# Patient Record
Sex: Female | Born: 1940 | Race: White | Hispanic: No | Marital: Married | Smoking: Former smoker
Health system: Southern US, Community
[De-identification: ages and names within clinical notes are randomized; demographics above are authoritative.]

## PROBLEM LIST (undated history)

## (undated) DIAGNOSIS — I5032 Chronic diastolic (congestive) heart failure: Secondary | ICD-10-CM

## (undated) DIAGNOSIS — K5730 Diverticulosis of large intestine without perforation or abscess without bleeding: Secondary | ICD-10-CM

## (undated) DIAGNOSIS — C50212 Malignant neoplasm of upper-inner quadrant of left female breast: Secondary | ICD-10-CM

## (undated) DIAGNOSIS — I607 Nontraumatic subarachnoid hemorrhage from unspecified intracranial artery: Secondary | ICD-10-CM

## (undated) DIAGNOSIS — I421 Obstructive hypertrophic cardiomyopathy: Secondary | ICD-10-CM

## (undated) DIAGNOSIS — F3289 Other specified depressive episodes: Secondary | ICD-10-CM

## (undated) DIAGNOSIS — C50919 Malignant neoplasm of unspecified site of unspecified female breast: Secondary | ICD-10-CM

## (undated) DIAGNOSIS — F411 Generalized anxiety disorder: Secondary | ICD-10-CM

## (undated) DIAGNOSIS — E782 Mixed hyperlipidemia: Secondary | ICD-10-CM

## (undated) DIAGNOSIS — E119 Type 2 diabetes mellitus without complications: Secondary | ICD-10-CM

## (undated) DIAGNOSIS — N183 Chronic kidney disease, stage 3 unspecified: Secondary | ICD-10-CM

## (undated) DIAGNOSIS — E039 Hypothyroidism, unspecified: Secondary | ICD-10-CM

## (undated) DIAGNOSIS — I671 Cerebral aneurysm, nonruptured: Secondary | ICD-10-CM

## (undated) DIAGNOSIS — K760 Fatty (change of) liver, not elsewhere classified: Secondary | ICD-10-CM

## (undated) DIAGNOSIS — N281 Cyst of kidney, acquired: Secondary | ICD-10-CM

## (undated) DIAGNOSIS — G4733 Obstructive sleep apnea (adult) (pediatric): Secondary | ICD-10-CM

## (undated) DIAGNOSIS — IMO0002 Reserved for concepts with insufficient information to code with codable children: Secondary | ICD-10-CM

## (undated) DIAGNOSIS — E0500 Thyrotoxicosis with diffuse goiter without thyrotoxic crisis or storm: Secondary | ICD-10-CM

## (undated) DIAGNOSIS — R609 Edema, unspecified: Secondary | ICD-10-CM

## (undated) DIAGNOSIS — Z86010 Personal history of colonic polyps: Secondary | ICD-10-CM

## (undated) DIAGNOSIS — R011 Cardiac murmur, unspecified: Secondary | ICD-10-CM

## (undated) DIAGNOSIS — G4730 Sleep apnea, unspecified: Secondary | ICD-10-CM

## (undated) DIAGNOSIS — F329 Major depressive disorder, single episode, unspecified: Secondary | ICD-10-CM

## (undated) DIAGNOSIS — R635 Abnormal weight gain: Secondary | ICD-10-CM

## (undated) DIAGNOSIS — K3189 Other diseases of stomach and duodenum: Secondary | ICD-10-CM

## (undated) DIAGNOSIS — C649 Malignant neoplasm of unspecified kidney, except renal pelvis: Secondary | ICD-10-CM

## (undated) DIAGNOSIS — I10 Essential (primary) hypertension: Secondary | ICD-10-CM

## (undated) DIAGNOSIS — M7120 Synovial cyst of popliteal space [Baker], unspecified knee: Secondary | ICD-10-CM

## (undated) HISTORY — DX: Malignant neoplasm of unspecified kidney, except renal pelvis: C64.9

## (undated) HISTORY — DX: Chronic kidney disease, stage 3 unspecified: N18.30

## (undated) HISTORY — DX: Cyst of kidney, acquired: N28.1

## (undated) HISTORY — DX: Type 2 diabetes mellitus without complications: E11.9

## (undated) HISTORY — PX: CORONARY STENT PLACEMENT: SHX1402

## (undated) HISTORY — PX: BREAST LUMPECTOMY: SHX2

## (undated) HISTORY — DX: Obstructive hypertrophic cardiomyopathy: I42.1

## (undated) HISTORY — PX: ANEURYSM COILING: SHX5349

## (undated) HISTORY — DX: Cardiac murmur, unspecified: R01.1

## (undated) HISTORY — DX: Diverticulosis of large intestine without perforation or abscess without bleeding: K57.30

## (undated) HISTORY — DX: Thyrotoxicosis with diffuse goiter without thyrotoxic crisis or storm: E05.00

## (undated) HISTORY — DX: Mixed hyperlipidemia: E78.2

## (undated) HISTORY — DX: Nontraumatic subarachnoid hemorrhage from unspecified intracranial artery: I60.7

## (undated) HISTORY — DX: Major depressive disorder, single episode, unspecified: F32.9

## (undated) HISTORY — DX: Other specified depressive episodes: F32.89

## (undated) HISTORY — DX: Abnormal weight gain: R63.5

## (undated) HISTORY — PX: COLONOSCOPY: SHX174

## (undated) HISTORY — PX: PARTIAL NEPHRECTOMY: SHX414

## (undated) HISTORY — PX: HEMIARTHROPLASTY SHOULDER FRACTURE: SUR653

## (undated) HISTORY — DX: Malignant neoplasm of unspecified site of unspecified female breast: C50.919

## (undated) HISTORY — DX: Edema, unspecified: R60.9

## (undated) HISTORY — DX: Personal history of colonic polyps: Z86.010

## (undated) HISTORY — DX: Essential (primary) hypertension: I10

## (undated) HISTORY — DX: Generalized anxiety disorder: F41.1

## (undated) HISTORY — DX: Hypothyroidism, unspecified: E03.9

## (undated) HISTORY — PX: SHOULDER SURGERY: SHX246

## (undated) HISTORY — DX: Fatty (change of) liver, not elsewhere classified: K76.0

## (undated) HISTORY — DX: Cerebral aneurysm, nonruptured: I67.1

## (undated) HISTORY — DX: Synovial cyst of popliteal space (Baker), unspecified knee: M71.20

## (undated) HISTORY — DX: Chronic kidney disease, stage 3 (moderate): N18.3

## (undated) HISTORY — DX: Other diseases of stomach and duodenum: K31.89

## (undated) HISTORY — DX: Sleep apnea, unspecified: G47.30

## (undated) HISTORY — DX: Chronic diastolic (congestive) heart failure: I50.32

## (undated) HISTORY — DX: Obstructive sleep apnea (adult) (pediatric): G47.33

## (undated) HISTORY — PX: CATARACT EXTRACTION: SUR2

---

## 1998-09-26 ENCOUNTER — Other Ambulatory Visit: Admission: RE | Admit: 1998-09-26 | Discharge: 1998-09-26 | Payer: Self-pay | Admitting: Obstetrics and Gynecology

## 1999-12-10 DIAGNOSIS — C50919 Malignant neoplasm of unspecified site of unspecified female breast: Secondary | ICD-10-CM

## 1999-12-10 DIAGNOSIS — IMO0001 Reserved for inherently not codable concepts without codable children: Secondary | ICD-10-CM

## 1999-12-10 HISTORY — DX: Malignant neoplasm of unspecified site of unspecified female breast: C50.919

## 1999-12-10 HISTORY — DX: Reserved for inherently not codable concepts without codable children: IMO0001

## 1999-12-10 HISTORY — PX: BREAST EXCISIONAL BIOPSY: SUR124

## 2000-08-22 ENCOUNTER — Encounter: Payer: Self-pay | Admitting: Surgery

## 2000-08-22 ENCOUNTER — Encounter: Admission: RE | Admit: 2000-08-22 | Discharge: 2000-08-22 | Payer: Self-pay | Admitting: Surgery

## 2000-08-25 ENCOUNTER — Encounter: Admission: RE | Admit: 2000-08-25 | Discharge: 2000-11-23 | Payer: Self-pay | Admitting: Radiation Oncology

## 2000-08-28 ENCOUNTER — Encounter (INDEPENDENT_AMBULATORY_CARE_PROVIDER_SITE_OTHER): Payer: Self-pay | Admitting: *Deleted

## 2000-08-28 ENCOUNTER — Encounter: Payer: Self-pay | Admitting: Surgery

## 2000-08-28 ENCOUNTER — Ambulatory Visit (HOSPITAL_COMMUNITY): Admission: RE | Admit: 2000-08-28 | Discharge: 2000-08-28 | Payer: Self-pay | Admitting: Surgery

## 2001-03-13 ENCOUNTER — Encounter: Admission: RE | Admit: 2001-03-13 | Discharge: 2001-06-11 | Payer: Self-pay | Admitting: Radiation Oncology

## 2001-06-16 ENCOUNTER — Ambulatory Visit (HOSPITAL_COMMUNITY): Admission: RE | Admit: 2001-06-16 | Discharge: 2001-06-16 | Payer: Self-pay | Admitting: Radiation Oncology

## 2001-06-16 ENCOUNTER — Encounter: Payer: Self-pay | Admitting: Hematology & Oncology

## 2001-10-12 ENCOUNTER — Encounter: Payer: Self-pay | Admitting: Hematology & Oncology

## 2001-10-12 ENCOUNTER — Encounter: Admission: RE | Admit: 2001-10-12 | Discharge: 2001-10-12 | Payer: Self-pay | Admitting: Hematology & Oncology

## 2002-10-22 DIAGNOSIS — Z860100 Personal history of colon polyps, unspecified: Secondary | ICD-10-CM

## 2002-10-22 HISTORY — DX: Personal history of colon polyps, unspecified: Z86.0100

## 2002-10-22 HISTORY — DX: Personal history of colonic polyps: Z86.010

## 2002-10-24 ENCOUNTER — Inpatient Hospital Stay (HOSPITAL_COMMUNITY): Admission: EM | Admit: 2002-10-24 | Discharge: 2002-10-26 | Payer: Self-pay | Admitting: Emergency Medicine

## 2002-11-01 ENCOUNTER — Encounter: Admission: RE | Admit: 2002-11-01 | Discharge: 2002-11-01 | Payer: Self-pay | Admitting: Hematology & Oncology

## 2002-11-01 ENCOUNTER — Encounter: Payer: Self-pay | Admitting: Hematology & Oncology

## 2006-12-09 DIAGNOSIS — I671 Cerebral aneurysm, nonruptured: Secondary | ICD-10-CM

## 2006-12-09 DIAGNOSIS — I607 Nontraumatic subarachnoid hemorrhage from unspecified intracranial artery: Secondary | ICD-10-CM

## 2006-12-09 DIAGNOSIS — C649 Malignant neoplasm of unspecified kidney, except renal pelvis: Secondary | ICD-10-CM

## 2006-12-09 HISTORY — DX: Malignant neoplasm of unspecified kidney, except renal pelvis: C64.9

## 2006-12-09 HISTORY — DX: Nontraumatic subarachnoid hemorrhage from unspecified intracranial artery: I60.7

## 2006-12-09 HISTORY — DX: Cerebral aneurysm, nonruptured: I67.1

## 2008-08-17 HISTORY — PX: CSF SHUNT: SHX92

## 2009-04-28 ENCOUNTER — Ambulatory Visit: Payer: Self-pay | Admitting: Hematology & Oncology

## 2009-05-13 ENCOUNTER — Emergency Department (HOSPITAL_COMMUNITY): Admission: EM | Admit: 2009-05-13 | Discharge: 2009-05-13 | Payer: Self-pay | Admitting: Emergency Medicine

## 2009-05-22 LAB — CBC WITH DIFFERENTIAL (CANCER CENTER ONLY)
BASO#: 0 10*3/uL (ref 0.0–0.2)
BASO%: 0.5 % (ref 0.0–2.0)
EOS%: 2.3 % (ref 0.0–7.0)
HGB: 11.9 g/dL (ref 11.6–15.9)
MCH: 30.4 pg (ref 26.0–34.0)
MCHC: 33.5 g/dL (ref 32.0–36.0)
MONO%: 6.7 % (ref 0.0–13.0)
NEUT#: 5.7 10*3/uL (ref 1.5–6.5)
RDW: 12.6 % (ref 10.5–14.6)

## 2009-05-23 LAB — COMPREHENSIVE METABOLIC PANEL
Alkaline Phosphatase: 83 U/L (ref 39–117)
BUN: 24 mg/dL — ABNORMAL HIGH (ref 6–23)
Creatinine, Ser: 1.3 mg/dL — ABNORMAL HIGH (ref 0.40–1.20)
Glucose, Bld: 99 mg/dL (ref 70–99)
Total Bilirubin: 0.3 mg/dL (ref 0.3–1.2)

## 2009-05-23 LAB — VITAMIN D 25 HYDROXY (VIT D DEFICIENCY, FRACTURES): Vit D, 25-Hydroxy: 25 ng/mL — ABNORMAL LOW (ref 30–89)

## 2009-06-05 ENCOUNTER — Ambulatory Visit: Payer: Self-pay | Admitting: Radiology

## 2009-06-05 ENCOUNTER — Ambulatory Visit (HOSPITAL_BASED_OUTPATIENT_CLINIC_OR_DEPARTMENT_OTHER): Admission: RE | Admit: 2009-06-05 | Discharge: 2009-06-05 | Payer: Self-pay | Admitting: Hematology & Oncology

## 2009-06-09 ENCOUNTER — Ambulatory Visit (HOSPITAL_COMMUNITY): Admission: RE | Admit: 2009-06-09 | Discharge: 2009-06-09 | Payer: Self-pay | Admitting: Interventional Radiology

## 2009-06-14 ENCOUNTER — Ambulatory Visit (HOSPITAL_COMMUNITY): Admission: RE | Admit: 2009-06-14 | Discharge: 2009-06-14 | Payer: Self-pay | Admitting: Hematology & Oncology

## 2009-06-21 ENCOUNTER — Inpatient Hospital Stay (HOSPITAL_COMMUNITY): Admission: RE | Admit: 2009-06-21 | Discharge: 2009-06-23 | Payer: Self-pay | Admitting: Interventional Radiology

## 2009-07-06 ENCOUNTER — Encounter: Payer: Self-pay | Admitting: Cardiovascular Disease

## 2009-07-06 ENCOUNTER — Encounter: Payer: Self-pay | Admitting: Interventional Radiology

## 2009-07-21 ENCOUNTER — Ambulatory Visit: Payer: Self-pay | Admitting: Hematology & Oncology

## 2009-08-02 ENCOUNTER — Ambulatory Visit: Payer: Self-pay | Admitting: Diagnostic Radiology

## 2009-08-02 ENCOUNTER — Ambulatory Visit (HOSPITAL_BASED_OUTPATIENT_CLINIC_OR_DEPARTMENT_OTHER): Admission: RE | Admit: 2009-08-02 | Discharge: 2009-08-02 | Payer: Self-pay | Admitting: Hematology & Oncology

## 2009-08-16 ENCOUNTER — Encounter: Payer: Self-pay | Admitting: Cardiovascular Disease

## 2009-09-12 DIAGNOSIS — I10 Essential (primary) hypertension: Secondary | ICD-10-CM | POA: Insufficient documentation

## 2009-09-12 DIAGNOSIS — E0500 Thyrotoxicosis with diffuse goiter without thyrotoxic crisis or storm: Secondary | ICD-10-CM | POA: Insufficient documentation

## 2009-09-12 DIAGNOSIS — F329 Major depressive disorder, single episode, unspecified: Secondary | ICD-10-CM

## 2009-09-12 DIAGNOSIS — G43909 Migraine, unspecified, not intractable, without status migrainosus: Secondary | ICD-10-CM | POA: Insufficient documentation

## 2009-09-12 DIAGNOSIS — I671 Cerebral aneurysm, nonruptured: Secondary | ICD-10-CM

## 2009-09-13 ENCOUNTER — Ambulatory Visit: Payer: Self-pay | Admitting: Cardiovascular Disease

## 2009-09-13 DIAGNOSIS — R011 Cardiac murmur, unspecified: Secondary | ICD-10-CM

## 2009-09-20 ENCOUNTER — Ambulatory Visit (HOSPITAL_COMMUNITY): Admission: RE | Admit: 2009-09-20 | Discharge: 2009-09-20 | Payer: Self-pay | Admitting: Interventional Radiology

## 2009-09-25 ENCOUNTER — Ambulatory Visit (HOSPITAL_COMMUNITY): Admission: RE | Admit: 2009-09-25 | Discharge: 2009-09-25 | Payer: Self-pay | Admitting: Internal Medicine

## 2009-09-25 ENCOUNTER — Ambulatory Visit: Payer: Self-pay

## 2009-09-25 ENCOUNTER — Encounter: Payer: Self-pay | Admitting: Cardiovascular Disease

## 2009-09-25 ENCOUNTER — Ambulatory Visit: Payer: Self-pay | Admitting: Internal Medicine

## 2009-10-09 ENCOUNTER — Ambulatory Visit (HOSPITAL_COMMUNITY): Admission: RE | Admit: 2009-10-09 | Discharge: 2009-10-09 | Payer: Self-pay | Admitting: Interventional Radiology

## 2009-10-16 ENCOUNTER — Encounter: Payer: Self-pay | Admitting: Cardiovascular Disease

## 2009-10-20 ENCOUNTER — Ambulatory Visit: Payer: Self-pay | Admitting: Hematology & Oncology

## 2009-10-23 LAB — CBC WITH DIFFERENTIAL (CANCER CENTER ONLY)
BASO#: 0 10*3/uL (ref 0.0–0.2)
BASO%: 0.4 % (ref 0.0–2.0)
EOS%: 2.5 % (ref 0.0–7.0)
HCT: 35.7 % (ref 34.8–46.6)
HGB: 12.4 g/dL (ref 11.6–15.9)
LYMPH#: 1.4 10*3/uL (ref 0.9–3.3)
LYMPH%: 24.1 % (ref 14.0–48.0)
MCH: 30.3 pg (ref 26.0–34.0)
MCHC: 34.7 g/dL (ref 32.0–36.0)
MCV: 87 fL (ref 81–101)
MONO%: 4.8 % (ref 0.0–13.0)
NEUT%: 68.2 % (ref 39.6–80.0)
RDW: 12.5 % (ref 10.5–14.6)

## 2009-10-24 LAB — COMPREHENSIVE METABOLIC PANEL
AST: 10 U/L (ref 0–37)
Albumin: 4 g/dL (ref 3.5–5.2)
Alkaline Phosphatase: 68 U/L (ref 39–117)
Glucose, Bld: 149 mg/dL — ABNORMAL HIGH (ref 70–99)
Potassium: 4.2 mEq/L (ref 3.5–5.3)
Sodium: 138 mEq/L (ref 135–145)
Total Protein: 6.3 g/dL (ref 6.0–8.3)

## 2009-11-03 ENCOUNTER — Encounter: Payer: Self-pay | Admitting: Cardiovascular Disease

## 2009-11-20 ENCOUNTER — Ambulatory Visit (HOSPITAL_COMMUNITY): Admission: RE | Admit: 2009-11-20 | Discharge: 2009-11-20 | Payer: Self-pay | Admitting: Interventional Radiology

## 2009-12-09 HISTORY — PX: FEMORAL ARTERY REPAIR: SHX1582

## 2009-12-18 ENCOUNTER — Inpatient Hospital Stay (HOSPITAL_COMMUNITY): Admission: RE | Admit: 2009-12-18 | Discharge: 2009-12-19 | Payer: Self-pay | Admitting: Interventional Radiology

## 2010-01-01 ENCOUNTER — Encounter: Payer: Self-pay | Admitting: Interventional Radiology

## 2010-01-12 ENCOUNTER — Ambulatory Visit: Payer: Self-pay | Admitting: Hematology & Oncology

## 2010-03-09 ENCOUNTER — Ambulatory Visit: Payer: Self-pay | Admitting: Hematology & Oncology

## 2010-03-12 LAB — CBC WITH DIFFERENTIAL (CANCER CENTER ONLY)
BASO#: 0 10*3/uL (ref 0.0–0.2)
BASO%: 0.4 % (ref 0.0–2.0)
Eosinophils Absolute: 0.2 10*3/uL (ref 0.0–0.5)
HCT: 39.1 % (ref 34.8–46.6)
HGB: 12.8 g/dL (ref 11.6–15.9)
LYMPH#: 1.4 10*3/uL (ref 0.9–3.3)
LYMPH%: 19.6 % (ref 14.0–48.0)
MCV: 89 fL (ref 81–101)
MONO#: 0.4 10*3/uL (ref 0.1–0.9)
NEUT%: 71.4 % (ref 39.6–80.0)
WBC: 7.1 10*3/uL (ref 3.9–10.0)

## 2010-03-12 LAB — COMPREHENSIVE METABOLIC PANEL
ALT: 10 U/L (ref 0–35)
BUN: 29 mg/dL — ABNORMAL HIGH (ref 6–23)
CO2: 25 mEq/L (ref 19–32)
Calcium: 9.4 mg/dL (ref 8.4–10.5)
Chloride: 103 mEq/L (ref 96–112)
Creatinine, Ser: 1.16 mg/dL (ref 0.40–1.20)
Glucose, Bld: 96 mg/dL (ref 70–99)
Total Bilirubin: 0.3 mg/dL (ref 0.3–1.2)

## 2010-03-12 LAB — VITAMIN D 25 HYDROXY (VIT D DEFICIENCY, FRACTURES): Vit D, 25-Hydroxy: 39 ng/mL (ref 30–89)

## 2010-03-26 ENCOUNTER — Ambulatory Visit (HOSPITAL_COMMUNITY): Admission: RE | Admit: 2010-03-26 | Discharge: 2010-03-26 | Payer: Self-pay | Admitting: Interventional Radiology

## 2010-03-27 ENCOUNTER — Ambulatory Visit (HOSPITAL_COMMUNITY)
Admission: RE | Admit: 2010-03-27 | Discharge: 2010-03-27 | Payer: Self-pay | Source: Home / Self Care | Admitting: Interventional Radiology

## 2010-03-27 ENCOUNTER — Ambulatory Visit: Admission: RE | Admit: 2010-03-27 | Discharge: 2010-03-27 | Payer: Self-pay | Admitting: Interventional Radiology

## 2010-03-27 ENCOUNTER — Encounter (INDEPENDENT_AMBULATORY_CARE_PROVIDER_SITE_OTHER): Payer: Self-pay | Admitting: Interventional Radiology

## 2010-03-27 ENCOUNTER — Ambulatory Visit: Payer: Self-pay | Admitting: Vascular Surgery

## 2010-03-29 ENCOUNTER — Ambulatory Visit (HOSPITAL_COMMUNITY)
Admission: RE | Admit: 2010-03-29 | Discharge: 2010-03-29 | Payer: Self-pay | Source: Home / Self Care | Admitting: Interventional Radiology

## 2010-03-29 ENCOUNTER — Encounter (INDEPENDENT_AMBULATORY_CARE_PROVIDER_SITE_OTHER): Payer: Self-pay | Admitting: Interventional Radiology

## 2010-04-03 ENCOUNTER — Inpatient Hospital Stay (HOSPITAL_COMMUNITY): Admission: AD | Admit: 2010-04-03 | Discharge: 2010-04-09 | Payer: Self-pay | Admitting: Vascular Surgery

## 2010-04-05 ENCOUNTER — Encounter: Payer: Self-pay | Admitting: Vascular Surgery

## 2010-04-11 ENCOUNTER — Ambulatory Visit: Payer: Self-pay | Admitting: Vascular Surgery

## 2010-04-19 ENCOUNTER — Ambulatory Visit: Payer: Self-pay | Admitting: Vascular Surgery

## 2010-08-14 ENCOUNTER — Ambulatory Visit (HOSPITAL_BASED_OUTPATIENT_CLINIC_OR_DEPARTMENT_OTHER): Payer: Medicare Other | Admitting: Hematology & Oncology

## 2010-08-22 ENCOUNTER — Ambulatory Visit (HOSPITAL_BASED_OUTPATIENT_CLINIC_OR_DEPARTMENT_OTHER): Admission: RE | Admit: 2010-08-22 | Discharge: 2010-08-22 | Payer: Self-pay | Admitting: Hematology & Oncology

## 2010-08-22 ENCOUNTER — Ambulatory Visit: Payer: Self-pay | Admitting: Diagnostic Radiology

## 2010-09-20 ENCOUNTER — Encounter: Admission: RE | Admit: 2010-09-20 | Discharge: 2010-09-20 | Payer: Self-pay | Admitting: Internal Medicine

## 2010-09-26 ENCOUNTER — Encounter: Admission: RE | Admit: 2010-09-26 | Payer: Self-pay | Admitting: Internal Medicine

## 2010-12-29 ENCOUNTER — Encounter: Payer: Self-pay | Admitting: *Deleted

## 2010-12-30 ENCOUNTER — Encounter: Payer: Self-pay | Admitting: Interventional Radiology

## 2011-02-14 ENCOUNTER — Encounter (HOSPITAL_BASED_OUTPATIENT_CLINIC_OR_DEPARTMENT_OTHER): Payer: Medicare Other | Admitting: Hematology & Oncology

## 2011-02-14 DIAGNOSIS — Z853 Personal history of malignant neoplasm of breast: Secondary | ICD-10-CM

## 2011-02-14 DIAGNOSIS — Z85528 Personal history of other malignant neoplasm of kidney: Secondary | ICD-10-CM

## 2011-02-14 DIAGNOSIS — C649 Malignant neoplasm of unspecified kidney, except renal pelvis: Secondary | ICD-10-CM

## 2011-02-14 DIAGNOSIS — E559 Vitamin D deficiency, unspecified: Secondary | ICD-10-CM

## 2011-02-14 LAB — CBC WITH DIFFERENTIAL (CANCER CENTER ONLY)
BASO#: 0 10*3/uL (ref 0.0–0.2)
BASO%: 0.5 % (ref 0.0–2.0)
EOS%: 1.5 % (ref 0.0–7.0)
HGB: 12.4 g/dL (ref 11.6–15.9)
MCH: 28.1 pg (ref 26.0–34.0)
MCHC: 33.2 g/dL (ref 32.0–36.0)
MONO%: 6.3 % (ref 0.0–13.0)
NEUT#: 6.1 10*3/uL (ref 1.5–6.5)
NEUT%: 75.8 % (ref 39.6–80.0)
RDW: 15.6 % (ref 11.1–15.7)

## 2011-02-15 LAB — COMPREHENSIVE METABOLIC PANEL
ALT: 11 U/L (ref 0–35)
AST: 14 U/L (ref 0–37)
Calcium: 9 mg/dL (ref 8.4–10.5)
Chloride: 106 mEq/L (ref 96–112)
Creatinine, Ser: 1.28 mg/dL — ABNORMAL HIGH (ref 0.40–1.20)
Sodium: 141 mEq/L (ref 135–145)
Total Protein: 6.4 g/dL (ref 6.0–8.3)

## 2011-02-23 LAB — COMPREHENSIVE METABOLIC PANEL
BUN: 20 mg/dL (ref 6–23)
CO2: 25 mEq/L (ref 19–32)
Calcium: 9.1 mg/dL (ref 8.4–10.5)
Creatinine, Ser: 1.11 mg/dL (ref 0.4–1.2)
GFR calc non Af Amer: 49 mL/min — ABNORMAL LOW (ref >60)
Glucose, Bld: 125 mg/dL — ABNORMAL HIGH (ref 70–99)

## 2011-02-23 LAB — CBC
HCT: 36 % (ref 36.0–46.0)
Hemoglobin: 9.8 g/dL — ABNORMAL LOW (ref 12.0–15.0)
MCHC: 34.5 g/dL (ref 30.0–36.0)
MCHC: 34.5 g/dL (ref 30.0–36.0)
MCV: 90.7 fL (ref 78.0–100.0)
MCV: 91 fL (ref 78.0–100.0)
Platelets: 166 10*3/uL (ref 150–400)
Platelets: 204 10*3/uL (ref 150–400)
RBC: 2.98 MIL/uL — ABNORMAL LOW (ref 3.87–5.11)
RBC: 3.15 MIL/uL — ABNORMAL LOW (ref 3.87–5.11)
RDW: 15.8 % — ABNORMAL HIGH (ref 11.5–15.5)
RDW: 15.8 % — ABNORMAL HIGH (ref 11.5–15.5)
WBC: 8.9 10*3/uL (ref 4.0–10.5)

## 2011-02-23 LAB — DIFFERENTIAL
Basophils Absolute: 0 10*3/uL (ref 0.0–0.1)
Basophils Relative: 1 % (ref 0–1)
Eosinophils Absolute: 0.2 10*3/uL (ref 0.0–0.7)
Eosinophils Relative: 3 % (ref 0–5)
Lymphocytes Relative: 16 % (ref 12–46)
Monocytes Absolute: 0.5 10*3/uL (ref 0.1–1.0)
Monocytes Relative: 6 % (ref 3–12)
Neutro Abs: 6.7 10*3/uL (ref 1.7–7.7)
Neutrophils Relative %: 76 % (ref 43–77)

## 2011-02-23 LAB — BASIC METABOLIC PANEL
BUN: 22 mg/dL (ref 6–23)
BUN: 29 mg/dL — ABNORMAL HIGH (ref 6–23)
CO2: 25 mEq/L (ref 19–32)
CO2: 26 mEq/L (ref 19–32)
Calcium: 8.2 mg/dL — ABNORMAL LOW (ref 8.4–10.5)
Calcium: 8.3 mg/dL — ABNORMAL LOW (ref 8.4–10.5)
Chloride: 104 mEq/L (ref 96–112)
Chloride: 106 mEq/L (ref 96–112)
Creatinine, Ser: 1.24 mg/dL — ABNORMAL HIGH (ref 0.4–1.2)
Creatinine, Ser: 1.47 mg/dL — ABNORMAL HIGH (ref 0.4–1.2)
Creatinine, Ser: 1.67 mg/dL — ABNORMAL HIGH (ref 0.4–1.2)
GFR calc Af Amer: 37 mL/min — ABNORMAL LOW (ref >60)
GFR calc Af Amer: 43 mL/min — ABNORMAL LOW (ref >60)
GFR calc non Af Amer: 36 mL/min — ABNORMAL LOW (ref >60)
GFR calc non Af Amer: 43 mL/min — ABNORMAL LOW (ref >60)
Glucose, Bld: 113 mg/dL — ABNORMAL HIGH (ref 70–99)
Potassium: 4.4 mEq/L (ref 3.5–5.1)
Sodium: 136 mEq/L (ref 135–145)

## 2011-02-23 LAB — PROTIME-INR: Prothrombin Time: 12.2 seconds (ref 11.6–15.2)

## 2011-02-26 LAB — CBC
HCT: 21.8 % — ABNORMAL LOW (ref 36.0–46.0)
HCT: 23.1 % — ABNORMAL LOW (ref 36.0–46.0)
HCT: 24.7 % — ABNORMAL LOW (ref 36.0–46.0)
HCT: 33.3 % — ABNORMAL LOW (ref 36.0–46.0)
HCT: 36.8 % (ref 36.0–46.0)
Hemoglobin: 7.4 g/dL — ABNORMAL LOW (ref 12.0–15.0)
Hemoglobin: 11.6 g/dL — ABNORMAL LOW (ref 12.0–15.0)
Hemoglobin: 12.6 g/dL (ref 12.0–15.0)
Hemoglobin: 7.9 g/dL — ABNORMAL LOW (ref 12.0–15.0)
MCHC: 33.8 g/dL (ref 30.0–36.0)
MCHC: 34.3 g/dL (ref 30.0–36.0)
MCHC: 34.3 g/dL (ref 30.0–36.0)
MCHC: 34.9 g/dL (ref 30.0–36.0)
MCV: 90.2 fL (ref 78.0–100.0)
Platelets: 168 10*3/uL (ref 150–400)
Platelets: 197 10*3/uL (ref 150–400)
Platelets: 249 10*3/uL (ref 150–400)
Platelets: 258 10*3/uL (ref 150–400)
Platelets: 262 10*3/uL (ref 150–400)
RBC: 2.38 MIL/uL — ABNORMAL LOW (ref 3.87–5.11)
RDW: 16.1 % — ABNORMAL HIGH (ref 11.5–15.5)
RDW: 14.9 % (ref 11.5–15.5)
RDW: 15.1 % (ref 11.5–15.5)
RDW: 15.4 % (ref 11.5–15.5)
RDW: 15.9 % — ABNORMAL HIGH (ref 11.5–15.5)
RDW: 16.1 % — ABNORMAL HIGH (ref 11.5–15.5)
WBC: 10.7 10*3/uL — ABNORMAL HIGH (ref 4.0–10.5)
WBC: 9.2 10*3/uL (ref 4.0–10.5)

## 2011-02-26 LAB — URINALYSIS, ROUTINE W REFLEX MICROSCOPIC
Glucose, UA: NEGATIVE mg/dL
Leukocytes, UA: NEGATIVE
Nitrite: NEGATIVE
Specific Gravity, Urine: 1.018 (ref 1.005–1.030)
pH: 6 (ref 5.0–8.0)

## 2011-02-26 LAB — BASIC METABOLIC PANEL
BUN: 16 mg/dL (ref 6–23)
BUN: 19 mg/dL (ref 6–23)
BUN: 21 mg/dL (ref 6–23)
CO2: 25 mEq/L (ref 19–32)
CO2: 28 mEq/L (ref 19–32)
Calcium: 8.8 mg/dL (ref 8.4–10.5)
Calcium: 8.9 mg/dL (ref 8.4–10.5)
Chloride: 105 mEq/L (ref 96–112)
Creatinine, Ser: 1.13 mg/dL (ref 0.4–1.2)
GFR calc non Af Amer: 48 mL/min — ABNORMAL LOW (ref >60)
Glucose, Bld: 108 mg/dL — ABNORMAL HIGH (ref 70–99)
Glucose, Bld: 112 mg/dL — ABNORMAL HIGH (ref 70–99)
Glucose, Bld: 120 mg/dL — ABNORMAL HIGH (ref 70–99)
Potassium: 4 mEq/L (ref 3.5–5.1)
Potassium: 4 mEq/L (ref 3.5–5.1)
Potassium: 4.2 mEq/L (ref 3.5–5.1)
Sodium: 136 mEq/L (ref 135–145)
Sodium: 141 mEq/L (ref 135–145)

## 2011-02-26 LAB — URINE MICROSCOPIC-ADD ON

## 2011-02-26 LAB — CROSSMATCH: Antibody Screen: NEGATIVE

## 2011-02-26 LAB — PROTIME-INR: INR: 1.02 (ref 0.00–1.49)

## 2011-02-26 LAB — CARDIAC PANEL(CRET KIN+CKTOT+MB+TROPI)
CK, MB: 2.2 ng/mL (ref 0.3–4.0)
Troponin I: 0.02 ng/mL (ref 0.00–0.06)

## 2011-02-26 LAB — COMPREHENSIVE METABOLIC PANEL
AST: 19 U/L (ref 0–37)
Albumin: 3.1 g/dL — ABNORMAL LOW (ref 3.5–5.2)
Alkaline Phosphatase: 137 U/L — ABNORMAL HIGH (ref 39–117)
Chloride: 104 mEq/L (ref 96–112)
GFR calc Af Amer: >60 mL/min (ref >60)
Potassium: 3.9 mEq/L (ref 3.5–5.1)
Total Bilirubin: 1 mg/dL (ref 0.3–1.2)
Total Protein: 6.4 g/dL (ref 6.0–8.3)

## 2011-02-26 LAB — APTT: aPTT: 30 seconds (ref 24–37)

## 2011-03-12 LAB — CREATININE, SERUM
Creatinine, Ser: 1.39 mg/dL — ABNORMAL HIGH (ref 0.4–1.2)
GFR calc Af Amer: 46 mL/min — ABNORMAL LOW (ref >60)
GFR calc non Af Amer: 38 mL/min — ABNORMAL LOW (ref >60)

## 2011-03-12 LAB — POCT I-STAT, CHEM 8
Chloride: 107 mEq/L (ref 96–112)
Creatinine, Ser: 1.5 mg/dL — ABNORMAL HIGH (ref 0.4–1.2)
Hemoglobin: 13.3 g/dL (ref 12.0–15.0)
Potassium: 4.4 mEq/L (ref 3.5–5.1)
Sodium: 139 mEq/L (ref 135–145)

## 2011-03-12 LAB — BASIC METABOLIC PANEL
Chloride: 101 mEq/L (ref 96–112)
GFR calc Af Amer: 50 mL/min — ABNORMAL LOW (ref >60)
GFR calc non Af Amer: 41 mL/min — ABNORMAL LOW (ref >60)
Potassium: 4.4 mEq/L (ref 3.5–5.1)
Sodium: 135 mEq/L (ref 135–145)

## 2011-03-12 LAB — CBC
HCT: 37.2 % (ref 36.0–46.0)
Hemoglobin: 12.8 g/dL (ref 12.0–15.0)
MCV: 90.7 fL (ref 78.0–100.0)
Platelets: 219 10*3/uL (ref 150–400)
RBC: 4.09 MIL/uL (ref 3.87–5.11)
WBC: 6.6 10*3/uL (ref 4.0–10.5)

## 2011-03-12 LAB — DIFFERENTIAL
Eosinophils Relative: 3 % (ref 0–5)
Lymphocytes Relative: 20 % (ref 12–46)
Lymphs Abs: 1.3 10*3/uL (ref 0.7–4.0)
Monocytes Absolute: 0.4 10*3/uL (ref 0.1–1.0)
Monocytes Relative: 6 % (ref 3–12)

## 2011-03-12 LAB — PROTIME-INR: Prothrombin Time: 12.4 seconds (ref 11.6–15.2)

## 2011-03-14 LAB — BASIC METABOLIC PANEL
BUN: 22 mg/dL (ref 6–23)
Calcium: 9.6 mg/dL (ref 8.4–10.5)
Calcium: 9.4 mg/dL (ref 8.4–10.5)
Creatinine, Ser: 1.37 mg/dL — ABNORMAL HIGH (ref 0.4–1.2)
GFR calc Af Amer: 47 mL/min — ABNORMAL LOW (ref >60)
GFR calc non Af Amer: 42 mL/min — ABNORMAL LOW (ref >60)
GFR calc non Af Amer: 39 mL/min — ABNORMAL LOW (ref >60)
Glucose, Bld: 106 mg/dL — ABNORMAL HIGH (ref 70–99)
Sodium: 140 mEq/L (ref 135–145)

## 2011-03-14 LAB — DIFFERENTIAL
Basophils Absolute: 0 10*3/uL (ref 0.0–0.1)
Lymphocytes Relative: 20 % (ref 12–46)
Neutro Abs: 5.7 10*3/uL (ref 1.7–7.7)

## 2011-03-14 LAB — CBC
MCHC: 34 g/dL (ref 30.0–36.0)
Platelets: 202 10*3/uL (ref 150–400)
RBC: 4.03 MIL/uL (ref 3.87–5.11)
RDW: 14.7 % (ref 11.5–15.5)
WBC: 7.9 10*3/uL (ref 4.0–10.5)
WBC: 8.4 10*3/uL (ref 4.0–10.5)

## 2011-03-14 LAB — PROTIME-INR
INR: 0.92 (ref 0.00–1.49)
INR: 0.82 (ref 0.00–1.49)
Prothrombin Time: 12.3 seconds (ref 11.6–15.2)
Prothrombin Time: 11.2 seconds — ABNORMAL LOW (ref 11.6–15.2)

## 2011-03-14 LAB — APTT: aPTT: 29 seconds (ref 24–37)

## 2011-03-17 LAB — PROTIME-INR
INR: 0.9 (ref 0.00–1.49)
INR: 0.9 (ref 0.00–1.49)
INR: 1 (ref 0.00–1.49)
Prothrombin Time: 11.7 seconds (ref 11.6–15.2)
Prothrombin Time: 12.8 seconds (ref 11.6–15.2)

## 2011-03-17 LAB — BASIC METABOLIC PANEL
BUN: 18 mg/dL (ref 6–23)
CO2: 27 mEq/L (ref 19–32)
Calcium: 10.2 mg/dL (ref 8.4–10.5)
Calcium: 8.6 mg/dL (ref 8.4–10.5)
Calcium: 9.3 mg/dL (ref 8.4–10.5)
Creatinine, Ser: 0.92 mg/dL (ref 0.4–1.2)
Creatinine, Ser: 1.14 mg/dL (ref 0.4–1.2)
GFR calc Af Amer: 53 mL/min — ABNORMAL LOW (ref >60)
GFR calc Af Amer: >60 mL/min (ref >60)
GFR calc non Af Amer: 44 mL/min — ABNORMAL LOW (ref >60)
GFR calc non Af Amer: 48 mL/min — ABNORMAL LOW (ref >60)
GFR calc non Af Amer: >60 mL/min (ref >60)
Glucose, Bld: 105 mg/dL — ABNORMAL HIGH (ref 70–99)
Potassium: 4.8 mEq/L (ref 3.5–5.1)
Sodium: 138 mEq/L (ref 135–145)
Sodium: 140 mEq/L (ref 135–145)

## 2011-03-17 LAB — DIFFERENTIAL
Lymphocytes Relative: 17 % (ref 12–46)
Lymphs Abs: 1.5 10*3/uL (ref 0.7–4.0)
Neutro Abs: 6.3 10*3/uL (ref 1.7–7.7)
Neutrophils Relative %: 75 % (ref 43–77)

## 2011-03-17 LAB — CBC
HCT: 41 % (ref 36.0–46.0)
Hemoglobin: 11.3 g/dL — ABNORMAL LOW (ref 12.0–15.0)
Hemoglobin: 13.3 g/dL (ref 12.0–15.0)
MCHC: 34 g/dL (ref 30.0–36.0)
MCHC: 34.7 g/dL (ref 30.0–36.0)
Platelets: 181 10*3/uL (ref 150–400)
Platelets: 202 10*3/uL (ref 150–400)
RBC: 3.76 MIL/uL — ABNORMAL LOW (ref 3.87–5.11)
RDW: 15.2 % (ref 11.5–15.5)
RDW: 15.5 % (ref 11.5–15.5)
WBC: 8.4 10*3/uL (ref 4.0–10.5)

## 2011-03-17 LAB — HEPARIN LEVEL (UNFRACTIONATED)
Heparin Unfractionated: 0.26 IU/mL — ABNORMAL LOW (ref 0.30–0.70)
Heparin Unfractionated: 0.27 IU/mL — ABNORMAL LOW (ref 0.30–0.70)

## 2011-03-17 LAB — APTT: aPTT: 28 seconds (ref 24–37)

## 2011-04-23 NOTE — H&P (Signed)
HISTORY AND PHYSICAL EXAMINATION   April 03, 2010   Re:  Cassidy Ortiz, DEVOY                  DOB:  06/10/1941   CHIEF COMPLAINT:  Worsening right thigh pain x4 days.   HISTORY OF PRESENT ILLNESS:  Patient is a 70 year old Caucasian female  who has a previous history of ruptured right internal carotid artery  aneurysm that was coiled at [facility] in 2008.  She had a  stent placement for neck remnant and a previously right peri-ophthalmic  artery aneurysm performed in July 2010 by Dr. Estanislado Pandy.  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  She had a routine follow-up study via right  groin catheterization by Dr. Estanislado Pandy on 03/26/2010.  The following  day, she presented with acute onset of right groin pain.  She underwent  a duplex scan which suggested a small pseudoaneurysm of the right groin  and possible fistula as well.  She was seen by on-call vascular surgeon  Dr. Deitra Mayo at that time.  He ordered a CT scan which ruled  out retroperitoneal bleed.  He did not feel this scan showed a definite  arteriovenous fistula but did want the right femoral pseudoaneurysm  followed.  He recommended that she could be discharged with outpatient  ultrasound followup.  This was done on 03/29/2010.  Ultrasound at that  time showed that the pseudoaneurysm had not thrombosed, and the neck now  measured 5.9 mm up from 4 mm.  Of note, she did have a known right thigh  hematoma following the catheterization.  She was to be followed on an  outpatient basis for her femoral pseudoaneurysm.  Dr. Scot Dock was hoping  to avoid surgical repair due to her obesity and risk for wound  complications.  She however called the [facility] office on April 25 with  reports of worsening right thigh and groin pain.  She was told to come  to our office today, April 26, for reevaluation.   For past medical history, family and  social history, and comprehensive  physical examination findings, please see Dr. Nicole Cella consultation  note job number [id number] from 03/29/2010.   ALLERGIES:  She has no known drug allergies.   MEDICATIONS:  Losartan 50 mg p.o. q.p.m., carvedilol 3.125 mg p.o.  b.i.d., Diltiazem 120 mg p.o. b.i.d., hydralazine 75 mg p.o. b.i.d.  Prozac 40 mg p.o. b.i.d., simvastatin 20 mg p.o. q.p.m., Synthroid 0.125  mg p.o. q.a.m., trazodone 100 mg p.o. q.p.m., aspirin 81 mg 2 tablets  p.o. daily but has been on hold since developing a right groin hematoma,  calcium 1000 mg daily, vitamin D 2000 mg daily, multivitamin daily, and  Ambien 10 mg p.o. q.h.s. p.r.n. for sleep.   REVIEW OF SYSTEMS:  Please refer to Dr. Nicole Cella previous consult note.  Today she also reports a history of weight gain, renal cancer, pain in  her right leg, headaches, and depression, otherwise negative.   DIAGNOSTIC STUDIES:  Today, she had a right groin ultrasound to evaluate  the pseudoaneurysm.  This was also reviewed by Dr. Tinnie Gens.  She  was noted to have a right groin pseudoaneurysm with a neck measuring 0.4  cm wide.  Pseudoaneurysm measured 1.1 cm x 1 cm.  There was a huge  hematoma noted on the right upper thigh.   ASSESSMENT AND PLAN:  Patient presents with worsening right leg pain, so  much that it is limiting her mobility.  She also presents with right  thigh hematoma which she says has worsened since the initial insult, and  even more so in the last 24 hours.  She denies being on any blood  thinners.  Although the right groin pseudoaneurysm remained relatively  stable, it is still open and with her increasing pain and subjectively  increased size of her right thigh hematoma, Dr. Kellie Simmering who saw her  today, feels she would benefit from admission to [facility] for  pain control and then operative management on 04/04/2010 by Dr. Curt Jews.  She will undergo evacuation of her right groin and thigh   hematoma and repair of her right femoral pseudoaneurysm.  She will be  admitted to telemetry unit 2000.  We will place a Foley catheter, as her  mobility is quite limited at this time.  She will be given morphine and  Percocet p.r.n. for pain.  We will also get preoperative labs on  admission and treat any anemia or coagulopathy if indicated.  Dr. Kellie Simmering  has discussed the procedure with the patient and her husband.  They have  agreed to proceed.  The patient will also meet with Dr. Donnetta Hutching tomorrow  prior to surgery.   Jacinta Shoe, PA   Nelda Severe. Kellie Simmering, M.D.  Electronically Signed   AWZ/MEDQ  D:  04/03/2010  T:  04/03/2010  Job:  [id number]

## 2011-04-23 NOTE — Assessment & Plan Note (Signed)
OFFICE VISIT   Nuckles, Quin  DOB:  04/04/1941                                       04/19/2010  CHART#:[id number]   I saw the patient in the office today for followup after she had  recently had evacuation of a hematoma of the right groin by Dr. Donnetta Hutching on  April 03, 2010.  She had undergone a catheterization via the right groin  by Dr. Estanislado Pandy to follow up previous coiling of an intracranial  aneurysm.  She developed a hematoma in the right groin and was having  persistent pain, and therefore Dr. Donnetta Hutching explored this and evacuated  hematoma.  She was sent for followup of her wound.  She has some  moderate discomfort in her right groin, but this is gradually improving.  Otherwise, she is doing well.   On examination, her incision is healing well.  There was 1 small suture  protruding through the skin, which I removed.  There is no erythema or  drainage.  I instructed her to keep the groin incision clean and dry.  We will see her back p.r.n.     Judeth Cornfield. Scot Dock, M.D.  Electronically Signed   CSD/MEDQ  D:  04/19/2010  T:  04/20/2010  Job:  [id number]

## 2011-04-23 NOTE — Discharge Summary (Signed)
NAMEAMARI, HSUEH                ACCOUNT NO.:  [id number]   MEDICAL RECORD NO.:  [id number]          PATIENT TYPE:  INP   LOCATION:  3109                         FACILITY:  [facility]   PHYSICIAN:  Sanjeev K. Deveshwar, M.D.DATE OF BIRTH:  [birth year]   DATE OF ADMISSION:  06/21/2009  DATE OF DISCHARGE:  06/23/2009                               DISCHARGE SUMMARY   BRIEF HISTORY:  This is a pleasant 70 year old female who was referred  to Dr. Estanislado Pandy through the courtesy of Dr. Marin Olp.  Dr. Estanislado Pandy saw  the patient in consultation on June 05, 2009, for evaluation of cerebral  aneurysms.  The patient had a right internal carotid artery aneurysm,  which ruptured in December 2008 while she and her husband were living in  [place], and she was taken to the [facility] where  she underwent coiling of a ruptured right internal carotid artery  aneurysm on November 24, 2007, performed by Dr. Patrick North.   The patient did remarkably well after her coiling.  Unfortunately, she  was found to have renal cancer and underwent a nephrectomy shortly after  the coiling.  A followup angiogram in September 2009 revealed a neck  remnant in the previously coiled aneurysm.  Plans were made for further  treatment in the future; however, in the meantime, the patient and her  husband moved to [place].  The patient was seen at [facility] Emergency Department on May 13, 2009, with a severe headache  reminiscent of the headache associated with her ruptured aneurysm.  A CT  scan was performed that showed no acute abnormality.   Dr. Estanislado Pandy met with the patient and her husband on June 05, 2009.  At  that time, treatment options were discussed and Dr. Estanislado Pandy  recommended a cerebral angiogram for further evaluation.  The decision  was made to admit the patient to [facility] on June 21, 2009,  for the angiogram and possible intervention if felt to be safe and   indicated.   PAST MEDICAL HISTORY:  Significant for severe hypertension.  The patient  had breast cancer in 2001, this was treated with surgery and radiation.  She had the ruptured aneurysm coiled in December 2008.  The patient had  a ventriculoperitoneal shunt placed during that admission.  She has a  history of Graves disease, which was treated with iodine.  She had renal  cancer and is status post partial nephrectomy.  She has a history of  migraine headaches.  The patient is status post polypectomy, performed  by [facility].  She has a history of depression.  She has a pre-  melanomatous lesion on her abdomen that is being evaluated by  dermatologist.  They were recently concerned that the patient had  developed cancer in her left kidney.  She had previously had cancer in  her right kidney.  Apparently, she was told that she did not in fact  have a new cancer in her left kidney.   SURGICAL HISTORY:  Significant for polypectomy, partial right  nephrectomy, breast surgery with lumpectomy, previous shoulder  surgeries, previous VP shunt, as well as cataract surgery.   MEDICATIONS AT THE TIME OF ADMISSION:  Prozac 40 mg daily; Maxzide  37.5/25 one daily; Clonidine 0.1 mg, frequency unavailable; diltiazem,  dose is not available; Synthroid, dose is not available; Avapro;  trazodone; hydralazine; aspirin; and Plavix.  The Plavix had been  started in anticipation of possible stent placement.   ALLERGIES:  No known drug allergies.   SOCIAL HISTORY:  The patient is married.  She and her husband have 3  grown children.  The patient and her husband now live in [place].  They had previously lived in [place].  The patient quit smoking  at the time of her aneurysm rupture.  She previously smoked at least a  pack of cigarettes per day for greater than 40 years.  She drinks wine  occasionally.  She is a retired Facilities manager.  Unfortunately, her husband continues to  smoke.   FAMILY HISTORY:  Her mother is alive at age 34.  She has a permanent  pacemaker.  Her father died in his 34s from an [place]:  The patient was admitted to [facility] on  June 21, 2009, to undergo further treatment of a previously ruptured  right internal carotid artery cerebral aneurysm.  The patient recently  had a cerebral angiogram, performed by Dr. Estanislado Pandy on June 09, 2009.  This showed a 6 mm x 3.5 mm neck remnant of the previously  endovascularly treated ruptured right periophthalmic region aneurysm.  She had a 3 mm x 2 mm distal basilar side wall aneurysm at the level of  the left superior cerebellar artery.  There was also a 2.5-mm x 2-mm  left internal carotid artery superior hypophyseal region aneurysm.   On June 21, 2009, the patient underwent stent placement for the neck  remnant of the previously coiled right periophthalmic artery aneurysm,  performed by Dr. Estanislado Pandy under general anesthesia.  The patient  tolerated the procedure well.  She was admitted to the neuro intensive  care unit where she remained on IV heparin overnight per Dr. Arlean Hopping  protocol.  The following day, the right femoral groin sheath was  removed.  The plans were to ambulate the patient and if she remains  stable to discharge the patient home on June 22, 2009.   The patient did have a severe headache, and this was treated with  Vicodin and Dilaudid.  Dr. Estanislado Pandy felt that this might be due to  inflammation from the stent, although the patient has recently been  having some migraine headaches.  She also had back pain from lying in  bed for a prolonged period of time.   When the patient's bedrest was up, she was ambulated; however, she  developed some nausea and vomiting and a decision was made to keep the  patient overnight.   The following day, the patient was seen by Dr. Estanislado Pandy and she was  felt to be much improved and arrangements were made to discharge  the  patient in stable and improved condition on June 23, 2009.   LABORATORY DATA:  A basic metabolic panel on the 123456 revealed a BUN of  12, creatinine 0.92, GFR was greater than 60, potassium was 3.8, glucose  was mildly elevated at 121.  A CBC on the 15th revealed hemoglobin 12,  hematocrit 34.7, WBCs 10.3000, platelets 191,000.  A CBC on the 14th had  revealed hemoglobin 11.3, hematocrit 33.3.  On June 16, 2009, hemoglobin  had been 14.1, hematocrit 41.0.   DISCHARGE INSTRUCTIONS:  The patient was told to continue the medication  she had been previously taking at home, please see the list as noted  above.  She was to continue on the Plavix 75 mg daily until instructed  otherwise by Dr. Estanislado Pandy.  She was also to continue aspirin 81 mg  daily.  We did give the patient a prescription for Vicodin 5/325 to be  taken one q.6 h. p.r.n. headache pain.  The patient was instructed not  to drive while taking Vicodin.  She was given a prescription for 20 with  no refills.  The patient was also told to take ibuprofen for at its anti-  inflammatory properties in between the doses of Vicodin and as needed.   The patient was told to stay on her previous diet.  She was told to  drink plenty of fluids.  She was not to drive or do anything strenuous  for at least 2 weeks.  The patient will follow up with Dr. Estanislado Pandy in  approximately 2 weeks to discuss further evaluation of her remaining  aneurysms, and she was told to follow up with Dr. Marin Olp as needed or  as scheduled.   DISCHARGE DIAGNOSIS:  1. Status post stent placement on June 22, 2009, for a neck remnant of      a previously coiled right periophthalmic artery aneurysm.  2. Two residual aneurysms as described above.  3. History of a ruptured right periophthalmic artery aneurysm, which      was coiled in December 2008 in [place], [place].  4. History of right renal cancer with partial right nephrectomy.  5. History of severe  hypertension.  6. History of migraine headaches.  7. History of breast cancer, treated with surgery and radiation.  8. Status post ventriculoperitoneal shunt, which was placed at the      time of her original aneurysm rupture.  9. History of Graves disease.  10.History of depression.  11.History of polypectomy.  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  13.Status post multiple surgeries as noted.  14.Remote tobacco history.      Mikey Bussing, P.A.    ______________________________  Fritz Pickerel. Estanislado Pandy, M.D.    DR/MEDQ  D:  06/23/2009  T:  06/23/2009  Job:  [id number]   cc:   Kary Kos, M.D.  Rudell Cobb. Marin Olp, M.D.  Dr. Posey Pronto

## 2011-04-23 NOTE — Procedures (Signed)
VASCULAR LAB EXAM   INDICATION:  Follow up right groin pseudoaneurysm.   HISTORY:  Diabetes:  No.  Cardiac:  No.  Hypertension:  Yes.   EXAM:  Right groin duplex.   IMPRESSION:  1. There is a right groin pseudoaneurysm noted with the neck measuring      0.4 cm wide.  2. The pseudoaneurysm measures 1.1 cm X 1.0 cm.  3. There is a huge hematoma noted on the right upper thigh.   ___________________________________________  Nelda Severe. Kellie Simmering, M.D.   CB/MEDQ  D:  04/03/2010  T:  04/03/2010  Job:  [id number]

## 2011-04-23 NOTE — Consult Note (Signed)
NAMEMERIDA, SLATES                ACCOUNT NO.:  [id number]   MEDICAL RECORD NO.:  [id number]          PATIENT TYPE:  OUT   LOCATION:  XRAY                          FACILITY:  [facility]   PHYSICIAN:  Sanjeev K. Deveshwar, M.D.DATE OF BIRTH:  19-Mar-1941   DATE OF CONSULTATION:  06/05/2009  DATE OF DISCHARGE:  06/05/2009                                 CONSULTATION   CHIEF COMPLAINT:  Cerebral aneurysm.   BRIEF HISTORY:  This is a very pleasant 70 year old female referred to  Dr. Estanislado Pandy through the courtesy of Dr. Marin Olp.  The patient had a  right internal carotid artery aneurysm, which ruptured in December 2008.  This occurred while she and her husband were living in [place].  The patient was taken to [facility] where she underwent  coiling of the right internal carotid artery aneurysm on November 24, 2007, performed by Dr. Patrick North.  The patient did remarkably well after  her coiling.  Unfortunately, she was found to have renal cancer and  underwent a nephrectomy shortly after the coiling.  A followup angiogram  in September 2009 did reveal a neck remnant in the previously coiled  aneurysm and plans were to be made for further treatment in the future;  however, she and her husband have since moved to [place].  The  patient was recently seen at [facility] Emergency Room on May 13, 2009, with a severe headache which was reminiscent of the headache  she had experienced with the initial rupture of the aneurysm.  She had a  CT scan performed that showed no acute abnormality.  She presents today  accompanied by her husband to be evaluated by Dr. Estanislado Pandy.  They  brought with them today several CDs from the [facility]  containing the patient's previous studies.   PAST MEDICAL HISTORY:  Significant for severe hypertension.  The patient  is currently on four different antihypertensive medications.  She had  breast cancer in 2001.  It was treated  with surgery and radiation.  She  had the aneurysm coiling in December 2009.  She ventricular peritoneal  shunt placed following the aneurysm coiling.  She has a history of  Grave's disease treated with iodine.  She has had renal cancer and is  status post nephrectomy.  She had a history of migraine headaches when  she was premenopausal.  She has not had a significant problem with  headaches until just recently when she presented to the emergency  department.  She is status post polypectomy performed by [facility].  She has a history of depression.  She was recently found to have a pre-  melanomatous lesion on her abdomen and she is due to be seen by  dermatologist for this problem.   SURGICAL HISTORY:  Significant for a right lumpectomy cataract surgery,  a VP shunt, nephrectomy, and left shoulder surgery.  She denies any  previous problems with anesthesia.   ALLERGIES:  She has no known drug allergies.  She had no problems with  the contrast dye in the past.  She is  not allergic to latex.   CURRENT MEDICATIONS:  1. Prozac 40 mg b.i.d.  2. Maxzide 37.5/25 daily.  3. Synthroid daily, dosage apparently was recently adjusted upward.  4. Diltiazem 120 mg b.i.d.  5. Clonidine 0.1 mg at bedtime.  6. Avapro 300 mg daily.  7. Trazodone 100 mg daily.  8. Hydralazine 75 mg q.i.d. she had previously been on Lipitor;      however, she was concerned that she was on too many medications and      this apparently was stopped.   SOCIAL HISTORY:  The patient is married.  She and her husband have three  grown children.  The patient and her husband now live in [place].  They have previously lived in [place].  The patient quit  smoking at the time of her aneurysm rupture.  She previously smoked at  least a pack cigarettes per day for greater than 40 years.  She drinks  wine occasionally.  She is a retired Facilities manager.  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    FAMILY HISTORY:  Her mother is alive at age 81.  She has a permanent  pacemaker.  Her father died in his 25s from an MI.   IMPRESSION AND PLAN:  The patient and her husband present today for  further evaluation of a previously coiled right internal carotid artery  aneurysm that had ruptured in December 2008 and was treated at the  [facility].  The patient has a known residual neck remnant  and treatment had been recommended.  Dr. Estanislado Pandy reviewed the images  from the CDs.  They brought from the [facility].  He had pre  and post treatment images of the aneurysm.  He also noted the neck  remnant.  Dr. Estanislado Pandy estimated the aneurysm to be approximately 8 mm  x 4 or 5 mm.   We discussed aneurysms in general as the patient herself seem to have a  poor understanding of what she had been through.  We described the  coiling procedure as well as the stenting procedure.  There were given  some written patient education materials to study at home.  All of their  questions were answered.  Dr. Estanislado Pandy did review all of the images  with the patient and her husband.  At this time, the recommendation is  for a repeat cerebral angiogram.  Further treatment decisions will be  based on the results of the angiogram.  Greater than 60 minutes was  spent on this initial evaluation.  We have planned to do the angiogram  this coming Friday.      Mikey Bussing, P.A.    ______________________________  Fritz Pickerel. Estanislado Pandy, M.D.    DR/MEDQ  D:  06/05/2009  T:  06/06/2009  Job:  [id number]   cc:   Rudell Cobb. Marin Olp, M.D.  Kary Kos, M.D.  Dr. Posey Pronto

## 2011-04-23 NOTE — H&P (Signed)
Cassidy Ortiz, Cassidy Ortiz                ACCOUNT NO.:  [id number]   MEDICAL RECORD NO.:  [id number]          PATIENT TYPE:  OIB   LOCATION:  3109                         FACILITY:  [facility]   PHYSICIAN:  Sanjeev K. Deveshwar, M.D.DATE OF BIRTH:  1941/08/07   DATE OF ADMISSION:  06/21/2009  DATE OF DISCHARGE:                              HISTORY & PHYSICAL   This is a 70 year old female with a history of cerebral aneurysm which  was coiled originally in 2008 in [place], [place].  During routine  followup with her physician in [place], it was noted that there was  recanalization of  the aneurysm and was referred to Dr. Estanislado Pandy by Dr.  Marin Olp here in [place].  She is now scheduled for cerebral  arteriogram today and probable aneurysm stent and/or additional coiling.  The patient is aware of this procedure and risks.  She is agreeable to  proceed.   PAST MEDICAL HISTORY:  1. History of breast cancer.  2. History of renal cancer.  3. Graves disease.  4. Migraine headaches.  5. Depression.  6. 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  7. Hypertension.   SURGICAL HISTORY:  1. Polypectomy.  2. Right nephrectomy.  3. Breast surgery; lumpectomy.  4. Shoulder surgery.  5. VP shunt.  6. Cataract surgery.   MEDICATIONS:  1. Prozac 40 mg.  2. Maxzide 37.5/25.  3. Clonidine 0.1 mg.  4. Diltiazem 128 mg.  5. Synthroid 125 mcg  6. Avapro 300 mg.  7. Trazodone 100 mg.  8. Hydralazine 75 mg  9. Aspirin 81 mg.  10.Plavix 75 mg.   ALLERGIES:  None.   OBJECTIVE:  GENERAL:  Extraocular movements intact, alert and,  appropriate.  NEURO:  Intact.  HEART:  Regular rate and rhythm without murmur.  LUNGS: Clear to auscultation.  ABDOMEN: Soft, positive bowel sounds, nontender, no masses.  EXTREMITIES:  Full range of motion.  Gait is steady.  Good movement of  all extremities.  Good strength equal bilaterally.  VITAL SIGNS:  Stable.   ASSESSMENT:  Right internal carotid artery aneurysm for possible  stent  and/or additional coiling with Dr. Estanislado Pandy today.   PLAN:  Cerebral arteriogram; if intervention performed, the patient will  be admitted to the Neuro ICU overnight.      Lesleigh Noe Nonie Hoyer, P.A.    ______________________________  Fritz Pickerel Estanislado Pandy, M.D.    PAT/MEDQ  D:  06/21/2009  T:  06/22/2009  Job:  [id number]

## 2011-04-23 NOTE — Procedures (Signed)
VASCULAR LAB EXAM   INDICATION:  Followup right pseudoaneurysm repair.   HISTORY:  Diabetes:  No.  Cardiac:  No.  Hypertension:  Yes.   EXAM:  Right groin duplex.   IMPRESSION:  1. The right groin appears normal with no defects noted in the walls      of the right common femoral artery, right superficial femoral      artery and right femoral vein.  2. There is no evidence of pseudoaneurysm.   ___________________________________________  Judeth Cornfield. Scot Dock, M.D.   CB/MEDQ  D:  04/19/2010  T:  04/19/2010  Job:  [id number]

## 2011-04-26 NOTE — H&P (Signed)
[name], [name]                            ACCOUNT NO.:  [id number]   MEDICAL RECORD NO.:  [id number]                   PATIENT TYPE:  INP   LOCATION:  [place]                                 FACILITY:  [facility]   PHYSICIAN:  Cassidy Ortiz. Cassidy Ortiz, M.D.                 DATE OF BIRTH:  02-Sep-1941   DATE OF ADMISSION:  10/24/2002  DATE OF DISCHARGE:                                HISTORY & PHYSICAL   CHIEF COMPLAINT:  Rectal bleeding and weakness.   HISTORY OF PRESENT ILLNESS:  Cassidy Ortiz is a pleasant 70 year old white female  primary patient of Dr. Daisey Ortiz in [place], known to Dr. Verl Ortiz.  She underwent colonoscopy on Friday, October 22, 2002 for evaluation of  chronic diarrhea. She was found to have left colon diverticulosis and  multiple small left colon polyps, all of which were removed with a  combination of hot biopsy and snare. She had random colon biopsies done as  well. She states that she did fine post procedure and then on Saturday  evening at about 6:00 p.m. she had a bowel movement with red blood. She had  two more episodes last evening, each with red blood and associated light  headedness, sweats, pallor, etc. She went to bed. She did not have to get up  in the middle of the night and then this morning, has had two more bloody  bowel movements. She called and was advised to come to the emergency room.  In the emergency room, she is hemodynamically stable. Hemoglobin is 13. On  rectal examination, she has gross dark red blood. She is admitted for  supportive management with post polypectomy bleed.   CURRENT MEDICATIONS:  1. Arimidex 1 mg by mouth each day.  2. Megace 20 mg each morning.  3. Atenolol 100 mg each day.  4. Wellbutrin 150 mg each day.  5. Zoloft 50 mg each morning.  6. Trazodone 50 mg at bedtime.  7. Captopril 50 mg twice a day.  8. HCTZ 25 mg each day.   ALLERGIES:  No known drug allergies.   PAST MEDICAL HISTORY:  1. History of breath cancer in  2001, status post right lumpectomy and     radiation therapy. She is followed by Dr. Marin Ortiz.  2. Hypertension.  3. Cataracts status post extraction bilaterally.  4. History of Graves disease treated with Iodine.   FAMILY HISTORY:  Negative for GI disease.   SOCIAL HISTORY:  The patient is married. She is a Agricultural engineer. She is a smoker  of 1 pack per day long term and is trying to quit. No ETOH.   REVIEW OF SYSTEMS:  Cardiovascular, pulmonary, and genitourinary, all  reviewed and negative. Musculoskeletal is negative. GI is as above.   PHYSICAL EXAMINATION:  GENERAL:  A well developed, well nourished  white  female in no acute distress. She is anxious.  VITAL SIGNS: Blood pressure  155/85, pulse in the 70's. Respiratory rate 24,  temperature 98.1.  HEENT:  Atraumatic, normocephalic. Extraocular muscles intact. Pupils are  equal, round, and reactive to light and accommodation. Sclera nonicteric.  NECK:  Supple without nodes.  CARDIAC: Regular rate and rhythm with S1 and S2. There is no murmur, rub, or  gallop.  PULMONARY:  Clear to auscultation and percussion.  ABDOMEN: Soft. Bowel sounds active. She is minimally tender in the  suprapubic area. No guarding or rebound. No mass or hepatosplenomegaly.  RECTAL: Examination reveals maroon stool, grossly positive for blood.   LABORATORY DATA:  WBC 10.4, hemoglobin and hematocrit 13 and 37.5, MCV of  91.3 and platelets of 244.   IMPRESSION:  83. A 70 year old white female with post polypectomy bleed, status post     colonoscopy on October 22, 2002 and hemodynamically 1. stable.  2. Chronic diarrhea. Etiology not clear. Evaluation and progress.  3. History of breast cancer in 2001, currently on Megace and Arimidex.  4. Hypertension.  5. History of Graves disease.   PLAN:  The patient is admitted to the service of Dr. Scarlette Ortiz for bowel  rest, IV fluid hydration, serial hemoglobin and hematocrit, and transfusion  as indicated. Will  follow-up biopsies. If the patient has persistent or more  vigorous bleed, will consider colonoscopy. Otherwise, plan conservative  management.      Cassidy Ortiz, P.A.-C. [place]. Cassidy Ortiz, M.D.    AE/MEDQ  D:  10/25/2002  T:  10/25/2002  Job:  [id number]

## 2011-04-26 NOTE — Op Note (Signed)
[facility]  Patient:    Cassidy Ortiz, Cassidy Ortiz                         MRN: [id number] Proc. Date: 08/28/00 Adm. Date:  [id number] Disc. Date: [id number] Attending:  Cristela Blue CC:         Dr. Daisey Must, [place]  Dr. Rise Paganini. Marin Olp, M.D.   Operative Report  DATE OF BIRTH:  20-Oct-1941  PREOPERATIVE DIAGNOSIS:  Carcinoma of the right breast at 7 oclock position.  POSTOPERATIVE DIAGNOSIS:  Carcinoma of the right breast at 7 oclock position. Sentinel lymph node with counts of 3400, background of 10, negative on touch prep by Dr. Hetty Ely.  PROCEDURE:  Right segmental mastectomy and sentinel lymph node biopsy of right axilla.  SURGEON:  Fenton Malling. Lucia Gaskins, M.D.  ASSISTANT:  None.  ANESTHESIA:  General endotracheal.  ESTIMATED BLOOD LOSS:  50 cc.  DRAINS:  None.  DESCRIPTION OF PROCEDURE:  Ms. Youngs is a patient of Dr. 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  The patient comes for excision of both of these areas in the lower/outer quadrant of her right breast.  These areas are nonpalpable.  Dr. Lenell Antu Cardinosa placed a ______ coming out at the 7 oclock position of the right bust and 7-8 oclock position of the right breast pointing towards the nipple and this was the ______.  She had then injected the ______ sulfur colloid around the nipple.  Patient presented to the operating room, was given a general anesthetic.  The axilla was marked, which I found a hot node and the arm was prepped with Betadine solution and sterilely draped.  Attention was first turned to the sentinel lymph node using the neoprobe, I identified a hot lymph node right posterior to the pectoralis major muscle and the anterior right axilla.  A 3 cm incision was made with sharp dissection carried down identifying the  node.  The node was found to have counts of 3400 with a background of 10.  This was sent to pathology for touch preps.  Touch prep by Dr. Hetty Ely revealed no obvious tumor cells.  I took some additional fat, which did not have any radioactive material in it, this was sent as a separate specimen labeled axillary tissues.  I then turned my attention to the breast biopsy itself.  The wire was emanating out in a radial position at about the 7 or 8 oclock position.  I made an incision, which included this radial distribution of the wire and tried to get a 1-2 cm margin around the wire.  I saw no gross tumor.  I went down to the chest wall.  I marked the edges of the biopsy site with clips, irrigated the wound and sent the entire specimen down to pathology.  I talked with Dr. Hetty Ely.  I did not think touch preps would be of any assistance in determining what has been adequately excised.  I marked the medial border with a long suture, the cephalad border with a short suture and the lateral border was actually sort of marked by the wire coming out of it.  The wound was then irrigated.  The subcutaneous tissues closed with a 3-0 Vicryl suture, the skin closed with a 5-0 subcuticular Vicryl suture, painted with tincture of benzoin and Steri-Strips  and sterilely dressed.  The axilla was also closed with 3-0 Vicryl suture subcutaneous sutures, a 5-0 Vicryl in the subcuticular sutures and painted with tincture of benzoin and Steri-Strips.  The patient tolerated the procedure well, was transported to the recovery room in good condition.  Sponge and needle count were correct at the end of the case.  She will be discharged home today, be given Vicodin for pain.  She will see me back in one week for follow-up, review of the pathology and review of her incision. DD:  08/28/00 TD:  08/30/00 Job: [id number]

## 2011-05-02 ENCOUNTER — Other Ambulatory Visit (INDEPENDENT_AMBULATORY_CARE_PROVIDER_SITE_OTHER): Payer: Medicare Other

## 2011-05-02 ENCOUNTER — Ambulatory Visit (INDEPENDENT_AMBULATORY_CARE_PROVIDER_SITE_OTHER): Payer: Medicare Other | Admitting: Gastroenterology

## 2011-05-02 ENCOUNTER — Ambulatory Visit: Payer: Medicare Other | Admitting: Gastroenterology

## 2011-05-02 ENCOUNTER — Encounter: Payer: Self-pay | Admitting: Gastroenterology

## 2011-05-02 DIAGNOSIS — Z860100 Personal history of colon polyps, unspecified: Secondary | ICD-10-CM

## 2011-05-02 DIAGNOSIS — R1013 Epigastric pain: Secondary | ICD-10-CM

## 2011-05-02 DIAGNOSIS — I10 Essential (primary) hypertension: Secondary | ICD-10-CM

## 2011-05-02 DIAGNOSIS — Z85528 Personal history of other malignant neoplasm of kidney: Secondary | ICD-10-CM

## 2011-05-02 DIAGNOSIS — C50919 Malignant neoplasm of unspecified site of unspecified female breast: Secondary | ICD-10-CM

## 2011-05-02 DIAGNOSIS — K219 Gastro-esophageal reflux disease without esophagitis: Secondary | ICD-10-CM

## 2011-05-02 DIAGNOSIS — Z800 Family history of malignant neoplasm of digestive organs: Secondary | ICD-10-CM

## 2011-05-02 LAB — CBC WITH DIFFERENTIAL/PLATELET
Eosinophils Absolute: 0.3 10*3/uL (ref 0.0–0.7)
Eosinophils Relative: 3.4 % (ref 0.0–5.0)
Lymphocytes Relative: 14.2 % (ref 12.0–46.0)
MCHC: 33.3 g/dL (ref 30.0–36.0)
MCV: 86.5 fl (ref 78.0–100.0)
Monocytes Absolute: 0.5 10*3/uL (ref 0.1–1.0)
Neutrophils Relative %: 74.9 % (ref 43.0–77.0)
Platelets: 236 10*3/uL (ref 150.0–400.0)
WBC: 9.2 10*3/uL (ref 4.5–10.5)

## 2011-05-02 LAB — PROTIME-INR
INR: 0.9 ratio (ref 0.8–1.0)
Prothrombin Time: 10.5 s (ref 10.2–12.4)

## 2011-05-02 MED ORDER — PEG-KCL-NACL-NASULF-NA ASC-C 100 G PO SOLR: 1.0000 | Freq: Once | ORAL | Status: AC

## 2011-05-02 MED ORDER — SUCRALFATE 1 GM/10ML PO SUSP: ORAL | Status: DC

## 2011-05-02 NOTE — Progress Notes (Signed)
History of Present Illness:  This is a 70 year old Caucasian female with multiple medical and surgical problems referred by [facility] care for right waist and on epigastric discomfort over the last several months refractory to Prilosec therapy. She describes her pain as gnawing pain not related to meals bowel movements, and there are no hepatobiliary complaints. She has had previous nephrectomy for renal cell carcinoma, also mastectomy for breast cancer. She has had triple aneurysm requiring interventional radiology, and she has a cerebral-peritoneal shunt. Other problems include chronic hypothyroidism, and chronic anxiety and depression.  10 2003 she had multiple colon polyps removed and had a post polypectomy minor hemorrhage. She has not had followup exam since that time. It is of note, that she has a sister who had colon cancer at age 54. The patient denies lower regularity, melena, hematochezia, or lower gastrointestinal issues. She is overriding of medications including when necessary alprazolam, regular trazodone, and Ambien at bedtime.  I have reviewed this patient's present history, medical and surgical past history, allergies and medications.     ROS: The remainder of the 10 point ROS is negative.. she denies current cardiovascular or pulmonary symptomatology. Other diagnoses on chart review includes fatty liver, incidental renal cyst and rather kidney, stable heart murmur, and a questionable history of a duodenal ulcer.     Physical Exam: General well developed well nourished patient in no acute distress, appearing her stated age the patient is somewhat teary a day and discuss her medical problems. Eyes PERRLA, no icterus, fundoscopic exam per opthamologist Skin no lesions noted Neck supple, no adenopathy, no thyroid enlargement, no tenderness Chest clear to percussion and auscultation Heart no significant murmurs, gallops or rubs noted Abdomen no hepatosplenomegaly masses or  tenderness, BS normal.  Extremities no acute joint lesions, edema, phlebitis or evidence of cellulitis. Neurologic patient oriented x 3, cranial nerves intact, no focal neurologic deficits noted. Psychological mental status normal and normal affect.  Assessment and plan: Probable H. pylori infection with associated duodenal or gastric ulceration. The patient denies abuse of alcohol, cigarettes, or NSAIDs. I am surprised she has not had any improvement with Prilosec therapy which we will continue. We have added Carafate suspension pc. and qhs. Endoscopy with biopsies for H. pylori has been scheduled. Her previous polyps were hyperplastic, she has a strong family history of colon carcinoma, and we'll schedule followup colonoscopy exam with propofol sedation because of her multiple cardiovascular issues and severe hypertension. I will order CBC and prothrombin time for review. She had previous post polypectomy bleed with hot biopsy forceps which we will not use, and she has polyps we will try up to use cold snare removal techniques if possible. Still, I cannot guarantee she will not have post polypectomy bleeding, and I reviewed this with her husband in detail. They have agreed to the risks of this procedure, and I have reviewed alternative methods of colonic examination with them. She is to continue other medications as per primary care and cardiology.  Please copy her primary care physician, referring physician, and pertinent subspecialists.  Encounter Diagnoses  Name Primary?  . Family history of malignant neoplasm of gastrointestinal tract   . Personal history of colonic polyps   . Esophageal reflux   . Epigastric pain   . Hypertension   . History of kidney cancer   . Breast cancer

## 2011-05-02 NOTE — Patient Instructions (Signed)
Your procedure has been scheduled for 05/13/2011, please follow the seperate instructions.  Please go to the basement today for your labs.  Your prescription(s) have been sent to you pharmacy.

## 2011-05-03 ENCOUNTER — Telehealth: Payer: Self-pay | Admitting: Gastroenterology

## 2011-05-03 NOTE — Telephone Encounter (Signed)
Advised take carafate as directed not as insert advises.

## 2011-05-08 ENCOUNTER — Telehealth: Payer: Self-pay | Admitting: Gastroenterology

## 2011-05-09 ENCOUNTER — Encounter: Payer: Self-pay | Admitting: Gastroenterology

## 2011-05-09 NOTE — Telephone Encounter (Signed)
Dr Sharlett Iles, you are doing ECL with Propofol on Monday and per your notes, you will take biopsies.  Should pt stop her ASA , 2 -81mg  tabs daily? Thanks.

## 2011-05-09 NOTE — Telephone Encounter (Signed)
Notified pt Dr Sharlett Iles stated she can continue her ASA ; pt stated understanding.

## 2011-05-13 ENCOUNTER — Ambulatory Visit (AMBULATORY_SURGERY_CENTER): Payer: Medicare Other | Admitting: Gastroenterology

## 2011-05-13 ENCOUNTER — Encounter: Payer: Self-pay | Admitting: Gastroenterology

## 2011-05-13 ENCOUNTER — Other Ambulatory Visit: Payer: Self-pay | Admitting: Gastroenterology

## 2011-05-13 VITALS — BP 154/104 | HR 69 | Temp 98.2°F | Resp 20 | Ht 67.0 in | Wt 200.0 lb

## 2011-05-13 DIAGNOSIS — K219 Gastro-esophageal reflux disease without esophagitis: Secondary | ICD-10-CM

## 2011-05-13 DIAGNOSIS — K6389 Other specified diseases of intestine: Secondary | ICD-10-CM

## 2011-05-13 DIAGNOSIS — D126 Benign neoplasm of colon, unspecified: Secondary | ICD-10-CM | POA: Insufficient documentation

## 2011-05-13 DIAGNOSIS — G8929 Other chronic pain: Secondary | ICD-10-CM | POA: Insufficient documentation

## 2011-05-13 DIAGNOSIS — Z800 Family history of malignant neoplasm of digestive organs: Secondary | ICD-10-CM

## 2011-05-13 DIAGNOSIS — K5730 Diverticulosis of large intestine without perforation or abscess without bleeding: Secondary | ICD-10-CM

## 2011-05-13 DIAGNOSIS — R109 Unspecified abdominal pain: Secondary | ICD-10-CM

## 2011-05-13 DIAGNOSIS — Z86010 Personal history of colonic polyps: Secondary | ICD-10-CM

## 2011-05-13 DIAGNOSIS — K269 Duodenal ulcer, unspecified as acute or chronic, without hemorrhage or perforation: Secondary | ICD-10-CM

## 2011-05-13 DIAGNOSIS — K3189 Other diseases of stomach and duodenum: Secondary | ICD-10-CM

## 2011-05-13 DIAGNOSIS — D128 Benign neoplasm of rectum: Secondary | ICD-10-CM

## 2011-05-13 DIAGNOSIS — R1013 Epigastric pain: Secondary | ICD-10-CM

## 2011-05-13 DIAGNOSIS — K5190 Ulcerative colitis, unspecified, without complications: Secondary | ICD-10-CM

## 2011-05-13 MED ORDER — SODIUM CHLORIDE 0.9 % IV SOLN: 500.0000 mL | INTRAVENOUS | Status: DC

## 2011-05-13 NOTE — Patient Instructions (Signed)
Please read the handouts given to you by your recovery room nurse.  Please increase the fiber in your diet due to your severe diverticulosis.  Resume your medications as normal.   Dr. Buel Ream nurse from the 3rd floor will call you tomorrow to schedule your CT.  You have your contrast since your recovery room nurse gave it to you before discharge.  Dr. Sharlett Iles would like for you to take nexium daily for relief of your abdominal pain.  He will give you samples.  If you have any questions or concerns, please call [phone].  Thank-you.

## 2011-05-14 ENCOUNTER — Ambulatory Visit (INDEPENDENT_AMBULATORY_CARE_PROVIDER_SITE_OTHER)
Admission: RE | Admit: 2011-05-14 | Discharge: 2011-05-14 | Disposition: A | Discharge status: Home / Self Care | Payer: Medicare Other | Source: Ambulatory Visit | Attending: Internal Medicine | Admitting: Internal Medicine

## 2011-05-14 ENCOUNTER — Telehealth: Payer: Self-pay | Admitting: Gastroenterology

## 2011-05-14 ENCOUNTER — Telehealth: Payer: Self-pay | Admitting: *Deleted

## 2011-05-14 DIAGNOSIS — K319 Disease of stomach and duodenum, unspecified: Secondary | ICD-10-CM

## 2011-05-14 DIAGNOSIS — K3189 Other diseases of stomach and duodenum: Secondary | ICD-10-CM

## 2011-05-14 DIAGNOSIS — K219 Gastro-esophageal reflux disease without esophagitis: Secondary | ICD-10-CM

## 2011-05-14 LAB — HELICOBACTER PYLORI SCREEN-BIOPSY: UREASE: NEGATIVE

## 2011-05-14 MED ORDER — IOHEXOL 300 MG/ML  SOLN
80.0000 mL | Freq: Once | INTRAMUSCULAR | Status: AC | PRN
  Administered 2011-05-14: 80 mL via INTRAVENOUS

## 2011-05-14 NOTE — Telephone Encounter (Signed)
Advised husband of all results and made appt for 05/28/2011, Dr Sharlett Iles is off next week.

## 2011-05-14 NOTE — Telephone Encounter (Signed)
Advised take Nexium once a day and Carafate prn.

## 2011-05-14 NOTE — Telephone Encounter (Signed)
Message copied by Sheral Flow on Tue May 14, 2011 12:59 PM ------      Message from: Sharlett Iles, DAVID R      Created: Tue May 14, 2011 12:07 PM       Biopsies do not show cancer...seeme next week,she may need EUS,,

## 2011-05-14 NOTE — Telephone Encounter (Signed)
No answer; left message for pt to call back if she has any questions or concerns

## 2011-05-21 ENCOUNTER — Encounter: Payer: Self-pay | Admitting: *Deleted

## 2011-05-23 ENCOUNTER — Ambulatory Visit
Admission: RE | Admit: 2011-05-23 | Discharge: 2011-05-23 | Disposition: A | Discharge status: Home / Self Care | Payer: Medicare Other | Source: Ambulatory Visit | Attending: Internal Medicine | Admitting: Internal Medicine

## 2011-05-23 ENCOUNTER — Other Ambulatory Visit: Payer: Self-pay | Admitting: Internal Medicine

## 2011-05-23 DIAGNOSIS — R52 Pain, unspecified: Secondary | ICD-10-CM

## 2011-05-23 DIAGNOSIS — R609 Edema, unspecified: Secondary | ICD-10-CM

## 2011-05-28 ENCOUNTER — Ambulatory Visit (INDEPENDENT_AMBULATORY_CARE_PROVIDER_SITE_OTHER): Payer: Medicare Other | Admitting: Gastroenterology

## 2011-05-28 ENCOUNTER — Encounter: Payer: Self-pay | Admitting: Gastroenterology

## 2011-05-28 ENCOUNTER — Ambulatory Visit: Payer: Medicare Other | Admitting: Gastroenterology

## 2011-05-28 ENCOUNTER — Other Ambulatory Visit: Payer: Medicare Other

## 2011-05-28 ENCOUNTER — Telehealth: Payer: Self-pay | Admitting: *Deleted

## 2011-05-28 VITALS — BP 138/76 | HR 80 | Ht 67.0 in | Wt 222.2 lb

## 2011-05-28 DIAGNOSIS — D126 Benign neoplasm of colon, unspecified: Secondary | ICD-10-CM

## 2011-05-28 DIAGNOSIS — K760 Fatty (change of) liver, not elsewhere classified: Secondary | ICD-10-CM | POA: Insufficient documentation

## 2011-05-28 DIAGNOSIS — K319 Disease of stomach and duodenum, unspecified: Secondary | ICD-10-CM

## 2011-05-28 DIAGNOSIS — K7689 Other specified diseases of liver: Secondary | ICD-10-CM

## 2011-05-28 DIAGNOSIS — K269 Duodenal ulcer, unspecified as acute or chronic, without hemorrhage or perforation: Secondary | ICD-10-CM

## 2011-05-28 DIAGNOSIS — K3189 Other diseases of stomach and duodenum: Secondary | ICD-10-CM

## 2011-05-28 MED ORDER — ESOMEPRAZOLE MAGNESIUM 40 MG PO CPDR: 40.0000 mg | DELAYED_RELEASE_CAPSULE | Freq: Every day | ORAL | Status: DC

## 2011-05-28 NOTE — Telephone Encounter (Signed)
CALLED PT TO INFORM SHE HAS BEEN SCHEDULED WITH DR Deatra Ina ON 07/23/2011 AT 11AM, PREVISIT ON 05/17/2011 AT 10AM

## 2011-05-28 NOTE — Progress Notes (Signed)
This is a complicated 70 year old Caucasian female who has a history of cerebral aneurysms requiring ventricular-peritoneal shunting. She also has had surgery for renal cell carcinoma and breast carcinoma. I recently performed colonoscopy with removal of some benign adenomas. She had a large flat polyp at hepatic flexure area for colon that was tattooed. She initially presented with epigastric abdominal pain, and endoscopy showed a deep duodenal ulceration with associated tissue deformity and edema. Biopsies showed no evidence of malignancy and testing for H. pylori was negative. Currently she is asymptomatic on Nexium 40 mg a day. Her appetite is good and her weight is stable. There is no history of hepatobiliary symptomatology or systemic complaints.  Current Medications, Allergies, Past Medical History, Past Surgical History, Family History and Social History were reviewed in [employer] record.  Pertinent Review of Systems Negative  PE: Awake and alert no acute distress, appears stated age. Abdominal exam is unremarkable without organomegaly, masses, tenderness, or evidence of ascites. Bowel sounds are normal.  Assessment and Plan History of multiple colon polyps with a large flat right colon polyp which will need submucosal resection and probable laser therapy. I will schedule this with Dr. Deatra Ina at [facility] with propofol sedation if he agrees. The base of the polyp was tattooed with [place] ink, making localization easy. Also at the time of her colonoscopy, I   requested followup endoscopic exam of her duodenal lesion that is also seen on CT scan of the abdomen.Sshe is to continue Nexium 40 mg a day with when necessary Carafate use . Serum gastrin level ordered for review. Biopsies for H. pylori were negative. The patient is not on NSAID therapy.   Assessment and Plan: Encounter Diagnosis  Name Primary?  . Fatty liver Yes   Please copy her primary care  physician, referring physician, and pertinent subspecialists.

## 2011-05-28 NOTE — Patient Instructions (Signed)
Your prescription(s) have been sent to you pharmacy.  Please go to the basement today for your labs.   

## 2011-05-29 NOTE — Telephone Encounter (Signed)
PT NOTIFIED OF PROCEDURE AND PREVISIT TIME AND DATE

## 2011-07-15 ENCOUNTER — Ambulatory Visit (AMBULATORY_SURGERY_CENTER): Payer: Medicare Other | Admitting: *Deleted

## 2011-07-15 VITALS — Ht 67.0 in | Wt 221.1 lb

## 2011-07-15 DIAGNOSIS — K319 Disease of stomach and duodenum, unspecified: Secondary | ICD-10-CM

## 2011-07-15 DIAGNOSIS — D126 Benign neoplasm of colon, unspecified: Secondary | ICD-10-CM

## 2011-07-15 DIAGNOSIS — K269 Duodenal ulcer, unspecified as acute or chronic, without hemorrhage or perforation: Secondary | ICD-10-CM

## 2011-07-15 MED ORDER — PEG-KCL-NACL-NASULF-NA ASC-C 100 G PO SOLR: ORAL | Status: DC

## 2011-07-23 ENCOUNTER — Other Ambulatory Visit: Payer: Self-pay | Admitting: Gastroenterology

## 2011-07-23 ENCOUNTER — Encounter: Payer: Medicare Other | Admitting: Gastroenterology

## 2011-07-23 ENCOUNTER — Ambulatory Visit (HOSPITAL_COMMUNITY)
Admission: RE | Admit: 2011-07-23 | Discharge: 2011-07-24 | Disposition: A | Discharge status: Home / Self Care | Payer: Medicare Other | Source: Ambulatory Visit | Attending: Gastroenterology | Admitting: Gastroenterology

## 2011-07-23 DIAGNOSIS — Z01812 Encounter for preprocedural laboratory examination: Secondary | ICD-10-CM | POA: Insufficient documentation

## 2011-07-23 DIAGNOSIS — Z853 Personal history of malignant neoplasm of breast: Secondary | ICD-10-CM | POA: Insufficient documentation

## 2011-07-23 DIAGNOSIS — D126 Benign neoplasm of colon, unspecified: Secondary | ICD-10-CM

## 2011-07-23 DIAGNOSIS — G4733 Obstructive sleep apnea (adult) (pediatric): Secondary | ICD-10-CM | POA: Insufficient documentation

## 2011-07-23 DIAGNOSIS — Z860100 Personal history of colon polyps, unspecified: Secondary | ICD-10-CM | POA: Insufficient documentation

## 2011-07-23 DIAGNOSIS — K625 Hemorrhage of anus and rectum: Secondary | ICD-10-CM | POA: Insufficient documentation

## 2011-07-23 DIAGNOSIS — K2990 Gastroduodenitis, unspecified, without bleeding: Secondary | ICD-10-CM

## 2011-07-23 DIAGNOSIS — K319 Disease of stomach and duodenum, unspecified: Secondary | ICD-10-CM | POA: Insufficient documentation

## 2011-07-23 DIAGNOSIS — D500 Iron deficiency anemia secondary to blood loss (chronic): Secondary | ICD-10-CM | POA: Insufficient documentation

## 2011-07-23 DIAGNOSIS — K2970 Gastritis, unspecified, without bleeding: Secondary | ICD-10-CM

## 2011-07-23 DIAGNOSIS — E785 Hyperlipidemia, unspecified: Secondary | ICD-10-CM | POA: Insufficient documentation

## 2011-07-23 DIAGNOSIS — E039 Hypothyroidism, unspecified: Secondary | ICD-10-CM | POA: Insufficient documentation

## 2011-07-23 DIAGNOSIS — Z905 Acquired absence of kidney: Secondary | ICD-10-CM | POA: Insufficient documentation

## 2011-07-23 DIAGNOSIS — K5730 Diverticulosis of large intestine without perforation or abscess without bleeding: Secondary | ICD-10-CM | POA: Insufficient documentation

## 2011-07-23 DIAGNOSIS — IMO0002 Reserved for concepts with insufficient information to code with codable children: Principal | ICD-10-CM | POA: Insufficient documentation

## 2011-07-23 DIAGNOSIS — C649 Malignant neoplasm of unspecified kidney, except renal pelvis: Secondary | ICD-10-CM | POA: Insufficient documentation

## 2011-07-23 DIAGNOSIS — Z01810 Encounter for preprocedural cardiovascular examination: Secondary | ICD-10-CM | POA: Insufficient documentation

## 2011-07-23 DIAGNOSIS — I10 Essential (primary) hypertension: Secondary | ICD-10-CM | POA: Insufficient documentation

## 2011-07-23 DIAGNOSIS — D371 Neoplasm of uncertain behavior of stomach: Secondary | ICD-10-CM | POA: Insufficient documentation

## 2011-07-23 DIAGNOSIS — Z79899 Other long term (current) drug therapy: Secondary | ICD-10-CM | POA: Insufficient documentation

## 2011-07-23 DIAGNOSIS — K5520 Angiodysplasia of colon without hemorrhage: Secondary | ICD-10-CM | POA: Insufficient documentation

## 2011-07-23 DIAGNOSIS — Y838 Other surgical procedures as the cause of abnormal reaction of the patient, or of later complication, without mention of misadventure at the time of the procedure: Secondary | ICD-10-CM | POA: Insufficient documentation

## 2011-07-24 ENCOUNTER — Observation Stay (HOSPITAL_COMMUNITY)
Admission: EM | Admit: 2011-07-24 | Discharge: 2011-07-25 | Disposition: A | Discharge status: Home / Self Care | Payer: Medicare Other | Attending: Gastroenterology | Admitting: Gastroenterology

## 2011-07-24 ENCOUNTER — Emergency Department (HOSPITAL_COMMUNITY): Payer: Medicare Other

## 2011-07-24 ENCOUNTER — Telehealth: Payer: Self-pay | Admitting: Gastroenterology

## 2011-07-24 DIAGNOSIS — K921 Melena: Secondary | ICD-10-CM

## 2011-07-24 LAB — COMPREHENSIVE METABOLIC PANEL
ALT: 13 U/L (ref 0–35)
AST: 19 U/L (ref 0–37)
Albumin: 3.3 g/dL — ABNORMAL LOW (ref 3.5–5.2)
Alkaline Phosphatase: 89 U/L (ref 39–117)
GFR calc Af Amer: 48 mL/min — ABNORMAL LOW (ref >60)
Glucose, Bld: 99 mg/dL (ref 70–99)
Potassium: 4.4 mEq/L (ref 3.5–5.1)
Sodium: 137 mEq/L (ref 135–145)
Total Protein: 6.6 g/dL (ref 6.0–8.3)

## 2011-07-24 LAB — DIFFERENTIAL
Basophils Relative: 0 % (ref 0–1)
Eosinophils Absolute: 0.1 10*3/uL (ref 0.0–0.7)
Neutrophils Relative %: 75 % (ref 43–77)

## 2011-07-24 LAB — CBC
MCH: 27.1 pg (ref 26.0–34.0)
Platelets: 208 10*3/uL (ref 150–400)
RBC: 3.87 MIL/uL (ref 3.87–5.11)
RDW: 16.8 % — ABNORMAL HIGH (ref 11.5–15.5)
WBC: 10 10*3/uL (ref 4.0–10.5)

## 2011-07-24 LAB — HEMOGLOBIN AND HEMATOCRIT, BLOOD
HCT: 29.8 % — ABNORMAL LOW (ref 36.0–46.0)
Hemoglobin: 9.5 g/dL — ABNORMAL LOW (ref 12.0–15.0)

## 2011-07-24 LAB — PROTIME-INR: INR: 0.94 (ref 0.00–1.49)

## 2011-07-24 LAB — URINE MICROSCOPIC-ADD ON

## 2011-07-24 LAB — TYPE AND SCREEN: Antibody Screen: NEGATIVE

## 2011-07-24 LAB — URINALYSIS, ROUTINE W REFLEX MICROSCOPIC
Bilirubin Urine: NEGATIVE
Glucose, UA: NEGATIVE mg/dL
Hgb urine dipstick: NEGATIVE
Specific Gravity, Urine: 1.026 (ref 1.005–1.030)
pH: 5.5 (ref 5.0–8.0)

## 2011-07-24 LAB — ABO/RH: ABO/RH(D): O POS

## 2011-07-24 LAB — LACTIC ACID, PLASMA: Lactic Acid, Venous: 0.8 mmol/L (ref 0.5–2.2)

## 2011-07-24 LAB — OCCULT BLOOD, POC DEVICE: Fecal Occult Bld: POSITIVE

## 2011-07-24 NOTE — Telephone Encounter (Signed)
FYI

## 2011-07-24 NOTE — Telephone Encounter (Signed)
Pt had egd/colon yesterday and had some polyps removed. Pt states she had some bleeding through the night and has had more bleeding this morning. States the toilet water was red and she had passed some clots. Have paged Dr. Deatra Ina. Spoke with with Dr. Henrene Pastor and he suggests pt go to [facility] ER. Pt knows to go to [facility] ER. Tye Savoy NP aware that pt is on her way.

## 2011-07-25 ENCOUNTER — Telehealth: Payer: Self-pay | Admitting: Gastroenterology

## 2011-07-25 LAB — BASIC METABOLIC PANEL
BUN: 12 mg/dL (ref 6–23)
Creatinine, Ser: 1.1 mg/dL (ref 0.50–1.10)
GFR calc Af Amer: 60 mL/min — ABNORMAL LOW (ref >60)
GFR calc non Af Amer: 49 mL/min — ABNORMAL LOW (ref >60)
Glucose, Bld: 148 mg/dL — ABNORMAL HIGH (ref 70–99)

## 2011-07-25 LAB — HEMOGLOBIN AND HEMATOCRIT, BLOOD
HCT: 31.5 % — ABNORMAL LOW (ref 36.0–46.0)
Hemoglobin: 10 g/dL — ABNORMAL LOW (ref 12.0–15.0)
Hemoglobin: 10.1 g/dL — ABNORMAL LOW (ref 12.0–15.0)

## 2011-07-25 NOTE — Telephone Encounter (Signed)
Pt was discharged from the hospital today and states that her back is hurting. Pt wants to know what she can take for her back pain. Pt had some bleeding following the removal of a polyp. Pt instructed that she may take Tylenol for pain. Pt verbalized understanding. Pt wanted to know what to do if the Tylenol did not work. Let pt know she would have to call back if it did not help.

## 2011-08-08 ENCOUNTER — Other Ambulatory Visit: Payer: Self-pay | Admitting: Hematology & Oncology

## 2011-08-08 DIAGNOSIS — Z1231 Encounter for screening mammogram for malignant neoplasm of breast: Secondary | ICD-10-CM

## 2011-08-20 NOTE — H&P (Signed)
NAMEBRADEN, Ortiz NO.:  [id number]  MEDICAL RECORD NO.:  [id number]  LOCATION:  [id number]                         FACILITY[facility]  [facility]  PHYSICIAN:  Cassidy Salaam. Deatra Ina, MD,FACGDATE OF BIRTH:  [birth year]  DATE OF ADMISSION:  07/24/2011 DATE OF DISCHARGE:                             HISTORY & PHYSICAL   PROBLEM:  Acute postpolypectomy hemorrhage.  HISTORY:  Cassidy Ortiz is a pleasant 70 year old white female known to Dr. Sharlett Iles as well as Dr. Deatra Ortiz who underwent colonoscopy and upper endoscopy yesterday, July 23, 2011 per Dr. Deatra Ortiz for removal of a previously noted transverse colon adenoma approximately 3 cm in size. She had injection and then piecemeal removal of a 15-mm polyp in the ascending colon as well and then submucosal injection and piecemeal removal of the transverse colon polyp.  She was also noted to have AVMs in the ascending colon.  Upper endoscopy was also done for followup of a previously noted duodenal ulcer which was found per Dr. Sharlett Iles in June 2012.  Initially this was felt to be a mass.  However biopsies that showed changes of acute ulceration.  There was no evidence of duodenal ulcer on yesterday's exam.  She did have multiple enlarged duodenal folds.  The patient tolerated the procedures well and was discharged to home and then had onset of bright red blood per rectum about 2 a.m. this morning. She reports multiple bloody stools since that time which eventually stopped around 11 a.m.  Since arrival in the emergency room she has not had any further stooling.  Her blood pressure was around [id number] systolic on arrival to the ER, pulse was in the 60s.  She has had some mild abdominal tenderness.  No significant cramping or pain.  She denies any diaphoresis or syncope, did feel somewhat dizzy.  At this time, she is seen and evaluated and admitted to observation for a postpolypectomy bleed.  PAST MEDICAL HISTORY: 1. History of right carotid artery  aneurysm with coiling 2008. 2. Hypertension. 3. Hyperlipidemia. 4. History of renal cell CA status post right nephrectomy in 2009. 5. History of breast cancer status post resection 2001. 6. Hypothyroidism.  She had a VP shunt placed because of her cerebral     aneurysm with previous rupture.  She is status post cataract     excision and has previous history of adenomatous colon polyps.  CURRENT MEDICATIONS:  Ambien 10 mg at bedtime p.r.n., aspirin 81 mg daily, carvedilol 6.25 twice daily, Diltiazem 120 b.i.d., hydralazine 75 b.i.d., levothyroxine 15 mcg q.a.m., Nexium 40 daily, simvastatin 20 daily, trazodone 150 daily, and Ziibryn 40 mg p.o. daily.  ALLERGIES:  None.  FAMILY HISTORY:  Pertinent for mother and father both with coronary disease.  SOCIAL HISTORY:  The patient is married.  She has grown children.  She is an ex-smoker, uses alcohol socially.  REVIEW OF SYSTEMS:  Completely negative.  Currently denies any chest pain or anginal symptoms.  No cough, shortness of breath or sputum production.  GI: As outlined above.  GU: Denies any dysuria, urgency or frequency.  MUSCULOSKELETAL:  No current arthralgias, myalgias, or other pain.  NEUROPSYCH:  Negative again and all other 10-point review of  systems unremarkable.  PHYSICAL EXAM:  GENERAL:  Well-developed white female in no acute distress.  She is alert and oriented x3.  Skin is warm and dry. VITAL SIGNS:  Blood pressure 103/65, after fluids 132/70, temperature 98.4, pulse in the 50s, sats 96 on room air. HEENT:  Nontraumatic, normocephalic.  EOMI, PERRLA.  Sclerae anicteric. NECK:  Supple.  There is no JVD. CARDIOVASCULAR:  Regular rate and rhythm with S1-S2.  No murmur or gallop. PULMONARY:  Clear bilaterally. ABDOMEN:  Soft and nondistended.  Bowel sounds are active.  There is no palpable mass or hepatosplenomegaly. RECTAL EXAM:  Per ER MD showed dark maroon stool. EXTREMITIES:  No clubbing, cyanosis or edema. NEURO:   The patient is alert and oriented x3 and exam grossly nonfocal.  LABORATORY STUDIES:  On admission WBC of 10, hemoglobin 10.5, hematocrit of 32.8, platelets 208.  Remainder pending.  The patient had most recent labs in May 2012 showing hemoglobin of 12.4, hematocrit of 37.2.  IMPRESSION: 78. A 70 year old female with acute lower GI bleed status post     colonoscopy and piecemeal removal of two colon polyps on July 23, 2011, the largest of these was 3 cm located in the transverse     colon.  The second polyp was 15 mm located in the ascending colon.     Her current bleed is consistent with postpolypectomy hemorrhage. 2. Anemia secondary to above. 3. History of hypertension. 4. History of cerebral and carotid aneurysm status post coiling and     status post previous ventricular peritoneal shunt. 5. Status post right nephrectomy for renal cell CA 2009. 6. History of breast cancer status post resection 2001. 7. Hypothyroidism. 8. Hyperlipidemia. 9. Depression. 10.History of duodenal ulcer in June 2012 healed as of yesterday's     endoscopy.  PLAN:  The patient is admitted to the service of Dr. Erskine Emery.  She will be hydrated, kept at bedrest and on clear liquids, will obtain serial H&H and transfuse as needed for hemoglobin less than 8.  For now we will hold on repeat prep, however, if she has evidence of acute renal bleeding.  She will require at least a partial bowel prep and repeat colonoscopy for control of bleeding.     Amy Esterwood, PA-C   ______________________________ Cassidy Salaam Deatra Ina, MD,FACG    AE/MEDQ  D:  07/24/2011  T:  07/25/2011  Job:  [id number]  cc:   Dr. Sharlett Iles  Electronically Signed by AMY ESTERWOOD PA-C on 08/13/2011 03:50:33 PM Electronically Signed by Erskine Emery MDFACG on 08/20/2011 08:39:35 AM

## 2011-08-20 NOTE — Discharge Summary (Signed)
  [name], [name]:  [id number]  MEDICAL RECORD NO.:  [id number]  LOCATION:  [id number]                         FACILITY:  [facility]  PHYSICIAN:  Sandy Salaam. Deatra Ina, MD,FACGDATE OF BIRTH:  May 29, 1941  DATE OF ADMISSION:  07/24/2011 DATE OF DISCHARGE:  07/25/2011                              DISCHARGE SUMMARY   PRIMARY GASTROENTEROLOGIST:  Loralee Pacas. Sharlett Iles, MD, Marval Regal, FACP, FAGA  DISPOSITION:  Home in stable condition.  DISCHARGE MEDICATIONS:  Continue home medications including, 1. Ambien 10 mg at bedtime as needed. 2. Aspirin 81 mg daily. 3. Coreg 6.25 mg twice daily. 4. Diltiazem 125 mg twice daily. 5. Hydralazine 75 mg twice daily. 6. Levothyroxine 15 mcg daily. 7. Nexium 40 mg daily. 8. Simvastatin 20 mg daily. 9. Trazodone 150 mg daily. 10.Viibryd 40 mg daily.  CONSULTATIONS:  None requested.  PROCEDURES:  None performed this admission.  DISCHARGE DIAGNOSES: 1. Painless rectal bleeding, low volume.  Suspect post polypectomy     bleed.  No major changes in her hemoglobin from baseline. 2. Adenomatous colon polyps. 3. History of renal cell cancer, status post right nephrectomy. 4. Hypertension. 5. Hyperlipidemia. 6. History of right carotid artery aneurysm, occurring in 2008. 7. History breast cancer, status post resection in 2001. 8. Hypothyroidism, on replacement therapy.  HOSPITAL COURSE:  [name] is a 70 year old female who had an EGD and colonoscopy by Dr. Deatra Ina on the day prior to this admission.  The 3 cm transverse colon polyp was lifted by submucosal injection and then removed in piecemeal fashion.  She also had removal of a 15 mm polyp in the ascending colon.  Upper endoscopy was done because of a previously noted duodenal ulcer found by the patient's primary gastroenterologist, Dr. Sharlett Iles, in June 2012.  Initially, the ulcer was thought to be a mass, but biopsies just showed changes of acute ulceration.  Her repeat EGD  was  negative for any residual ulcers.  The patient tolerated her procedures well, was discharged home, but then developed some bright red blood around 2 o'clock in the morning.  She continued to have bloody stools up until around 11 a.m.  The patient was advised by our office to go to the emergency department where we admitted her for observation.  On admission, her white count was 10, hemoglobin 10.5.  The patient was hemodynamically stable. She did have mild elevation in her BUN and creatinine being 24 and 1.34 respectively.  The patient was volume repleted and repeat renal function studies are pending at the time of discharge.  The patient came in with a hemoglobin of 10.5, which fell to 9.5  at its lowest point. On the days  of discharge hbg was 10.1, she didn't require any blood transfusions. The patient was discharged home in stable condition on above medications.  She would call our office for any further rectal bleeding.     Tye Savoy, NP   ______________________________ Sandy Salaam. Deatra Ina, MD,FACG    PG/MEDQ  D:  07/25/2011  T:  07/26/2011  Job:  [id number]  Electronically Signed by Tye Savoy NP on 08/07/2011 01:50:00 PM Electronically Signed by Erskine Emery MDFACG on 08/20/2011 08:39:32 AM

## 2011-08-23 ENCOUNTER — Ambulatory Visit
Admission: RE | Admit: 2011-08-23 | Discharge: 2011-08-23 | Disposition: A | Discharge status: Home / Self Care | Payer: Medicare Other | Source: Ambulatory Visit | Attending: Hematology & Oncology | Admitting: Hematology & Oncology

## 2011-08-23 DIAGNOSIS — Z1231 Encounter for screening mammogram for malignant neoplasm of breast: Secondary | ICD-10-CM

## 2011-08-26 ENCOUNTER — Encounter (HOSPITAL_BASED_OUTPATIENT_CLINIC_OR_DEPARTMENT_OTHER): Payer: Medicare Other | Admitting: Hematology & Oncology

## 2011-08-26 ENCOUNTER — Other Ambulatory Visit: Payer: Self-pay | Admitting: Hematology & Oncology

## 2011-08-26 DIAGNOSIS — Z853 Personal history of malignant neoplasm of breast: Secondary | ICD-10-CM

## 2011-08-26 DIAGNOSIS — N6459 Other signs and symptoms in breast: Secondary | ICD-10-CM

## 2011-08-26 DIAGNOSIS — D509 Iron deficiency anemia, unspecified: Secondary | ICD-10-CM

## 2011-08-26 DIAGNOSIS — E559 Vitamin D deficiency, unspecified: Secondary | ICD-10-CM

## 2011-08-26 DIAGNOSIS — R5383 Other fatigue: Secondary | ICD-10-CM

## 2011-08-26 DIAGNOSIS — R5381 Other malaise: Secondary | ICD-10-CM

## 2011-08-26 DIAGNOSIS — Z85528 Personal history of other malignant neoplasm of kidney: Secondary | ICD-10-CM

## 2011-08-26 LAB — CBC WITH DIFFERENTIAL (CANCER CENTER ONLY)
EOS%: 2.6 % (ref 0.0–7.0)
Eosinophils Absolute: 0.3 10*3/uL (ref 0.0–0.5)
LYMPH#: 1.5 10*3/uL (ref 0.9–3.3)
MCH: 27 pg (ref 26.0–34.0)
MCHC: 33.4 g/dL (ref 32.0–36.0)
MONO%: 8.2 % (ref 0.0–13.0)
NEUT#: 7.2 10*3/uL — ABNORMAL HIGH (ref 1.5–6.5)
Platelets: 243 10*3/uL (ref 145–400)
RBC: 3.78 10*6/uL (ref 3.70–5.32)

## 2011-08-27 LAB — TSH: TSH: 2.712 u[IU]/mL (ref 0.350–4.500)

## 2011-08-27 LAB — COMPREHENSIVE METABOLIC PANEL
ALT: 13 U/L (ref 0–35)
AST: 15 U/L (ref 0–37)
CO2: 25 mEq/L (ref 19–32)
Calcium: 9.3 mg/dL (ref 8.4–10.5)
Chloride: 105 mEq/L (ref 96–112)
Potassium: 4.3 mEq/L (ref 3.5–5.3)
Sodium: 144 mEq/L (ref 135–145)
Total Protein: 6.5 g/dL (ref 6.0–8.3)

## 2011-09-02 ENCOUNTER — Encounter: Payer: Self-pay | Admitting: Gastroenterology

## 2011-09-02 ENCOUNTER — Ambulatory Visit (INDEPENDENT_AMBULATORY_CARE_PROVIDER_SITE_OTHER): Payer: Medicare Other | Admitting: Gastroenterology

## 2011-09-02 DIAGNOSIS — K3189 Other diseases of stomach and duodenum: Secondary | ICD-10-CM

## 2011-09-02 DIAGNOSIS — K319 Disease of stomach and duodenum, unspecified: Secondary | ICD-10-CM

## 2011-09-02 DIAGNOSIS — R197 Diarrhea, unspecified: Secondary | ICD-10-CM

## 2011-09-02 DIAGNOSIS — K6389 Other specified diseases of intestine: Secondary | ICD-10-CM

## 2011-09-02 NOTE — Progress Notes (Signed)
History of Present Illness:  Mrs. Quiros has returned following  colonoscopy with submucosal injection and removal of a large transverse colon polyp. This was complicated by a limited GI bleed 2 days later record requiring an overnight hospital observation. Bleeding stopped spontaneously. Pathology demonstrated adenomatous changes only. She also has a history of a large duodenal ulcer diagnosed by endoscopy in June, 2012. CT demonstrated a mass effect in the area. Plan was for  followup endoscopy which has not yet been done. She remains on Nexium. Her main complaint is fatigue. She has multiple bowel movements a day characterized by passage of small amount of solid stool frequently when she urinates. She is without abdominal pain or urgency.    Review of Systems: Pertinent positive and negative review of systems were noted in the above HPI section. All other review of systems were otherwise negative.    Current Medications, Allergies, Past Medical History, Past Surgical History, Family History and Social History were reviewed in [facility] record  Vital signs were reviewed in today's medical record. Physical Exam: General: Well developed , well nourished, no acute distress

## 2011-09-02 NOTE — Assessment & Plan Note (Signed)
Passage of multiple small-volume solid stools are probably due to IBS.  Recommendations #1 fiber supplementation

## 2011-09-02 NOTE — Patient Instructions (Signed)
Your Endoscopy is scheduled with Dr Sharlett Iles on 09/06/2011 at 1:30pm Separate instructions given

## 2011-09-02 NOTE — Assessment & Plan Note (Signed)
Mass seen on CT scan and duodenal ulcer diagnosed in June, 2012. Biopsies demonstrated inflammatory changes only.  Conditions #1 followup endoscopy with Dr. Sharlett Iles

## 2011-09-02 NOTE — Assessment & Plan Note (Signed)
Status post colonoscopy and polypectomy August, 2012.  Recommendations #1 followup colonoscopy in August, 2013

## 2011-09-06 ENCOUNTER — Other Ambulatory Visit: Payer: Self-pay | Admitting: Gastroenterology

## 2011-09-06 ENCOUNTER — Ambulatory Visit (AMBULATORY_SURGERY_CENTER): Payer: Medicare Other | Admitting: Gastroenterology

## 2011-09-06 ENCOUNTER — Encounter: Payer: Self-pay | Admitting: Gastroenterology

## 2011-09-06 DIAGNOSIS — K319 Disease of stomach and duodenum, unspecified: Secondary | ICD-10-CM | POA: Insufficient documentation

## 2011-09-06 DIAGNOSIS — K269 Duodenal ulcer, unspecified as acute or chronic, without hemorrhage or perforation: Secondary | ICD-10-CM | POA: Insufficient documentation

## 2011-09-06 DIAGNOSIS — K2970 Gastritis, unspecified, without bleeding: Secondary | ICD-10-CM

## 2011-09-06 DIAGNOSIS — K2270 Barrett's esophagus without dysplasia: Secondary | ICD-10-CM

## 2011-09-06 DIAGNOSIS — R1013 Epigastric pain: Secondary | ICD-10-CM

## 2011-09-06 DIAGNOSIS — K3189 Other diseases of stomach and duodenum: Secondary | ICD-10-CM

## 2011-09-06 DIAGNOSIS — K209 Esophagitis, unspecified without bleeding: Secondary | ICD-10-CM | POA: Insufficient documentation

## 2011-09-06 MED ORDER — SODIUM CHLORIDE 0.9 % IV SOLN: 500.0000 mL | INTRAVENOUS | Status: DC

## 2011-09-06 MED ORDER — ESOMEPRAZOLE MAGNESIUM 40 MG PO CPDR: 40.0000 mg | DELAYED_RELEASE_CAPSULE | Freq: Every day | ORAL | Status: DC

## 2011-09-06 NOTE — Progress Notes (Signed)
Patient with congested cough, husband stating she has had that cough.Cough is not new.

## 2011-09-06 NOTE — Patient Instructions (Signed)
Follow your discharge instructions.  Continue your medications.  Await biopsy results.

## 2011-09-09 ENCOUNTER — Encounter: Payer: Self-pay | Admitting: Gastroenterology

## 2011-09-09 ENCOUNTER — Telehealth: Payer: Self-pay

## 2011-09-09 DIAGNOSIS — K2970 Gastritis, unspecified, without bleeding: Secondary | ICD-10-CM

## 2011-09-09 DIAGNOSIS — K2990 Gastroduodenitis, unspecified, without bleeding: Secondary | ICD-10-CM

## 2011-09-09 DIAGNOSIS — K219 Gastro-esophageal reflux disease without esophagitis: Secondary | ICD-10-CM

## 2011-09-09 LAB — HELICOBACTER PYLORI SCREEN-BIOPSY: UREASE: NEGATIVE

## 2011-09-09 NOTE — Telephone Encounter (Signed)
Left message to call us with any questions/problems or comments

## 2011-09-15 ENCOUNTER — Encounter: Payer: Self-pay | Admitting: Gastroenterology

## 2011-10-24 ENCOUNTER — Telehealth: Payer: Self-pay | Admitting: Gastroenterology

## 2011-10-24 LAB — HM COLONOSCOPY

## 2011-10-24 NOTE — Telephone Encounter (Signed)
Pt with hx of cerebral aneurysms, renal cell ca, breast ca, Colonoscopy with adenomatous polyps, duodenal ulcer. Pt saw Dr Deatra Ina on 09/02/11 for f/u of submucosal resection and laser therapy . Dr Sharlett Iles did EGD on 09/06/11 to assess for healed duodenal ulcer. Procedure showed healed duodenal ulcer, moderate gastritis, esophagitis, normal duodenal folds; path revealed Barrett's, no evidence of dysplasia or malignancy, neg. For H. Pylori. Pt wants to know if she can stop the Nexium. Please advise.    lmom for pt to call back.

## 2011-10-25 NOTE — Telephone Encounter (Signed)
Notified pt she cannot stop the Nexium at this time d/t her Barrett's. Pt asked how long that would be and I informed her I did not know and suggested she f/u in 6 months if no other problems; pt stated understanding. Reminder sent for May, 2013.

## 2011-10-25 NOTE — Telephone Encounter (Signed)
No,has Barrett's

## 2011-11-29 ENCOUNTER — Ambulatory Visit (INDEPENDENT_AMBULATORY_CARE_PROVIDER_SITE_OTHER): Payer: Medicare Other | Admitting: Cardiovascular Disease

## 2011-11-29 ENCOUNTER — Encounter: Payer: Self-pay | Admitting: Cardiovascular Disease

## 2011-11-29 DIAGNOSIS — I10 Essential (primary) hypertension: Secondary | ICD-10-CM

## 2011-11-29 DIAGNOSIS — R609 Edema, unspecified: Secondary | ICD-10-CM

## 2011-11-29 DIAGNOSIS — R011 Cardiac murmur, unspecified: Secondary | ICD-10-CM

## 2011-11-29 DIAGNOSIS — R0602 Shortness of breath: Secondary | ICD-10-CM | POA: Insufficient documentation

## 2011-11-29 NOTE — Progress Notes (Signed)
Patient ID: Cassidy Ortiz, female    DOB: 09-29-1941, 70 y.o.   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 He has gained weight over the past year who presents via referral for lower extremity edema.  Lab work several months ago did show elevated creatinine of 1.5. Baseline appears to be one.  She reports having edema since her colonoscopy with complication postoperatively with bleeding. Since that time she has had more swelling in her legs. She was initially started on Lasix 20 mg daily. 2 weeks ago, the Lasix was increased to 40 mg in the morning and 20 mg in the early afternoon. On the higher regiment, she has had improvement of her edema and currently does not have any leg swelling. She did have shortness of breath with the edema and now her shortness of breath has improved.  Her husband reports that she does have some shortness of breath at baseline which they attribute to weight gain. She does have mild chronic fatigue. She denies any significant chest pain and feels her best now compared to the past several months since the colonoscopy. She has been taking iron for anemia.  She denies any significant cardiac issues in the past. EGD showed a ulcer, H. Pylori negative.  EKG shows normal sinus rhythm with rate 57 beats per minute with no significant ST or T wave changes   Outpatient Encounter Prescriptions as of 11/29/2011  Medication Sig Dispense Refill  . ALPRAZolam (XANAX) 0.25 MG tablet Take 0.25 mg by mouth 2  (two) times daily as needed.       Marland Kitchen aspirin 81 MG tablet Take 81 mg by mouth daily. Takes 2 tablets daily       . Calcium Carbonate-Vitamin D (CALCIUM-VITAMIN D) 500-200 MG-UNIT per tablet Take 1 tablet by mouth 3 (three) times daily with meals.        . carvedilol (COREG) 6.25 MG tablet Take 6.25 mg by mouth 2 (two) times daily with a meal.        . Cholecalciferol (VITAMIN D3) 1000 UNITS CAPS Take 1 capsule by mouth daily.        Marland Kitchen diltiazem (CARDIZEM) 120 MG tablet Take 120 mg by mouth 2 (two) times daily.        Marland Kitchen esomeprazole (NEXIUM) 40 MG capsule Take 1 capsule (40 mg total) by mouth daily.  30 capsule  11  . furosemide (LASIX) 20 MG tablet Take 80 mg by mouth daily.       . hydrALAZINE (APRESOLINE) 25 MG tablet Take 75 mg by mouth 2 (two) times daily.        Marland Kitchen levothyroxine (SYNTHROID, LEVOTHROID) 150 MCG tablet Take 150 mcg by mouth daily.        . Multiple Vitamin (MULTIVITAMIN) tablet Take 1 tablet by mouth daily.        Marland Kitchen rOPINIRole (REQUIP) 1 MG tablet Take 1 mg by mouth at bedtime.        . simvastatin (ZOCOR) 20 MG tablet Take 20 mg by mouth at bedtime.        Marland Kitchen  traZODone (DESYREL) 150 MG tablet Take 150 mg by mouth at bedtime.        . Vilazodone HCl (VIIBRYD) 40 MG TABS Take 1 tablet by mouth daily.        Marland Kitchen zolpidem (AMBIEN) 10 MG tablet Take 10 mg by mouth at bedtime as needed.           Review of Systems  Constitutional: Positive for fatigue.  HENT: Negative.   Eyes: Negative.   Respiratory: Negative.        SOB improved.  Cardiovascular: Negative.        Edema improved   Gastrointestinal: Negative.   Musculoskeletal: Negative.   Skin: Negative.   Neurological: Negative.   Hematological: Negative.   Psychiatric/Behavioral: Negative.   All other systems reviewed and are negative.    BP 132/72  Pulse 57  Ht 5\' 7"  (1.702 m)  Wt 225 lb 12.8 oz (102.422 kg)  BMI 35.37 kg/m2  Physical Exam  Nursing note and vitals reviewed. Constitutional: She is oriented  to person, place, and time. She appears well-developed and well-nourished.  HENT:  Head: Normocephalic.  Nose: Nose normal.  Mouth/Throat: Oropharynx is clear and moist.  Eyes: Conjunctivae are normal. Pupils are equal, round, and reactive to light.  Neck: Normal range of motion. Neck supple. No JVD present.  Cardiovascular: Normal rate, regular rhythm, S1 normal, S2 normal and intact distal pulses.  Exam reveals no gallop and no friction rub.   Murmur heard.  Crescendo systolic murmur is present with a grade of 2/6  Pulmonary/Chest: Effort normal and breath sounds normal. No respiratory distress. She has no wheezes. She has no rales. She exhibits no tenderness.  Abdominal: Soft. Bowel sounds are normal. She exhibits no distension. There is no tenderness.  Musculoskeletal: Normal range of motion. She exhibits no edema and no tenderness.  Lymphadenopathy:    She has no cervical adenopathy.  Neurological: She is alert and oriented to person, place, and time. Coordination normal.  Skin: Skin is warm and dry. No rash noted. No erythema.  Psychiatric: She has a normal mood and affect. Her behavior is normal. Judgment and thought content normal.         Assessment and Plan

## 2011-11-29 NOTE — Assessment & Plan Note (Signed)
Her murmur is secondary to the mild to moderate outflow tract obstruction. It is important that given this, we do not make her hypovolemic which would possibly give her symptoms of dizziness and lightheadedness. Basic metabolic panel pending for today. We will decrease the Lasix back to 20 mg with additional doses as needed for edema.

## 2011-11-29 NOTE — Assessment & Plan Note (Signed)
Edema has resolved on today's visit. She currently feels well and has no complaints apart from fatigue.  I suspect her edema is likely secondary to diastolic dysfunction.  She does have at least mild LVH with a thick myocardium and mild-to-moderate LV outflow tract gradient seen on previous echocardiogram in 2010. No EKG changes concerning for old MI -- he currently feels well with no complaints, we will hold off on any stress testing. We have suggested recheck a basic metabolic panel to evaluate her renal function and ensure we are not over diuresing her. As her edema has resolved, I suggested she decrease her Lasix to 20 mg daily with iron doses as needed for any weight gain or lower extremity edema.  If her edema and shortness of breath recur and is difficult to manage, and asked her to contact our office as we would perform repeat studies such as echocardiogram and possibly a perfusion myocardial stress test

## 2011-11-29 NOTE — Patient Instructions (Addendum)
You are doing well. Decrease the furosemide to 20 mg a day Watch your weight. If the weight goes up  More than 3 lbs, then take a 40 mg furosemide. If the weight goes down below your goal weight, don't take any furosemide.  We will check your CBC and BMP today  Cal the office for worsening shortness of breath or chest pain.   Please call us if you have new issues that need to be addressed before your next appt.  Your physician wants you to follow-up in: 6 months.  You will receive a reminder letter in the mail two months in advance. If you don't receive a letter, please call our office to schedule the follow-up appointment.

## 2011-11-29 NOTE — Assessment & Plan Note (Signed)
Blood pressure is well controlled on today's visit. No changes made to the medications. I agree with the low-dose beta blocker and calcium channel blocker given her HOCM.

## 2011-11-29 NOTE — Assessment & Plan Note (Signed)
Shortness of breath over the past several months is likely multifactorial including anemia, diastolic dysfunction, obesity, deconditioning. I suspect her breathing has improved as her fluid status and anemia has improved.  I have asked her to watch her weight and start walking on a regular basis as tolerated

## 2011-12-11 DIAGNOSIS — G2581 Restless legs syndrome: Secondary | ICD-10-CM | POA: Diagnosis not present

## 2011-12-11 DIAGNOSIS — D649 Anemia, unspecified: Secondary | ICD-10-CM | POA: Diagnosis not present

## 2011-12-11 DIAGNOSIS — F411 Generalized anxiety disorder: Secondary | ICD-10-CM | POA: Diagnosis not present

## 2012-02-11 ENCOUNTER — Telehealth: Payer: Self-pay | Admitting: Hematology & Oncology

## 2012-02-11 NOTE — Telephone Encounter (Signed)
Per MD to sch pt 03/10/12.  i called pt and gave apt date/time.  Pt is aware of apt

## 2012-03-10 ENCOUNTER — Other Ambulatory Visit (HOSPITAL_BASED_OUTPATIENT_CLINIC_OR_DEPARTMENT_OTHER): Payer: Medicare Other | Admitting: Lab

## 2012-03-10 ENCOUNTER — Ambulatory Visit (HOSPITAL_BASED_OUTPATIENT_CLINIC_OR_DEPARTMENT_OTHER): Payer: Medicare Other | Admitting: Hematology & Oncology

## 2012-03-10 VITALS — BP 172/87 | HR 65 | Temp 98.1°F | Ht 67.0 in | Wt 231.0 lb

## 2012-03-10 DIAGNOSIS — D649 Anemia, unspecified: Secondary | ICD-10-CM

## 2012-03-10 DIAGNOSIS — K921 Melena: Secondary | ICD-10-CM | POA: Diagnosis not present

## 2012-03-10 DIAGNOSIS — C50919 Malignant neoplasm of unspecified site of unspecified female breast: Secondary | ICD-10-CM

## 2012-03-10 DIAGNOSIS — Z853 Personal history of malignant neoplasm of breast: Secondary | ICD-10-CM

## 2012-03-10 DIAGNOSIS — D509 Iron deficiency anemia, unspecified: Secondary | ICD-10-CM

## 2012-03-10 DIAGNOSIS — E559 Vitamin D deficiency, unspecified: Secondary | ICD-10-CM | POA: Diagnosis not present

## 2012-03-10 DIAGNOSIS — C649 Malignant neoplasm of unspecified kidney, except renal pelvis: Secondary | ICD-10-CM

## 2012-03-10 DIAGNOSIS — Z85528 Personal history of other malignant neoplasm of kidney: Secondary | ICD-10-CM

## 2012-03-10 DIAGNOSIS — K219 Gastro-esophageal reflux disease without esophagitis: Secondary | ICD-10-CM | POA: Diagnosis not present

## 2012-03-10 DIAGNOSIS — K2970 Gastritis, unspecified, without bleeding: Secondary | ICD-10-CM | POA: Diagnosis not present

## 2012-03-10 LAB — CBC WITH DIFFERENTIAL (CANCER CENTER ONLY)
BASO#: 0 10*3/uL (ref 0.0–0.2)
Eosinophils Absolute: 0.1 10*3/uL (ref 0.0–0.5)
HCT: 32.8 % — ABNORMAL LOW (ref 34.8–46.6)
HGB: 10.1 g/dL — ABNORMAL LOW (ref 11.6–15.9)
LYMPH#: 1.5 10*3/uL (ref 0.9–3.3)
MONO#: 0.7 10*3/uL (ref 0.1–0.9)
NEUT#: 5.9 10*3/uL (ref 1.5–6.5)
NEUT%: 71 % (ref 39.6–80.0)
RBC: 4.44 10*6/uL (ref 3.70–5.32)
WBC: 8.3 10*3/uL (ref 3.9–10.0)

## 2012-03-10 NOTE — Progress Notes (Signed)
CC:   Einar Pheasant, MD  DIAGNOSES: 1. 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 2. Early stage, likely stage I, renal cell carcinoma of the right     kidney. 3. Cerebral aneurysms.  INTERIM HISTORY:  Recurrent therapy/observation.  HISTORY:  [name] comes in for followup.  I saw her back in September.  Since then, she has been doing okay.  She did have a colonoscopy done.  She had bleeding associated with the colonoscopy. She said they did find some polyps.  There is a family history of colon cancer.  Otherwise, she really has had no complaints.  She had her last mammogram back in September of 2012.  The mammogram looked fine.  She has had no change in bowel or bladder habits.  There has been no problem with hot flashes.  She is enjoying playing with her new Melanee Spry and this really keeps her busy.  She has had no problems with fevers, sweats or chills.  PHYSICAL EXAMINATION:  General:  This is a well-developed, well- nourished white female in no obvious distress.  Her weight is going up. Vital signs:  Show temperature 98.1, pulse 65, respiratory rate 24, blood pressure 172/87.  Weight is 231.  Head and neck:  Exam shows a normocephalic, atraumatic skull.  There are no ocular or oral lesions. There are no palpable cervical, supraclavicular lymph nodes.  Lungs: Are clear bilaterally.  She has no rales, wheeze or rhonchi.  Cardiac: Regular rate and rhythm with a normal S1 and S2.  There are no murmurs, rubs or bruits.  Abdomen:  Soft with good bowel sounds.  There is no fluid wave.  She is mildly obese.  She has no guarding or rebound tenderness.  There is no flank masses.  There is no palpable hepatosplenomegaly.  Breast:  Exam shows left breast with no masses, edema or erythema.  There is no left axillary adenopathy.  Right breast is contracted from past surgery and radiation.  There is a well-healed lumpectomy at the 4 o'clock position.  Her nipple is inverted  of the right breast.  There is no erythema or excoriation noted with the areola complex.  No obvious masses noted in the right breast.  There was no right axillary adenopathy.  Back:  Exam shows no tenderness over the spine, ribs or hips.  There is no kyphosis or osteoporotic changes. Extremities:  Shows no clubbing, cyanosis or edema.  Neurological:  Exam shows no focal neurological deficits.  LABORATORY STUDIES:  White cell count is 8.3, hemoglobin 10.1, hematocrit 32.8, platelet count is 260.  MCV is 74.  IMPRESSION:  [name] is a 71 year old white female with history of stage I ductal carcinoma of the right breast.  I think she was diagnosed back in 2001.  This really has not been an issue for her at all.  I believe that she is cured of this.  She then had localized renal cell carcinoma of the right kidney.  I believe this was taken care of by radiofrequency ablation.  This was back in 2008.  Her anemia is a little bit "active".  I suspect that this is all likely iron deficiency.  We did send off iron studies on her.  I suppose that she likely will need IV iron.  We will let her know.  I will see her back in about 2 months' time now so that we can manage her anemia issues.    ______________________________ Volanda Napoleon, M.D. PRE/MEDQ  D:  03/10/2012  T:  03/10/2012  Job:  [id number]  ADDENDUM:  Ferritin is 4.  %Sat is 6.  Will give IV FeraHeme.

## 2012-03-10 NOTE — Progress Notes (Signed)
This office note has been dictated.

## 2012-03-11 LAB — COMPREHENSIVE METABOLIC PANEL
AST: 14 U/L (ref 0–37)
Albumin: 4 g/dL (ref 3.5–5.2)
Alkaline Phosphatase: 100 U/L (ref 39–117)
BUN: 24 mg/dL — ABNORMAL HIGH (ref 6–23)
Potassium: 4.1 mEq/L (ref 3.5–5.3)
Sodium: 144 mEq/L (ref 135–145)
Total Protein: 6.8 g/dL (ref 6.0–8.3)

## 2012-03-11 LAB — FERRITIN: Ferritin: 4 ng/mL — ABNORMAL LOW (ref 10–291)

## 2012-03-11 LAB — IRON AND TIBC
%SAT: 6 % — ABNORMAL LOW (ref 20–55)
UIBC: 353 ug/dL (ref 125–400)

## 2012-03-11 LAB — VITAMIN D 25 HYDROXY (VIT D DEFICIENCY, FRACTURES): Vit D, 25-Hydroxy: 47 ng/mL (ref 30–89)

## 2012-03-16 ENCOUNTER — Telehealth: Payer: Self-pay | Admitting: *Deleted

## 2012-03-16 NOTE — Telephone Encounter (Addendum)
Message copied by Orlando Penner on Mon Mar 16, 2012  5:35 PM ------      Message from: Burney Gauze R      Created: Thu Mar 12, 2012 12:34 PM       Call and tell her that her iron is very low. She will need FeraHeme at 1020 mg. When was her last colonoscopy. We needed to make sure she has stool guaiac cards to check.  Please set her up with iron infusions in one week or so. This message given to pt's husband.  He says her last colonoscopy was 2 months ago.  Will have scheduler call pt and scheduerl the FeraHeme.

## 2012-03-17 ENCOUNTER — Telehealth: Payer: Self-pay | Admitting: Hematology & Oncology

## 2012-03-17 NOTE — Telephone Encounter (Signed)
Pt aware of 4-12 iron infusion

## 2012-03-18 DIAGNOSIS — G4700 Insomnia, unspecified: Secondary | ICD-10-CM | POA: Diagnosis not present

## 2012-03-18 DIAGNOSIS — Z7189 Other specified counseling: Secondary | ICD-10-CM | POA: Diagnosis not present

## 2012-03-18 DIAGNOSIS — R609 Edema, unspecified: Secondary | ICD-10-CM | POA: Diagnosis not present

## 2012-03-20 ENCOUNTER — Ambulatory Visit (HOSPITAL_BASED_OUTPATIENT_CLINIC_OR_DEPARTMENT_OTHER): Payer: Medicare Other

## 2012-03-20 VITALS — BP 152/88 | HR 74 | Temp 99.7°F

## 2012-03-20 DIAGNOSIS — D649 Anemia, unspecified: Secondary | ICD-10-CM | POA: Diagnosis not present

## 2012-03-20 DIAGNOSIS — E559 Vitamin D deficiency, unspecified: Secondary | ICD-10-CM | POA: Diagnosis not present

## 2012-03-20 DIAGNOSIS — Z85528 Personal history of other malignant neoplasm of kidney: Secondary | ICD-10-CM | POA: Diagnosis not present

## 2012-03-20 DIAGNOSIS — K219 Gastro-esophageal reflux disease without esophagitis: Secondary | ICD-10-CM | POA: Diagnosis not present

## 2012-03-20 DIAGNOSIS — K921 Melena: Secondary | ICD-10-CM | POA: Diagnosis not present

## 2012-03-20 DIAGNOSIS — K2970 Gastritis, unspecified, without bleeding: Secondary | ICD-10-CM | POA: Diagnosis not present

## 2012-03-20 DIAGNOSIS — Z853 Personal history of malignant neoplasm of breast: Secondary | ICD-10-CM | POA: Diagnosis not present

## 2012-03-20 MED ORDER — SODIUM CHLORIDE 0.9 % IV SOLN
1020.0000 mg | Freq: Once | INTRAVENOUS | Status: AC
  Administered 2012-03-20: 1020 mg via INTRAVENOUS
  Filled 2012-03-20: qty 34

## 2012-04-23 ENCOUNTER — Telehealth: Payer: Self-pay | Admitting: *Deleted

## 2012-04-23 NOTE — Telephone Encounter (Signed)
Message copied by Lance Morin on Thu Apr 23, 2012 11:19 AM ------      Message from: Lance Morin      Created: Fri Oct 25, 2011  8:36 AM       Call pt to schedule f/u about stopping Nexium- see November note.

## 2012-04-23 NOTE — Telephone Encounter (Signed)
In November , pt wanted to know if she could stop her Nexium and Dr Sharlett Iles replied no d/t 09/05/12 EGD revealing a healed ulcer, but Barrett's esophagus. Dr Sharlett Iles, pt states she's on a lot of meds and would like to stop Nexium if possible. Do you need to see her or can she try to stop the Nexium? Thanks.

## 2012-04-24 NOTE — Telephone Encounter (Signed)
Notified pt Dr Sharlett Iles wants her to stay on Nexium d/t her use of ASA; pt stated understanding.

## 2012-04-24 NOTE — Telephone Encounter (Signed)
lmom for pt to call back. Per Dr Sharlett Iles, she needs to stay on Nexium since she is on Aspirin.

## 2012-04-24 NOTE — Telephone Encounter (Signed)
No ,,she is on asa

## 2012-04-29 ENCOUNTER — Ambulatory Visit: Payer: Self-pay | Admitting: Family Medicine

## 2012-04-29 DIAGNOSIS — E0500 Thyrotoxicosis with diffuse goiter without thyrotoxic crisis or storm: Secondary | ICD-10-CM | POA: Diagnosis not present

## 2012-04-29 DIAGNOSIS — R609 Edema, unspecified: Secondary | ICD-10-CM | POA: Diagnosis not present

## 2012-04-29 DIAGNOSIS — E785 Hyperlipidemia, unspecified: Secondary | ICD-10-CM | POA: Diagnosis not present

## 2012-04-29 DIAGNOSIS — I517 Cardiomegaly: Secondary | ICD-10-CM | POA: Diagnosis not present

## 2012-04-29 DIAGNOSIS — I509 Heart failure, unspecified: Secondary | ICD-10-CM | POA: Diagnosis not present

## 2012-04-29 DIAGNOSIS — N393 Stress incontinence (female) (male): Secondary | ICD-10-CM | POA: Diagnosis not present

## 2012-05-05 DIAGNOSIS — J209 Acute bronchitis, unspecified: Secondary | ICD-10-CM | POA: Diagnosis not present

## 2012-05-11 DIAGNOSIS — J209 Acute bronchitis, unspecified: Secondary | ICD-10-CM | POA: Diagnosis not present

## 2012-05-12 ENCOUNTER — Telehealth: Payer: Self-pay | Admitting: Hematology & Oncology

## 2012-05-12 NOTE — Telephone Encounter (Signed)
Pt called and cx 05/13/12 due to being sick.  She resch for 05/27/12

## 2012-05-13 ENCOUNTER — Ambulatory Visit: Payer: Medicare Other | Admitting: Hematology & Oncology

## 2012-05-13 ENCOUNTER — Other Ambulatory Visit: Payer: Medicare Other | Admitting: Lab

## 2012-05-18 ENCOUNTER — Ambulatory Visit (INDEPENDENT_AMBULATORY_CARE_PROVIDER_SITE_OTHER): Payer: Medicare Other | Admitting: Cardiovascular Disease

## 2012-05-18 ENCOUNTER — Encounter: Payer: Self-pay | Admitting: Cardiovascular Disease

## 2012-05-18 VITALS — BP 128/68 | HR 72 | Ht 67.0 in | Wt 232.2 lb

## 2012-05-18 DIAGNOSIS — I5031 Acute diastolic (congestive) heart failure: Secondary | ICD-10-CM | POA: Insufficient documentation

## 2012-05-18 DIAGNOSIS — I509 Heart failure, unspecified: Secondary | ICD-10-CM

## 2012-05-18 DIAGNOSIS — Z85528 Personal history of other malignant neoplasm of kidney: Secondary | ICD-10-CM

## 2012-05-18 DIAGNOSIS — E559 Vitamin D deficiency, unspecified: Secondary | ICD-10-CM

## 2012-05-18 DIAGNOSIS — I10 Essential (primary) hypertension: Secondary | ICD-10-CM | POA: Diagnosis not present

## 2012-05-18 DIAGNOSIS — K2990 Gastroduodenitis, unspecified, without bleeding: Secondary | ICD-10-CM

## 2012-05-18 DIAGNOSIS — K219 Gastro-esophageal reflux disease without esophagitis: Secondary | ICD-10-CM | POA: Diagnosis not present

## 2012-05-18 DIAGNOSIS — K921 Melena: Secondary | ICD-10-CM

## 2012-05-18 DIAGNOSIS — K2970 Gastritis, unspecified, without bleeding: Secondary | ICD-10-CM | POA: Diagnosis not present

## 2012-05-18 DIAGNOSIS — R609 Edema, unspecified: Secondary | ICD-10-CM | POA: Diagnosis not present

## 2012-05-18 DIAGNOSIS — Z853 Personal history of malignant neoplasm of breast: Secondary | ICD-10-CM

## 2012-05-18 DIAGNOSIS — Q248 Other specified congenital malformations of heart: Secondary | ICD-10-CM

## 2012-05-18 DIAGNOSIS — D649 Anemia, unspecified: Secondary | ICD-10-CM | POA: Diagnosis not present

## 2012-05-18 MED ORDER — FUROSEMIDE 80 MG PO TABS: 80.0000 mg | ORAL_TABLET | Freq: Two times a day (BID) | ORAL | Status: DC | PRN

## 2012-05-18 MED ORDER — ATORVASTATIN CALCIUM 40 MG PO TABS: 40.0000 mg | ORAL_TABLET | Freq: Every day | ORAL | Status: DC

## 2012-05-18 NOTE — Patient Instructions (Signed)
Please start atorvastatin once your simvastatin is done (once a day)  For edema, start lasix/furosemide 80 mg twice a day (8 am and 2 pm) for a few days until edema has resolved Cut back on fluid Anytime the edema comes back, take an extra furosemide in the afternoon  Please call us if you have new issues that need to be addressed before your next appt.  Your physician wants you to follow-up in: 6 months.  You will receive a reminder letter in the mail two months in advance. If you don't receive a letter, please call our office to schedule the follow-up appointment.

## 2012-05-18 NOTE — Assessment & Plan Note (Signed)
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 Also with mild LVH. Will need to continue aggressive diuresis and minimize by mouth fluid intake.

## 2012-05-18 NOTE — Progress Notes (Signed)
Patient ID: Cassidy Ortiz, female    DOB: Oct 19, 1941, 71 y.o.   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 He has gained weight over the past year who presents via referral for lower extremity edema.  Lab work several months ago did show elevated creatinine of 1.5. Baseline appears to be one.  She reports having edema since her colonoscopy with complication postoperatively with bleeding. Since that time she has had more swelling in her legs. On her last clinic visit, her edema was significantly improved.  She presents today and reports that she's had significant swelling over the past week. Her weight is up 7-8 pounds since her last clinic visit. Her husband has been encouraging her to drink more. She is recovering from bronchitis and had several weeks of antibiotics. Leg swelling is moderate and they feel tight. She denies any significant shortness of breath though she does appear to be mildly short of breath on clinical exam today.  She denies any significant chest pain. Problems with anemia before requiring IM    previous EGD showed a ulcer, H. Pylori negative.  EKG shows normal sinus rhythm with rate 72 beats per minute with no significant ST or T wave changes   Outpatient Encounter Prescriptions as of 05/18/2012  Medication Sig Dispense Refill  . ALPRAZolam (XANAX) 0.25 MG tablet Take 0.25 mg by mouth as needed.       Marland Kitchen aspirin 81 MG tablet Take 81 mg by mouth daily. Takes 2 tablets daily      . CALCIUM & MAGNESIUM  CARBONATES PO Take 1,200 mg by mouth daily.      . carvedilol (COREG) 6.25 MG tablet Take 6.25 mg by mouth 2 (two) times daily with a meal.        . diltiazem (CARDIZEM) 120 MG tablet Take 120 mg by mouth 2 (two) times daily.        Marland Kitchen esomeprazole (NEXIUM) 40 MG capsule Take 1 capsule (40 mg total) by mouth daily.  30 capsule  11  . HYDRALAZINE HCL PO Take 75 mg by mouth 2 (two) times daily.      Marland Kitchen levothyroxine (SYNTHROID, LEVOTHROID) 150 MCG tablet Take 150 mcg by mouth daily.        . Multiple Vitamin (MULTIVITAMIN) tablet Take 1 tablet by mouth daily.        . traZODone (DESYREL) 150 MG tablet Take 150 mg by mouth at bedtime.        . Vilazodone HCl (VIIBRYD) 40 MG TABS Take 1 tablet by mouth daily.        Marland Kitchen VITAMIN D, CHOLECALCIFEROL, PO Take 2,000 mg by mouth daily.      Marland Kitchen zolpidem (AMBIEN) 10 MG tablet Take 10 mg by mouth at bedtime as needed.        .  furosemide (LASIX) 80 MG tablet Take 80 mg by mouth daily.      .  simvastatin (ZOCOR) 20 MG tablet Take 20 mg by mouth at bedtime.         Review of Systems  Constitutional:  Negative.   HENT: Negative.   Eyes: Negative.   Respiratory: Negative.        Mild SOB   Cardiovascular: Positive for leg swelling.          Gastrointestinal: Negative.   Musculoskeletal: Negative.   Skin: Negative.   Neurological: Negative.   Hematological: Negative.   Psychiatric/Behavioral: Negative.   All other systems reviewed and are negative.    BP 128/68  Pulse 72  Ht 5\' 7"  (1.702 m)  Wt 232 lb 4 oz (105.348 kg)  BMI 36.38 kg/m2  Physical Exam  Nursing note and vitals reviewed. Constitutional: She is oriented to person, place, and time. She appears well-developed and well-nourished.  HENT:  Head: Normocephalic.  Nose: Nose normal.  Mouth/Throat: Oropharynx is clear and moist.  Eyes: Conjunctivae are normal. Pupils are equal, round, and reactive to light.  Neck: Normal range of motion. Neck supple. No JVD present.  Cardiovascular:  Normal rate, regular rhythm, S1 normal, S2 normal and intact distal pulses.  Exam reveals no gallop and no friction rub.   Murmur heard.  Crescendo systolic murmur is present with a grade of 2/6       1+ pitting edema bilaterally to below the knees  Pulmonary/Chest: Effort normal and breath sounds normal. No respiratory distress. She has no wheezes. She has no rales. She exhibits no tenderness.  Abdominal: Soft. Bowel sounds are normal. She exhibits no distension. There is no tenderness.  Musculoskeletal: Normal range of motion. She exhibits no edema and no tenderness.  Lymphadenopathy:    She has no cervical adenopathy.  Neurological: She is alert and oriented to person, place, and time. Coordination normal.  Skin: Skin is warm and dry. No rash noted. No erythema.  Psychiatric: She has a normal mood and affect. Her behavior is normal. Judgment and thought content normal.         Assessment and Plan

## 2012-05-18 NOTE — Assessment & Plan Note (Signed)
Fluid overload from acute diastolic CHF. Pitting edema on exam. We will increase her Lasix to 80 mg twice a day for several days and decrease the dose to 80 mg once a day once edema has improved. We have instructed her on decreasing her by mouth fluid intake and salt intake.

## 2012-05-18 NOTE — Assessment & Plan Note (Signed)
Recurrence of her edema likely from excess fluid intake and diastolic dysfunction.

## 2012-05-27 ENCOUNTER — Other Ambulatory Visit (HOSPITAL_BASED_OUTPATIENT_CLINIC_OR_DEPARTMENT_OTHER): Payer: Medicare Other | Admitting: Lab

## 2012-05-27 ENCOUNTER — Ambulatory Visit (HOSPITAL_BASED_OUTPATIENT_CLINIC_OR_DEPARTMENT_OTHER): Payer: Medicare Other | Admitting: Hematology & Oncology

## 2012-05-27 VITALS — BP 130/75 | HR 72 | Temp 97.3°F | Ht 67.0 in | Wt 226.0 lb

## 2012-05-27 DIAGNOSIS — D509 Iron deficiency anemia, unspecified: Secondary | ICD-10-CM | POA: Diagnosis not present

## 2012-05-27 DIAGNOSIS — Z85528 Personal history of other malignant neoplasm of kidney: Secondary | ICD-10-CM | POA: Diagnosis not present

## 2012-05-27 DIAGNOSIS — E559 Vitamin D deficiency, unspecified: Secondary | ICD-10-CM | POA: Diagnosis not present

## 2012-05-27 DIAGNOSIS — C649 Malignant neoplasm of unspecified kidney, except renal pelvis: Secondary | ICD-10-CM | POA: Diagnosis not present

## 2012-05-27 DIAGNOSIS — C50919 Malignant neoplasm of unspecified site of unspecified female breast: Secondary | ICD-10-CM

## 2012-05-27 DIAGNOSIS — K2970 Gastritis, unspecified, without bleeding: Secondary | ICD-10-CM | POA: Diagnosis not present

## 2012-05-27 DIAGNOSIS — K921 Melena: Secondary | ICD-10-CM | POA: Diagnosis not present

## 2012-05-27 DIAGNOSIS — I10 Essential (primary) hypertension: Secondary | ICD-10-CM | POA: Diagnosis not present

## 2012-05-27 DIAGNOSIS — E039 Hypothyroidism, unspecified: Secondary | ICD-10-CM | POA: Diagnosis not present

## 2012-05-27 DIAGNOSIS — I509 Heart failure, unspecified: Secondary | ICD-10-CM | POA: Diagnosis not present

## 2012-05-27 DIAGNOSIS — Z853 Personal history of malignant neoplasm of breast: Secondary | ICD-10-CM | POA: Diagnosis not present

## 2012-05-27 DIAGNOSIS — R609 Edema, unspecified: Secondary | ICD-10-CM | POA: Diagnosis not present

## 2012-05-27 DIAGNOSIS — I5031 Acute diastolic (congestive) heart failure: Secondary | ICD-10-CM | POA: Diagnosis not present

## 2012-05-27 DIAGNOSIS — K219 Gastro-esophageal reflux disease without esophagitis: Secondary | ICD-10-CM | POA: Diagnosis not present

## 2012-05-27 DIAGNOSIS — Q248 Other specified congenital malformations of heart: Secondary | ICD-10-CM | POA: Diagnosis not present

## 2012-05-27 DIAGNOSIS — D649 Anemia, unspecified: Secondary | ICD-10-CM | POA: Diagnosis not present

## 2012-05-27 LAB — CBC WITH DIFFERENTIAL (CANCER CENTER ONLY)
BASO%: 0.3 % (ref 0.0–2.0)
LYMPH%: 22.2 % (ref 14.0–48.0)
MCV: 84 fL (ref 81–101)
MONO#: 0.5 10*3/uL (ref 0.1–0.9)
MONO%: 7.2 % (ref 0.0–13.0)
NEUT#: 4.9 10*3/uL (ref 1.5–6.5)
Platelets: 198 10*3/uL (ref 145–400)
RDW: 23.9 % — ABNORMAL HIGH (ref 11.1–15.7)
WBC: 7.2 10*3/uL (ref 3.9–10.0)

## 2012-05-27 LAB — IRON AND TIBC
%SAT: 25 % (ref 20–55)
Iron: 77 ug/dL (ref 42–145)
UIBC: 231 ug/dL (ref 125–400)

## 2012-05-27 LAB — FERRITIN: Ferritin: 26 ng/mL (ref 10–291)

## 2012-05-27 NOTE — Progress Notes (Signed)
CC:   Venia Carbon, MD  DIAGNOSES: 1. Iron deficiency anemia. 2. 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 3. 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 4. Cerebral aneurysms.  CURRENT THERAPY:  The patient received IV iron back on 03/20/2012 with Feraheme, 1020 mg dose.  INTERIM HISTORY:  Ms. Darbonne comes in for followup.  When we last saw her, she was a little bit more anemic.  She did have some bleeding associated with the colonoscopy.  We found that her ferritin was only 4 with an iron saturation of 6.  We did go ahead and give her IV iron. This helped a little bit.  She still feels very tired.  I think that the tiredness is either still iron deficiency or all of her medications.  She does have hypothyroidism.  She is being followed now at [facility] in [place].  When we last checked her TSH back in September, it was okay.  I am not sure that has been checked by her family doctor.  She has had a decent appetite.  She has had no nausea or vomiting.  She has had no fevers, sweats or chills.  She has had no change in bowel or bladder habits.  She did have an episode of what sounds like congestive heart failure.  She was put on a double dose of Lasix.  This has helped.  PHYSICAL EXAMINATION:  This is a somewhat obese white female in no obvious distress.  Vital signs:  97.3, pulse 72, respiratory rate 18, blood pressure 130/75.  Weight is 226.  Head and neck:  Normocephalic, atraumatic skull.  There are no ocular or oral lesions.  There are no palpable cervical or supraclavicular lymph nodes.  Lungs:  Clear bilaterally.  She has no rales, wheezes or rhonchi.  Cardiac:  Regular rate and rhythm with a normal S1 and S2.  She has a 1/6 systolic ejection murmur.  Breasts:  Left breast with no masses, edema or erythema.  There is no left axillary adenopathy.  Right breast shows some slight contraction of the right breast.  She has a  well-healed lumpectomy scar in the right breast at the 4 o'clock position.  No distinct mass noted in the right breast.  She does have chronic nipple inversion.  There is no right axillary adenopathy.  Back:  No tenderness over the spine, ribs, or hips.  Extremities:  Some trace edema in her legs bilaterally.  She has good range motion of the joints.  Skin:  Does show a hyperpigmented lesion behind the knee on the right leg.  This does not appears to be suspicious.  LABORATORY STUDIES:  White cell count is 7.2, hemoglobin 14.4, hematocrit 43.4, platelet count 198.  MCV is 84.  IMPRESSION:  Ms. Biviano is a 71 year old white female with 2 separate malignancies.  She had a stage I breast cancer.  This was diagnosed back in 2001.  She then had a right kidney carcinoma that was taken care of by radiofrequency ablation back in 2008.  Again, her anemia has normalized now.  We will see what her iron studies look like.  I suppose that she may need another dose of iron.  I want to see her back myself in another 3-4 months for followup. Hopefully, she will have this fatigue improved.    ______________________________ Volanda Napoleon, M.D. PRE/MEDQ  D:  05/27/2012  T:  05/27/2012  Job:  [id number]

## 2012-05-27 NOTE — Progress Notes (Signed)
This office note has been dictated.

## 2012-06-03 NOTE — Progress Notes (Signed)
Not my patient as far as I know

## 2012-06-08 ENCOUNTER — Telehealth: Payer: Self-pay | Admitting: *Deleted

## 2012-06-08 NOTE — Telephone Encounter (Signed)
Called patient to let her know that her iron is ok

## 2012-06-08 NOTE — Telephone Encounter (Signed)
Message copied by Rico Ala on Mon Jun 08, 2012 10:34 AM ------      Message from: Volanda Napoleon      Created: Sun Jun 07, 2012  7:52 PM       Call -iron is ok. pete

## 2012-06-30 ENCOUNTER — Telehealth: Payer: Self-pay | Admitting: Cardiovascular Disease

## 2012-06-30 NOTE — Telephone Encounter (Signed)
Pt was put on furosemide 80 mg 2bid and pt states that this is not working. Pt is still swelling in her feet

## 2012-06-30 NOTE — Telephone Encounter (Signed)
Pt called back.  She says she is still having LE edema, has not improved since increasing lasix to BID.  She says it is unchanged.  She denies other symptoms such as worsening sob. She is about out of med and wants to know if she should refill this/switch. I told her I would discuss with Dr. Rockey Situ and let her know.  Understanding verb.

## 2012-06-30 NOTE — Telephone Encounter (Signed)
Please read below and advise. Thanks

## 2012-06-30 NOTE — Telephone Encounter (Signed)
LMTCB

## 2012-07-02 ENCOUNTER — Telehealth: Payer: Self-pay

## 2012-07-02 ENCOUNTER — Other Ambulatory Visit: Payer: Self-pay

## 2012-07-02 DIAGNOSIS — I509 Heart failure, unspecified: Secondary | ICD-10-CM

## 2012-07-02 DIAGNOSIS — R609 Edema, unspecified: Secondary | ICD-10-CM

## 2012-07-02 NOTE — Telephone Encounter (Signed)
Message copied by Minus Liberty on Thu Jul 02, 2012 10:20 AM ------      Message from: [facility], Mandela Bello E      Created: Tue Jun 30, 2012  4:00 PM      Regarding: edema       Discuss with Rockey Situ

## 2012-07-02 NOTE — Telephone Encounter (Signed)
Unable to get response at this time re: work in [place] in [place]. Scheduling will check again with echo dept and call me back.  In the meantime they have save pt a spot for 7/30 at 2 pm in [place]  I gave pt this info and she wishes to go ahead and schedule for 7/30 at 0830 in [place].  She will wait to hear from [place] re: tomm echo in [place].  We have also scheduled her f/u with Dr. Rockey Situ for 7/31 at 2:45. Understanding verb.

## 2012-07-02 NOTE — Telephone Encounter (Signed)
[place] is able to perform echo tomm at 4 pm. I called pt's husband and made him aware. I also r/s appointment with Dr. Rockey Situ for Monday 7/29 at 1100.  Understanding verb.

## 2012-07-02 NOTE — Telephone Encounter (Signed)
Discussed with Dr. Rockey Situ who says to have pt come in for echo then follow up with him. He does not want her to make any med changes until he sees her in f/u after echo.  Pt informed.  I offered her our first available appt for echo Tuesday 7/30 at 0830. She would like this done sooner and later in the day.  I had her hold while I checked with [place] office.

## 2012-07-02 NOTE — Telephone Encounter (Signed)
I think we may have discussed her case earlier Echocardiogram with followup in clinic

## 2012-07-03 ENCOUNTER — Ambulatory Visit (HOSPITAL_COMMUNITY): Payer: Medicare Other | Attending: Cardiology

## 2012-07-03 DIAGNOSIS — I10 Essential (primary) hypertension: Secondary | ICD-10-CM | POA: Insufficient documentation

## 2012-07-03 DIAGNOSIS — R609 Edema, unspecified: Secondary | ICD-10-CM

## 2012-07-03 DIAGNOSIS — I079 Rheumatic tricuspid valve disease, unspecified: Secondary | ICD-10-CM | POA: Insufficient documentation

## 2012-07-03 DIAGNOSIS — I509 Heart failure, unspecified: Secondary | ICD-10-CM | POA: Diagnosis not present

## 2012-07-03 DIAGNOSIS — I421 Obstructive hypertrophic cardiomyopathy: Secondary | ICD-10-CM | POA: Diagnosis not present

## 2012-07-03 DIAGNOSIS — I080 Rheumatic disorders of both mitral and aortic valves: Secondary | ICD-10-CM | POA: Diagnosis not present

## 2012-07-03 NOTE — Progress Notes (Signed)
Echocardiogram performed.  

## 2012-07-06 ENCOUNTER — Ambulatory Visit (INDEPENDENT_AMBULATORY_CARE_PROVIDER_SITE_OTHER): Payer: Medicare Other | Admitting: Cardiovascular Disease

## 2012-07-06 ENCOUNTER — Encounter: Payer: Self-pay | Admitting: Cardiovascular Disease

## 2012-07-06 VITALS — BP 152/84 | HR 69 | Ht 67.0 in | Wt 231.5 lb

## 2012-07-06 DIAGNOSIS — R0602 Shortness of breath: Secondary | ICD-10-CM

## 2012-07-06 DIAGNOSIS — Q248 Other specified congenital malformations of heart: Secondary | ICD-10-CM

## 2012-07-06 DIAGNOSIS — R609 Edema, unspecified: Secondary | ICD-10-CM | POA: Diagnosis not present

## 2012-07-06 DIAGNOSIS — E039 Hypothyroidism, unspecified: Secondary | ICD-10-CM

## 2012-07-06 DIAGNOSIS — D509 Iron deficiency anemia, unspecified: Secondary | ICD-10-CM

## 2012-07-06 DIAGNOSIS — C649 Malignant neoplasm of unspecified kidney, except renal pelvis: Secondary | ICD-10-CM

## 2012-07-06 DIAGNOSIS — I10 Essential (primary) hypertension: Secondary | ICD-10-CM | POA: Diagnosis not present

## 2012-07-06 DIAGNOSIS — C50919 Malignant neoplasm of unspecified site of unspecified female breast: Secondary | ICD-10-CM | POA: Diagnosis not present

## 2012-07-06 MED ORDER — CARVEDILOL 12.5 MG PO TABS: 12.5000 mg | ORAL_TABLET | Freq: Two times a day (BID) | ORAL | Status: DC

## 2012-07-06 MED ORDER — DILTIAZEM HCL ER COATED BEADS 120 MG PO CP24: 120.0000 mg | ORAL_CAPSULE | Freq: Every day | ORAL | Status: DC

## 2012-07-06 MED ORDER — FUROSEMIDE 80 MG PO TABS: 80.0000 mg | ORAL_TABLET | Freq: Two times a day (BID) | ORAL | Status: DC | PRN

## 2012-07-06 NOTE — Assessment & Plan Note (Signed)
I have suggested she monitor her blood pressure with the changes as above.

## 2012-07-06 NOTE — Assessment & Plan Note (Signed)
No improvement in her with increased diuretic dose. Recent echocardiogram does not confirm elevated right ventricular systolic pressures. Symptoms possibly from calcium channel blocker. We'll decrease the diltiazem to 120 mg daily from twice a day. Increase the carvedilol to 12.5 mg twice a day for heart rate control.

## 2012-07-06 NOTE — Assessment & Plan Note (Signed)
Severe LVH with mild LVOT gradient. Relatively asymptomatic.

## 2012-07-06 NOTE — Patient Instructions (Addendum)
You are doing well. Please decrease the diltiazem to one a day Please increase the coreg/carvedilol to 12.5 mg twice a day  For high blood pressure, take an extra hydralazine  We will do blood work today to look at kidney function  Please call us if you have new issues that need to be addressed before your next appt.  Your physician wants you to follow-up in: 6 weeks

## 2012-07-06 NOTE — Assessment & Plan Note (Signed)
Mild shortness of breath likely secondary to deconditioning and obesity. Possible contribution from severe LVH and diastolic dysfunction.

## 2012-07-06 NOTE — Progress Notes (Signed)
Patient ID: Cassidy Ortiz, female    DOB: [birth year], 71 y.o.   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 He has gained weight over the past year who presents via referral for lower extremity edema.  Previous Lab work several months ago showed elevated creatinine of 1.5. Baseline appears to be one.  She reports having edema since her colonoscopy with complication postoperatively with bleeding. Since that time she has had more swelling in her legs. Unless clinic visit, we increased her Lasix to 80 mg twice a day. She has had less than 1 pound weight loss with only minimal improvement of her edema. No significant shortness of breath Echocardiogram showed severe LVH, mild outflow tract gradient. No mention of elevated right ventricular systolic pressures. Previous  Problems with anemia before requiring IM iron, now resolved He is Previous EGD showed a ulcer, H. Pylori negative.  EKG shows normal sinus rhythm with rate 69 beats per minute with no significant ST or T wave changes  Outpatient Encounter Prescriptions as of 07/06/2012  Medication Sig Dispense Refill  . ALPRAZolam (XANAX) 0.25 MG tablet Take 0.25 mg by mouth as needed.       Marland Kitchen aspirin 81 MG tablet Take 81 mg by mouth daily. Takes 2 tablets daily      . atorvastatin (LIPITOR) 40 MG tablet Take 1 tablet (40 mg total) by mouth daily.  90 tablet  3  . CALCIUM & MAGNESIUM CARBONATES PO Take 1,200 mg by mouth daily.      Marland Kitchen esomeprazole (NEXIUM) 40 MG capsule Take  1 capsule (40 mg total) by mouth daily.  30 capsule  11  . furosemide (LASIX) 80 MG tablet Take 1 tablet (80 mg total) by mouth 2 (two) times daily as needed.  180 tablet  3  . HYDRALAZINE HCL PO Take 75 mg by mouth 2 (two) times daily.      Marland Kitchen levothyroxine (SYNTHROID, LEVOTHROID) 150 MCG tablet Take 150 mcg by mouth daily.        . Multiple Vitamin (MULTIVITAMIN) tablet Take 1 tablet by mouth daily.        . traZODone (DESYREL) 150 MG tablet Take 150 mg by mouth at bedtime.        . Vilazodone HCl (VIIBRYD) 40 MG TABS Take 1 tablet by mouth daily.        Marland Kitchen VITAMIN D, CHOLECALCIFEROL, PO Take 2,000 mg by mouth daily.      Marland Kitchen zolpidem (AMBIEN) 10 MG tablet Take 10 mg by mouth at bedtime as needed.        .  carvedilol (COREG) 6.25 MG tablet Take 6.25 mg by mouth 2 (two) times daily with a meal.        . diltiazem (CARDIZEM CD) 120 MG 24 hr capsule 120 mg 2 (two) times daily.         Review of Systems  Constitutional: Positive for fatigue.  HENT: Negative.   Eyes: Negative.   Respiratory: Negative.   Cardiovascular: Positive for leg swelling.  Gastrointestinal: Negative.   Musculoskeletal: Negative.   Skin: Negative.   Neurological: Negative.   Hematological: Negative.   Psychiatric/Behavioral: Negative.   All other systems reviewed and are negative.    BP 152/84  Pulse 69  Ht 5\' 7"  (1.702 m)  Wt 231 lb 8 oz (105.008 kg)  BMI 36.26 kg/m2  Physical Exam  Nursing note and vitals reviewed. Constitutional: She is oriented to person, place, and time. She appears well-developed and well-nourished.       Obese  HENT:  Head: Normocephalic.  Nose: Nose normal.  Mouth/Throat: Oropharynx is clear and moist.  Eyes: Conjunctivae are normal. Pupils are equal, round, and reactive to light.  Neck: Normal range of motion. Neck supple. No JVD present.  Cardiovascular: Normal rate, regular rhythm, S1 normal, S2 normal and intact distal pulses.  Exam reveals no gallop and no friction  rub.   Murmur heard.  Crescendo systolic murmur is present with a grade of 2/6       1+ pitting edema bilaterally to below the knees  Pulmonary/Chest: Effort normal and breath sounds normal. No respiratory distress. She has no wheezes. She has no rales. She exhibits no tenderness.  Abdominal: Soft. Bowel sounds are normal. She exhibits no distension. There is no tenderness.  Musculoskeletal: Normal range of motion. She exhibits edema. She exhibits no tenderness.  Lymphadenopathy:    She has no cervical adenopathy.  Neurological: She is alert and oriented to person, place, and time. Coordination normal.  Skin: Skin is warm and dry. No rash noted. No erythema.  Psychiatric: She has a normal mood and affect. Her behavior is normal. Judgment and thought content normal.         Assessment and Plan

## 2012-07-07 ENCOUNTER — Other Ambulatory Visit (HOSPITAL_COMMUNITY): Payer: Medicare Other

## 2012-07-07 ENCOUNTER — Other Ambulatory Visit: Payer: Medicare Other

## 2012-07-07 LAB — BASIC METABOLIC PANEL
BUN/Creatinine Ratio: 20 (ref 11–26)
BUN: 24 mg/dL (ref 8–27)
CO2: 20 mmol/L (ref 19–28)
Chloride: 106 mmol/L (ref 97–108)
Sodium: 142 mmol/L (ref 134–144)

## 2012-07-08 ENCOUNTER — Ambulatory Visit: Payer: Medicare Other | Admitting: Cardiovascular Disease

## 2012-08-05 ENCOUNTER — Other Ambulatory Visit: Payer: Self-pay | Admitting: Hematology & Oncology

## 2012-08-05 DIAGNOSIS — Z1231 Encounter for screening mammogram for malignant neoplasm of breast: Secondary | ICD-10-CM

## 2012-08-12 ENCOUNTER — Ambulatory Visit (INDEPENDENT_AMBULATORY_CARE_PROVIDER_SITE_OTHER): Payer: Medicare Other | Admitting: Cardiovascular Disease

## 2012-08-12 ENCOUNTER — Encounter: Payer: Self-pay | Admitting: Cardiovascular Disease

## 2012-08-12 VITALS — BP 152/92 | HR 57 | Ht 67.0 in | Wt 232.2 lb

## 2012-08-12 DIAGNOSIS — Z853 Personal history of malignant neoplasm of breast: Secondary | ICD-10-CM

## 2012-08-12 DIAGNOSIS — K921 Melena: Secondary | ICD-10-CM

## 2012-08-12 DIAGNOSIS — D649 Anemia, unspecified: Secondary | ICD-10-CM | POA: Diagnosis not present

## 2012-08-12 DIAGNOSIS — C649 Malignant neoplasm of unspecified kidney, except renal pelvis: Secondary | ICD-10-CM

## 2012-08-12 DIAGNOSIS — I509 Heart failure, unspecified: Secondary | ICD-10-CM

## 2012-08-12 DIAGNOSIS — R609 Edema, unspecified: Secondary | ICD-10-CM | POA: Diagnosis not present

## 2012-08-12 DIAGNOSIS — K2990 Gastroduodenitis, unspecified, without bleeding: Secondary | ICD-10-CM | POA: Diagnosis not present

## 2012-08-12 DIAGNOSIS — I5031 Acute diastolic (congestive) heart failure: Secondary | ICD-10-CM

## 2012-08-12 DIAGNOSIS — K219 Gastro-esophageal reflux disease without esophagitis: Secondary | ICD-10-CM

## 2012-08-12 DIAGNOSIS — C50919 Malignant neoplasm of unspecified site of unspecified female breast: Secondary | ICD-10-CM

## 2012-08-12 DIAGNOSIS — D509 Iron deficiency anemia, unspecified: Secondary | ICD-10-CM

## 2012-08-12 DIAGNOSIS — I10 Essential (primary) hypertension: Secondary | ICD-10-CM

## 2012-08-12 DIAGNOSIS — E039 Hypothyroidism, unspecified: Secondary | ICD-10-CM

## 2012-08-12 DIAGNOSIS — Z85528 Personal history of other malignant neoplasm of kidney: Secondary | ICD-10-CM

## 2012-08-12 DIAGNOSIS — K2970 Gastritis, unspecified, without bleeding: Secondary | ICD-10-CM

## 2012-08-12 DIAGNOSIS — R0602 Shortness of breath: Secondary | ICD-10-CM

## 2012-08-12 DIAGNOSIS — E559 Vitamin D deficiency, unspecified: Secondary | ICD-10-CM

## 2012-08-12 DIAGNOSIS — Q248 Other specified congenital malformations of heart: Secondary | ICD-10-CM

## 2012-08-12 DIAGNOSIS — I421 Obstructive hypertrophic cardiomyopathy: Secondary | ICD-10-CM

## 2012-08-12 MED ORDER — HYDRALAZINE HCL 100 MG PO TABS: 100.0000 mg | ORAL_TABLET | Freq: Three times a day (TID) | ORAL | Status: DC

## 2012-08-12 MED ORDER — CLONIDINE HCL 0.1 MG PO TABS: 0.1000 mg | ORAL_TABLET | Freq: Two times a day (BID) | ORAL | Status: DC

## 2012-08-12 NOTE — Assessment & Plan Note (Signed)
Severe LVH with LV OT, velocity 2 m/s. She is relatively asymptomatic.

## 2012-08-12 NOTE — Progress Notes (Signed)
Patient ID: Cassidy Ortiz, female    DOB: 07-25-1941, 71 y.o.   MRN: [id number]  HPI Comments: Cassidy Ortiz is a pleasant  71 year old woman with history of ruptured right internal carotid artery aneurysm that was coiled in 2008,  status post stent placement for right peri-thalamic artery aneurysm in July 2000 and, residual left internal carotid artery aneurysm, history of HOCM, severe LVH  with mild to moderate gradient, partial nephrectomy, breast and kidney cancer, 40 years of smoking who stopped 4 years ago, GI bleeding after colonoscopy and polypectomy several months ago With hemoglobin 9.5. He has gained weight over the past year who presents via referral for lower extremity edema.  Previous  elevated creatinine of 1.5.   Echocardiogram showes severe LVH, mild outflow tract gradient. No mention of elevated right ventricular systolic pressures. Previous  Problems with anemia before requiring IM iron, now resolved He is Previous EGD showed a ulcer, H. Pylori negative.   we recently decreased her diltiazem down to 120 mg daily. Her edema has improved but continues to be an issue. She continues to take Lasix daily. Blood pressure has been running high at home  EKG shows normal sinus rhythm with rate 57 beats per minute with no significant ST or T wave changes  Outpatient Encounter Prescriptions as of 08/12/2012  Medication Sig Dispense Refill  . ALPRAZolam (XANAX) 0.25 MG tablet Take 0.25 mg by mouth as needed.       Marland Kitchen aspirin 81 MG tablet Take 81 mg by mouth daily. Takes 2 tablets daily      . atorvastatin (LIPITOR) 40 MG tablet Take 1 tablet (40 mg total) by mouth daily.  90 tablet  3  . CALCIUM & MAGNESIUM CARBONATES PO Take 1,200 mg by mouth daily.      . carvedilol (COREG) 12.5 MG tablet Take 1 tablet (12.5 mg total) by mouth 2 (two) times daily with a meal.  180 tablet  3  . esomeprazole (NEXIUM) 40 MG capsule Take 1 capsule (40 mg total) by mouth daily.  30 capsule  11  . furosemide (LASIX)  80 MG tablet Take 1 tablet (80 mg total) by mouth 2 (two) times daily as needed.  180 tablet  3  . levothyroxine (SYNTHROID, LEVOTHROID) 150 MCG tablet Take 150 mcg by mouth daily.        . Multiple Vitamin (MULTIVITAMIN) tablet Take 1 tablet by mouth daily.        . traZODone (DESYREL) 150 MG tablet Take 150 mg by mouth at bedtime.        . Vilazodone HCl (VIIBRYD) 40 MG TABS Take 1 tablet by mouth daily.        Marland Kitchen VITAMIN D, CHOLECALCIFEROL, PO Take 2,000 mg by mouth daily.      Marland Kitchen zolpidem (AMBIEN) 10 MG tablet Take 10 mg by mouth at bedtime as needed.        . diltiazem (CARDIZEM CD) 120 MG 24 hr capsule Take 1 capsule (120 mg total) by mouth daily.  90 capsule  3  .  HYDRALAZINE HCL PO Take 75 mg by mouth 2 (two) times daily.       Review of Systems  Constitutional: Positive for fatigue.  HENT: Negative.   Eyes: Negative.   Respiratory: Negative.   Cardiovascular: Positive for leg swelling.          Gastrointestinal: Negative.   Musculoskeletal: Negative.   Skin: Negative.   Neurological: Negative.   Hematological: Negative.   Psychiatric/Behavioral: Negative.  All other systems reviewed and are negative.    BP 152/92  Pulse 57  Ht 5\' 7"  (1.702 m)  Wt 232 lb 4 oz (105.348 kg)  BMI 36.38 kg/m2  Physical Exam  Nursing note and vitals reviewed. Constitutional: She is oriented to person, place, and time. She appears well-developed and well-nourished.       Obese  HENT:  Head: Normocephalic.  Nose: Nose normal.  Mouth/Throat: Oropharynx is clear and moist.  Eyes: Conjunctivae are normal. Pupils are equal, round, and reactive to light.  Neck: Normal range of motion. Neck supple. No JVD present.  Cardiovascular: Normal rate, regular rhythm, S1 normal, S2 normal and intact distal pulses.  Exam reveals no gallop and no friction rub.   Murmur heard.  Crescendo systolic murmur is present with a grade of 2/6       Trace edema bilaterally to below the knees  Pulmonary/Chest:  Effort normal and breath sounds normal. No respiratory distress. She has no wheezes. She has no rales. She exhibits no tenderness.  Abdominal: Soft. Bowel sounds are normal. She exhibits no distension. There is no tenderness.  Musculoskeletal: Normal range of motion. She exhibits edema. She exhibits no tenderness.  Lymphadenopathy:    She has no cervical adenopathy.  Neurological: She is alert and oriented to person, place, and time. Coordination normal.  Skin: Skin is warm and dry. No rash noted. No erythema.  Psychiatric: She has a normal mood and affect. Her behavior is normal. Judgment and thought content normal.         Assessment and Plan

## 2012-08-12 NOTE — Assessment & Plan Note (Signed)
She continues to have edema. We will hold her diltiazem as edema did start to improve with the lower dose. No improvement with Lasix. Echo does not suggest elevated right heart pressures.

## 2012-08-12 NOTE — Patient Instructions (Addendum)
Stop the diltiazem for a few weeks Increase the hydralazine to 100 mg three times a day Start clonidine 0.1 mg twice a day  Please call us if you have new issues that need to be addressed before your next appt.  Your physician wants you to follow-up in: 3 months.  You will receive a reminder letter in the mail two months in advance. If you don't receive a letter, please call our office to schedule the follow-up appointment.

## 2012-08-12 NOTE — Assessment & Plan Note (Signed)
Blood pressure is an issue. We will start clonidine, increase hydralazine, hold diltiazem for swelling.

## 2012-08-19 DIAGNOSIS — Z23 Encounter for immunization: Secondary | ICD-10-CM | POA: Diagnosis not present

## 2012-08-26 ENCOUNTER — Other Ambulatory Visit (HOSPITAL_BASED_OUTPATIENT_CLINIC_OR_DEPARTMENT_OTHER): Payer: Medicare Other | Admitting: Lab

## 2012-08-26 ENCOUNTER — Ambulatory Visit (HOSPITAL_BASED_OUTPATIENT_CLINIC_OR_DEPARTMENT_OTHER): Payer: Medicare Other | Admitting: Hematology & Oncology

## 2012-08-26 ENCOUNTER — Telehealth: Payer: Self-pay | Admitting: Cardiovascular Disease

## 2012-08-26 VITALS — BP 182/73 | HR 55 | Temp 98.0°F | Resp 20 | Ht 67.0 in | Wt 233.0 lb

## 2012-08-26 DIAGNOSIS — K921 Melena: Secondary | ICD-10-CM | POA: Diagnosis not present

## 2012-08-26 DIAGNOSIS — D509 Iron deficiency anemia, unspecified: Secondary | ICD-10-CM

## 2012-08-26 DIAGNOSIS — C50919 Malignant neoplasm of unspecified site of unspecified female breast: Secondary | ICD-10-CM

## 2012-08-26 DIAGNOSIS — I421 Obstructive hypertrophic cardiomyopathy: Secondary | ICD-10-CM | POA: Diagnosis not present

## 2012-08-26 DIAGNOSIS — Z853 Personal history of malignant neoplasm of breast: Secondary | ICD-10-CM

## 2012-08-26 DIAGNOSIS — D649 Anemia, unspecified: Secondary | ICD-10-CM | POA: Diagnosis not present

## 2012-08-26 DIAGNOSIS — C649 Malignant neoplasm of unspecified kidney, except renal pelvis: Secondary | ICD-10-CM

## 2012-08-26 DIAGNOSIS — E039 Hypothyroidism, unspecified: Secondary | ICD-10-CM | POA: Diagnosis not present

## 2012-08-26 DIAGNOSIS — E559 Vitamin D deficiency, unspecified: Secondary | ICD-10-CM | POA: Diagnosis not present

## 2012-08-26 DIAGNOSIS — R609 Edema, unspecified: Secondary | ICD-10-CM | POA: Diagnosis not present

## 2012-08-26 DIAGNOSIS — Q248 Other specified congenital malformations of heart: Secondary | ICD-10-CM | POA: Diagnosis not present

## 2012-08-26 DIAGNOSIS — I5031 Acute diastolic (congestive) heart failure: Secondary | ICD-10-CM | POA: Diagnosis not present

## 2012-08-26 DIAGNOSIS — K2970 Gastritis, unspecified, without bleeding: Secondary | ICD-10-CM | POA: Diagnosis not present

## 2012-08-26 DIAGNOSIS — I10 Essential (primary) hypertension: Secondary | ICD-10-CM | POA: Diagnosis not present

## 2012-08-26 DIAGNOSIS — E0500 Thyrotoxicosis with diffuse goiter without thyrotoxic crisis or storm: Secondary | ICD-10-CM

## 2012-08-26 DIAGNOSIS — K219 Gastro-esophageal reflux disease without esophagitis: Secondary | ICD-10-CM | POA: Diagnosis not present

## 2012-08-26 DIAGNOSIS — I509 Heart failure, unspecified: Secondary | ICD-10-CM | POA: Diagnosis not present

## 2012-08-26 DIAGNOSIS — Z85528 Personal history of other malignant neoplasm of kidney: Secondary | ICD-10-CM | POA: Diagnosis not present

## 2012-08-26 DIAGNOSIS — R0602 Shortness of breath: Secondary | ICD-10-CM | POA: Diagnosis not present

## 2012-08-26 LAB — COMPREHENSIVE METABOLIC PANEL
AST: 15 U/L (ref 0–37)
Albumin: 4 g/dL (ref 3.5–5.2)
Alkaline Phosphatase: 77 U/L (ref 39–117)
BUN: 23 mg/dL (ref 6–23)
Potassium: 3.8 mEq/L (ref 3.5–5.3)

## 2012-08-26 LAB — CBC WITH DIFFERENTIAL (CANCER CENTER ONLY)
BASO%: 0.6 % (ref 0.0–2.0)
LYMPH%: 24.9 % (ref 14.0–48.0)
MCH: 30.2 pg (ref 26.0–34.0)
MCHC: 33.9 g/dL (ref 32.0–36.0)
MCV: 89 fL (ref 81–101)
MONO%: 8.9 % (ref 0.0–13.0)
Platelets: 180 10*3/uL (ref 145–400)
RDW: 16.8 % — ABNORMAL HIGH (ref 11.1–15.7)

## 2012-08-26 LAB — IRON AND TIBC
%SAT: 33 % (ref 20–55)
Iron: 96 ug/dL (ref 42–145)
UIBC: 193 ug/dL (ref 125–400)

## 2012-08-26 NOTE — Patient Instructions (Signed)
Call with problems  Get BP better controlled

## 2012-08-26 NOTE — Telephone Encounter (Signed)
Pt was at the the oncology office and states that her bp was running in the 180's.

## 2012-08-26 NOTE — Progress Notes (Signed)
This office note has been dictated.

## 2012-08-27 NOTE — Telephone Encounter (Signed)
Discussed with Dr. Rockey Situ who suggests increasing clonidine to 0.2 mg BID, advised to keep track of pt's BPs regularly and call us with report. I will call pt to advise.

## 2012-08-27 NOTE — Telephone Encounter (Signed)
Pt calling back because she hasnt heard anything from the nurse concerning her BP. She says she has a BP monitor at home but has not checked it she doesn't trust it. Told pt that we need for her to check it today and let us know what it is running. She says she just needs nurse to call her and adjust her meds. She has another appt today at another md office and cant wait around to here from the nurse. Pt doesn't have a cell phone either

## 2012-08-27 NOTE — Telephone Encounter (Signed)
Pt says BP at oncology office yesterday was 180 mm hg. I had her check this again on home machine while I was on phone with her. BP at home=190/130. She has not taken her meds this am.  She denies any symptoms of h/a, blurred vision or other symptoms. I advised her to take meds as usual this am but instead of taking clonidine 0.1 mg she should try taking 0.2 mg this am. I will also discuss with Dr. Rockey Situ when he comes in at 1000 this am and call her back with his suggestions. Understanding verb.

## 2012-08-27 NOTE — Telephone Encounter (Signed)
Pt informed. Understanding verb BP now=164/90 She will monitor BP BID and log. She will call us back at beginning of week to give [place] results.

## 2012-08-27 NOTE — Progress Notes (Signed)
DIAGNOSES: 1. 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 2. 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 3. Cerebral aneurysm. 4. Intermittent iron-deficiency anemia.  CURRENT THERAPY:  IV iron as indicated.  INTERIM HISTORY:  [name] comes in for her followup.  She is not doing as well today.  She is complaining of not feeling well.  Apparent. Her blood pressure has been a problem.  Her blood pressure today is 183/83.  We last, I think gave her iron back in April.  When we saw her in June, her ferritin was 26 with an iron saturation of 25%.  She has had her medications readjusted.  Again, blood pressure has been a problem for her.  I think her cardiologist or primary care is dealing with this.  She has had no change in bowel or bladder habits.  She has noticed some leg swelling.  She has not had any kind of rashes.  PHYSICAL EXAMINATION:  This is a well-developed, well-nourished white female in no obvious distress.  Vital signs:  Temperature of 98, pulse 55, respiratory rate 18, blood pressure 182/73.  Weight is 233.  Head and neck:  Normocephalic, atraumatic skull.  There are no ocular or oral lesions.  There are no palpable cervical or supraclavicular lymph nodes. Lungs:  Clear to percussion and auscultation bilaterally.  Cardiac: Regular rate and rhythm with a normal S1 and S2.  There are no murmurs, rubs or bruits.  Abdomen:  Soft, mildly obese.  She has decent bowel sounds.  There is no fluid wave.  There is no palpable hepatosplenomegaly.  Breasts:  Left breast with no masses, edema or erythema.  There is no left axillary adenopathy.  Right breast: Somewhat contracted from past radiation and surgery.  She has a lumpectomy at the I think 5 o'clock position.  There is some slight firmness at the lumpectomy site.  No distinct mass is noted in the right breast.  There is no right axillary adenopathy.  Extremities:  Some trace edema in her legs  bilaterally.  Back:  No tenderness over the spine, ribs, or hips.  Skin:  No rashes, ecchymosis or petechia.  LABORATORY STUDIES:  White cell count is 6.7, hemoglobin 13.3, hematocrit 39.2, platelet count 180.  IMPRESSION:  [name] is a 71 year old white female with a past history of early-stage breast cancer and renal cell carcinoma.  These are really not an issue from my point of view.  I suspect that she is cured of both.  It has now been 12 years since she had the breast cancer.  She underwent ablation of the right kidney cancer back in 2008.  We will see what her iron studies show.  She is certainly not anemic right now.  However, it is possible that there still may be some iron deficiency.  We will plan to get her back in 4 months' time.  I do not think we need any blood work in between visits.    ______________________________ Volanda Napoleon, M.D. PRE/MEDQ  D:  08/26/2012  T:  08/27/2012  Job:  [id number]

## 2012-09-02 ENCOUNTER — Ambulatory Visit (INDEPENDENT_AMBULATORY_CARE_PROVIDER_SITE_OTHER): Payer: Medicare Other

## 2012-09-02 ENCOUNTER — Telehealth: Payer: Self-pay | Admitting: *Deleted

## 2012-09-02 VITALS — BP 120/68 | HR 48 | Ht 67.0 in | Wt 233.5 lb

## 2012-09-02 DIAGNOSIS — R0609 Other forms of dyspnea: Secondary | ICD-10-CM

## 2012-09-02 DIAGNOSIS — I10 Essential (primary) hypertension: Secondary | ICD-10-CM

## 2012-09-02 DIAGNOSIS — R0989 Other specified symptoms and signs involving the circulatory and respiratory systems: Secondary | ICD-10-CM | POA: Diagnosis not present

## 2012-09-02 DIAGNOSIS — E0500 Thyrotoxicosis with diffuse goiter without thyrotoxic crisis or storm: Secondary | ICD-10-CM

## 2012-09-02 DIAGNOSIS — R0600 Dyspnea, unspecified: Secondary | ICD-10-CM

## 2012-09-02 MED ORDER — LEVOTHYROXINE SODIUM 200 MCG PO TABS: 200.0000 ug | ORAL_TABLET | Freq: Every day | ORAL | Status: DC

## 2012-09-02 NOTE — Telephone Encounter (Addendum)
Message copied by Jodelle Green on Wed Sep 02, 2012  1:45 PM ------      Message from: Volanda Napoleon      Created: Tue Sep 01, 2012  8:38 PM       Call - her thyroid is quite low.  Need to increase Synthroid to 265mcg a day.  She really needs to see her primary MD to have this evaluated. Her iron is ok!! Laurey Arrow  Spoke to the pt regarding her results. She is currently taking 150 mcg per day. After several additional calls back from the pt (her bottle says 0.15mg ) & speaking with her husband they verbalized understanding. Will e-prescribe a 1 month supply of 200 mcg (0.2mg ) to her pharmacy to get her through until she sees her new primary care physician at [facility]. Will fax to [phone]. Alesha stated it will be followed up on once it is faxed.

## 2012-09-02 NOTE — Progress Notes (Signed)
Pt c/ elevated BP at home. She was brought in to correlate our BP cuff to her home machine.  BP in office=122/68, home BP cuff shows BP=128/78. HR=48 BPM Pt c/o DOE, worse than usual associated with occasional chest heaviness. Denies CP C/o some LE edema  I explained to pt and her husband I would discuss issues with Dr. Rockey Situ and call them back.  They wish to wait until he comes in office this am and then discuss. They will wait in room.  I discussed with Dr. Rockey Situ who suggests to have pt first try decreasing coreg to 6.25 mg BID. She should continue to monitor BP and HR. If this doesn't help symptoms/HR, she should call us and we will reassess.

## 2012-09-02 NOTE — Patient Instructions (Addendum)
Your physician has recommended you make the following change in your medication:  -Decrease coreg/carvedilol to 6.25 mg twice daily  Continue to monitor Blood pressure and heart rate at home. If heart rate/symptoms do not improve after medication changes, call us.

## 2012-09-03 ENCOUNTER — Telehealth: Payer: Self-pay

## 2012-09-03 NOTE — Telephone Encounter (Signed)
Pt calling to inform me that she got a call from her oncologist yesterday re: lab work. They told her she is hypothyroid and synthroid dose was increased.  She wanted to make [place] aware since this may be causing some of her symptoms of fatigue and sob. I told her I would make Dr. Rockey Situ aware. Understanding verb.

## 2012-09-09 DIAGNOSIS — E785 Hyperlipidemia, unspecified: Secondary | ICD-10-CM | POA: Diagnosis not present

## 2012-09-09 DIAGNOSIS — F329 Major depressive disorder, single episode, unspecified: Secondary | ICD-10-CM | POA: Diagnosis not present

## 2012-09-09 DIAGNOSIS — E0500 Thyrotoxicosis with diffuse goiter without thyrotoxic crisis or storm: Secondary | ICD-10-CM | POA: Diagnosis not present

## 2012-09-09 DIAGNOSIS — F3289 Other specified depressive episodes: Secondary | ICD-10-CM | POA: Diagnosis not present

## 2012-09-09 DIAGNOSIS — F411 Generalized anxiety disorder: Secondary | ICD-10-CM | POA: Diagnosis not present

## 2012-09-16 ENCOUNTER — Ambulatory Visit
Admission: RE | Admit: 2012-09-16 | Discharge: 2012-09-16 | Disposition: A | Discharge status: Home / Self Care | Payer: Medicare Other | Source: Ambulatory Visit | Attending: Hematology & Oncology | Admitting: Hematology & Oncology

## 2012-09-16 DIAGNOSIS — Z1231 Encounter for screening mammogram for malignant neoplasm of breast: Secondary | ICD-10-CM | POA: Diagnosis not present

## 2012-09-23 ENCOUNTER — Ambulatory Visit (INDEPENDENT_AMBULATORY_CARE_PROVIDER_SITE_OTHER): Payer: Medicare Other | Admitting: Cardiovascular Disease

## 2012-09-23 ENCOUNTER — Encounter: Payer: Self-pay | Admitting: Cardiovascular Disease

## 2012-09-23 VITALS — BP 160/84 | HR 61 | Ht 67.0 in | Wt 227.0 lb

## 2012-09-23 DIAGNOSIS — K921 Melena: Secondary | ICD-10-CM | POA: Diagnosis not present

## 2012-09-23 DIAGNOSIS — K219 Gastro-esophageal reflux disease without esophagitis: Secondary | ICD-10-CM | POA: Diagnosis not present

## 2012-09-23 DIAGNOSIS — Z853 Personal history of malignant neoplasm of breast: Secondary | ICD-10-CM

## 2012-09-23 DIAGNOSIS — D649 Anemia, unspecified: Secondary | ICD-10-CM | POA: Diagnosis not present

## 2012-09-23 DIAGNOSIS — I10 Essential (primary) hypertension: Secondary | ICD-10-CM

## 2012-09-23 DIAGNOSIS — R0602 Shortness of breath: Secondary | ICD-10-CM

## 2012-09-23 DIAGNOSIS — I5031 Acute diastolic (congestive) heart failure: Secondary | ICD-10-CM | POA: Diagnosis not present

## 2012-09-23 DIAGNOSIS — I509 Heart failure, unspecified: Secondary | ICD-10-CM | POA: Diagnosis not present

## 2012-09-23 DIAGNOSIS — E559 Vitamin D deficiency, unspecified: Secondary | ICD-10-CM

## 2012-09-23 DIAGNOSIS — C649 Malignant neoplasm of unspecified kidney, except renal pelvis: Secondary | ICD-10-CM

## 2012-09-23 DIAGNOSIS — K2970 Gastritis, unspecified, without bleeding: Secondary | ICD-10-CM | POA: Diagnosis not present

## 2012-09-23 DIAGNOSIS — R0609 Other forms of dyspnea: Secondary | ICD-10-CM

## 2012-09-23 DIAGNOSIS — Q248 Other specified congenital malformations of heart: Secondary | ICD-10-CM

## 2012-09-23 DIAGNOSIS — C50919 Malignant neoplasm of unspecified site of unspecified female breast: Secondary | ICD-10-CM

## 2012-09-23 DIAGNOSIS — D509 Iron deficiency anemia, unspecified: Secondary | ICD-10-CM

## 2012-09-23 DIAGNOSIS — R609 Edema, unspecified: Secondary | ICD-10-CM

## 2012-09-23 DIAGNOSIS — Z85528 Personal history of other malignant neoplasm of kidney: Secondary | ICD-10-CM

## 2012-09-23 DIAGNOSIS — K2990 Gastroduodenitis, unspecified, without bleeding: Secondary | ICD-10-CM | POA: Diagnosis not present

## 2012-09-23 DIAGNOSIS — E039 Hypothyroidism, unspecified: Secondary | ICD-10-CM

## 2012-09-23 DIAGNOSIS — E079 Disorder of thyroid, unspecified: Secondary | ICD-10-CM

## 2012-09-23 DIAGNOSIS — I421 Obstructive hypertrophic cardiomyopathy: Secondary | ICD-10-CM

## 2012-09-23 NOTE — Assessment & Plan Note (Signed)
Blood pressure is mildly elevated. Recheck blood pressure in the 0000000 systolic. We will increase the clonidine to 0.1 mg 3 times a day with her hydralazine. Have asked her to monitor her blood pressure at home

## 2012-09-23 NOTE — Progress Notes (Signed)
Patient ID: Cassidy Ortiz, female    DOB: 1941/10/30, 71 y.o.   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 She initially presented with lower extremity edema.  Previous  elevated creatinine of 1.5.   Echocardiogram showes severe LVH, mild outflow tract gradient. No mention of elevated right ventricular systolic pressures. Previous  Problems with anemia before requiring IM iron, now resolved Cassidy Ortiz is Previous EGD showed a ulcer, H. Pylori negative.  Her last 2 clinic visits, we have decreased her diltiazem and finally discontinued the diltiazem on her last clinic visit. Her edema has resolved. She has decreased her Lasix to 20 mg twice a day. We started her on clonidine 0.1 mg twice a day and increased her hydralazine for blood pressure control. She has not been monitoring her blood pressure. She does have occasional edema in her ankles but much improved from before.  She is concerned about elevated TSH. This was increased to 200 mcg daily for TSH of 14.196 on 08/26/2012. She felt that her on the higher dose thyroid supplement. She reports that Dr. Randel Books decreased the dose back to 150 mcg daily. She has not felt as well on a lower dose.  EKG shows normal sinus rhythm with rate 61 beats per minute with no significant ST or T wave changes  Outpatient Encounter Prescriptions as of 09/23/2012  Medication Sig Dispense Refill  . ALPRAZolam (XANAX) 0.25 MG tablet Take 0.25 mg by mouth as needed.       Marland Kitchen aspirin 81 MG tablet  Take 81 mg by mouth daily. Takes 2 tablets daily      . atorvastatin (LIPITOR) 40 MG tablet Take 1 tablet (40 mg total) by mouth daily.  90 tablet  3  . CALCIUM & MAGNESIUM CARBONATES PO Take 1,200 mg by mouth daily.      . carvedilol (COREG) 6.25 MG tablet Take 6.25 mg by mouth 2 (two) times daily.      . cloNIDine (CATAPRES) 0.1 MG tablet Take 1 tablet (0.1 mg total) by mouth 2 (two) times daily.  180 tablet  3  . furosemide (LASIX) 20 MG tablet Take 20 mg by mouth 2 (two) times daily.      . hydrALAZINE (APRESOLINE) 100 MG tablet Take 1 tablet (100 mg total) by mouth 3 (three) times daily.  270 tablet  3  . levothyroxine (SYNTHROID) 200 MCG tablet Take 1 tablet (200 mcg total) by mouth daily.  30 tablet  0  . Multiple Vitamin (MULTIVITAMIN) tablet Take 1 tablet by mouth daily.        . traZODone (DESYREL) 150 MG tablet Take 150 mg by mouth at bedtime.        . Vilazodone HCl (VIIBRYD) 40 MG TABS Take 1 tablet by mouth daily.        Marland Kitchen VITAMIN D, CHOLECALCIFEROL, PO Take 2,000 mg by mouth daily.      Marland Kitchen zolpidem (AMBIEN) 10 MG tablet Take 10 mg by mouth at bedtime as needed.        Marland Kitchen esomeprazole ([place])  40 MG capsule Take 1 capsule (40 mg total) by mouth daily.  30 capsule  11    Review of Systems  HENT: Negative.   Eyes: Negative.   Respiratory: Negative.   Cardiovascular: Positive for leg swelling.          Gastrointestinal: Negative.   Musculoskeletal: Negative.   Skin: Negative.   Neurological: Negative.   Hematological: Negative.   Psychiatric/Behavioral: Negative.   All other systems reviewed and are negative.    BP 160/84  Pulse 61  Ht 5\' 7"  (1.702 m)  Wt 227 lb (102.967 kg)  BMI 35.55 kg/m2  Physical Exam  Nursing note and vitals reviewed. Constitutional: She is oriented to person, place, and time. She appears well-developed and well-nourished.       Obese  HENT:  Head: Normocephalic.  Nose: Nose normal.  Mouth/Throat: Oropharynx is clear and moist.  Eyes:  Conjunctivae normal are normal. Pupils are equal, round, and reactive to light.  Neck: Normal range of motion. Neck supple. No JVD present.  Cardiovascular: Normal rate, regular rhythm, S1 normal, S2 normal and intact distal pulses.  Exam reveals no gallop and no friction rub.   Murmur heard.  Crescendo systolic murmur is present with a grade of 2/6       Minimal edema noted  Pulmonary/Chest: Effort normal and breath sounds normal. No respiratory distress. She has no wheezes. She has no rales. She exhibits no tenderness.  Abdominal: Soft. Bowel sounds are normal. She exhibits no distension. There is no tenderness.  Musculoskeletal: Normal range of motion. She exhibits no tenderness.  Lymphadenopathy:    She has no cervical adenopathy.  Neurological: She is alert and oriented to person, place, and time. Coordination normal.  Skin: Skin is warm and dry. No rash noted. No erythema.  Psychiatric: She has a normal mood and affect. Her behavior is normal. Judgment and thought content normal.         Assessment and Plan

## 2012-09-23 NOTE — Assessment & Plan Note (Signed)
Edema has resolved by holding her Cardizem. We will hold off on using calcium channel blockers in the future.

## 2012-09-23 NOTE — Assessment & Plan Note (Signed)
I suggested she go back on 200 mcg daily with a recheck of her TSH in several months time. She felt better on the higher dose. TSH was 14 on 150 mcg.

## 2012-09-23 NOTE — Assessment & Plan Note (Signed)
I suggested she take Lasix twice a day only for edema or if she has significant fluid intake. She may only need Lasix daily on a regular basis. If creatinine continues to run borderline high, she may only need Lasix when necessary for edema.

## 2012-09-23 NOTE — Patient Instructions (Addendum)
You are doing well. Please take clonidine one tab three times a day with hydralazine Monitor blood pressure Goal less than 140 on the top number Keep exercising   Please call us if you have new issues that need to be addressed before your next appt.  Your physician wants you to follow-up in: 2 months.

## 2012-10-14 DIAGNOSIS — N183 Chronic kidney disease, stage 3 unspecified: Secondary | ICD-10-CM | POA: Diagnosis not present

## 2012-10-14 DIAGNOSIS — R7309 Other abnormal glucose: Secondary | ICD-10-CM | POA: Diagnosis not present

## 2012-10-14 DIAGNOSIS — F329 Major depressive disorder, single episode, unspecified: Secondary | ICD-10-CM | POA: Diagnosis not present

## 2012-10-14 DIAGNOSIS — E0500 Thyrotoxicosis with diffuse goiter without thyrotoxic crisis or storm: Secondary | ICD-10-CM | POA: Diagnosis not present

## 2012-10-14 DIAGNOSIS — E785 Hyperlipidemia, unspecified: Secondary | ICD-10-CM | POA: Diagnosis not present

## 2012-10-23 ENCOUNTER — Encounter: Payer: Self-pay | Admitting: Internal Medicine

## 2012-10-23 ENCOUNTER — Ambulatory Visit (INDEPENDENT_AMBULATORY_CARE_PROVIDER_SITE_OTHER): Payer: Medicare Other | Admitting: Internal Medicine

## 2012-10-23 VITALS — BP 170/90 | HR 74 | Temp 99.1°F | Ht 66.0 in | Wt 218.5 lb

## 2012-10-23 DIAGNOSIS — I671 Cerebral aneurysm, nonruptured: Secondary | ICD-10-CM

## 2012-10-23 DIAGNOSIS — I10 Essential (primary) hypertension: Secondary | ICD-10-CM

## 2012-10-23 DIAGNOSIS — M799 Soft tissue disorder, unspecified: Secondary | ICD-10-CM

## 2012-10-23 DIAGNOSIS — E039 Hypothyroidism, unspecified: Secondary | ICD-10-CM

## 2012-10-23 DIAGNOSIS — F329 Major depressive disorder, single episode, unspecified: Secondary | ICD-10-CM

## 2012-10-23 DIAGNOSIS — M7989 Other specified soft tissue disorders: Secondary | ICD-10-CM | POA: Insufficient documentation

## 2012-10-23 LAB — COMPREHENSIVE METABOLIC PANEL
AST: 19 U/L (ref 0–37)
Albumin: 4.1 g/dL (ref 3.5–5.2)
BUN: 18 mg/dL (ref 6–23)
Calcium: 9.8 mg/dL (ref 8.4–10.5)
Chloride: 105 mEq/L (ref 96–112)
Potassium: 3.9 mEq/L (ref 3.5–5.1)
Total Protein: 7.3 g/dL (ref 6.0–8.3)

## 2012-10-23 NOTE — Progress Notes (Signed)
Subjective:    Patient ID: Cassidy Ortiz, female    DOB: [birth year], 71 y.o.   MRN: [id number]  HPI 71 year old female with history of breast cancer, renal cancer, cerebral aneurysm, congestive heart failure, hypertension, hypothyroidism, depression presents to establish care. She reports that she is generally been doing well. Her primary concern today is area of fullness or swelling over her right clavicle. This is been present for a few weeks. It is not tender. There is no overlying skin changes. It is over the site of her ventricular shunt. She denies any headache, visual changes, fever. She has never had difficulty with her shunt. She does not currently follow on a regular basis with her neurosurgeon. She last saw her neurosurgeon a few years ago when she had coil placed and cerebral aneurysm. She had a complication of that procedure with bleeding into her right leg but has not followed up since that time. In regards to her cerebral aneurysms, she reports she has one aneurysm which remains and it was recommended that she have coil placed and this aneurysm. She has opted not to do this because of concern about the procedure.  In regards to hypertension and congestive heart failure, she denies any recent chest pain, palpitations, headache, shortness of breath. In regards to depression, she reports that symptoms are well controlled with current medication. She denies any side effects from her medications.  Outpatient Encounter Prescriptions as of 10/23/2012  Medication Sig Dispense Refill  . ALPRAZolam (XANAX) 0.25 MG tablet Take 0.25 mg by mouth as needed.       Marland Kitchen aspirin 81 MG tablet Take 81 mg by mouth daily. Takes 2 tablets daily      . atorvastatin (LIPITOR) 40 MG tablet Take 1 tablet (40 mg total) by mouth daily.  90 tablet  3  . Calcium Carbonate-Vit D-Min (CALCIUM 1200 PO) Take 1 tablet by mouth daily.      . carvedilol (COREG) 6.25 MG tablet Take 6.25 mg by mouth 2 (two) times daily.      .  cloNIDine (CATAPRES) 0.1 MG tablet Take 1 tablet (0.1 mg total) by mouth 2 (two) times daily.  180 tablet  3  . esomeprazole (NEXIUM) 40 MG capsule Take 1 capsule (40 mg total) by mouth daily.  30 capsule  11  . furosemide (LASIX) 80 MG tablet Take 80 mg by mouth 2 (two) times daily.      . hydrALAZINE (APRESOLINE) 100 MG tablet Take 1 tablet (100 mg total) by mouth 3 (three) times daily.  270 tablet  3  . levothyroxine (SYNTHROID, LEVOTHROID) 150 MCG tablet Take 150 mcg by mouth daily.      . Multiple Vitamin (MULTIVITAMIN) tablet Take 1 tablet by mouth daily.        Marland Kitchen rOPINIRole (REQUIP) 1 MG tablet Take 1 mg by mouth daily.      . traZODone (DESYREL) 150 MG tablet Take 150 mg by mouth at bedtime.        . Vilazodone HCl (VIIBRYD) 40 MG TABS Take 1 tablet by mouth daily.        Marland Kitchen VITAMIN D, CHOLECALCIFEROL, PO Take 2,000 mg by mouth daily.      Marland Kitchen zolpidem (AMBIEN) 10 MG tablet Take 10 mg by mouth at bedtime as needed.        . [DISCONTINUED] CALCIUM & MAGNESIUM CARBONATES PO Take 1,200 mg by mouth daily.      . [DISCONTINUED] furosemide (LASIX) 20 MG tablet Take 20 mg by  mouth 2 (two) times daily.      . [DISCONTINUED] levothyroxine (SYNTHROID) 200 MCG tablet Take 1 tablet (200 mcg total) by mouth daily.  30 tablet  0   BP 170/90  Pulse 74  Temp 99.1 F (37.3 C) (Oral)  Ht 5\' 6"  (1.676 m)  Wt 218 lb 8 oz (99.111 kg)  BMI 35.27 kg/m2  SpO2 88%  Review of Systems  Constitutional: Negative for fever, chills, appetite change, fatigue and unexpected weight change.  HENT: Negative for ear pain, congestion, sore throat, trouble swallowing, neck pain, voice change and sinus pressure.   Eyes: Negative for visual disturbance.  Respiratory: Negative for cough, shortness of breath, wheezing and stridor.   Cardiovascular: Negative for chest pain, palpitations and leg swelling.  Gastrointestinal: Negative for nausea, vomiting, abdominal pain, diarrhea, constipation, blood in stool, abdominal  distention and anal bleeding.  Genitourinary: Negative for dysuria and flank pain.  Musculoskeletal: Negative for myalgias, arthralgias and gait problem.  Skin: Negative for color change and rash.  Neurological: Negative for dizziness and headaches.  Hematological: Negative for adenopathy. Does not bruise/bleed easily.  Psychiatric/Behavioral: Negative for suicidal ideas, sleep disturbance and dysphoric mood. The patient is not nervous/anxious.        Objective:   Physical Exam  Constitutional: She is oriented to person, place, and time. She appears well-developed and well-nourished. No distress.  HENT:  Head: Normocephalic and atraumatic.    Right Ear: External ear normal.  Left Ear: External ear normal.  Nose: Nose normal.  Mouth/Throat: Oropharynx is clear and moist. No oropharyngeal exudate.  Eyes: Conjunctivae normal are normal. Pupils are equal, round, and reactive to light. Right eye exhibits no discharge. Left eye exhibits no discharge. No scleral icterus.  Neck: Normal range of motion. Neck supple. No tracheal deviation present. No thyromegaly present.  Cardiovascular: Normal rate, regular rhythm and intact distal pulses.  Exam reveals no gallop and no friction rub.   Murmur heard. Pulmonary/Chest: Effort normal and breath sounds normal. No respiratory distress. She has no wheezes. She has no rales. She exhibits no tenderness.    Musculoskeletal: Normal range of motion. She exhibits no edema and no tenderness.  Lymphadenopathy:    She has no cervical adenopathy.  Neurological: She is alert and oriented to person, place, and time. No cranial nerve deficit. She exhibits normal muscle tone. Coordination normal.  Skin: Skin is warm and dry. No rash noted. She is not diaphoretic. No erythema. No pallor.  Psychiatric: She has a normal mood and affect. Her behavior is normal. Judgment and thought content normal.          Assessment & Plan:

## 2012-10-23 NOTE — Assessment & Plan Note (Signed)
Fullness noted on exam in the right supraclavicular area is concerning for leak of the ventricular shunt. Will get ultrasound for further evaluation. If area appears to be fluid collection, would favor getting shunt study for further evaluation.

## 2012-10-23 NOTE — Assessment & Plan Note (Addendum)
Pt recently had Synthroid dose decreased. TSH is oversuppressed but Free T4 normal. Will continue Synthroid at 137mcg daily for now and plan to repeat TSH and Free T4 in 3 months.

## 2012-10-24 NOTE — Assessment & Plan Note (Signed)
BP elevated today, however pt reports has been well controlled at home. Will continue to monitor for now. Follow up 1 week.

## 2012-10-24 NOTE — Assessment & Plan Note (Signed)
Symptoms well controlled on Viibryd.  Will plan to continue.

## 2012-10-24 NOTE — Assessment & Plan Note (Signed)
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 Will review past notes from neurosurgery.

## 2012-10-25 ENCOUNTER — Encounter: Payer: Self-pay | Admitting: Internal Medicine

## 2012-10-27 ENCOUNTER — Telehealth: Payer: Self-pay | Admitting: Internal Medicine

## 2012-10-27 ENCOUNTER — Ambulatory Visit: Payer: Self-pay | Admitting: Internal Medicine

## 2012-10-27 DIAGNOSIS — R220 Localized swelling, mass and lump, head: Secondary | ICD-10-CM | POA: Diagnosis not present

## 2012-10-27 DIAGNOSIS — R221 Localized swelling, mass and lump, neck: Secondary | ICD-10-CM | POA: Diagnosis not present

## 2012-10-27 NOTE — Telephone Encounter (Signed)
Can you please confirm that pt received this message:  Ultrasound of the neck did not show any mass or fluid collection at the site of swelling. However, I am still concerned about leakage from the shunt. I would like to set up follow up with neurosurgery for possible CT head with shunt evaluation.  Who would you prefer to see for this?

## 2012-10-28 NOTE — Telephone Encounter (Signed)
Patient has appointment  11/22;she stated she will discuss appointment and hold questions till then

## 2012-10-28 NOTE — Telephone Encounter (Signed)
Called patient to inform of ultrasound results she will call back to inquire about possible  neurosurgery appointment.

## 2012-10-30 ENCOUNTER — Ambulatory Visit (INDEPENDENT_AMBULATORY_CARE_PROVIDER_SITE_OTHER): Payer: Medicare Other | Admitting: Internal Medicine

## 2012-10-30 ENCOUNTER — Encounter: Payer: Self-pay | Admitting: Internal Medicine

## 2012-10-30 VITALS — BP 138/82 | HR 80 | Temp 98.8°F | Resp 16 | Wt 216.0 lb

## 2012-10-30 DIAGNOSIS — I671 Cerebral aneurysm, nonruptured: Secondary | ICD-10-CM | POA: Insufficient documentation

## 2012-10-30 DIAGNOSIS — I10 Essential (primary) hypertension: Secondary | ICD-10-CM | POA: Diagnosis not present

## 2012-10-30 DIAGNOSIS — E079 Disorder of thyroid, unspecified: Secondary | ICD-10-CM

## 2012-10-30 DIAGNOSIS — Z982 Presence of cerebrospinal fluid drainage device: Secondary | ICD-10-CM | POA: Insufficient documentation

## 2012-10-30 MED ORDER — CARVEDILOL 6.25 MG PO TABS: 6.2500 mg | ORAL_TABLET | Freq: Two times a day (BID) | ORAL | Status: DC

## 2012-10-30 NOTE — Assessment & Plan Note (Signed)
Pt has h/o cerebral aneurysm x3. Two of the areas have been coiled.  The last area, per pt, was never addressed because pt had significant complications with bleeding after 2nd coiling. She would like to get opinion about necessity of coiling the 3rd aneurysm. Will set up referral to [facility] neurosurgery.

## 2012-10-30 NOTE — Assessment & Plan Note (Signed)
Patient has history of ventriculoperitoneal shunt placed in 2009. She denies any symptoms of headache, fever. Recently, she has had issues with swelling over the shunt site in her right neck. Question if the shunt may have leak. Question if she will need shunt study for further evaluation. Will set up neurosurgery evaluation for this.

## 2012-10-30 NOTE — Assessment & Plan Note (Signed)
TSH slightly low at 0.2. However, patient's dose of medication was just recently changed. Will continue Synthroid 150 mcg daily. Plan to repeat TSH and free T4 in 2 months.

## 2012-10-30 NOTE — Progress Notes (Signed)
Subjective:    Patient ID: Cassidy Ortiz, female    DOB: [birth year], 71 y.o.   MRN: [id number]  HPI 71 YO female with history of cerebral aneurysm x3, breast cancer, kidney cancer, hypothyroidism, HOCM presents for followup. At her last visit, there was concern about swelling over her right neck. She underwent ultrasound of the neck which showed some edema at this site but no obvious fluid collection. Swelling is unchanged since her last visit.  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 She reports that this aneurysm was never coiled because of complications that occurred during coiling of her second cerebral aneurysm, when she developed extensive bleeding into her right leg. She denies any headache, focal neurologic symptoms. She would like to get a second opinion about potential of coiling this aneurysm.  She also had recent lab work performed  To evaluate thyroid function after decrease in her Synthroid from 200-150 mcg daily.TSH of 0.2 and free T4 was normal.  She reports continued shortness of breath which has been chronic for her for several months. In general, this is gradually improving. She denies any chest pain, diaphoresis, cough, fever, chills.   Outpatient Encounter Prescriptions as of 10/30/2012  Medication Sig Dispense Refill  . ALPRAZolam (XANAX) 0.25 MG tablet Take 0.25 mg by mouth as needed.       Marland Kitchen aspirin 81 MG tablet Take 81 mg by mouth daily. Takes 2 tablets daily      . atorvastatin (LIPITOR) 40 MG tablet Take 1 tablet (40 mg total) by mouth daily.  90 tablet  3  . Calcium Carbonate-Vit D-Min (CALCIUM 1200 PO) Take 1 tablet by mouth daily.      . carvedilol (COREG) 6.25 MG tablet Take 1 tablet (6.25 mg total) by mouth 2 (two) times daily.  180 tablet  4  . cloNIDine (CATAPRES) 0.1 MG tablet Take 1 tablet (0.1 mg total) by mouth 2 (two) times daily.  180 tablet  3  . esomeprazole (NEXIUM) 40 MG capsule Take 1 capsule (40 mg total) by mouth  daily.  30 capsule  11  . furosemide (LASIX) 80 MG tablet Take 80 mg by mouth 2 (two) times daily.      . hydrALAZINE (APRESOLINE) 100 MG tablet Take 1 tablet (100 mg total) by mouth 3 (three) times daily.  270 tablet  3  . levothyroxine (SYNTHROID, LEVOTHROID) 150 MCG tablet Take 150 mcg by mouth daily.      . Multiple Vitamin (MULTIVITAMIN) tablet Take 1 tablet by mouth daily.        Marland Kitchen rOPINIRole (REQUIP) 1 MG tablet Take 1 mg by mouth daily.      . traZODone (DESYREL) 150 MG tablet Take 150 mg by mouth at bedtime.        . Vilazodone HCl (VIIBRYD) 40 MG TABS Take 1 tablet by mouth daily.        Marland Kitchen VITAMIN D, CHOLECALCIFEROL, PO Take 2,000 mg by mouth daily.      Marland Kitchen zolpidem (AMBIEN) 10 MG tablet Take 10 mg by mouth at bedtime as needed.        . [DISCONTINUED] carvedilol (COREG) 6.25 MG tablet Take 6.25 mg by mouth 2 (two) times daily.       BP 138/82  Pulse 80  Temp 98.8 F (37.1 C) (Oral)  Resp 16  Wt 216 lb (97.977 kg)  Review of Systems  Constitutional: Negative for fever, chills, appetite change, fatigue and unexpected weight change.  HENT: Negative  for ear pain, congestion, sore throat, trouble swallowing, neck pain, voice change and sinus pressure.   Eyes: Negative for visual disturbance.  Respiratory: Positive for shortness of breath. Negative for cough, wheezing and stridor.   Cardiovascular: Negative for chest pain, palpitations and leg swelling.  Gastrointestinal: Negative for nausea, vomiting, abdominal pain, diarrhea, constipation, blood in stool, abdominal distention and anal bleeding.  Genitourinary: Negative for dysuria and flank pain.  Musculoskeletal: Negative for myalgias, arthralgias and gait problem.  Skin: Negative for color change and rash.  Neurological: Negative for dizziness and headaches.  Hematological: Negative for adenopathy. Does not bruise/bleed easily.  Psychiatric/Behavioral: Negative for suicidal ideas, sleep disturbance and dysphoric mood. The  patient is not nervous/anxious.        Objective:   Physical Exam  Constitutional: She is oriented to person, place, and time. She appears well-developed and well-nourished. No distress.  HENT:  Head: Normocephalic and atraumatic.    Right Ear: External ear normal.  Left Ear: External ear normal.  Nose: Nose normal.  Mouth/Throat: Oropharynx is clear and moist. No oropharyngeal exudate.  Eyes: Conjunctivae normal are normal. Pupils are equal, round, and reactive to light. Right eye exhibits no discharge. Left eye exhibits no discharge. No scleral icterus.  Neck: Normal range of motion. Neck supple. No tracheal deviation present. No thyromegaly present.  Cardiovascular: Normal rate, regular rhythm, normal heart sounds and intact distal pulses.  Exam reveals no gallop and no friction rub.   No murmur heard. Pulmonary/Chest: Effort normal and breath sounds normal. No respiratory distress. She has no wheezes. She has no rales. She exhibits no tenderness.  Musculoskeletal: Normal range of motion. She exhibits no edema and no tenderness.  Lymphadenopathy:    She has no cervical adenopathy.  Neurological: She is alert and oriented to person, place, and time. No cranial nerve deficit. She exhibits normal muscle tone. Coordination normal.  Skin: Skin is warm and dry. No rash noted. She is not diaphoretic. No erythema. No pallor.  Psychiatric: She has a normal mood and affect. Her behavior is normal. Judgment and thought content normal.          Assessment & Plan:

## 2012-11-03 ENCOUNTER — Telehealth: Payer: Self-pay | Admitting: Internal Medicine

## 2012-11-03 NOTE — Telephone Encounter (Signed)
Confirm appointment.

## 2012-11-09 ENCOUNTER — Encounter: Payer: Self-pay | Admitting: Internal Medicine

## 2012-11-11 ENCOUNTER — Ambulatory Visit (INDEPENDENT_AMBULATORY_CARE_PROVIDER_SITE_OTHER): Payer: Medicare Other | Admitting: Cardiovascular Disease

## 2012-11-11 ENCOUNTER — Encounter: Payer: Self-pay | Admitting: Cardiovascular Disease

## 2012-11-11 VITALS — BP 112/80 | HR 46 | Ht 67.0 in

## 2012-11-11 DIAGNOSIS — R011 Cardiac murmur, unspecified: Secondary | ICD-10-CM | POA: Diagnosis not present

## 2012-11-11 DIAGNOSIS — I5031 Acute diastolic (congestive) heart failure: Secondary | ICD-10-CM

## 2012-11-11 DIAGNOSIS — R0602 Shortness of breath: Secondary | ICD-10-CM | POA: Diagnosis not present

## 2012-11-11 DIAGNOSIS — I509 Heart failure, unspecified: Secondary | ICD-10-CM

## 2012-11-11 DIAGNOSIS — I10 Essential (primary) hypertension: Secondary | ICD-10-CM

## 2012-11-11 DIAGNOSIS — I498 Other specified cardiac arrhythmias: Secondary | ICD-10-CM

## 2012-11-11 DIAGNOSIS — R609 Edema, unspecified: Secondary | ICD-10-CM

## 2012-11-11 DIAGNOSIS — R001 Bradycardia, unspecified: Secondary | ICD-10-CM | POA: Insufficient documentation

## 2012-11-11 MED ORDER — CARVEDILOL 6.25 MG PO TABS: 3.1250 mg | ORAL_TABLET | Freq: Two times a day (BID) | ORAL | Status: DC

## 2012-11-11 NOTE — Patient Instructions (Addendum)
You are doing well. Please cut the carvedilol in 1/2 twice a day Monitor your heart rate If it continues to run in the 40s and low 50s, reduce the dose to 1/2 pill once a day  Please call us if you have new issues that need to be addressed before your next appt.  Your physician wants you to follow-up in: 3 months.  You will receive a reminder letter in the mail two months in advance. If you don't receive a letter, please call our office to schedule the follow-up appointment.

## 2012-11-11 NOTE — Assessment & Plan Note (Signed)
She is taking Lasix 20 mg daily. We have suggested she take extra Lasix for any edema or shortness of breath.

## 2012-11-11 NOTE — Progress Notes (Signed)
Patient ID: Cassidy Ortiz, female    DOB: Sep 12, 1941, 71 y.o.   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 Cassidy Ortiz initially presented with lower extremity edema.  Previous  elevated creatinine of 1.5.   Echocardiogram showed severe LVH, mild outflow tract gradient. No mention of elevated right ventricular systolic pressures. Previous  Problems with anemia before requiring IM iron, now resolved  Previous EGD showed  ulcer, H. Pylori negative.  Earlier in 2013, we  decreased her diltiazem and finally discontinued the diltiazem. Her edema has resolved. Cassidy Ortiz has decreased her Lasix to 20 mg once a day. On her last clinic visit, clonidine was increased to 0.1 mg 3 times a day with hydralazine 100 mg 3 times a day. Cassidy Ortiz does not check her blood pressure at home on a regular basis.  Cassidy Ortiz has had some fatigue recently  Cassidy Ortiz is scheduled to have assessment/evaluation at [facility] for history of aneurysms and shunt (leak?).  EKG shows normal sinus rhythm with rate 46 beats per minute with no significant ST or T wave changes  Outpatient Encounter Prescriptions as of 11/11/2012  Medication Sig Dispense Refill  . ALPRAZolam (XANAX) 0.25 MG tablet Take 0.25 mg by mouth as needed.       Marland Kitchen aspirin 81 MG tablet Take 81 mg by mouth daily. Takes 2 tablets daily      . atorvastatin (LIPITOR) 40 MG tablet Take 1 tablet (40 mg total) by mouth daily.  90 tablet  3  . Calcium Carbonate-Vit D-Min (CALCIUM 1200 PO) Take 1 tablet by mouth daily.       . carvedilol (COREG) 6.25 MG tablet Take 0.5 tablets (3.125 mg total) by mouth 2 (two) times daily.  90 tablet  4  . cloNIDine (CATAPRES) 0.1 MG tablet Take 0.1 mg by mouth 3 (three) times daily.      Marland Kitchen esomeprazole (NEXIUM) 40 MG capsule Take 1 capsule (40 mg total) by mouth daily.  30 capsule  11  . furosemide (LASIX) 20 MG tablet Take 20 mg by mouth daily.      . hydrALAZINE (APRESOLINE) 100 MG tablet Take 1 tablet (100 mg total) by mouth 3 (three) times daily.  270 tablet  3  . levothyroxine (SYNTHROID, LEVOTHROID) 150 MCG tablet Take 150 mcg by mouth daily.      . Multiple Vitamin (MULTIVITAMIN) tablet Take 1 tablet by mouth daily.        Marland Kitchen rOPINIRole (REQUIP) 1 MG tablet Take 1 mg by mouth daily.      . traZODone (DESYREL) 150 MG tablet Take 150 mg by mouth at bedtime.        . Vilazodone HCl (VIIBRYD) 40 MG TABS Take 1 tablet by mouth daily.        Marland Kitchen VITAMIN D, CHOLECALCIFEROL, PO Take 2,000 mg by mouth daily.      Marland Kitchen zolpidem (AMBIEN) 10 MG tablet Take 10 mg by mouth at bedtime as needed.        .  carvedilol (COREG) 6.25 MG tablet Take 1 tablet (6.25  mg total) by mouth 2 (two) times daily.  180 tablet  4    Review of Systems  Constitutional: Positive for fatigue.  HENT: Negative.   Eyes: Negative.   Respiratory: Negative.   Cardiovascular:          Gastrointestinal: Negative.   Musculoskeletal: Negative.   Skin: Negative.   Neurological: Negative.   Hematological: Negative.   Psychiatric/Behavioral: Negative.   All other systems reviewed and are negative.   BP 112/80  Pulse 46  Ht 5\' 7"  (1.702 m)  Physical Exam  Nursing note and vitals reviewed. Constitutional: Cassidy Ortiz is oriented to person, place, and time. Cassidy Ortiz appears well-developed and well-nourished.       Obese  HENT:  Head: Normocephalic.  Nose: Nose normal.  Mouth/Throat: Oropharynx is clear and moist.  Eyes: Conjunctivae normal are normal. Pupils are equal, round, and reactive to light.  Neck: Normal range  of motion. Neck supple. No JVD present.  Cardiovascular: Normal rate, regular rhythm, S1 normal, S2 normal and intact distal pulses.  Exam reveals no gallop and no friction rub.   Murmur heard.  Crescendo systolic murmur is present with a grade of 2/6  Pulmonary/Chest: Effort normal and breath sounds normal. No respiratory distress. Cassidy Ortiz has no wheezes. Cassidy Ortiz has no rales. Cassidy Ortiz exhibits no tenderness.  Abdominal: Soft. Bowel sounds are normal. Cassidy Ortiz exhibits no distension. There is no tenderness.  Musculoskeletal: Normal range of motion. Cassidy Ortiz exhibits no edema and no tenderness.  Lymphadenopathy:    Cassidy Ortiz has no cervical adenopathy.  Neurological: Cassidy Ortiz is alert and oriented to person, place, and time. Coordination normal.  Skin: Skin is warm and dry. No rash noted. No erythema.  Psychiatric: Cassidy Ortiz has a normal mood and affect. Her behavior is normal. Judgment and thought content normal.         Assessment and Plan

## 2012-11-11 NOTE — Assessment & Plan Note (Signed)
Essentially resolved without calcium channel blocker.

## 2012-11-11 NOTE — Assessment & Plan Note (Signed)
Blood pressure is well controlled on today's visit. No changes made to the medications. 

## 2012-11-11 NOTE — Assessment & Plan Note (Signed)
Heart rate in the 40s today. Possibly from higher dose clonidine. We will decrease the coronary to 3.125 mg twice a day. If the rate continues to be slow, we will cut this back to once daily and possibly hold the medication if needed.

## 2012-11-12 DIAGNOSIS — I609 Nontraumatic subarachnoid hemorrhage, unspecified: Secondary | ICD-10-CM | POA: Diagnosis not present

## 2012-11-12 DIAGNOSIS — G911 Obstructive hydrocephalus: Secondary | ICD-10-CM | POA: Insufficient documentation

## 2012-11-12 DIAGNOSIS — I517 Cardiomegaly: Secondary | ICD-10-CM | POA: Diagnosis not present

## 2012-11-12 DIAGNOSIS — Z982 Presence of cerebrospinal fluid drainage device: Secondary | ICD-10-CM | POA: Diagnosis not present

## 2012-11-12 DIAGNOSIS — I671 Cerebral aneurysm, nonruptured: Secondary | ICD-10-CM | POA: Insufficient documentation

## 2012-11-23 DIAGNOSIS — T85695A Other mechanical complication of other nervous system device, implant or graft, initial encounter: Secondary | ICD-10-CM | POA: Diagnosis not present

## 2012-11-27 DIAGNOSIS — I2510 Atherosclerotic heart disease of native coronary artery without angina pectoris: Secondary | ICD-10-CM | POA: Diagnosis not present

## 2012-11-27 DIAGNOSIS — I671 Cerebral aneurysm, nonruptured: Secondary | ICD-10-CM | POA: Diagnosis not present

## 2012-12-21 ENCOUNTER — Encounter: Payer: Self-pay | Admitting: Internal Medicine

## 2012-12-21 ENCOUNTER — Other Ambulatory Visit (HOSPITAL_COMMUNITY)
Admission: RE | Admit: 2012-12-21 | Discharge: 2012-12-21 | Disposition: A | Discharge status: Home / Self Care | Payer: Medicare Other | Source: Ambulatory Visit | Attending: Internal Medicine | Admitting: Internal Medicine

## 2012-12-21 ENCOUNTER — Ambulatory Visit (INDEPENDENT_AMBULATORY_CARE_PROVIDER_SITE_OTHER): Payer: Medicare Other | Admitting: Internal Medicine

## 2012-12-21 ENCOUNTER — Encounter: Payer: Medicare Other | Admitting: Internal Medicine

## 2012-12-21 VITALS — BP 122/72 | HR 65 | Temp 98.5°F | Ht 66.0 in | Wt 203.8 lb

## 2012-12-21 DIAGNOSIS — L723 Sebaceous cyst: Secondary | ICD-10-CM | POA: Diagnosis not present

## 2012-12-21 DIAGNOSIS — Z01419 Encounter for gynecological examination (general) (routine) without abnormal findings: Secondary | ICD-10-CM | POA: Insufficient documentation

## 2012-12-21 DIAGNOSIS — Z0000 Encounter for general adult medical examination without abnormal findings: Secondary | ICD-10-CM

## 2012-12-21 DIAGNOSIS — M722 Plantar fascial fibromatosis: Secondary | ICD-10-CM | POA: Diagnosis not present

## 2012-12-21 DIAGNOSIS — Z982 Presence of cerebrospinal fluid drainage device: Secondary | ICD-10-CM | POA: Diagnosis not present

## 2012-12-21 DIAGNOSIS — Z1151 Encounter for screening for human papillomavirus (HPV): Secondary | ICD-10-CM | POA: Insufficient documentation

## 2012-12-21 DIAGNOSIS — E039 Hypothyroidism, unspecified: Secondary | ICD-10-CM

## 2012-12-21 NOTE — Assessment & Plan Note (Signed)
Due for recheck of TSH. Pt would prefer to hold off and allow labs to be drawn with oncologist at scheduled visit. Will follow.

## 2012-12-21 NOTE — Assessment & Plan Note (Signed)
General medical exam normal today including pelvic exam. PAP is pending. Breast exam deferred as per pt preference, performed by her oncologist. Health maintenance UTD.  Appropriate screening performed. 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  Follow up 3 months and prn.

## 2012-12-21 NOTE — Assessment & Plan Note (Signed)
S/p evaluation at [facility]. Shunt non-functional however plan to leave shunt in place with no further intervention at this time.

## 2012-12-21 NOTE — Assessment & Plan Note (Signed)
Symptoms consistent with plantar fasciitis right foot. Encouraged better, more supportive footwear. Encouraged icing with water bottle. Will use Tylenol prn pain. Will avoid use of NSAIDS given issue with hypertension, IC aneurysm. Discussed referral to podiatry if symptoms not improving.

## 2012-12-21 NOTE — Assessment & Plan Note (Signed)
Right upper back sebaceous cyst. Will set up derm evaluation for resection.

## 2012-12-21 NOTE — Progress Notes (Signed)
Subjective:    Patient ID: Cassidy Ortiz, female    DOB: 1941/05/12, 72 y.o.   MRN: [id number]  HPI The patient is here for annual Medicare wellness examination and management of other chronic and acute problems.   The risk factors are reflected in the social history.  The roster of all physicians providing medical care to patient - is listed in the Snapshot section of the chart.  Activities of daily living:  The patient is 100% independent in all ADLs: dressing, toileting, feeding as well as independent mobility  Home safety : The patient has smoke detectors in the home. They wear seatbelts.  There are no firearms at home. There is no violence in the home.   There is no risks for hepatitis, STDs or HIV. There is no history of blood transfusion. They have no travel history to infectious disease endemic areas of the world.  The patient has not seen their dentist in the last six month. Wears dentures. They have seen their eye doctor in the last year. [place] They admit to slight hearing difficulty with regard to whispered voices and some television programs.  They have deferred audiologic testing in the last year.   They do not  have excessive sun exposure. Discussed the need for sun protection: hats, long sleeves and use of sunscreen if there is significant sun exposure. Will establish with dermatologist.  Diet: the importance of a healthy diet is discussed. They do have a healthy diet.  The benefits of regular aerobic exercise were discussed. Limited exercise 2/2 Plantar fascitis.  Depression screen: there are no signs or vegative symptoms of depression- irritability, change in appetite, anhedonia, sadness/tearfullness.  Cognitive assessment: the patient manages all their financial and personal affairs and is actively engaged. They could relate day,date,year and events.  The following portions of the patient's history were reviewed and updated as appropriate: allergies,  current medications, past family history, past medical history,  past surgical history, past social history  and problem list.  Visual acuity was not assessed per patient preference since she has regular follow up with her ophthalmologist. Hearing and body mass index were assessed and reviewed.   During the course of the visit the patient was educated and counseled about appropriate screening and preventive services including : fall prevention , diabetes screening, nutrition counseling, colorectal cancer screening, and recommended immunizations.    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 Decision was made to leave shunt in place as she does not appear to have increased intercranial pressure. She also had evaluation of bilateral cerebral aneurysm which were stable. She reports she was instructed to followup in 3 years.  She is also concerned today about several month history of right heel pain. She is concerned this might be plantar fasciitis. The pain is described as sharp pain which is worse after first getting out of a chair or up in the morning. It improves after ambulating for a few minutes. She is not currently taking any medication for this.  Outpatient Encounter Prescriptions as of 12/21/2012  Medication Sig Dispense Refill  . ALPRAZolam (XANAX) 0.25 MG tablet Take 0.25 mg by mouth as needed.       Marland Kitchen aspirin 81 MG tablet Take 81 mg by mouth daily. Takes 2 tablets daily      . atorvastatin (LIPITOR) 40 MG tablet Take 1 tablet (40 mg total) by mouth daily.  [place]  tablet  3  . Calcium Carbonate-Vit D-Min (CALCIUM 1200 PO) Take 1 tablet by mouth daily.      . carvedilol (COREG) 6.25 MG tablet Take 0.5 tablets (3.125 mg total) by mouth 2 (two) times daily.  90 tablet  4  . cloNIDine (CATAPRES) 0.1 MG tablet Take 0.1 mg by mouth 3 (three) times daily.      Marland Kitchen esomeprazole (NEXIUM) 40 MG capsule Take 1 capsule (40 mg total)  by mouth daily.  30 capsule  11  . furosemide (LASIX) 20 MG tablet Take 20 mg by mouth daily.      . hydrALAZINE (APRESOLINE) 100 MG tablet Take 1 tablet (100 mg total) by mouth 3 (three) times daily.  270 tablet  3  . levothyroxine (SYNTHROID, LEVOTHROID) 150 MCG tablet Take 150 mcg by mouth daily.      . Multiple Vitamin (MULTIVITAMIN) tablet Take 1 tablet by mouth daily.        Marland Kitchen rOPINIRole (REQUIP) 1 MG tablet Take 1 mg by mouth daily.      . traZODone (DESYREL) 150 MG tablet Take 150 mg by mouth at bedtime.        . Vilazodone HCl (VIIBRYD) 40 MG TABS Take 1 tablet by mouth daily.        Marland Kitchen VITAMIN D, CHOLECALCIFEROL, PO Take 2,000 mg by mouth daily.      Marland Kitchen zolpidem (AMBIEN) 10 MG tablet Take 10 mg by mouth at bedtime as needed.         BP 122/72  Pulse 65  Temp 98.5 F (36.9 C) (Oral)  Ht 5\' 6"  (1.676 m)  Wt 203 lb 12 oz (92.42 kg)  BMI 32.89 kg/m2  SpO2 97%  Review of Systems  Constitutional: Negative for fever, chills, appetite change, fatigue and unexpected weight change.  HENT: Negative for ear pain, congestion, sore throat, trouble swallowing, neck pain, voice change and sinus pressure.   Eyes: Negative for visual disturbance.  Respiratory: Negative for cough, shortness of breath, wheezing and stridor.   Cardiovascular: Negative for chest pain, palpitations and leg swelling.  Gastrointestinal: Negative for nausea, vomiting, abdominal pain, diarrhea, constipation, blood in stool, abdominal distention and anal bleeding.  Genitourinary: Negative for dysuria and flank pain.  Musculoskeletal: Positive for myalgias and arthralgias. Negative for gait problem.  Skin: Negative for color change and rash.  Neurological: Negative for dizziness and headaches.  Hematological: Negative for adenopathy. Does not bruise/bleed easily.  Psychiatric/Behavioral: Negative for suicidal ideas, sleep disturbance and dysphoric mood. The patient is not nervous/anxious.        Objective:   Physical  Exam  Constitutional: She is oriented to person, place, and time. She appears well-developed and well-nourished. No distress.  HENT:  Head: Normocephalic and atraumatic.  Right Ear: External ear normal.  Left Ear: External ear normal.  Nose: Nose normal.  Mouth/Throat: Oropharynx is clear and moist. No oropharyngeal exudate.  Eyes: Conjunctivae normal are normal. Pupils are equal, round, and reactive to light. Right eye exhibits no discharge. Left eye exhibits no discharge. No scleral icterus.  Neck: Normal range of motion. Neck supple. No tracheal deviation present. No thyromegaly present.  Cardiovascular: Normal rate, regular rhythm, normal heart sounds and intact distal pulses.  Exam reveals no gallop and no friction rub.   No murmur heard. Pulmonary/Chest: Effort normal and breath sounds normal. No respiratory distress. She has no wheezes. She has no rales. She exhibits no tenderness.  Genitourinary: Vagina normal. There is no rash, tenderness, lesion or  injury on the right labia. There is no rash, tenderness, lesion or injury on the left labia. Uterus is not deviated, not enlarged, not fixed and not tender. Cervix exhibits no motion tenderness, no discharge and no friability. Right adnexum displays no mass, no tenderness and no fullness. Left adnexum displays no mass, no tenderness and no fullness. No erythema, tenderness or bleeding around the vagina. No signs of injury around the vagina. No vaginal discharge found.  Musculoskeletal: Normal range of motion. She exhibits no edema and no tenderness.  Lymphadenopathy:    She has no cervical adenopathy.  Neurological: She is alert and oriented to person, place, and time. No cranial nerve deficit. She exhibits normal muscle tone. Coordination normal.  Skin: Skin is warm and dry. No rash noted. She is not diaphoretic. No erythema. No pallor.  Psychiatric: She has a normal mood and affect. Her behavior is normal. Judgment and thought content  normal.          Assessment & Plan:

## 2012-12-23 ENCOUNTER — Other Ambulatory Visit (HOSPITAL_BASED_OUTPATIENT_CLINIC_OR_DEPARTMENT_OTHER): Payer: Medicare Other | Admitting: Lab

## 2012-12-23 ENCOUNTER — Encounter: Payer: Medicare Other | Admitting: Internal Medicine

## 2012-12-23 ENCOUNTER — Ambulatory Visit (HOSPITAL_BASED_OUTPATIENT_CLINIC_OR_DEPARTMENT_OTHER): Payer: Medicare Other | Admitting: Medical

## 2012-12-23 VITALS — BP 123/62 | HR 60 | Temp 98.4°F | Resp 18 | Ht 64.0 in | Wt 204.0 lb

## 2012-12-23 DIAGNOSIS — E559 Vitamin D deficiency, unspecified: Secondary | ICD-10-CM | POA: Diagnosis not present

## 2012-12-23 DIAGNOSIS — E0500 Thyrotoxicosis with diffuse goiter without thyrotoxic crisis or storm: Secondary | ICD-10-CM | POA: Diagnosis not present

## 2012-12-23 DIAGNOSIS — C50919 Malignant neoplasm of unspecified site of unspecified female breast: Secondary | ICD-10-CM | POA: Diagnosis not present

## 2012-12-23 DIAGNOSIS — Z853 Personal history of malignant neoplasm of breast: Secondary | ICD-10-CM

## 2012-12-23 DIAGNOSIS — D509 Iron deficiency anemia, unspecified: Secondary | ICD-10-CM

## 2012-12-23 DIAGNOSIS — Z85528 Personal history of other malignant neoplasm of kidney: Secondary | ICD-10-CM

## 2012-12-23 DIAGNOSIS — K921 Melena: Secondary | ICD-10-CM | POA: Diagnosis not present

## 2012-12-23 DIAGNOSIS — D649 Anemia, unspecified: Secondary | ICD-10-CM | POA: Diagnosis not present

## 2012-12-23 DIAGNOSIS — I5031 Acute diastolic (congestive) heart failure: Secondary | ICD-10-CM | POA: Diagnosis not present

## 2012-12-23 DIAGNOSIS — K2970 Gastritis, unspecified, without bleeding: Secondary | ICD-10-CM | POA: Diagnosis not present

## 2012-12-23 DIAGNOSIS — K219 Gastro-esophageal reflux disease without esophagitis: Secondary | ICD-10-CM | POA: Diagnosis not present

## 2012-12-23 DIAGNOSIS — K2990 Gastroduodenitis, unspecified, without bleeding: Secondary | ICD-10-CM | POA: Diagnosis not present

## 2012-12-23 DIAGNOSIS — I10 Essential (primary) hypertension: Secondary | ICD-10-CM | POA: Diagnosis not present

## 2012-12-23 LAB — COMPREHENSIVE METABOLIC PANEL
BUN: 26 mg/dL — ABNORMAL HIGH (ref 6–23)
CO2: 25 mEq/L (ref 19–32)
Calcium: 9.4 mg/dL (ref 8.4–10.5)
Chloride: 104 mEq/L (ref 96–112)
Creatinine, Ser: 1.17 mg/dL — ABNORMAL HIGH (ref 0.50–1.10)
Glucose, Bld: 122 mg/dL — ABNORMAL HIGH (ref 70–99)

## 2012-12-23 LAB — CBC WITH DIFFERENTIAL (CANCER CENTER ONLY)
BASO#: 0 10*3/uL (ref 0.0–0.2)
Eosinophils Absolute: 0.1 10*3/uL (ref 0.0–0.5)
LYMPH%: 16 % (ref 14.0–48.0)
MCH: 29.5 pg (ref 26.0–34.0)
MCV: 87 fL (ref 81–101)
MONO%: 4.7 % (ref 0.0–13.0)
NEUT%: 77.1 % (ref 39.6–80.0)
Platelets: 192 10*3/uL (ref 145–400)
RBC: 4.57 10*6/uL (ref 3.70–5.32)

## 2012-12-23 LAB — IRON AND TIBC
Iron: 84 ug/dL (ref 42–145)
UIBC: 190 ug/dL (ref 125–400)

## 2012-12-23 LAB — TSH: TSH: 3.501 u[IU]/mL (ref 0.350–4.500)

## 2012-12-23 NOTE — Progress Notes (Signed)
Diagnoses: #1.  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 #2.  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 #3.  Cerebral aneurysm. #4.  Intermittent iron deficiency anemia.  Current therapy: IV iron as indicated.  Last dose of IV iron was 03/20/2012.  Interim history: Ms. Cobbs presents today for an office followup visit.  The last, time, she received IV iron was back in April, 2013.  The last iron studies we have on her from September, revealed a ferritin of 22, an iron of 96, with 33% saturation.  Her counts look excellent today.  She does inform me that she is now being seen at [facility] for her aneurysms.  She was just down there and was informed that her shunt is nonfunctional.  However, the plan is to leave the shunt in place with no further intervention for the time being.  There has been no change in size, with aneurysms.  She does have an occasional headache.  In terms of her history of breast cancer, and kidney, cancer, she continues to receive yearly mammograms.  She does not report any recent problems with her breast.  She is not reporting any problems with urination.  She does not report any back or flank pain.  She does not report any abdominal pain.  She reports, that she has a good appetite.  Although she is on a diet.  She denies any nausea, vomiting, diarrhea, constipation.  She denies any obvious, or abnormal bleeding.  She denies any cough, chest pain, shortness of breath, fevers, chills, or night sweats.  She denies any visual changes, or rashes.  She does remain on Synthroid.  She does have a history of Graves' disease.  This is monitored through her primary care physician.   Review of Systems: Constitutional:Negative for malaise/fatigue, fever, chills, weight loss, diaphoresis, activity change, appetite change, and unexpected weight change.  HEENT: Negative for double vision, blurred vision, visual loss, ear pain, tinnitus, congestion, rhinorrhea, epistaxis sore  throat or sinus disease, oral pain/lesion, tongue soreness Respiratory: Negative for cough, chest tightness, shortness of breath, wheezing and stridor.  Cardiovascular: Negative for chest pain, palpitations, leg swelling, orthopnea, PND, DOE or claudication Gastrointestinal: Negative for nausea, vomiting, abdominal pain, diarrhea, constipation, blood in stool, melena, hematochezia, abdominal distention, anal bleeding, rectal pain, anorexia and hematemesis.  Genitourinary: Negative for dysuria, frequency, hematuria,  Musculoskeletal: Negative for myalgias, back pain, joint swelling, arthralgias and gait problem.  Skin: Negative for rash, color change, pallor and wound.  Neurological:. Negative for dizziness/light-headedness, tremors, seizures, syncope, facial asymmetry, speech difficulty, weakness, numbness, headaches and paresthesias.  Hematological: Negative for adenopathy. Does not bruise/bleed easily.  Psychiatric/Behavioral:  Negative for depression, no loss of interest in normal activity or change in sleep pattern.   Physical Exam: This is a pleasant, 72 year old, well-developed, well-nourished, white female, in no obvious distress Vitals: Temperature 90.4 degrees, pulse 60, respirations 18, blood pressure 123/62.  Weight 204 pounds HEENT reveals a normocephalic, atraumatic skull, no scleral icterus, no oral lesions  Neck is supple without any cervical or supraclavicular adenopathy.  Lungs are clear to auscultation bilaterally. There are no wheezes, rales or rhonci Cardiac is regular rate and rhythm with a normal S1 and S2. There are no murmurs, rubs, or bruits.  Abdomen is soft with good bowel sounds, there is no palpable mass. There is no palpable hepatosplenomegaly. There is no palpable fluid wave.  Musculoskeletal no tenderness of the spine, ribs, or hips.  Extremities there are no clubbing,  cyanosis, or edema.  Skin no petechia, purpura or ecchymosis Neurologic is nonfocal. Breast:  Left breast is with no masses, edema, or erythema.  There is no left axillary adenopathy.  Right breast is somewhat contracted from past radiation and surgery.  She has a lumpectomy at the 5:00 position.  There is some slight firmness at the lumpectomy site.  No distinct masses noted in the right breast.  There is no right axillary adenopathy.  Laboratory Data: White count 7.2, hemoglobin 13.5, hematocrit 39.7, MCV 87, platelets 192,000  Current Outpatient Prescriptions on File Prior to Visit  Medication Sig Dispense Refill  . ALPRAZolam (XANAX) 0.25 MG tablet Take 0.25 mg by mouth as needed.       Marland Kitchen aspirin 81 MG tablet Take 81 mg by mouth daily. Takes 2 tablets daily      . atorvastatin (LIPITOR) 40 MG tablet Take 1 tablet (40 mg total) by mouth daily.  90 tablet  3  . Calcium Carbonate-Vit D-Min (CALCIUM 1200 PO) Take 1 tablet by mouth daily.      . carvedilol (COREG) 6.25 MG tablet Take 0.5 tablets (3.125 mg total) by mouth 2 (two) times daily.  90 tablet  4  . cloNIDine (CATAPRES) 0.1 MG tablet Take 0.1 mg by mouth 3 (three) times daily.      Marland Kitchen esomeprazole (NEXIUM) 40 MG capsule Take 1 capsule (40 mg total) by mouth daily.  30 capsule  11  . furosemide (LASIX) 20 MG tablet Take 20 mg by mouth daily.      . hydrALAZINE (APRESOLINE) 100 MG tablet Take 1 tablet (100 mg total) by mouth 3 (three) times daily.  270 tablet  3  . levothyroxine (SYNTHROID, LEVOTHROID) 150 MCG tablet Take 150 mcg by mouth daily.      . Multiple Vitamin (MULTIVITAMIN) tablet Take 1 tablet by mouth daily.        Marland Kitchen rOPINIRole (REQUIP) 1 MG tablet Take 1 mg by mouth daily.      . traZODone (DESYREL) 150 MG tablet Take 150 mg by mouth at bedtime.        . Vilazodone HCl (VIIBRYD) 40 MG TABS Take 1 tablet by mouth daily.        Marland Kitchen VITAMIN D, CHOLECALCIFEROL, PO Take 2,000 mg by mouth daily.      Marland Kitchen zolpidem (AMBIEN) 10 MG tablet Take 10 mg by mouth at bedtime as needed.         Assessment/Plan: This is a pleasant,  72 year old, white female, with the following issues:  #1.  History of early-stage breast cancer.  It has been close to 12 years.  Since her breast cancer.  She will continue with yearly mammograms.  On today's exam, there is no evidence of any relapse or recurrence.  #2.  History of renal cell carcinoma.  She did undergo ablation of the right kidney cancer back in 2008.  On today's exam, there does not appear to be any type of relapse, or recurrence.  #3.  Intermittent iron deficiency.  Her counts look good.  I do not suspect, she will need IV iron.  We are checking an iron panel on her today.  #4.  Followup.  We will follow back up with Ms. Monfils in  4 months, but before then should there be questions or concerns.

## 2012-12-24 ENCOUNTER — Telehealth: Payer: Self-pay | Admitting: *Deleted

## 2012-12-24 ENCOUNTER — Other Ambulatory Visit: Payer: Self-pay | Admitting: *Deleted

## 2012-12-24 DIAGNOSIS — D509 Iron deficiency anemia, unspecified: Secondary | ICD-10-CM

## 2012-12-24 NOTE — Telephone Encounter (Signed)
Called patient to let her know that her thyroid was perfect but her iron was a little low per d.r ennever.  Dr. Marin Olp feels she would benefit from Feraheme 1020 mg IV x 1 .  Set up per scheduler

## 2012-12-24 NOTE — Telephone Encounter (Signed)
Message copied by Rico Ala on Thu Dec 24, 2012 12:38 PM ------      Message from: Burney Gauze R      Created: Wed Dec 23, 2012  6:07 PM       Call - Thyroid is perfect!!! Iron is a little low.  She would benefit from Feraheme at 1020mg  x 1 dose. Please set this up.  Laurey Arrow

## 2012-12-28 ENCOUNTER — Ambulatory Visit (HOSPITAL_BASED_OUTPATIENT_CLINIC_OR_DEPARTMENT_OTHER): Payer: Medicare Other

## 2012-12-28 ENCOUNTER — Encounter: Payer: Medicare Other | Admitting: Internal Medicine

## 2012-12-28 VITALS — BP 128/81 | HR 60 | Temp 97.1°F | Resp 18

## 2012-12-28 DIAGNOSIS — K219 Gastro-esophageal reflux disease without esophagitis: Secondary | ICD-10-CM | POA: Diagnosis not present

## 2012-12-28 DIAGNOSIS — R0602 Shortness of breath: Secondary | ICD-10-CM | POA: Diagnosis not present

## 2012-12-28 DIAGNOSIS — F329 Major depressive disorder, single episode, unspecified: Secondary | ICD-10-CM | POA: Diagnosis not present

## 2012-12-28 DIAGNOSIS — C50919 Malignant neoplasm of unspecified site of unspecified female breast: Secondary | ICD-10-CM | POA: Diagnosis not present

## 2012-12-28 DIAGNOSIS — E079 Disorder of thyroid, unspecified: Secondary | ICD-10-CM | POA: Diagnosis not present

## 2012-12-28 DIAGNOSIS — Z853 Personal history of malignant neoplasm of breast: Secondary | ICD-10-CM | POA: Diagnosis not present

## 2012-12-28 DIAGNOSIS — E039 Hypothyroidism, unspecified: Secondary | ICD-10-CM | POA: Diagnosis not present

## 2012-12-28 DIAGNOSIS — Z982 Presence of cerebrospinal fluid drainage device: Secondary | ICD-10-CM | POA: Diagnosis not present

## 2012-12-28 DIAGNOSIS — I10 Essential (primary) hypertension: Secondary | ICD-10-CM | POA: Diagnosis not present

## 2012-12-28 DIAGNOSIS — I5031 Acute diastolic (congestive) heart failure: Secondary | ICD-10-CM | POA: Diagnosis not present

## 2012-12-28 DIAGNOSIS — R0609 Other forms of dyspnea: Secondary | ICD-10-CM | POA: Diagnosis not present

## 2012-12-28 DIAGNOSIS — K921 Melena: Secondary | ICD-10-CM | POA: Diagnosis not present

## 2012-12-28 DIAGNOSIS — R609 Edema, unspecified: Secondary | ICD-10-CM | POA: Diagnosis not present

## 2012-12-28 DIAGNOSIS — R011 Cardiac murmur, unspecified: Secondary | ICD-10-CM | POA: Diagnosis not present

## 2012-12-28 DIAGNOSIS — K2970 Gastritis, unspecified, without bleeding: Secondary | ICD-10-CM | POA: Diagnosis not present

## 2012-12-28 DIAGNOSIS — M799 Soft tissue disorder, unspecified: Secondary | ICD-10-CM | POA: Diagnosis not present

## 2012-12-28 DIAGNOSIS — E559 Vitamin D deficiency, unspecified: Secondary | ICD-10-CM | POA: Diagnosis not present

## 2012-12-28 DIAGNOSIS — Q248 Other specified congenital malformations of heart: Secondary | ICD-10-CM | POA: Diagnosis not present

## 2012-12-28 DIAGNOSIS — C649 Malignant neoplasm of unspecified kidney, except renal pelvis: Secondary | ICD-10-CM | POA: Diagnosis not present

## 2012-12-28 DIAGNOSIS — D509 Iron deficiency anemia, unspecified: Secondary | ICD-10-CM

## 2012-12-28 DIAGNOSIS — I509 Heart failure, unspecified: Secondary | ICD-10-CM | POA: Diagnosis not present

## 2012-12-28 DIAGNOSIS — D649 Anemia, unspecified: Secondary | ICD-10-CM | POA: Diagnosis not present

## 2012-12-28 DIAGNOSIS — I671 Cerebral aneurysm, nonruptured: Secondary | ICD-10-CM | POA: Diagnosis not present

## 2012-12-28 DIAGNOSIS — I421 Obstructive hypertrophic cardiomyopathy: Secondary | ICD-10-CM | POA: Diagnosis not present

## 2012-12-28 DIAGNOSIS — Z85528 Personal history of other malignant neoplasm of kidney: Secondary | ICD-10-CM | POA: Diagnosis not present

## 2012-12-28 MED ORDER — SODIUM CHLORIDE 0.9 % IV SOLN
INTRAVENOUS | Status: DC
  Administered 2012-12-28: 12:00:00 via INTRAVENOUS

## 2012-12-28 MED ORDER — SODIUM CHLORIDE 0.9 % IV SOLN
1020.0000 mg | Freq: Once | INTRAVENOUS | Status: AC
  Administered 2012-12-28: 1020 mg via INTRAVENOUS
  Filled 2012-12-28: qty 34

## 2012-12-28 NOTE — Patient Instructions (Signed)
Ferumoxytol injection What is this medicine? FERUMOXYTOL is an iron complex. Iron is used to make healthy red blood cells, which carry oxygen and nutrients throughout the body. This medicine is used to treat iron deficiency anemia in people with chronic kidney disease. This medicine may be used for other purposes; ask your health care provider or pharmacist if you have questions. What should I tell my health care provider before I take this medicine? They need to know if you have any of these conditions: -anemia not caused by low iron levels -high levels of iron in the blood -magnetic resonance imaging (MRI) test scheduled -an unusual or allergic reaction to iron, other medicines, foods, dyes, or preservatives -pregnant or trying to get pregnant -breast-feeding How should I use this medicine? This medicine is for infusion into a vein. It is given by a health care professional in a hospital or clinic setting. Talk to your pediatrician regarding the use of this medicine in children. Special care may be needed. Overdosage: If you think you've taken too much of this medicine contact a poison control center or emergency room at once. Overdosage: If you think you have taken too much of this medicine contact a poison control center or emergency room at once. NOTE: This medicine is only for you. Do not share this medicine with others. What if I miss a dose? It is important not to miss your dose. Call your doctor or health care professional if you are unable to keep an appointment. What may interact with this medicine? This medicine may interact with the following medications: -other iron products This list may not describe all possible interactions. Give your health care provider a list of all the medicines, herbs, non-prescription drugs, or dietary supplements you use. Also tell them if you smoke, drink alcohol, or use illegal drugs. Some items may interact with your medicine. What should I watch  for while using this medicine? Visit your doctor or healthcare professional regularly. Tell your doctor or healthcare professional if your symptoms do not start to get better or if they get worse. You may need blood work done while you are taking this medicine. You may need to follow a special diet. Talk to your doctor. Foods that contain iron include: whole grains/cereals, dried fruits, beans, or peas, leafy green vegetables, and organ meats (liver, kidney). What side effects may I notice from receiving this medicine? Side effects that you should report to your doctor or health care professional as soon as possible: -allergic reactions like skin rash, itching or hives, swelling of the face, lips, or tongue -breathing problems -changes in blood pressure -feeling faint or lightheaded, falls -fever or chills -flushing, sweating, or hot feelings -swelling of the ankles or feet Side effects that usually do not require medical attention (Report these to your doctor or health care professional if they continue or are bothersome.): -diarrhea -headache -nausea, vomiting -stomach pain This list may not describe all possible side effects. Call your doctor for medical advice about side effects. You may report side effects to FDA at 1-800-FDA-1088. Where should I keep my medicine? This drug is given in a hospital or clinic and will not be stored at home. NOTE: This sheet is a summary. It may not cover all possible information. If you have questions about this medicine, talk to your doctor, pharmacist, or health care provider.  2013, [employer]/Gold Standard. (08/17/2008 9:48:25 PM)  

## 2013-01-11 ENCOUNTER — Telehealth: Payer: Self-pay | Admitting: General Practice

## 2013-01-11 NOTE — Telephone Encounter (Signed)
We will need to see her back in a visit to re-assess.

## 2013-01-11 NOTE — Telephone Encounter (Signed)
Pt called stating that the cyst that she showed you at her last appt has swollen up and hard to the touch. States that she cannot get into Dr. Dionne Bucy until the 20th. Please advise.

## 2013-01-11 NOTE — Telephone Encounter (Signed)
Patient notified as instructed by telephone. 

## 2013-01-13 ENCOUNTER — Ambulatory Visit (INDEPENDENT_AMBULATORY_CARE_PROVIDER_SITE_OTHER): Payer: Medicare Other | Admitting: Internal Medicine

## 2013-01-13 ENCOUNTER — Encounter: Payer: Self-pay | Admitting: Internal Medicine

## 2013-01-13 VITALS — BP 130/74 | HR 62 | Temp 98.1°F | Ht 64.0 in | Wt 200.5 lb

## 2013-01-13 DIAGNOSIS — L723 Sebaceous cyst: Secondary | ICD-10-CM | POA: Diagnosis not present

## 2013-01-13 DIAGNOSIS — L03319 Cellulitis of trunk, unspecified: Secondary | ICD-10-CM | POA: Diagnosis not present

## 2013-01-13 DIAGNOSIS — L03312 Cellulitis of back [any part except buttock]: Secondary | ICD-10-CM | POA: Insufficient documentation

## 2013-01-13 DIAGNOSIS — L02219 Cutaneous abscess of trunk, unspecified: Secondary | ICD-10-CM

## 2013-01-13 MED ORDER — DOXYCYCLINE HYCLATE 100 MG PO TABS: 100.0000 mg | ORAL_TABLET | Freq: Two times a day (BID) | ORAL | Status: DC

## 2013-01-13 NOTE — Progress Notes (Signed)
Subjective:    Patient ID: Cassidy Ortiz, female    DOB: 11-29-1941, 72 y.o.   MRN: [id number]  HPI 72 year old female presents for acute visit complaining of a cystic area in her mid upper back. Over the last week, the area has become red and painful. It is warm to touch. Patient denies any fever or chills. Denies any other symptoms. Not taking any medication for this.  Outpatient Encounter Prescriptions as of 01/13/2013  Medication Sig Dispense Refill  . ALPRAZolam (XANAX) 0.25 MG tablet Take 0.25 mg by mouth as needed.       Marland Kitchen aspirin 81 MG tablet Take 81 mg by mouth daily. Takes 2 tablets daily      . atorvastatin (LIPITOR) 40 MG tablet Take 1 tablet (40 mg total) by mouth daily.  90 tablet  3  . Calcium 150 MG TABS 1,200 mg. Take 1,200 mg by mouth daily.      . Calcium Carbonate-Vit D-Min (CALCIUM 1200 PO) Take 1 tablet by mouth daily.      . carvedilol (COREG) 6.25 MG tablet Take 0.5 tablets (3.125 mg total) by mouth 2 (two) times daily.  90 tablet  4  . cloNIDine (CATAPRES) 0.1 MG tablet Take 0.1 mg by mouth 3 (three) times daily.      Marland Kitchen esomeprazole (NEXIUM) 40 MG capsule Take 1 capsule (40 mg total) by mouth daily.  30 capsule  11  . furosemide (LASIX) 20 MG tablet Take 20 mg by mouth as needed.       . hydrALAZINE (APRESOLINE) 100 MG tablet Take 1 tablet (100 mg total) by mouth 3 (three) times daily.  270 tablet  3  . levothyroxine (SYNTHROID, LEVOTHROID) 150 MCG tablet Take 150 mcg by mouth daily.      . Multiple Vitamin (MULTIVITAMIN) tablet Take 1 tablet by mouth daily.        Marland Kitchen rOPINIRole (REQUIP) 1 MG tablet Take 1 mg by mouth daily.      . traZODone (DESYREL) 150 MG tablet Take 150 mg by mouth at bedtime.        . Vilazodone HCl (VIIBRYD) 40 MG TABS Take 1 tablet by mouth daily.        Marland Kitchen VITAMIN D, CHOLECALCIFEROL, PO Take 2,000 mg by mouth daily.      Marland Kitchen zolpidem (AMBIEN) 10 MG tablet Take 10 mg by mouth at bedtime as needed.        . doxycycline (VIBRA-TABS) 100 MG tablet Take  1 tablet (100 mg total) by mouth 2 (two) times daily.  20 tablet  0  . [DISCONTINUED] Cholecalciferol (VITAMIN D PO) 2,000 mg. Take 2,000 mg by mouth daily.       BP 130/74  Pulse 62  Temp 98.1 F (36.7 C) (Oral)  Ht 5\' 4"  (1.626 m)  Wt 200 lb 8 oz (90.946 kg)  BMI 34.42 kg/m2  SpO2 92%  Review of Systems  Constitutional: Negative for fever and chills.  Skin: Positive for color change and wound.       Objective:   Physical Exam  Constitutional: She is oriented to person, place, and time. She appears well-developed and well-nourished. No distress.  HENT:  Head: Normocephalic.  Eyes: Pupils are equal, round, and reactive to light.  Neck: Normal range of motion.  Pulmonary/Chest: Effort normal.  Neurological: She is alert and oriented to person, place, and time. She has normal reflexes.  Skin: Skin is warm. Lesion noted. She is not diaphoretic. There is erythema.  Psychiatric: She has a normal mood and affect. Her behavior is normal. Judgment and thought content normal.          Assessment & Plan:

## 2013-01-13 NOTE — Assessment & Plan Note (Signed)
Symptoms and exam consistent with cellulitis and abscess of upper back. Will start Doxycycline. Will set up more urgent referral to dermatology for incision and drainage, as will likely require more complicated resection.

## 2013-01-14 ENCOUNTER — Telehealth: Payer: Self-pay | Admitting: Internal Medicine

## 2013-01-14 DIAGNOSIS — L723 Sebaceous cyst: Secondary | ICD-10-CM | POA: Diagnosis not present

## 2013-01-14 NOTE — Telephone Encounter (Signed)
Pt was seen by surgeon today. She wanted to let you know. Pt would like a nurse to call back so she can give Dr. Gilford Rile some insite on what was said and discussed at the appt today. She asks if Dr. Gilford Rile herself could call back I said I would ask ??

## 2013-01-14 NOTE — Telephone Encounter (Signed)
Returned pt call. Pt abscess was drained by surgeon today. Packed with gauze. She continues on Doxycycline. Will follow up with surgeon next week.

## 2013-01-15 ENCOUNTER — Ambulatory Visit (INDEPENDENT_AMBULATORY_CARE_PROVIDER_SITE_OTHER): Payer: Medicare Other | Admitting: Internal Medicine

## 2013-01-15 DIAGNOSIS — L02219 Cutaneous abscess of trunk, unspecified: Secondary | ICD-10-CM

## 2013-01-15 DIAGNOSIS — L03312 Cellulitis of back [any part except buttock]: Secondary | ICD-10-CM

## 2013-01-15 DIAGNOSIS — L03319 Cellulitis of trunk, unspecified: Secondary | ICD-10-CM | POA: Diagnosis not present

## 2013-01-15 NOTE — Progress Notes (Signed)
  Subjective:    Patient ID: Cassidy Ortiz, female    DOB: 1941-11-09, 72 y.o.   MRN: [id number]  HPI Nurse only   Review of Systems     Objective:   Physical Exam        Assessment & Plan:

## 2013-01-15 NOTE — Progress Notes (Signed)
  Subjective:    Patient ID: Cassidy Ortiz, female    DOB: Jan 26, 1941, 72 y.o.   MRN: [id number]  HPI    Review of Systems     Objective:   Physical Exam        Assessment & Plan:  Patient came in as nurse visit to have wound redressed.

## 2013-01-20 ENCOUNTER — Ambulatory Visit: Payer: Medicare Other | Admitting: Internal Medicine

## 2013-01-25 ENCOUNTER — Other Ambulatory Visit: Payer: Self-pay | Admitting: *Deleted

## 2013-01-25 DIAGNOSIS — L723 Sebaceous cyst: Secondary | ICD-10-CM | POA: Diagnosis not present

## 2013-01-25 MED ORDER — ALPRAZOLAM 0.25 MG PO TABS: 0.2500 mg | ORAL_TABLET | Freq: Three times a day (TID) | ORAL | Status: DC | PRN

## 2013-01-26 MED ORDER — ZOLPIDEM TARTRATE 5 MG PO TABS: 5.0000 mg | ORAL_TABLET | Freq: Every evening | ORAL | Status: DC | PRN

## 2013-01-26 NOTE — Telephone Encounter (Signed)
Rx called to pharmacy

## 2013-01-26 NOTE — Telephone Encounter (Signed)
Note dosing change per FDA guidelines.

## 2013-01-28 DIAGNOSIS — I679 Cerebrovascular disease, unspecified: Secondary | ICD-10-CM | POA: Diagnosis not present

## 2013-01-28 DIAGNOSIS — D1800 Hemangioma unspecified site: Secondary | ICD-10-CM | POA: Diagnosis not present

## 2013-01-28 DIAGNOSIS — L03319 Cellulitis of trunk, unspecified: Secondary | ICD-10-CM | POA: Diagnosis not present

## 2013-01-28 DIAGNOSIS — L659 Nonscarring hair loss, unspecified: Secondary | ICD-10-CM | POA: Diagnosis not present

## 2013-01-28 DIAGNOSIS — L219 Seborrheic dermatitis, unspecified: Secondary | ICD-10-CM | POA: Diagnosis not present

## 2013-01-28 DIAGNOSIS — L820 Inflamed seborrheic keratosis: Secondary | ICD-10-CM | POA: Diagnosis not present

## 2013-02-01 DIAGNOSIS — L723 Sebaceous cyst: Secondary | ICD-10-CM | POA: Diagnosis not present

## 2013-02-15 ENCOUNTER — Encounter: Payer: Self-pay | Admitting: Cardiovascular Disease

## 2013-02-15 ENCOUNTER — Ambulatory Visit (INDEPENDENT_AMBULATORY_CARE_PROVIDER_SITE_OTHER): Payer: Medicare Other | Admitting: Cardiovascular Disease

## 2013-02-15 VITALS — BP 130/72 | HR 61 | Ht 67.0 in | Wt 198.5 lb

## 2013-02-15 DIAGNOSIS — I498 Other specified cardiac arrhythmias: Secondary | ICD-10-CM

## 2013-02-15 DIAGNOSIS — R001 Bradycardia, unspecified: Secondary | ICD-10-CM

## 2013-02-15 DIAGNOSIS — Q248 Other specified congenital malformations of heart: Secondary | ICD-10-CM

## 2013-02-15 DIAGNOSIS — I509 Heart failure, unspecified: Secondary | ICD-10-CM | POA: Diagnosis not present

## 2013-02-15 DIAGNOSIS — I720 Aneurysm of carotid artery: Secondary | ICD-10-CM | POA: Diagnosis not present

## 2013-02-15 DIAGNOSIS — I10 Essential (primary) hypertension: Secondary | ICD-10-CM | POA: Diagnosis not present

## 2013-02-15 DIAGNOSIS — I5031 Acute diastolic (congestive) heart failure: Secondary | ICD-10-CM

## 2013-02-15 MED ORDER — OMEPRAZOLE 20 MG PO CPDR: 20.0000 mg | DELAYED_RELEASE_CAPSULE | Freq: Two times a day (BID) | ORAL | Status: DC

## 2013-02-15 NOTE — Assessment & Plan Note (Signed)
Murmur on exam consistent with LVH outflow tract gradient. She is relatively asymptomatic. No dizziness. We have recommended she stay hydrated.

## 2013-02-15 NOTE — Progress Notes (Signed)
Patient ID: Cassidy Ortiz, female    DOB: 11-27-1941, 72 y.o.   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 She initially presented with lower extremity edema.  Previous  elevated creatinine of 1.5. Edema improved by holding diltiazem  Echocardiogram showed severe LVH, mild outflow tract gradient. No mention of elevated right ventricular systolic pressures. Previous  Problems with anemia before requiring IM iron, now resolved  Previous EGD showed  ulcer, H. Pylori negative.  Earlier in 2013, we  decreased her diltiazem and finally discontinued the diltiazem. Her edema has resolved. She has decreased her Lasix to 20 mg once a day. On her last clinic visit, clonidine was increased to 0.1 mg 3 times a day with hydralazine 100 mg 3 times a day.   Since her last clinic visit, she has lost more than 30 pounds and feels very well. She has no complaints. No edema, no chest pain, no shortness of breath. She does have some hair loss and is concerned about her statin or hydralazine as these were the 2 medications mentioned by an outside physician, possibly dermatology.  previous assessment/evaluation at [facility] for history of aneurysms and shunt (leak?) showed that shunt is not working but it is not a problem. No recent carotid ultrasound  EKG shows normal sinus rhythm with rate 61 beats per minute with no significant ST or T wave changes  Outpatient Encounter Prescriptions as of 02/15/2013  Medication Sig Dispense Refill  .  ALPRAZolam (XANAX) 0.25 MG tablet Take 1 tablet (0.25 mg total) by mouth 3 (three) times daily as needed for anxiety.  60 tablet  4  . aspirin 81 MG tablet Take 81 mg by mouth daily. Takes 2 tablets daily      . atorvastatin (LIPITOR) 40 MG tablet Take 1 tablet (40 mg total) by mouth daily.  90 tablet  3  . Calcium 150 MG TABS 1,200 mg. Take 1,200 mg by mouth daily.      . Calcium Carbonate-Vit D-Min (CALCIUM 1200 PO) Take 1 tablet by mouth daily.      . carvedilol (COREG) 6.25 MG tablet Take 0.5 tablets (3.125 mg total) by mouth 2 (two) times daily.  90 tablet  4  . cloNIDine (CATAPRES) 0.1 MG tablet Take 0.1 mg by mouth 3 (three) times daily.      Marland Kitchen esomeprazole (NEXIUM) 40 MG capsule Take 1 capsule (40 mg total) by mouth daily.  30 capsule  11  . furosemide (LASIX) 20 MG tablet Take 20 mg by mouth as needed.       . hydrALAZINE (APRESOLINE) 100 MG tablet Take 1 tablet (100 mg total) by mouth 3 (three) times daily.  270 tablet  3  . levothyroxine (SYNTHROID, LEVOTHROID) 150 MCG tablet Take 150 mcg by mouth daily.      . Multiple Vitamin (MULTIVITAMIN) tablet Take 1 tablet by mouth daily.        Marland Kitchen rOPINIRole (REQUIP) 1 MG tablet Take 1 mg by mouth daily as needed.       . traZODone (DESYREL)  150 MG tablet Take 150 mg by mouth at bedtime.        . Vilazodone HCl (VIIBRYD) 40 MG TABS Take 1 tablet by mouth daily.        Marland Kitchen VITAMIN D, CHOLECALCIFEROL, PO Take 2,000 mg by mouth daily.      Marland Kitchen zolpidem (AMBIEN) 5 MG tablet Take 1 tablet (5 mg total) by mouth at bedtime as needed.  30 tablet  4  . omeprazole (PRILOSEC) 20 MG capsule Take 1 capsule (20 mg total) by mouth 2 (two) times daily.  180 capsule  3  . [DISCONTINUED] doxycycline (VIBRA-TABS) 100 MG tablet Take 1 tablet (100 mg total) by mouth 2 (two) times daily.  20 tablet  0   No facility-administered encounter medications on file as of 02/15/2013.    Review of Systems  Constitutional: Positive for unexpected weight change.  HENT: Negative.    Eyes: Negative.   Respiratory: Negative.   Cardiovascular:          Gastrointestinal: Negative.   Musculoskeletal: Negative.   Skin: Negative.   Neurological: Negative.   Psychiatric/Behavioral: Negative.   All other systems reviewed and are negative.   BP 130/72  Pulse 61  Ht 5\' 7"  (1.702 m)  Wt 198 lb 8 oz (90.039 kg)  BMI 31.08 kg/m2  Physical Exam  Nursing note and vitals reviewed. Constitutional: She is oriented to person, place, and time. She appears well-developed and well-nourished.  Obese  HENT:  Head: Normocephalic.  Nose: Nose normal.  Mouth/Throat: Oropharynx is clear and moist.  Eyes: Conjunctivae are normal. Pupils are equal, round, and reactive to light.  Neck: Normal range of motion. Neck supple. No JVD present.  Cardiovascular: Normal rate, regular rhythm, S1 normal, S2 normal and intact distal pulses.  Exam reveals no gallop and no friction rub.   Murmur heard.  Crescendo systolic murmur is present with a grade of 2/6  Pulmonary/Chest: Effort normal and breath sounds normal. No respiratory distress. She has no wheezes. She has no rales. She exhibits no tenderness.  Abdominal: Soft. Bowel sounds are normal. She exhibits no distension. There is no tenderness.  Musculoskeletal: Normal range of motion. She exhibits no edema and no tenderness.  Lymphadenopathy:    She has no cervical adenopathy.  Neurological: She is alert and oriented to person, place, and time. Coordination normal.  Skin: Skin is warm and dry. No rash noted. No erythema.  Psychiatric: She has a normal mood and affect. Her behavior is normal. Judgment and thought content normal.    Assessment and Plan

## 2013-02-15 NOTE — Patient Instructions (Addendum)
You are doing well. Please monitor your blood pressure at home Call the office if your numbers are low  Hold your nexium and start omeprazole once a day  We will order a carotid ultrasound for history of aneurysm of the carotid  Ok to hold statin after blood work with Dr. Gilford Rile if you think this is causing hair loss.  Please call us if you have new issues that need to be addressed before your next appt.  Your physician wants you to follow-up in: 6 months.  You will receive a reminder letter in the mail two months in advance. If you don't receive a letter, please call our office to schedule the follow-up appointment.

## 2013-02-15 NOTE — Assessment & Plan Note (Signed)
Doing well, no edema, no shortness of breath. No changes to her medications

## 2013-02-15 NOTE — Assessment & Plan Note (Signed)
We have suggested that she closely monitor her blood pressure. With recent weight loss, it is certainly possible that blood pressure will drop and we could wean back on her hydralazine, especially given here loss and her fear that this medication is contributing to her symptoms. We've asked her to call us with blood pressure numbers.

## 2013-02-15 NOTE — Assessment & Plan Note (Signed)
Weight down in the past 6 months. She has been watching her diet closely.

## 2013-02-16 DIAGNOSIS — L723 Sebaceous cyst: Secondary | ICD-10-CM | POA: Diagnosis not present

## 2013-03-01 ENCOUNTER — Telehealth: Payer: Self-pay | Admitting: *Deleted

## 2013-03-01 MED ORDER — LEVOTHYROXINE SODIUM 150 MCG PO TABS: 150.0000 ug | ORAL_TABLET | Freq: Every day | ORAL | Status: DC

## 2013-03-01 NOTE — Telephone Encounter (Signed)
Patients husband called stating she needed a refill on her synthroid,. [place]

## 2013-03-01 NOTE — Telephone Encounter (Signed)
Rx sent to [facility].

## 2013-03-24 ENCOUNTER — Ambulatory Visit (INDEPENDENT_AMBULATORY_CARE_PROVIDER_SITE_OTHER): Payer: Medicare Other | Admitting: Internal Medicine

## 2013-03-24 ENCOUNTER — Encounter: Payer: Self-pay | Admitting: Internal Medicine

## 2013-03-24 VITALS — BP 166/110 | HR 56 | Temp 98.3°F | Wt 192.0 lb

## 2013-03-24 DIAGNOSIS — E079 Disorder of thyroid, unspecified: Secondary | ICD-10-CM | POA: Diagnosis not present

## 2013-03-24 DIAGNOSIS — I10 Essential (primary) hypertension: Secondary | ICD-10-CM

## 2013-03-24 DIAGNOSIS — D649 Anemia, unspecified: Secondary | ICD-10-CM | POA: Diagnosis not present

## 2013-03-24 LAB — COMPREHENSIVE METABOLIC PANEL
Albumin: 3.9 g/dL (ref 3.5–5.2)
Alkaline Phosphatase: 68 U/L (ref 39–117)
BUN: 33 mg/dL — ABNORMAL HIGH (ref 6–23)
Glucose, Bld: 106 mg/dL — ABNORMAL HIGH (ref 70–99)
Potassium: 4.5 mEq/L (ref 3.5–5.1)
Total Bilirubin: 0.7 mg/dL (ref 0.3–1.2)

## 2013-03-24 LAB — CBC WITH DIFFERENTIAL/PLATELET
Basophils Absolute: 0 10*3/uL (ref 0.0–0.1)
Basophils Relative: 0.6 % (ref 0.0–3.0)
Eosinophils Absolute: 0.2 10*3/uL (ref 0.0–0.7)
HCT: 40.7 % (ref 36.0–46.0)
Lymphocytes Relative: 22.9 % (ref 12.0–46.0)
Monocytes Absolute: 0.3 10*3/uL (ref 0.1–1.0)
Neutro Abs: 4.9 10*3/uL (ref 1.4–7.7)
Neutrophils Relative %: 69.4 % (ref 43.0–77.0)
Platelets: 162 10*3/uL (ref 150.0–400.0)
WBC: 7.1 10*3/uL (ref 4.5–10.5)

## 2013-03-24 LAB — TSH: TSH: 0.89 u[IU]/mL (ref 0.35–5.50)

## 2013-03-24 NOTE — Assessment & Plan Note (Signed)
Wt Readings from Last 3 Encounters:  03/24/13 192 lb (87.091 kg)  02/15/13 198 lb 8 oz (90.039 kg)  01/13/13 200 lb 8 oz (90.946 kg)   Congratulated patient on overall nearly 50 pound weight loss. Encouraged her to continue efforts at healthy diet and regular physical activity.

## 2013-03-24 NOTE — Assessment & Plan Note (Signed)
Symptomatically doing well. Will check TSH with labs today.

## 2013-03-24 NOTE — Progress Notes (Signed)
Subjective:    Patient ID: Cassidy Ortiz, female    DOB: 1941/01/19, 72 y.o.   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 She reports she is doing well. Feeling "the best that she has felt in a long time "with good energy level and no current concerns. She is compliant with medication. She has been following a healthy diet an effort to lose weight and has lost a total of nearly 50 pounds. She is trying to get exercise by walking. She notes that her cardiologist recently changed her acid reflux medication from Nexium to omeprazole. She reports some increased symptoms of reflux with this change in medication. However, she denies food intolerance, nausea, vomiting, change in bowel habits.  Previous abscess in her right upper back has completely healed. She denies any recurrent pain at the site. She denies any other recurrent abscesses.  Outpatient Encounter Prescriptions as of 03/24/2013  Medication Sig Dispense Refill  . ALPRAZolam (XANAX) 0.25 MG tablet Take 1 tablet (0.25 mg total) by mouth 3 (three) times daily as needed for anxiety.  60 tablet  4  . aspirin 81 MG tablet Take 81 mg by mouth daily. Takes 2 tablets daily      . atorvastatin (LIPITOR) 40 MG tablet Take 1 tablet (40 mg total) by mouth daily.  90 tablet  3  . Calcium 150 MG TABS 1,200 mg. Take 1,200 mg by mouth daily.      . Calcium Carbonate-Vit D-Min (CALCIUM 1200 PO) Take 1 tablet by mouth daily.      . carvedilol (COREG) 6.25 MG tablet Take 0.5 tablets (3.125 mg total) by mouth 2 (two) times daily.  90 tablet  4  . cloNIDine (CATAPRES) 0.1 MG tablet Take 0.1 mg by mouth 3 (three) times daily.      . furosemide (LASIX) 20 MG tablet Take 20 mg by mouth as needed.       . hydrALAZINE (APRESOLINE) 100 MG tablet Take 1 tablet (100 mg total) by mouth 3 (three) times daily.  270 tablet   3  . levothyroxine (SYNTHROID, LEVOTHROID) 150 MCG tablet Take 1 tablet (150 mcg total) by mouth daily.  90 tablet  1  . Multiple Vitamin (MULTIVITAMIN) tablet Take 1 tablet by mouth daily.        Marland Kitchen omeprazole (PRILOSEC) 20 MG capsule Take 1 capsule (20 mg total) by mouth 2 (two) times daily.  180 capsule  3  . rOPINIRole (REQUIP) 1 MG tablet Take 1 mg by mouth daily as needed.       . traZODone (DESYREL) 150 MG tablet Take 150 mg by mouth at bedtime.        . Vilazodone HCl (VIIBRYD) 40 MG TABS Take 1 tablet by mouth daily.        Marland Kitchen VITAMIN D, CHOLECALCIFEROL, PO Take 2,000 mg by mouth daily.      Marland Kitchen zolpidem (AMBIEN) 5 MG tablet Take 1 tablet (5 mg total) by mouth at bedtime as needed.  30 tablet  4  . [DISCONTINUED] esomeprazole (NEXIUM) 40 MG capsule Take 1 capsule (40 mg total) by mouth daily.  30 capsule  11   No facility-administered encounter medications on file as of 03/24/2013.    Review of Systems  Constitutional: Negative for fever, chills, appetite change, fatigue and unexpected weight change.  HENT: Negative for ear pain, congestion, sore throat, trouble swallowing, neck pain,  voice change and sinus pressure.   Eyes: Negative for visual disturbance.  Respiratory: Negative for cough, shortness of breath, wheezing and stridor.   Cardiovascular: Negative for chest pain, palpitations and leg swelling.  Gastrointestinal: Negative for nausea, vomiting, abdominal pain, diarrhea, constipation, blood in stool, abdominal distention and anal bleeding.  Genitourinary: Negative for dysuria and flank pain.  Musculoskeletal: Negative for myalgias, arthralgias and gait problem.  Skin: Negative for color change and rash.  Neurological: Negative for dizziness and headaches.  Hematological: Negative for adenopathy. Does not bruise/bleed easily.  Psychiatric/Behavioral: Negative for suicidal ideas, sleep disturbance and dysphoric mood. The patient is not nervous/anxious.        Objective:    Physical Exam  Constitutional: She is oriented to person, place, and time. She appears well-developed and well-nourished. No distress.  HENT:  Head: Normocephalic and atraumatic.  Right Ear: External ear normal.  Left Ear: External ear normal.  Nose: Nose normal.  Mouth/Throat: Oropharynx is clear and moist. No oropharyngeal exudate.  Eyes: Conjunctivae are normal. Pupils are equal, round, and reactive to light. Right eye exhibits no discharge. Left eye exhibits no discharge. No scleral icterus.  Neck: Normal range of motion. Neck supple. No tracheal deviation present. No thyromegaly present.  Cardiovascular: Normal rate, regular rhythm, normal heart sounds and intact distal pulses.  Exam reveals no gallop and no friction rub.   No murmur heard. Pulmonary/Chest: Effort normal and breath sounds normal. No respiratory distress. She has no wheezes. She has no rales. She exhibits no tenderness.  Abdominal: Soft. Bowel sounds are normal. She exhibits no distension. There is no tenderness. There is no rebound.  Musculoskeletal: Normal range of motion. She exhibits no edema and no tenderness.  Lymphadenopathy:    She has no cervical adenopathy.  Neurological: She is alert and oriented to person, place, and time. No cranial nerve deficit. She exhibits normal muscle tone. Coordination normal.  Skin: Skin is warm and dry. No rash noted. She is not diaphoretic. No erythema. No pallor.  Psychiatric: She has a normal mood and affect. Her behavior is normal. Judgment and thought content normal.          Assessment & Plan:

## 2013-03-24 NOTE — Assessment & Plan Note (Signed)
BP Readings from Last 3 Encounters:  03/24/13 166/110  02/15/13 130/72  01/13/13 130/74   Blood pressure slightly elevated today however has been well-controlled at home. We'll continue to monitor for now. Patient will call if blood pressure at home consistently greater than 150/90.

## 2013-03-24 NOTE — Assessment & Plan Note (Signed)
Iron deficiency anemia noted on previous labs. Will check CBC and ferritin with labs today.

## 2013-03-30 DIAGNOSIS — I679 Cerebrovascular disease, unspecified: Secondary | ICD-10-CM | POA: Diagnosis not present

## 2013-03-30 DIAGNOSIS — L299 Pruritus, unspecified: Secondary | ICD-10-CM | POA: Diagnosis not present

## 2013-03-30 DIAGNOSIS — L820 Inflamed seborrheic keratosis: Secondary | ICD-10-CM | POA: Diagnosis not present

## 2013-03-30 DIAGNOSIS — L219 Seborrheic dermatitis, unspecified: Secondary | ICD-10-CM | POA: Diagnosis not present

## 2013-04-09 ENCOUNTER — Other Ambulatory Visit: Payer: Self-pay | Admitting: *Deleted

## 2013-04-09 MED ORDER — CLONIDINE HCL 0.1 MG PO TABS: 0.1000 mg | ORAL_TABLET | Freq: Three times a day (TID) | ORAL | Status: DC

## 2013-04-09 NOTE — Telephone Encounter (Signed)
Refilled Clonidine sent to [facility].

## 2013-04-21 ENCOUNTER — Ambulatory Visit (HOSPITAL_BASED_OUTPATIENT_CLINIC_OR_DEPARTMENT_OTHER): Payer: Medicare Other | Admitting: Hematology & Oncology

## 2013-04-21 ENCOUNTER — Other Ambulatory Visit (HOSPITAL_BASED_OUTPATIENT_CLINIC_OR_DEPARTMENT_OTHER): Payer: Medicare Other | Admitting: Lab

## 2013-04-21 VITALS — BP 137/70 | HR 59 | Temp 98.0°F | Resp 59 | Ht 67.0 in | Wt 188.0 lb

## 2013-04-21 DIAGNOSIS — R011 Cardiac murmur, unspecified: Secondary | ICD-10-CM | POA: Diagnosis not present

## 2013-04-21 DIAGNOSIS — R0602 Shortness of breath: Secondary | ICD-10-CM | POA: Diagnosis not present

## 2013-04-21 DIAGNOSIS — C649 Malignant neoplasm of unspecified kidney, except renal pelvis: Secondary | ICD-10-CM | POA: Diagnosis not present

## 2013-04-21 DIAGNOSIS — F329 Major depressive disorder, single episode, unspecified: Secondary | ICD-10-CM | POA: Diagnosis not present

## 2013-04-21 DIAGNOSIS — K921 Melena: Secondary | ICD-10-CM | POA: Diagnosis not present

## 2013-04-21 DIAGNOSIS — E079 Disorder of thyroid, unspecified: Secondary | ICD-10-CM | POA: Diagnosis not present

## 2013-04-21 DIAGNOSIS — E559 Vitamin D deficiency, unspecified: Secondary | ICD-10-CM | POA: Diagnosis not present

## 2013-04-21 DIAGNOSIS — I421 Obstructive hypertrophic cardiomyopathy: Secondary | ICD-10-CM | POA: Diagnosis not present

## 2013-04-21 DIAGNOSIS — R5381 Other malaise: Secondary | ICD-10-CM | POA: Diagnosis not present

## 2013-04-21 DIAGNOSIS — M799 Soft tissue disorder, unspecified: Secondary | ICD-10-CM | POA: Diagnosis not present

## 2013-04-21 DIAGNOSIS — D509 Iron deficiency anemia, unspecified: Secondary | ICD-10-CM

## 2013-04-21 DIAGNOSIS — E0500 Thyrotoxicosis with diffuse goiter without thyrotoxic crisis or storm: Secondary | ICD-10-CM

## 2013-04-21 DIAGNOSIS — I5031 Acute diastolic (congestive) heart failure: Secondary | ICD-10-CM | POA: Diagnosis not present

## 2013-04-21 DIAGNOSIS — K219 Gastro-esophageal reflux disease without esophagitis: Secondary | ICD-10-CM | POA: Diagnosis not present

## 2013-04-21 DIAGNOSIS — Q248 Other specified congenital malformations of heart: Secondary | ICD-10-CM | POA: Diagnosis not present

## 2013-04-21 DIAGNOSIS — I509 Heart failure, unspecified: Secondary | ICD-10-CM | POA: Diagnosis not present

## 2013-04-21 DIAGNOSIS — Z982 Presence of cerebrospinal fluid drainage device: Secondary | ICD-10-CM | POA: Diagnosis not present

## 2013-04-21 DIAGNOSIS — C50911 Malignant neoplasm of unspecified site of right female breast: Secondary | ICD-10-CM

## 2013-04-21 DIAGNOSIS — K2970 Gastritis, unspecified, without bleeding: Secondary | ICD-10-CM | POA: Diagnosis not present

## 2013-04-21 DIAGNOSIS — I10 Essential (primary) hypertension: Secondary | ICD-10-CM | POA: Diagnosis not present

## 2013-04-21 DIAGNOSIS — R5383 Other fatigue: Secondary | ICD-10-CM | POA: Diagnosis not present

## 2013-04-21 DIAGNOSIS — E039 Hypothyroidism, unspecified: Secondary | ICD-10-CM | POA: Diagnosis not present

## 2013-04-21 DIAGNOSIS — Z853 Personal history of malignant neoplasm of breast: Secondary | ICD-10-CM | POA: Diagnosis not present

## 2013-04-21 DIAGNOSIS — C50919 Malignant neoplasm of unspecified site of unspecified female breast: Secondary | ICD-10-CM | POA: Diagnosis not present

## 2013-04-21 DIAGNOSIS — D649 Anemia, unspecified: Secondary | ICD-10-CM | POA: Diagnosis not present

## 2013-04-21 DIAGNOSIS — Z85528 Personal history of other malignant neoplasm of kidney: Secondary | ICD-10-CM | POA: Diagnosis not present

## 2013-04-21 DIAGNOSIS — R0609 Other forms of dyspnea: Secondary | ICD-10-CM | POA: Diagnosis not present

## 2013-04-21 DIAGNOSIS — I671 Cerebral aneurysm, nonruptured: Secondary | ICD-10-CM | POA: Diagnosis not present

## 2013-04-21 DIAGNOSIS — R609 Edema, unspecified: Secondary | ICD-10-CM | POA: Diagnosis not present

## 2013-04-21 LAB — CBC WITH DIFFERENTIAL (CANCER CENTER ONLY)
BASO#: 0 10*3/uL (ref 0.0–0.2)
BASO%: 0.3 % (ref 0.0–2.0)
EOS%: 2.5 % (ref 0.0–7.0)
Eosinophils Absolute: 0.2 10*3/uL (ref 0.0–0.5)
HCT: 43 % (ref 34.8–46.6)
HGB: 14.5 g/dL (ref 11.6–15.9)
LYMPH#: 1.2 10*3/uL (ref 0.9–3.3)
LYMPH%: 18.5 % (ref 14.0–48.0)
MCH: 31 pg (ref 26.0–34.0)
MCHC: 33.7 g/dL (ref 32.0–36.0)
MCV: 92 fL (ref 81–101)
MONO#: 0.4 10*3/uL (ref 0.1–0.9)
MONO%: 6.5 % (ref 0.0–13.0)
NEUT#: 4.6 10*3/uL (ref 1.5–6.5)
NEUT%: 72.2 % (ref 39.6–80.0)
Platelets: 156 10*3/uL (ref 145–400)
RBC: 4.68 10*6/uL (ref 3.70–5.32)
RDW: 13.8 % (ref 11.1–15.7)
WBC: 6.3 10*3/uL (ref 3.9–10.0)

## 2013-04-21 LAB — TSH: TSH: 1.335 u[IU]/mL (ref 0.350–4.500)

## 2013-04-21 LAB — IRON AND TIBC
Iron: 85 ug/dL (ref 42–145)
TIBC: 251 ug/dL (ref 250–470)

## 2013-04-21 LAB — COMPREHENSIVE METABOLIC PANEL
AST: 17 U/L (ref 0–37)
BUN: 32 mg/dL — ABNORMAL HIGH (ref 6–23)
Calcium: 9.9 mg/dL (ref 8.4–10.5)
Chloride: 106 mEq/L (ref 96–112)
Creatinine, Ser: 1.27 mg/dL — ABNORMAL HIGH (ref 0.50–1.10)
Total Bilirubin: 0.4 mg/dL (ref 0.3–1.2)

## 2013-04-21 LAB — FERRITIN: Ferritin: 110 ng/mL (ref 10–291)

## 2013-04-21 NOTE — Progress Notes (Signed)
This office note has been dictated.

## 2013-04-22 NOTE — Progress Notes (Signed)
CC:   Ronette Deter, MD  DIAGNOSES: 1. 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 2. 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 3. Cerebral aneurysms. 4. Intermittent iron-deficiency anemia.  CURRENT THERAPY:  IV iron as indicated.  INTERIM HISTORY:  [name] comes in for followup.  She is doing okay. She complains of no specific complaints since we last saw her.  She still feels tired.  Personally, I think some it is from all of her medications.  She is literally on 15 different medications.  She does see a cardiologist.  She is on 4 different blood pressure medications. I told her that she could probably stop the Lasix as I do not see any evidence of fluid retention with her.  She is on clonidine 3 times a day.  I told her maybe to cut back to twice a day.  She has had IV iron given to her.  Her last TSH was 0.89 back a month ago.  Her last iron that she received was back a year ago in April.  PHYSICAL EXAMINATION:  General:  This is a well-developed, well- nourished white female, in no obvious distress.  Vital signs: Temperature of 98, pulse 59, respiratory rate 16, blood pressure 137/70. Weight is 188.  Head and neck:  No ocular or oral lesions.  There is no scleral icterus.  Lungs:  Clear bilaterally.  Lymph:  There is no adenopathy in her neck.  Cardiac:  Regular rate and rhythm, with no murmurs, rubs or bruits.  Breasts:  Shows the lumpectomy in the right breast.  This is adjacent to the areola.  There is some slight contraction of the right breast.  There is some slight firmness of the right breast.  No distinct mass is noted in the right breast.  There is no right axillary adenopathy.  The left breast shows no masses, edema, or erythema.  No masses noted in the left axilla.  Back:  No tenderness of the spine, ribs, or hips.  Abdomen:  Soft with good bowel sounds. There is no fluid wave.  There is no palpable abdominal mass.  No palpable  hepatosplenomegaly.  Extremities:  Show no clubbing, cyanosis or edema.  No lymphedema is noted in the right arm.  LABORATORY STUDIES:  White cell count is 6.3, hemoglobin 14.5, hematocrit 43, platelet count 156,000.  IMPRESSION:  Mr. Cassidy Ortiz is a very nice 72 year old white female with a history of stage I infiltrating ductal carcinoma of the right breast. She underwent lumpectomy.  She had treatment over 13 years ago.  Her tumor was, I think, ER-positive.  I do not see a problem with her having problems with recurrence of her breast cancer.  I do not see any issues with respect to renal cell carcinoma.  I do not see a need for doing any scans on her for the kidney cancer.  We will go ahead and plan to get her back in 6 more months.  We need to watch out for her iron deficiency, which can be a problem for her.    ______________________________ Volanda Napoleon, M.D. PRE/MEDQ  D:  04/21/2013  T:  04/22/2013  Job:  [id number]

## 2013-05-26 ENCOUNTER — Ambulatory Visit (INDEPENDENT_AMBULATORY_CARE_PROVIDER_SITE_OTHER): Payer: Medicare Other | Admitting: Internal Medicine

## 2013-05-26 ENCOUNTER — Encounter: Payer: Self-pay | Admitting: Internal Medicine

## 2013-05-26 VITALS — BP 160/98 | HR 60 | Temp 98.6°F | Wt 186.0 lb

## 2013-05-26 DIAGNOSIS — R51 Headache: Secondary | ICD-10-CM

## 2013-05-26 DIAGNOSIS — R1013 Epigastric pain: Secondary | ICD-10-CM

## 2013-05-26 DIAGNOSIS — I10 Essential (primary) hypertension: Secondary | ICD-10-CM

## 2013-05-26 MED ORDER — TIZANIDINE HCL 2 MG PO CAPS: 2.0000 mg | ORAL_CAPSULE | Freq: Three times a day (TID) | ORAL | Status: DC | PRN

## 2013-05-26 NOTE — Progress Notes (Signed)
Subjective:    Patient ID: Cassidy Ortiz, female    DOB: July 21, 1941, 72 y.o.   MRN: [id number]  HPI 72 year old female with history of hypertension, hyperlipidemia, cerebral aneurysm status post coiling and hydrocephalus status post shunt placement presents for acute visit complaining of 2 weeks of headaches. She reports headache is not really pain more of a sensation of pressure over her head. She also notes some pain at the base of her skull extending down to her shoulders. She denies any trauma to her head. She denies any visual changes or nausea. She has not been taking any medication for pain. She has not recently changed any medication. She denies any chest pain or palpitations. She denies any fever or chills. Notably, she recently had evaluation of her CSF shunt at [facility] in December 2013 which showed that the shunt was likely non-functional. Decision was made to leave shunt in place.  She also notes recent epigastric abdominal discomfort ever since changing from Nexium to omeprazole. She initially changed to save money and is now planning to go back on Nexium. She has a history of duodenal ulcer. She denies any nausea or vomiting. She denies any blood in her stool or black stool. She has reordered Nexium from her mail order pharmacy and is waiting for the shipment.  Outpatient Encounter Prescriptions as of 05/26/2013  Medication Sig Dispense Refill  . ALPRAZolam (XANAX) 0.25 MG tablet Take 1 tablet (0.25 mg total) by mouth 3 (three) times daily as needed for anxiety.  60 tablet  4  . aspirin 81 MG tablet Take 81 mg by mouth daily. Takes 2 tablets daily      . atorvastatin (LIPITOR) 40 MG tablet Take 1 tablet (40 mg total) by mouth daily.  90 tablet  3  . Calcium 150 MG TABS 1,200 mg. Take 1,200 mg by mouth daily.      . carvedilol (COREG) 6.25 MG tablet Take 0.5 tablets (3.125 mg total) by mouth 2 (two) times daily.  90 tablet  4  . cloNIDine (CATAPRES) 0.1 MG tablet Take 1 tablet (0.1  mg total) by mouth 3 (three) times daily.  180 tablet  3  . esomeprazole (NEXIUM) 40 MG capsule 40 mg. Take 40 mg by mouth daily.      . hydrALAZINE (APRESOLINE) 100 MG tablet Take 1 tablet (100 mg total) by mouth 3 (three) times daily.  270 tablet  3  . levothyroxine (SYNTHROID, LEVOTHROID) 150 MCG tablet Take 1 tablet (150 mcg total) by mouth daily.  90 tablet  1  . Multiple Vitamin (MULTIVITAMIN) tablet Take 1 tablet by mouth daily.        Marland Kitchen rOPINIRole (REQUIP) 1 MG tablet Take 1 mg by mouth daily as needed.       . traZODone (DESYREL) 150 MG tablet Take 150 mg by mouth at bedtime.        . Vilazodone HCl (VIIBRYD) 40 MG TABS Take 1 tablet by mouth daily.        Marland Kitchen VITAMIN D, CHOLECALCIFEROL, PO Take 2,000 mg by mouth daily.      Marland Kitchen zolpidem (AMBIEN) 5 MG tablet Take 1 tablet (5 mg total) by mouth at bedtime as needed.  30 tablet  4  . Calcium Carbonate-Vit D-Min (CALCIUM 1200 PO) Take 1 tablet by mouth daily.      . furosemide (LASIX) 20 MG tablet Take 20 mg by mouth as needed.        No facility-administered encounter medications on file  as of 05/26/2013.   BP 160/98  Pulse 60  Temp(Src) 98.6 F (37 C) (Oral)  Wt 186 lb (84.369 kg)  BMI 29.12 kg/m2  SpO2 93%  Review of Systems  Constitutional: Negative for fever, chills, appetite change, fatigue and unexpected weight change.  HENT: Negative for ear pain, congestion, sore throat, trouble swallowing, neck pain, voice change and sinus pressure.   Eyes: Negative for visual disturbance.  Respiratory: Negative for cough, shortness of breath, wheezing and stridor.   Cardiovascular: Negative for chest pain, palpitations and leg swelling.  Gastrointestinal: Positive for abdominal pain. Negative for nausea, vomiting, diarrhea, constipation, blood in stool, abdominal distention and anal bleeding.  Genitourinary: Negative for dysuria and flank pain.  Musculoskeletal: Negative for myalgias, arthralgias and gait problem.  Skin: Negative for color  change and rash.  Neurological: Positive for headaches. Negative for dizziness, tremors, speech difficulty, weakness, light-headedness and numbness.  Hematological: Negative for adenopathy. Does not bruise/bleed easily.  Psychiatric/Behavioral: Negative for suicidal ideas, sleep disturbance and dysphoric mood. The patient is not nervous/anxious.        Objective:   Physical Exam  Constitutional: She is oriented to person, place, and time. She appears well-developed and well-nourished. No distress.  HENT:  Head: Normocephalic and atraumatic.  Right Ear: External ear normal.  Left Ear: External ear normal.  Nose: Nose normal.  Mouth/Throat: Oropharynx is clear and moist. No oropharyngeal exudate.  Eyes: Conjunctivae are normal. Pupils are equal, round, and reactive to light. Right eye exhibits no discharge. Left eye exhibits no discharge. No scleral icterus.  Neck: Normal range of motion. Neck supple. No tracheal deviation present. No thyromegaly present.  Cardiovascular: Normal rate, regular rhythm, normal heart sounds and intact distal pulses.  Exam reveals no gallop and no friction rub.   No murmur heard. Pulmonary/Chest: Effort normal and breath sounds normal. No accessory muscle usage. Not tachypneic. No respiratory distress. She has no decreased breath sounds. She has no wheezes. She has no rhonchi. She has no rales. She exhibits no tenderness.  Abdominal: Soft. Bowel sounds are normal. She exhibits no distension. There is no tenderness.  Musculoskeletal: Normal range of motion. She exhibits no edema and no tenderness.       Cervical back: She exhibits tenderness (over trapezius bilaterally), pain and spasm. She exhibits normal range of motion.  Lymphadenopathy:    She has no cervical adenopathy.  Neurological: She is alert and oriented to person, place, and time. No cranial nerve deficit. She exhibits normal muscle tone. Coordination normal.  Skin: Skin is warm and dry. No rash noted.  She is not diaphoretic. No erythema. No pallor.  Psychiatric: She has a normal mood and affect. Her behavior is normal. Judgment and thought content normal.          Assessment & Plan:

## 2013-05-26 NOTE — Assessment & Plan Note (Signed)
Symptoms are most consistent with muscular spasm of the trapezius muscle leading to tension type headache. Will try starting Zanaflex 2-4 mg up to 3 times daily as needed. 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 Intercranial aneurysm would also be a possibility given her history however symptoms are described as mild and this seems much less likely. We discussed that I would have a low threshold to repeat a CT of her head. She would prefer to hold off on imaging for now. She will call with an update tomorrow morning. If symptoms have not improved with Zanaflex, will plan to get CT of the head for further evaluation.

## 2013-05-26 NOTE — Assessment & Plan Note (Signed)
Wt Readings from Last 3 Encounters:  05/26/13 186 lb (84.369 kg)  04/21/13 188 lb (85.276 kg)  03/24/13 192 lb (87.091 kg)   Congratulated pt on weight loss. Encouraged continued compliance with healthy diet and regular physical activity.

## 2013-05-26 NOTE — Patient Instructions (Signed)
Start Zanaflex 2 to 4 mg up to three times daily as needed for muscle spasm.   Call or email with update tomorrow.  Follow up 1 week.

## 2013-05-26 NOTE — Assessment & Plan Note (Signed)
Symptoms are concerning for recurrent gastritis or ulcer. Will resume Nexium. Samples given today. Patient will call if symptoms are not improving.

## 2013-05-26 NOTE — Assessment & Plan Note (Signed)
BP Readings from Last 3 Encounters:  05/26/13 160/98  04/21/13 137/70  03/24/13 166/110   BP elevated today, however has been well-controlled in the past. Suspect elevated BP related to neck pain. Will monitor and have her follow up 1 week.

## 2013-06-03 ENCOUNTER — Encounter: Payer: Self-pay | Admitting: Internal Medicine

## 2013-06-03 ENCOUNTER — Ambulatory Visit (INDEPENDENT_AMBULATORY_CARE_PROVIDER_SITE_OTHER): Payer: Medicare Other | Admitting: Internal Medicine

## 2013-06-03 VITALS — BP 150/90 | HR 64 | Temp 98.6°F | Wt 185.0 lb

## 2013-06-03 DIAGNOSIS — K219 Gastro-esophageal reflux disease without esophagitis: Secondary | ICD-10-CM

## 2013-06-03 DIAGNOSIS — I10 Essential (primary) hypertension: Secondary | ICD-10-CM | POA: Diagnosis not present

## 2013-06-03 DIAGNOSIS — R1013 Epigastric pain: Secondary | ICD-10-CM

## 2013-06-03 DIAGNOSIS — R51 Headache: Secondary | ICD-10-CM | POA: Diagnosis not present

## 2013-06-03 NOTE — Assessment & Plan Note (Signed)
BP Readings from Last 3 Encounters:  06/03/13 150/90  05/26/13 160/98  04/21/13 137/70   Blood pressure has been running slightly higher recently, typically 140-160 over 90s. Patient is compliant with medication. Reluctant to increase dose of beta blocker given bradycardia. Reluctant to add ACE inhibitor or ARB given chronic renal insufficiency. Will defer to cardiology. Followup to be scheduled for this week.

## 2013-06-03 NOTE — Assessment & Plan Note (Signed)
Mild persistent pain despite changing back to Nexium. If symptoms persist, discussed potential need for follow up endoscopy. She will try increasing Nexium to bid for a few days and will email or call with update.

## 2013-06-03 NOTE — Progress Notes (Signed)
Subjective:    Patient ID: Cassidy Ortiz, female    DOB: 1941-08-27, 72 y.o.   MRN: [id number]  HPI 72 year old female presents for followup after recent visit during which she complained of headache and abdominal pain. She reports that symptoms of headache have resolved with use of Zanaflex. She is not taking this medication on a regular basis anymore. She reports that tension and spasm in her shoulders has also improved.  In regards to epigastric abdominal pain, she reports that symptoms are slightly improved but she continues to have some burning discomfort in the epigastric area despite changing back to Nexium. Symptoms initially began when she changed from Nexium to omeprazole. She denies any nausea, change in appetite, change in bowel habits.  Outpatient Encounter Prescriptions as of 06/03/2013  Medication Sig Dispense Refill  . ALPRAZolam (XANAX) 0.25 MG tablet Take 1 tablet (0.25 mg total) by mouth 3 (three) times daily as needed for anxiety.  60 tablet  4  . aspirin 81 MG tablet Take 81 mg by mouth daily. Takes 2 tablets daily      . atorvastatin (LIPITOR) 40 MG tablet Take 1 tablet (40 mg total) by mouth daily.  90 tablet  3  . Calcium 150 MG TABS 1,200 mg. Take 1,200 mg by mouth daily.      . Calcium Carbonate-Vit D-Min (CALCIUM 1200 PO) Take 1 tablet by mouth daily.      . carvedilol (COREG) 6.25 MG tablet Take 0.5 tablets (3.125 mg total) by mouth 2 (two) times daily.  90 tablet  4  . cloNIDine (CATAPRES) 0.1 MG tablet Take 1 tablet (0.1 mg total) by mouth 3 (three) times daily.  180 tablet  3  . esomeprazole (NEXIUM) 40 MG capsule 40 mg. Take 40 mg by mouth daily.      . furosemide (LASIX) 20 MG tablet Take 20 mg by mouth as needed.       . hydrALAZINE (APRESOLINE) 100 MG tablet Take 1 tablet (100 mg total) by mouth 3 (three) times daily.  270 tablet  3  . levothyroxine (SYNTHROID, LEVOTHROID) 150 MCG tablet Take 1 tablet (150 mcg total) by mouth daily.  90 tablet  1  . Multiple  Vitamin (MULTIVITAMIN) tablet Take 1 tablet by mouth daily.        Marland Kitchen omeprazole (PRILOSEC) 20 MG capsule Take 1 capsule (20 mg total) by mouth 2 (two) times daily.  180 capsule  3  . rOPINIRole (REQUIP) 1 MG tablet Take 1 mg by mouth daily as needed.       . tizanidine (ZANAFLEX) 2 MG capsule Take 1-2 capsules (2-4 mg total) by mouth 3 (three) times daily as needed for muscle spasms.  30 capsule  1  . traZODone (DESYREL) 150 MG tablet Take 150 mg by mouth at bedtime.        . Vilazodone HCl (VIIBRYD) 40 MG TABS Take 1 tablet by mouth daily.        Marland Kitchen VITAMIN D, CHOLECALCIFEROL, PO Take 2,000 mg by mouth daily.      Marland Kitchen zolpidem (AMBIEN) 5 MG tablet Take 1 tablet (5 mg total) by mouth at bedtime as needed.  30 tablet  4   No facility-administered encounter medications on file as of 06/03/2013.   BP 150/90  Pulse 64  Temp(Src) 98.6 F (37 C) (Oral)  Wt 185 lb (83.915 kg)  BMI 28.97 kg/m2  SpO2 93%  Review of Systems  Constitutional: Negative for fever, chills, appetite change, fatigue and unexpected  weight change.  HENT: Negative for ear pain, congestion, sore throat, trouble swallowing, neck pain, voice change and sinus pressure.   Eyes: Negative for visual disturbance.  Respiratory: Negative for cough, shortness of breath, wheezing and stridor.   Cardiovascular: Negative for chest pain, palpitations and leg swelling.  Gastrointestinal: Positive for abdominal pain. Negative for nausea, vomiting, diarrhea, constipation, blood in stool, abdominal distention and anal bleeding.  Genitourinary: Negative for dysuria and flank pain.  Musculoskeletal: Negative for myalgias, arthralgias and gait problem.  Skin: Negative for color change and rash.  Neurological: Negative for dizziness and headaches.  Hematological: Negative for adenopathy. Does not bruise/bleed easily.  Psychiatric/Behavioral: Negative for suicidal ideas, sleep disturbance and dysphoric mood. The patient is not nervous/anxious.         Objective:   Physical Exam  Constitutional: She is oriented to person, place, and time. She appears well-developed and well-nourished. No distress.  HENT:  Head: Normocephalic and atraumatic.  Right Ear: External ear normal.  Left Ear: External ear normal.  Nose: Nose normal.  Mouth/Throat: Oropharynx is clear and moist. No oropharyngeal exudate.  Eyes: Conjunctivae are normal. Pupils are equal, round, and reactive to light. Right eye exhibits no discharge. Left eye exhibits no discharge. No scleral icterus.  Neck: Normal range of motion. Neck supple. No tracheal deviation present. No thyromegaly present.  Cardiovascular: Normal rate, regular rhythm, normal heart sounds and intact distal pulses.  Exam reveals no gallop and no friction rub.   No murmur heard. Pulmonary/Chest: Effort normal and breath sounds normal. No accessory muscle usage. Not tachypneic. No respiratory distress. She has no decreased breath sounds. She has no wheezes. She has no rhonchi. She has no rales. She exhibits no tenderness.  Musculoskeletal: Normal range of motion. She exhibits no edema and no tenderness.  Lymphadenopathy:    She has no cervical adenopathy.  Neurological: She is alert and oriented to person, place, and time. No cranial nerve deficit. She exhibits normal muscle tone. Coordination normal.  Skin: Skin is warm and dry. No rash noted. She is not diaphoretic. No erythema. No pallor.  Psychiatric: She has a normal mood and affect. Her behavior is normal. Judgment and thought content normal.          Assessment & Plan:

## 2013-06-03 NOTE — Assessment & Plan Note (Signed)
Headache symptoms have resolved with use of Zanaflex. Symptoms most likely related to tension from spasm of the trapezius. Will continue to monitor.

## 2013-06-08 ENCOUNTER — Other Ambulatory Visit: Payer: Self-pay | Admitting: Internal Medicine

## 2013-06-08 NOTE — Telephone Encounter (Signed)
traZODone (DESYREL) 150 MG tablet  #90  Vilazodone HCl (VIIBRYD) 40 MG TABS #90

## 2013-06-09 MED ORDER — VILAZODONE HCL 40 MG PO TABS: 40.0000 mg | ORAL_TABLET | Freq: Every day | ORAL | Status: DC

## 2013-06-09 MED ORDER — TRAZODONE HCL 150 MG PO TABS: 150.0000 mg | ORAL_TABLET | Freq: Every day | ORAL | Status: DC

## 2013-06-09 NOTE — Telephone Encounter (Signed)
Ok to refill 

## 2013-06-10 NOTE — Telephone Encounter (Signed)
Rx sent 

## 2013-06-17 ENCOUNTER — Telehealth: Payer: Self-pay | Admitting: Internal Medicine

## 2013-06-17 NOTE — Telephone Encounter (Signed)
Trazodone 150 mg and Viibryd 40 mg was sent to [facility] and they will not refill the medication because they told them they need some additional information from the office. Just wanted to give [place] a heads up about this. Have you seen anything in reference to this?

## 2013-06-17 NOTE — Telephone Encounter (Signed)
Asking you to please call regarding them having a problem with the drug company that sends their prescriptions, and she would like to talk to you Arbie Cookey) regarding her last prescription.

## 2013-06-17 NOTE — Telephone Encounter (Signed)
I have not seen anything in reference to this

## 2013-06-22 ENCOUNTER — Telehealth: Payer: Self-pay | Admitting: *Deleted

## 2013-06-22 MED ORDER — VILAZODONE HCL 40 MG PO TABS: 40.0000 mg | ORAL_TABLET | Freq: Every day | ORAL | Status: DC

## 2013-06-22 NOTE — Telephone Encounter (Signed)
Called [phone] for prior authorization and the viibryd, received fax form placed in Dr. Derry Skill folder

## 2013-06-22 NOTE — Telephone Encounter (Signed)
Patient only received a 30 day supply of her Trazodone 150 mg . She is needing a 90 day supply.

## 2013-06-22 NOTE — Telephone Encounter (Signed)
Nothing in reference to this has came through for the patient. She called today requesting a refill on her Viibryd. Sent [facility] 30 day supply to the [facility] [address] per patient request.

## 2013-06-23 NOTE — Telephone Encounter (Signed)
Spoke with patient and she requested only a 30 day supply be sent to local pharmacy until she received her mail order.

## 2013-06-24 NOTE — Telephone Encounter (Signed)
Only a 30 day was sent to mail order, will send correct prescription with PA forms in Dr. Gilford Rile folder.

## 2013-07-02 ENCOUNTER — Ambulatory Visit (INDEPENDENT_AMBULATORY_CARE_PROVIDER_SITE_OTHER): Payer: Medicare Other | Admitting: Internal Medicine

## 2013-07-02 ENCOUNTER — Telehealth: Payer: Self-pay | Admitting: *Deleted

## 2013-07-02 ENCOUNTER — Encounter: Payer: Self-pay | Admitting: Internal Medicine

## 2013-07-02 VITALS — BP 160/98 | HR 61 | Temp 98.7°F | Wt 186.0 lb

## 2013-07-02 DIAGNOSIS — F3289 Other specified depressive episodes: Secondary | ICD-10-CM

## 2013-07-02 DIAGNOSIS — I10 Essential (primary) hypertension: Secondary | ICD-10-CM

## 2013-07-02 DIAGNOSIS — F329 Major depressive disorder, single episode, unspecified: Secondary | ICD-10-CM | POA: Diagnosis not present

## 2013-07-02 MED ORDER — CARVEDILOL 3.125 MG PO TABS: 3.1250 mg | ORAL_TABLET | Freq: Two times a day (BID) | ORAL | Status: DC

## 2013-07-02 MED ORDER — TRAZODONE HCL 150 MG PO TABS: 150.0000 mg | ORAL_TABLET | Freq: Every day | ORAL | Status: DC

## 2013-07-02 NOTE — Telephone Encounter (Signed)
Patient scheduled to come in to discuss.

## 2013-07-02 NOTE — Assessment & Plan Note (Signed)
Symptoms very well controlled with Vibryd. Previous failure of symptom control with Sertraline, Paroxetine, Wellbutrin, Fluoxetine. Stable on Viibryd for >2years. PA filled out and Rx written. Will continue to follow.

## 2013-07-02 NOTE — Telephone Encounter (Signed)
PA request form for Viibryd was faxed to [phone]

## 2013-07-02 NOTE — Progress Notes (Signed)
Subjective:    Patient ID: Cassidy Ortiz, female    DOB: [birth year], 72 y.o.   MRN: [id number]  HPI 72 year old female with history of hypertension, hyperlipidemia, depression presents for followup. Her symptoms of depression have been ongoing for years. She has tried multiple medications including fluoxetine, sertraline, Wellbutrin, all with poor control of her symptoms. Some medications including sertraline led to diarrhea and increased drowsiness. Over the last 2 years she has been taking Viibryd. She has had excellent control of her symptoms of depression on this medication. She has not had any side effects. She would like to continue this medication.  She notes increased stressors at home recently with multiple family members visiting. She notes that her blood pressure has been up on several occasions in our office. She does not check her blood pressure at home. She denies any headache, chest pain, palpitations. She had recently decreased her dose of carvedilol to 3.125 mg twice daily. She is compliant with her medication.  Outpatient Encounter Prescriptions as of 07/02/2013  Medication Sig Dispense Refill  . ALPRAZolam (XANAX) 0.25 MG tablet Take 1 tablet (0.25 mg total) by mouth 3 (three) times daily as needed for anxiety.  60 tablet  4  . aspirin 81 MG tablet Take 81 mg by mouth daily. Takes 2 tablets daily      . atorvastatin (LIPITOR) 40 MG tablet Take 1 tablet (40 mg total) by mouth daily.  90 tablet  3  . Calcium 150 MG TABS 1,200 mg. Take 1,200 mg by mouth daily.      . Calcium Carbonate-Vit D-Min (CALCIUM 1200 PO) Take 1 tablet by mouth daily.      . carvedilol (COREG) 3.125 MG tablet Take 1 tablet (3.125 mg total) by mouth 2 (two) times daily.  180 tablet  4  . cloNIDine (CATAPRES) 0.1 MG tablet Take 1 tablet (0.1 mg total) by mouth 3 (three) times daily.  180 tablet  3  . esomeprazole (NEXIUM) 40 MG capsule 40 mg. Take 40 mg by mouth daily.      . furosemide (LASIX) 20 MG tablet Take  20 mg by mouth as needed.       . hydrALAZINE (APRESOLINE) 100 MG tablet Take 1 tablet (100 mg total) by mouth 3 (three) times daily.  270 tablet  3  . levothyroxine (SYNTHROID, LEVOTHROID) 150 MCG tablet Take 1 tablet (150 mcg total) by mouth daily.  90 tablet  1  . Multiple Vitamin (MULTIVITAMIN) tablet Take 1 tablet by mouth daily.        Marland Kitchen omeprazole (PRILOSEC) 20 MG capsule Take 1 capsule (20 mg total) by mouth 2 (two) times daily.  180 capsule  3  . rOPINIRole (REQUIP) 1 MG tablet Take 1 mg by mouth daily as needed.       . tizanidine (ZANAFLEX) 2 MG capsule Take 1-2 capsules (2-4 mg total) by mouth 3 (three) times daily as needed for muscle spasms.  30 capsule  1  . traZODone (DESYREL) 150 MG tablet Take 1 tablet (150 mg total) by mouth at bedtime.  90 tablet  2  . Vilazodone HCl (VIIBRYD) 40 MG TABS Take 1 tablet (40 mg total) by mouth daily.  30 tablet  0  . VITAMIN D, CHOLECALCIFEROL, PO Take 2,000 mg by mouth daily.      Marland Kitchen zolpidem (AMBIEN) 5 MG tablet Take 1 tablet (5 mg total) by mouth at bedtime as needed.  30 tablet  4  . [DISCONTINUED] carvedilol (COREG) 6.25 MG  tablet Take 0.5 tablets (3.125 mg total) by mouth 2 (two) times daily.  90 tablet  4  . [DISCONTINUED] traZODone (DESYREL) 150 MG tablet Take 1 tablet (150 mg total) by mouth at bedtime.  30 tablet  6   No facility-administered encounter medications on file as of 07/02/2013.   BP 160/98  Pulse 61  Temp(Src) 98.7 F (37.1 C) (Oral)  Wt 186 lb (84.369 kg)  BMI 29.12 kg/m2  SpO2 93%  Review of Systems  Constitutional: Negative for fever, chills, appetite change, fatigue and unexpected weight change.  HENT: Negative for ear pain, congestion, sore throat, trouble swallowing, neck pain, voice change and sinus pressure.   Eyes: Negative for visual disturbance.  Respiratory: Negative for cough, shortness of breath, wheezing and stridor.   Cardiovascular: Negative for chest pain, palpitations and leg swelling.   Gastrointestinal: Negative for nausea, vomiting, abdominal pain, diarrhea, constipation, blood in stool, abdominal distention and anal bleeding.  Genitourinary: Negative for dysuria and flank pain.  Musculoskeletal: Negative for myalgias, arthralgias and gait problem.  Skin: Negative for color change and rash.  Neurological: Negative for dizziness and headaches.  Hematological: Negative for adenopathy. Does not bruise/bleed easily.  Psychiatric/Behavioral: Negative for suicidal ideas, sleep disturbance and dysphoric mood. The patient is not nervous/anxious.        Objective:   Physical Exam  Constitutional: She is oriented to person, place, and time. She appears well-developed and well-nourished. No distress.  HENT:  Head: Normocephalic and atraumatic.  Right Ear: External ear normal.  Left Ear: External ear normal.  Nose: Nose normal.  Mouth/Throat: Oropharynx is clear and moist. No oropharyngeal exudate.  Eyes: Conjunctivae are normal. Pupils are equal, round, and reactive to light. Right eye exhibits no discharge. Left eye exhibits no discharge. No scleral icterus.  Neck: Normal range of motion. Neck supple. No tracheal deviation present. No thyromegaly present.  Cardiovascular: Normal rate, regular rhythm, normal heart sounds and intact distal pulses.  Exam reveals no gallop and no friction rub.   No murmur heard. Pulmonary/Chest: Effort normal and breath sounds normal. No accessory muscle usage. Not tachypneic. No respiratory distress. She has no decreased breath sounds. She has no wheezes. She has no rhonchi. She has no rales. She exhibits no tenderness.  Musculoskeletal: Normal range of motion. She exhibits no edema and no tenderness.  Lymphadenopathy:    She has no cervical adenopathy.  Neurological: She is alert and oriented to person, place, and time. No cranial nerve deficit. She exhibits normal muscle tone. Coordination normal.  Skin: Skin is warm and dry. No rash noted. She  is not diaphoretic. No erythema. No pallor.  Psychiatric: She has a normal mood and affect. Her behavior is normal. Judgment and thought content normal.          Assessment & Plan:

## 2013-07-02 NOTE — Assessment & Plan Note (Signed)
BP elevated today. Likely related to increased stressors recently. Discussed increasing Carvedilol to 6.25mg  bid for next few days and monitoring BP. Pt will call if consistently >140/90. Follow up with Dr. Rockey Situ to be scheduled. Follow up here prn and in 09/2013.

## 2013-07-05 NOTE — Telephone Encounter (Signed)
Received a fax from [facility] has been APPROVED   07.25.2014-07.25.2016

## 2013-07-13 ENCOUNTER — Other Ambulatory Visit: Payer: Self-pay | Admitting: *Deleted

## 2013-07-13 MED ORDER — VILAZODONE HCL 40 MG PO TABS: 40.0000 mg | ORAL_TABLET | Freq: Every day | ORAL | Status: DC

## 2013-07-13 NOTE — Telephone Encounter (Signed)
Patient left message on voicemail stating she need her Vibriid re-faxed to the pharmacy. Rx re-faxed to pharmacy.

## 2013-08-07 ENCOUNTER — Other Ambulatory Visit: Payer: Self-pay | Admitting: Cardiovascular Disease

## 2013-08-10 ENCOUNTER — Other Ambulatory Visit: Payer: Self-pay | Admitting: *Deleted

## 2013-08-10 MED ORDER — HYDRALAZINE HCL 100 MG PO TABS: 100.0000 mg | ORAL_TABLET | Freq: Three times a day (TID) | ORAL | Status: DC

## 2013-08-10 NOTE — Telephone Encounter (Signed)
Refilled Hydralazine sent to [facility].

## 2013-08-11 ENCOUNTER — Other Ambulatory Visit: Payer: Self-pay | Admitting: *Deleted

## 2013-08-11 MED ORDER — ATORVASTATIN CALCIUM 40 MG PO TABS: 40.0000 mg | ORAL_TABLET | Freq: Every day | ORAL | Status: DC

## 2013-08-11 NOTE — Telephone Encounter (Signed)
Patient requesting refill on Atorvastatin, refill sent to [facility] per patient request.

## 2013-08-20 ENCOUNTER — Other Ambulatory Visit: Payer: Self-pay

## 2013-08-20 DIAGNOSIS — Z1231 Encounter for screening mammogram for malignant neoplasm of breast: Secondary | ICD-10-CM

## 2013-08-24 ENCOUNTER — Encounter: Payer: Self-pay | Admitting: Internal Medicine

## 2013-08-24 ENCOUNTER — Ambulatory Visit (INDEPENDENT_AMBULATORY_CARE_PROVIDER_SITE_OTHER): Payer: Medicare Other | Admitting: Internal Medicine

## 2013-08-24 VITALS — BP 126/74 | HR 56 | Temp 98.6°F | Resp 14 | Wt 187.5 lb

## 2013-08-24 DIAGNOSIS — F329 Major depressive disorder, single episode, unspecified: Secondary | ICD-10-CM

## 2013-08-24 DIAGNOSIS — R51 Headache: Secondary | ICD-10-CM

## 2013-08-24 MED ORDER — TIZANIDINE HCL 2 MG PO CAPS: 2.0000 mg | ORAL_CAPSULE | Freq: Three times a day (TID) | ORAL | Status: DC | PRN

## 2013-08-24 MED ORDER — MIRTAZAPINE 15 MG PO TABS: 15.0000 mg | ORAL_TABLET | Freq: Every day | ORAL | Status: DC

## 2013-08-24 NOTE — Assessment & Plan Note (Signed)
Symptoms of headache are most consistent with tension headache related to recent increase in anxiety and lack of sleep, leading to tension in the trapezius. Will try adding back Zanaflex 2-4 mg as needed for headache. However, we discussed patient's extensive history including history of cerebral aneurysm. If symptoms are persisting or worsening, would favor getting imaging with CT angiogram.

## 2013-08-24 NOTE — Assessment & Plan Note (Signed)
Recent worsening of depression. Will start Remeron 15 mg at bedtime to help with depression and sleep. We discussed that we may need to titrate up the dose of this medication. We also discussed some additional alternative medications including Abilify and Pristique if Remeron is not helpful. Recommended counseling however patient declines. Plan followup in one week or sooner as needed.

## 2013-08-24 NOTE — Patient Instructions (Signed)
Start Zanaflex tonight 2-4mg  to help with headache. Call or email tomorrow with update. If no improvement, we will plan to get imaging for further evaluation.  Start Remeron 15mg  at bedtime to help with sleep.

## 2013-08-24 NOTE — Progress Notes (Signed)
Subjective:    Patient ID: Cassidy Ortiz, female    DOB: [birth year], 72 y.o.   MRN: [id number]  HPI 72 year old female with history of depression, cerebral aneurysm, hypertension, hyperlipidemia presents for acute visit complaining of 4 days of headache. Her husband reports that this is been a very difficult time for her. They had relatives staying with them for over one month. This apparently brought back many memories for the patient leading to increased symptoms of depression and anxiety. Patient has stayed in her room over the last week. She has refused to interact with family members. She has refused to eat on a regular basis. She reports that she is "done" with her family and no longer wishes to interact with them. Prior to this episode, her symptoms of depressed mood have been well-controlled with Viibryd. She is not interested in counseling as she felt this was not helpful in the past. She reports that she has not been sleeping well and typically has been going to bed around 4 AM. She also reports significant anxiety.  Over the last 4 days she has had aching headache over the front of her head and posterior upper neck and scalp. Pain does not radiate. She denies any visual changes or focal neurologic changes. She has had mild intermittent nausea. She has taken over-the-counter Tylenol with no improvement.  Outpatient Prescriptions Prior to Visit  Medication Sig Dispense Refill  . ALPRAZolam (XANAX) 0.25 MG tablet Take 1 tablet (0.25 mg total) by mouth 3 (three) times daily as needed for anxiety.  60 tablet  4  . aspirin 81 MG tablet Take 81 mg by mouth daily. Takes 2 tablets daily      . atorvastatin (LIPITOR) 40 MG tablet Take 1 tablet (40 mg total) by mouth daily.  90 tablet  1  . Calcium 150 MG TABS 1,200 mg. Take 1,200 mg by mouth daily.      . Calcium Carbonate-Vit D-Min (CALCIUM 1200 PO) Take 1 tablet by mouth daily.      . carvedilol (COREG) 3.125 MG tablet Take 1 tablet (3.125 mg total)  by mouth 2 (two) times daily.  180 tablet  4  . cloNIDine (CATAPRES) 0.1 MG tablet Take 1 tablet (0.1 mg total) by mouth 3 (three) times daily.  180 tablet  3  . esomeprazole (NEXIUM) 40 MG capsule 40 mg. Take 40 mg by mouth daily.      . furosemide (LASIX) 20 MG tablet Take 20 mg by mouth as needed.       . hydrALAZINE (APRESOLINE) 100 MG tablet Take 1 tablet (100 mg total) by mouth 3 (three) times daily.  270 tablet  3  . levothyroxine (SYNTHROID, LEVOTHROID) 150 MCG tablet Take 1 tablet (150 mcg total) by mouth daily.  90 tablet  1  . Multiple Vitamin (MULTIVITAMIN) tablet Take 1 tablet by mouth daily.        Marland Kitchen omeprazole (PRILOSEC) 20 MG capsule Take 1 capsule (20 mg total) by mouth 2 (two) times daily.  180 capsule  3  . rOPINIRole (REQUIP) 1 MG tablet Take 1 mg by mouth daily as needed.       . traZODone (DESYREL) 150 MG tablet Take 1 tablet (150 mg total) by mouth at bedtime.  90 tablet  2  . Vilazodone HCl (VIIBRYD) 40 MG TABS Take 1 tablet (40 mg total) by mouth daily.  90 tablet  1  . VITAMIN D, CHOLECALCIFEROL, PO Take 2,000 mg by mouth daily.      Marland Kitchen  zolpidem (AMBIEN) 5 MG tablet Take 1 tablet (5 mg total) by mouth at bedtime as needed.  30 tablet  4  . tizanidine (ZANAFLEX) 2 MG capsule Take 1-2 capsules (2-4 mg total) by mouth 3 (three) times daily as needed for muscle spasms.  30 capsule  1   No facility-administered medications prior to visit.   BP 126/74  Pulse 56  Temp(Src) 98.6 F (37 C) (Oral)  Resp 14  Wt 187 lb 8 oz (85.049 kg)  BMI 29.36 kg/m2  SpO2 96%  Review of Systems  Constitutional: Negative for fever, chills, appetite change, fatigue and unexpected weight change.  HENT: Negative for ear pain, congestion, sore throat, trouble swallowing, neck pain, voice change and sinus pressure.   Eyes: Negative for visual disturbance.  Respiratory: Negative for cough, shortness of breath, wheezing and stridor.   Cardiovascular: Negative for chest pain, palpitations and  leg swelling.  Gastrointestinal: Negative for nausea, vomiting, abdominal pain, diarrhea, constipation, blood in stool, abdominal distention and anal bleeding.  Genitourinary: Negative for dysuria and flank pain.  Musculoskeletal: Negative for myalgias, arthralgias and gait problem.  Skin: Negative for color change and rash.  Neurological: Positive for headaches. Negative for dizziness.  Hematological: Negative for adenopathy. Does not bruise/bleed easily.  Psychiatric/Behavioral: Positive for sleep disturbance and dysphoric mood. Negative for suicidal ideas. The patient is nervous/anxious.        Objective:   Physical Exam  Constitutional: She is oriented to person, place, and time. She appears well-developed and well-nourished. No distress.  HENT:  Head: Normocephalic and atraumatic.  Right Ear: External ear normal.  Left Ear: External ear normal.  Nose: Nose normal.  Mouth/Throat: Oropharynx is clear and moist. No oropharyngeal exudate.  Eyes: Conjunctivae are normal. Pupils are equal, round, and reactive to light. Right eye exhibits no discharge. Left eye exhibits no discharge. No scleral icterus.  Neck: Normal range of motion. Neck supple. No tracheal deviation present. No thyromegaly present.  Cardiovascular: Normal rate, regular rhythm, normal heart sounds and intact distal pulses.  Exam reveals no gallop and no friction rub.   No murmur heard. Pulmonary/Chest: Effort normal and breath sounds normal. No accessory muscle usage. Not tachypneic. No respiratory distress. She has no decreased breath sounds. She has no wheezes. She has no rhonchi. She has no rales. She exhibits no tenderness.  Musculoskeletal: Normal range of motion. She exhibits no edema.       Cervical back: She exhibits tenderness, pain and spasm. She exhibits normal range of motion and no bony tenderness.  Lymphadenopathy:    She has no cervical adenopathy.  Neurological: She is alert and oriented to person, place,  and time. No cranial nerve deficit. She exhibits normal muscle tone. Coordination normal.  Skin: Skin is warm and dry. No rash noted. She is not diaphoretic. No erythema. No pallor.  Psychiatric: Her speech is normal and behavior is normal. Judgment and thought content normal. Her mood appears anxious. Cognition and memory are normal. She exhibits a depressed mood.          Assessment & Plan:

## 2013-08-27 ENCOUNTER — Other Ambulatory Visit: Payer: Self-pay | Admitting: Internal Medicine

## 2013-08-27 NOTE — Telephone Encounter (Signed)
Eprescribed.

## 2013-09-02 ENCOUNTER — Ambulatory Visit: Payer: Medicare Other | Admitting: Internal Medicine

## 2013-09-08 ENCOUNTER — Telehealth: Payer: Self-pay | Admitting: Internal Medicine

## 2013-09-08 NOTE — Telephone Encounter (Signed)
Pt says that her mail order has not received the synthroid medication and nexium rx. [facility] uses [facility].

## 2013-09-10 ENCOUNTER — Ambulatory Visit (INDEPENDENT_AMBULATORY_CARE_PROVIDER_SITE_OTHER): Payer: Medicare Other | Admitting: Internal Medicine

## 2013-09-10 ENCOUNTER — Encounter: Payer: Self-pay | Admitting: Internal Medicine

## 2013-09-10 VITALS — BP 132/98 | HR 66 | Temp 98.8°F | Wt 188.0 lb

## 2013-09-10 DIAGNOSIS — D649 Anemia, unspecified: Secondary | ICD-10-CM

## 2013-09-10 DIAGNOSIS — Z23 Encounter for immunization: Secondary | ICD-10-CM | POA: Diagnosis not present

## 2013-09-10 DIAGNOSIS — I10 Essential (primary) hypertension: Secondary | ICD-10-CM

## 2013-09-10 DIAGNOSIS — F329 Major depressive disorder, single episode, unspecified: Secondary | ICD-10-CM | POA: Diagnosis not present

## 2013-09-10 DIAGNOSIS — E039 Hypothyroidism, unspecified: Secondary | ICD-10-CM

## 2013-09-10 DIAGNOSIS — R51 Headache: Secondary | ICD-10-CM

## 2013-09-10 LAB — COMPREHENSIVE METABOLIC PANEL
Albumin: 4.2 g/dL (ref 3.5–5.2)
Alkaline Phosphatase: 67 U/L (ref 39–117)
BUN: 30 mg/dL — ABNORMAL HIGH (ref 6–23)
Calcium: 9.8 mg/dL (ref 8.4–10.5)
Chloride: 104 mEq/L (ref 96–112)
Creat: 1.39 mg/dL — ABNORMAL HIGH (ref 0.50–1.10)
Glucose, Bld: 97 mg/dL (ref 70–99)
Potassium: 4 mEq/L (ref 3.5–5.3)

## 2013-09-10 LAB — CBC WITH DIFFERENTIAL/PLATELET
Hemoglobin: 13.6 g/dL (ref 12.0–15.0)
Lymphs Abs: 1.8 10*3/uL (ref 0.7–4.0)
Monocytes Relative: 8 % (ref 3–12)
Neutro Abs: 4.1 10*3/uL (ref 1.7–7.7)
Neutrophils Relative %: 61 % (ref 43–77)
RBC: 4.47 MIL/uL (ref 3.87–5.11)

## 2013-09-10 MED ORDER — ESOMEPRAZOLE MAGNESIUM 40 MG PO CPDR: 40.0000 mg | DELAYED_RELEASE_CAPSULE | Freq: Every day | ORAL | Status: DC

## 2013-09-10 MED ORDER — LEVOTHYROXINE SODIUM 150 MCG PO TABS: ORAL_TABLET | ORAL | Status: DC

## 2013-09-10 NOTE — Assessment & Plan Note (Signed)
Will check CBC with labs.

## 2013-09-10 NOTE — Assessment & Plan Note (Signed)
Recent worsening of symptoms. Improved with addition of Remeron. Will continue. Follow up 3 months and prn.

## 2013-09-10 NOTE — Progress Notes (Signed)
Subjective:    Patient ID: Cassidy Ortiz, female    DOB: 07-17-1941, 72 y.o.   MRN: [id number]  HPI 72 year old female with history of cerebral aneurysm, breast cancer, depression presents for followup. At her last visit, she complained of severe headache. She reports that symptoms have resolved with use of Zanaflex. She also complained of worsening symptoms of depression. She reports that over the last few weeks symptoms have gradually improved. She is sleeping better with use of Remeron. She is more interactive with her family. She notes some ongoing fatigue. She reports it is difficult to distinguish between generalized fatigue and depression. She denies any focal symptoms such as chest pain, shortness of breath. She denies change in appetite. She is trying to follow a healthier diet and is planning to adopt a lower carbohydrate, Paleo style diet.  Outpatient Encounter Prescriptions as of 09/10/2013  Medication Sig Dispense Refill  . ALPRAZolam (XANAX) 0.25 MG tablet Take 1 tablet (0.25 mg total) by mouth 3 (three) times daily as needed for anxiety.  60 tablet  4  . aspirin 81 MG tablet Take 81 mg by mouth daily. Takes 2 tablets daily      . atorvastatin (LIPITOR) 40 MG tablet Take 1 tablet (40 mg total) by mouth daily.  90 tablet  1  . Calcium 150 MG TABS 1,200 mg. Take 1,200 mg by mouth daily.      . Calcium Carbonate-Vit D-Min (CALCIUM 1200 PO) Take 1 tablet by mouth daily.      . carvedilol (COREG) 3.125 MG tablet Take 1 tablet (3.125 mg total) by mouth 2 (two) times daily.  180 tablet  4  . cloNIDine (CATAPRES) 0.1 MG tablet Take 1 tablet (0.1 mg total) by mouth 3 (three) times daily.  180 tablet  3  . esomeprazole (NEXIUM) 40 MG capsule Take 1 capsule (40 mg total) by mouth daily before breakfast. Take 40 mg by mouth daily.  90 capsule  4  . furosemide (LASIX) 20 MG tablet Take 20 mg by mouth as needed.       . hydrALAZINE (APRESOLINE) 100 MG tablet Take 1 tablet (100 mg total) by mouth 3  (three) times daily.  270 tablet  3  . levothyroxine (SYNTHROID) 150 MCG tablet TAKE 1 TABLET DAILY  90 tablet  3  . mirtazapine (REMERON) 15 MG tablet Take 1 tablet (15 mg total) by mouth at bedtime.  30 tablet  3  . Multiple Vitamin (MULTIVITAMIN) tablet Take 1 tablet by mouth daily.        Marland Kitchen omeprazole (PRILOSEC) 20 MG capsule Take 1 capsule (20 mg total) by mouth 2 (two) times daily.  180 capsule  3  . rOPINIRole (REQUIP) 1 MG tablet Take 1 mg by mouth daily as needed.       . tizanidine (ZANAFLEX) 2 MG capsule Take 1-2 capsules (2-4 mg total) by mouth 3 (three) times daily as needed for muscle spasms.  30 capsule  3  . traZODone (DESYREL) 150 MG tablet Take 1 tablet (150 mg total) by mouth at bedtime.  90 tablet  2  . Vilazodone HCl (VIIBRYD) 40 MG TABS Take 1 tablet (40 mg total) by mouth daily.  90 tablet  1  . VITAMIN D, CHOLECALCIFEROL, PO Take 2,000 mg by mouth daily.      Marland Kitchen zolpidem (AMBIEN) 5 MG tablet Take 1 tablet (5 mg total) by mouth at bedtime as needed.  30 tablet  4   No facility-administered encounter medications on  file as of 09/10/2013.   BP 132/98  Pulse 66  Temp(Src) 98.8 F (37.1 C) (Oral)  Wt 188 lb (85.276 kg)  BMI 29.44 kg/m2  SpO2 92%  Review of Systems  Constitutional: Negative for fever, chills, appetite change, fatigue and unexpected weight change.  HENT: Negative for ear pain, congestion, sore throat, trouble swallowing, neck pain, voice change and sinus pressure.   Eyes: Negative for visual disturbance.  Respiratory: Negative for cough, shortness of breath, wheezing and stridor.   Cardiovascular: Negative for chest pain, palpitations and leg swelling.  Gastrointestinal: Negative for nausea, vomiting, abdominal pain, diarrhea, constipation, blood in stool, abdominal distention and anal bleeding.  Genitourinary: Negative for dysuria and flank pain.  Musculoskeletal: Negative for myalgias, arthralgias and gait problem.  Skin: Negative for color change and  rash.  Neurological: Negative for dizziness and headaches.  Hematological: Negative for adenopathy. Does not bruise/bleed easily.  Psychiatric/Behavioral: Positive for dysphoric mood. Negative for suicidal ideas and sleep disturbance. The patient is not nervous/anxious.        Objective:   Physical Exam  Constitutional: She is oriented to person, place, and time. She appears well-developed and well-nourished. No distress.  HENT:  Head: Normocephalic and atraumatic.  Right Ear: External ear normal.  Left Ear: External ear normal.  Nose: Nose normal.  Mouth/Throat: Oropharynx is clear and moist. No oropharyngeal exudate.  Eyes: Conjunctivae are normal. Pupils are equal, round, and reactive to light. Right eye exhibits no discharge. Left eye exhibits no discharge. No scleral icterus.  Neck: Normal range of motion. Neck supple. No tracheal deviation present. No thyromegaly present.  Cardiovascular: Normal rate, regular rhythm, normal heart sounds and intact distal pulses.  Exam reveals no gallop and no friction rub.   No murmur heard. Pulmonary/Chest: Effort normal and breath sounds normal. No accessory muscle usage. Not tachypneic. No respiratory distress. She has no decreased breath sounds. She has no wheezes. She has no rhonchi. She has no rales. She exhibits no tenderness.  Musculoskeletal: Normal range of motion. She exhibits no edema and no tenderness.  Lymphadenopathy:    She has no cervical adenopathy.  Neurological: She is alert and oriented to person, place, and time. No cranial nerve deficit. She exhibits normal muscle tone. Coordination normal.  Skin: Skin is warm and dry. No rash noted. She is not diaphoretic. No erythema. No pallor.  Psychiatric: She has a normal mood and affect. Her behavior is normal. Judgment and thought content normal.          Assessment & Plan:

## 2013-09-10 NOTE — Assessment & Plan Note (Signed)
BP Readings from Last 3 Encounters:  09/10/13 132/98  08/24/13 126/74  07/02/13 160/98   BP initially elevated today, then improved on recheck. Will monitor closely at home and call if consistently >140/90. Continue current medicaiton.

## 2013-09-10 NOTE — Assessment & Plan Note (Signed)
Symptomatic with fatigue.Will check thyroid function with labs today.

## 2013-09-10 NOTE — Assessment & Plan Note (Signed)
Tension headache. Symptoms resolved with use of Zanaflex. Will continue to use prn.

## 2013-09-11 LAB — T4, FREE: Free T4: 1.34 ng/dL (ref 0.80–1.80)

## 2013-09-11 LAB — VITAMIN B12: Vitamin B-12: 616 pg/mL (ref 211–911)

## 2013-09-11 LAB — FERRITIN: Ferritin: 77 ng/mL (ref 10–291)

## 2013-09-11 LAB — TSH: TSH: 1.038 u[IU]/mL (ref 0.350–4.500)

## 2013-09-15 ENCOUNTER — Other Ambulatory Visit: Payer: Self-pay | Admitting: *Deleted

## 2013-09-15 NOTE — Telephone Encounter (Signed)
Last visit 09/10/13, ok to refill?

## 2013-09-16 ENCOUNTER — Telehealth: Payer: Self-pay | Admitting: Internal Medicine

## 2013-09-16 MED ORDER — ZOLPIDEM TARTRATE 5 MG PO TABS: 5.0000 mg | ORAL_TABLET | Freq: Every evening | ORAL | Status: DC | PRN

## 2013-09-16 NOTE — Telephone Encounter (Signed)
Spoke with pharmacist at [facility], they have received her medication and it has already been shipped out to her. Left this information on patient home phone number.

## 2013-09-16 NOTE — Telephone Encounter (Signed)
Ok to refill 

## 2013-09-16 NOTE — Telephone Encounter (Signed)
States her pharmacy says they have not received the prescription for Ambien.

## 2013-09-16 NOTE — Telephone Encounter (Signed)
Rx faxed to pharmacy  

## 2013-09-17 ENCOUNTER — Ambulatory Visit
Admission: RE | Admit: 2013-09-17 | Discharge: 2013-09-17 | Disposition: A | Discharge status: Home / Self Care | Payer: Medicare Other | Source: Ambulatory Visit

## 2013-09-17 DIAGNOSIS — Z853 Personal history of malignant neoplasm of breast: Secondary | ICD-10-CM | POA: Diagnosis not present

## 2013-09-17 DIAGNOSIS — Z1231 Encounter for screening mammogram for malignant neoplasm of breast: Secondary | ICD-10-CM

## 2013-09-17 NOTE — Telephone Encounter (Signed)
Prescription was resent on 10/9 by [facility], patient should be instructed to contact her pharmacy to avoid delays in getting her medication.

## 2013-09-22 ENCOUNTER — Ambulatory Visit: Payer: Medicare Other | Admitting: Internal Medicine

## 2013-10-06 ENCOUNTER — Telehealth: Payer: Self-pay | Admitting: *Deleted

## 2013-10-06 NOTE — Telephone Encounter (Signed)
Patient left a message on voicemail yesterday afternoon requesting a return phone call. Called patient back today, she state she is still feeling sluggish, not feeling well. Her husband looked up some information on the internet about thyroid testing, informed patient she did have TSH and T4 levels checked and they were within normal limits. Patient just does not understand why she is feeling this way. Also would like to know if her SHBG levels need to be checked. Please advise, her husband has been looking up all this information about thyroids

## 2013-10-06 NOTE — Telephone Encounter (Signed)
We should make an appointment to evaluate her.

## 2013-10-07 NOTE — Telephone Encounter (Signed)
Left message with patient husband for her to call the office back to schedule an appointment. Has an appointment already scheduled for 11/7 but if needs something sooner then call to reschedule.

## 2013-10-14 ENCOUNTER — Encounter: Payer: Self-pay | Admitting: *Deleted

## 2013-10-15 ENCOUNTER — Encounter: Payer: Self-pay | Admitting: Internal Medicine

## 2013-10-15 ENCOUNTER — Ambulatory Visit (INDEPENDENT_AMBULATORY_CARE_PROVIDER_SITE_OTHER): Payer: Medicare Other | Admitting: Internal Medicine

## 2013-10-15 VITALS — BP 144/82 | HR 59 | Temp 98.4°F | Wt 193.0 lb

## 2013-10-15 DIAGNOSIS — G4700 Insomnia, unspecified: Secondary | ICD-10-CM | POA: Diagnosis not present

## 2013-10-15 DIAGNOSIS — F329 Major depressive disorder, single episode, unspecified: Secondary | ICD-10-CM | POA: Diagnosis not present

## 2013-10-15 NOTE — Assessment & Plan Note (Signed)
Symptoms gradually improving. Will continue current medications. Encouraged her to seek activities outside her home, such as volunteer work.

## 2013-10-15 NOTE — Progress Notes (Signed)
Subjective:    Patient ID: Cassidy Ortiz, female    DOB: 1941-07-23, 72 y.o.   MRN: [id number]  HPI 72YO female with h/o hypertension, diastolic CHF, cerebral aneurysm, and recent exacerbation of depression presents for follow up. She is with her husband at the visit. They report she has been more interactive, participating more in activities outside the home. Less symptoms of depressed mood. Continues to have some problems staying asleep at night. Occasionally will use Ambien 5mg  with improvement. No daytime drowsiness noted with this medication.  Outpatient Encounter Prescriptions as of 10/15/2013  Medication Sig  . ALPRAZolam (XANAX) 0.25 MG tablet Take 1 tablet (0.25 mg total) by mouth 3 (three) times daily as needed for anxiety.  Marland Kitchen aspirin 81 MG tablet Take 81 mg by mouth daily. Takes 2 tablets daily  . atorvastatin (LIPITOR) 40 MG tablet Take 1 tablet (40 mg total) by mouth daily.  . Calcium 150 MG TABS 1,200 mg. Take 1,200 mg by mouth daily.  . Calcium Carbonate-Vit D-Min (CALCIUM 1200 PO) Take 1 tablet by mouth daily.  . carvedilol (COREG) 3.125 MG tablet Take 1 tablet (3.125 mg total) by mouth 2 (two) times daily.  . cloNIDine (CATAPRES) 0.1 MG tablet Take 1 tablet (0.1 mg total) by mouth 3 (three) times daily.  Marland Kitchen esomeprazole (NEXIUM) 40 MG capsule Take 1 capsule (40 mg total) by mouth daily before breakfast. Take 40 mg by mouth daily.  . furosemide (LASIX) 20 MG tablet Take 20 mg by mouth as needed.   . hydrALAZINE (APRESOLINE) 100 MG tablet Take 1 tablet (100 mg total) by mouth 3 (three) times daily.  Marland Kitchen levothyroxine (SYNTHROID) 150 MCG tablet TAKE 1 TABLET DAILY  . mirtazapine (REMERON) 15 MG tablet Take 1 tablet (15 mg total) by mouth at bedtime.  . Multiple Vitamin (MULTIVITAMIN) tablet Take 1 tablet by mouth daily.    Marland Kitchen rOPINIRole (REQUIP) 1 MG tablet Take 1 mg by mouth daily as needed.   . tizanidine (ZANAFLEX) 2 MG capsule Take 1-2 capsules (2-4 mg total) by mouth 3 (three)  times daily as needed for muscle spasms.  . traZODone (DESYREL) 150 MG tablet Take 1 tablet (150 mg total) by mouth at bedtime.  . Vilazodone HCl (VIIBRYD) 40 MG TABS Take 1 tablet (40 mg total) by mouth daily.  Marland Kitchen VITAMIN D, CHOLECALCIFEROL, PO Take 2,000 mg by mouth daily.  Marland Kitchen zolpidem (AMBIEN) 5 MG tablet Take 1 tablet (5 mg total) by mouth at bedtime as needed.   BP 144/82  Pulse 59  Temp(Src) 98.4 F (36.9 C) (Oral)  Wt 193 lb (87.544 kg)  SpO2 93%  Review of Systems  Constitutional: Negative for fever, chills, appetite change, fatigue and unexpected weight change.  HENT: Negative for congestion, ear pain, sinus pressure, sore throat, trouble swallowing and voice change.   Eyes: Negative for visual disturbance.  Respiratory: Negative for cough, shortness of breath, wheezing and stridor.   Cardiovascular: Negative for chest pain, palpitations and leg swelling.  Gastrointestinal: Negative for nausea, vomiting, abdominal pain, diarrhea, constipation, blood in stool, abdominal distention and anal bleeding.  Genitourinary: Negative for dysuria and flank pain.  Musculoskeletal: Negative for arthralgias, gait problem, myalgias and neck pain.  Skin: Negative for color change and rash.  Neurological: Negative for dizziness and headaches.  Hematological: Negative for adenopathy. Does not bruise/bleed easily.  Psychiatric/Behavioral: Positive for sleep disturbance and dysphoric mood. Negative for suicidal ideas. The patient is not nervous/anxious.        Objective:  Physical Exam  Constitutional: She is oriented to person, place, and time. She appears well-developed and well-nourished. No distress.  HENT:  Head: Normocephalic and atraumatic.  Right Ear: External ear normal.  Left Ear: External ear normal.  Nose: Nose normal.  Mouth/Throat: Oropharynx is clear and moist. No oropharyngeal exudate.  Eyes: Conjunctivae are normal. Pupils are equal, round, and reactive to light. Right eye  exhibits no discharge. Left eye exhibits no discharge. No scleral icterus.  Neck: Normal range of motion. Neck supple. No tracheal deviation present. No thyromegaly present.  Cardiovascular: Normal rate, regular rhythm, normal heart sounds and intact distal pulses.  Exam reveals no gallop and no friction rub.   No murmur heard. Pulmonary/Chest: Effort normal and breath sounds normal. No accessory muscle usage. Not tachypneic. No respiratory distress. She has no decreased breath sounds. She has no wheezes. She has no rhonchi. She has no rales. She exhibits no tenderness.  Musculoskeletal: Normal range of motion. She exhibits no edema and no tenderness.  Lymphadenopathy:    She has no cervical adenopathy.  Neurological: She is alert and oriented to person, place, and time. No cranial nerve deficit. She exhibits normal muscle tone. Coordination normal.  Skin: Skin is warm and dry. No rash noted. She is not diaphoretic. No erythema. No pallor.  Psychiatric: She has a normal mood and affect. Her behavior is normal. Judgment and thought content normal.          Assessment & Plan:

## 2013-10-15 NOTE — Progress Notes (Signed)
Pre-visit discussion using our clinic review tool. No additional management support is needed unless otherwise documented below in the visit note.  

## 2013-10-15 NOTE — Assessment & Plan Note (Signed)
Symptoms relatively well controlled with current medications. Only uses Ambien on rare occasion. Discussed good sleep hygiene. Will continue to monitor.

## 2013-10-21 ENCOUNTER — Other Ambulatory Visit (HOSPITAL_BASED_OUTPATIENT_CLINIC_OR_DEPARTMENT_OTHER): Payer: Medicare Other | Admitting: Lab

## 2013-10-21 ENCOUNTER — Telehealth: Payer: Self-pay | Admitting: Hematology & Oncology

## 2013-10-21 ENCOUNTER — Ambulatory Visit (HOSPITAL_BASED_OUTPATIENT_CLINIC_OR_DEPARTMENT_OTHER): Payer: Medicare Other | Admitting: Hematology & Oncology

## 2013-10-21 VITALS — BP 176/84 | HR 53 | Temp 97.5°F | Resp 14 | Ht 67.0 in | Wt 192.0 lb

## 2013-10-21 DIAGNOSIS — K921 Melena: Secondary | ICD-10-CM | POA: Diagnosis not present

## 2013-10-21 DIAGNOSIS — C50911 Malignant neoplasm of unspecified site of right female breast: Secondary | ICD-10-CM

## 2013-10-21 DIAGNOSIS — I671 Cerebral aneurysm, nonruptured: Secondary | ICD-10-CM | POA: Diagnosis not present

## 2013-10-21 DIAGNOSIS — R609 Edema, unspecified: Secondary | ICD-10-CM | POA: Diagnosis not present

## 2013-10-21 DIAGNOSIS — K219 Gastro-esophageal reflux disease without esophagitis: Secondary | ICD-10-CM | POA: Diagnosis not present

## 2013-10-21 DIAGNOSIS — D509 Iron deficiency anemia, unspecified: Secondary | ICD-10-CM

## 2013-10-21 DIAGNOSIS — Z85528 Personal history of other malignant neoplasm of kidney: Secondary | ICD-10-CM | POA: Diagnosis not present

## 2013-10-21 DIAGNOSIS — M810 Age-related osteoporosis without current pathological fracture: Secondary | ICD-10-CM

## 2013-10-21 DIAGNOSIS — E079 Disorder of thyroid, unspecified: Secondary | ICD-10-CM | POA: Diagnosis not present

## 2013-10-21 DIAGNOSIS — R011 Cardiac murmur, unspecified: Secondary | ICD-10-CM | POA: Diagnosis not present

## 2013-10-21 DIAGNOSIS — C50919 Malignant neoplasm of unspecified site of unspecified female breast: Secondary | ICD-10-CM | POA: Diagnosis not present

## 2013-10-21 DIAGNOSIS — I5031 Acute diastolic (congestive) heart failure: Secondary | ICD-10-CM | POA: Diagnosis not present

## 2013-10-21 DIAGNOSIS — D649 Anemia, unspecified: Secondary | ICD-10-CM

## 2013-10-21 DIAGNOSIS — R0609 Other forms of dyspnea: Secondary | ICD-10-CM | POA: Diagnosis not present

## 2013-10-21 DIAGNOSIS — M799 Soft tissue disorder, unspecified: Secondary | ICD-10-CM | POA: Diagnosis not present

## 2013-10-21 DIAGNOSIS — I509 Heart failure, unspecified: Secondary | ICD-10-CM | POA: Diagnosis not present

## 2013-10-21 DIAGNOSIS — Q248 Other specified congenital malformations of heart: Secondary | ICD-10-CM | POA: Diagnosis not present

## 2013-10-21 DIAGNOSIS — Z853 Personal history of malignant neoplasm of breast: Secondary | ICD-10-CM | POA: Diagnosis not present

## 2013-10-21 DIAGNOSIS — C649 Malignant neoplasm of unspecified kidney, except renal pelvis: Secondary | ICD-10-CM | POA: Diagnosis not present

## 2013-10-21 DIAGNOSIS — E039 Hypothyroidism, unspecified: Secondary | ICD-10-CM | POA: Diagnosis not present

## 2013-10-21 DIAGNOSIS — F329 Major depressive disorder, single episode, unspecified: Secondary | ICD-10-CM | POA: Diagnosis not present

## 2013-10-21 DIAGNOSIS — E559 Vitamin D deficiency, unspecified: Secondary | ICD-10-CM | POA: Diagnosis not present

## 2013-10-21 DIAGNOSIS — I10 Essential (primary) hypertension: Secondary | ICD-10-CM | POA: Diagnosis not present

## 2013-10-21 DIAGNOSIS — Z982 Presence of cerebrospinal fluid drainage device: Secondary | ICD-10-CM | POA: Diagnosis not present

## 2013-10-21 DIAGNOSIS — I421 Obstructive hypertrophic cardiomyopathy: Secondary | ICD-10-CM | POA: Diagnosis not present

## 2013-10-21 DIAGNOSIS — R0602 Shortness of breath: Secondary | ICD-10-CM | POA: Diagnosis not present

## 2013-10-21 DIAGNOSIS — K2970 Gastritis, unspecified, without bleeding: Secondary | ICD-10-CM | POA: Diagnosis not present

## 2013-10-21 LAB — CBC WITH DIFFERENTIAL (CANCER CENTER ONLY)
BASO#: 0 10*3/uL (ref 0.0–0.2)
EOS%: 2.8 % (ref 0.0–7.0)
Eosinophils Absolute: 0.2 10*3/uL (ref 0.0–0.5)
HCT: 39.9 % (ref 34.8–46.6)
HGB: 13.5 g/dL (ref 11.6–15.9)
LYMPH%: 22 % (ref 14.0–48.0)
MCH: 30.8 pg (ref 26.0–34.0)
MCHC: 33.8 g/dL (ref 32.0–36.0)
MCV: 91 fL (ref 81–101)
MONO#: 0.6 10*3/uL (ref 0.1–0.9)
MONO%: 9.5 % (ref 0.0–13.0)
NEUT%: 65.2 % (ref 39.6–80.0)
Platelets: 174 10*3/uL (ref 145–400)
RBC: 4.38 10*6/uL (ref 3.70–5.32)
RDW: 14.3 % (ref 11.1–15.7)
WBC: 6.1 10*3/uL (ref 3.9–10.0)

## 2013-10-21 LAB — CMP (CANCER CENTER ONLY)
ALT(SGPT): 19 U/L (ref 10–47)
AST: 24 U/L (ref 11–38)
CO2: 30 mEq/L (ref 18–33)
Calcium: 9.5 mg/dL (ref 8.0–10.3)
Chloride: 103 mEq/L (ref 98–108)
Creat: 1 mg/dl (ref 0.6–1.2)
Potassium: 3.8 mEq/L (ref 3.3–4.7)
Sodium: 142 mEq/L (ref 128–145)
Total Bilirubin: 0.6 mg/dl (ref 0.20–1.60)
Total Protein: 7 g/dL (ref 6.4–8.1)

## 2013-10-21 LAB — IRON AND TIBC CHCC
%SAT: 27 % (ref 21–57)
Iron: 65 ug/dL (ref 41–142)
UIBC: 179 ug/dL (ref 120–384)

## 2013-10-21 LAB — FERRITIN CHCC: Ferritin: 72 ng/ml (ref 9–269)

## 2013-10-21 NOTE — Progress Notes (Signed)
This office note has been dictated.

## 2013-10-21 NOTE — Telephone Encounter (Signed)
Mailed may schedule

## 2013-10-22 ENCOUNTER — Encounter: Payer: Self-pay | Admitting: *Deleted

## 2013-10-28 DIAGNOSIS — H35039 Hypertensive retinopathy, unspecified eye: Secondary | ICD-10-CM | POA: Diagnosis not present

## 2013-10-30 NOTE — Progress Notes (Signed)
CC:   Ronette Deter, MD  DIAGNOSES: 1. 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 2. 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 3. Intermittent iron-deficiency anemia.  CURRENT THERAPY:  Observation.  INTERIM HISTORY:  Ms. Brassell comes in for followup every 6 months.  She and her husband have had a tough time this summer.  Unfortunately, there were a lot of issues with their kids.  This is really taking a toll on Ms. Verhoeven.  She has little more depression now.  I think she is trying to fight this.  She is on Viibryd and Remeron.  This hopefully will be able to help her out.  She and her husband are going down to a son's house in [place] for Thanksgiving.  They are looking forward to this.  Overall, physically, she seems to be doing okay.  She is not exercising much.  She is gaining a little weight.  She does have hypothyroidism.  I am sure this is being monitored by Dr. Gilford Rile.  I think her last TSH that I have on her from a month ago was 1.038.  Her last iron studies done back in October showed a ferritin of 77.  Her last mammogram that we have on her was also done in October.  The mammogram did not show any evidence of suspicious microcalcifications.  PHYSICAL EXAMINATION:  General:  This is a well-developed, well- nourished, white female, who is somewhat overweight.  Vital signs: Temperature 97.5, pulse 53, respiratory rate 14, blood pressure 176/84, weight is 192 pounds.  Head and neck:  Normocephalic, atraumatic skull. There are no ocular or oral lesions.  There are no palpable cervical or supraclavicular lymph nodes.  Lungs:  Clear bilaterally.  Cardiac: Regular rate and rhythm with a normal S1, S2.  There are no murmurs, rubs, or bruits.  Breasts:  Left breast with no masses, edema, or erythema.  There is no left axillary adenopathy.  Right breast is contracted from surgery and radiation.  She has a well-healed lumpectomy at about  the 12 o'clock position.  There is a slight nipple inversion. There is slight firmness at the lumpectomy site.  No distinct masses noted in the right breast.  There is no right axillary adenopathy. Abdomen:  Soft.  Mildly obese.  She has decent bowel sounds.  There is no fluid wave.  There is no palpable hepatosplenomegaly.  She has a well- healed nephrectomy scar for the right kidney.  Extremities:  No clubbing, cyanosis, or edema.  She may have some osteoarthritic changes in her joints.  Neurological:  No focal neurological deficits.  Skin: No rashes, ecchymosis, or petechia.  LABORATORY STUDIES:  White cell count is 6.1, hemoglobin 13.5, hematocrit 39.9, platelet count 174.  Sodium 143, potassium 3.8, BUN 26, creatinine 1.0.  Calcium 9.5 with an albumin of 3.5.  IMPRESSION:  Ms. Morcom is a very charming, 72 year old, white female. She has a remote history of stage I ductal carcinoma of the right breast.  This probably was over 12 years ago now.  I do not feel that this will be an issue with respect to recurrence.  I also believe that her renal cell carcinoma also has a very low chance of recurring.  I feel that a lot of the issues now are more psychological than anything else.  The patient continues on a bunch of medications.  Of note, she does take aspirin, which is very important for her from my  point of view.  We will plan to get her back to see [place] in another 6 months.  We will check her iron studies, as always.  I spent a good 45 minutes with them today talking about the issues that they are having with their family.  She has a strong faith and I just told them to be strong and know where their strength comes from.    ______________________________ Volanda Napoleon, M.D. PRE/MEDQ  D:  10/21/2013  T:  10/30/2013  Job:  [id number]

## 2013-12-24 ENCOUNTER — Other Ambulatory Visit: Payer: Self-pay | Admitting: Internal Medicine

## 2013-12-24 ENCOUNTER — Other Ambulatory Visit: Payer: Self-pay | Admitting: Cardiovascular Disease

## 2013-12-24 ENCOUNTER — Other Ambulatory Visit: Payer: Self-pay | Admitting: *Deleted

## 2013-12-24 MED ORDER — CLONIDINE HCL 0.1 MG PO TABS: 0.1000 mg | ORAL_TABLET | Freq: Three times a day (TID) | ORAL | Status: DC

## 2013-12-24 NOTE — Telephone Encounter (Signed)
Requested Prescriptions   Signed Prescriptions Disp Refills  . cloNIDine (CATAPRES) 0.1 MG tablet 180 tablet 3    Sig: Take 1 tablet (0.1 mg total) by mouth 3 (three) times daily.    Authorizing Provider: Minna Merritts    Ordering User: Britt Bottom

## 2013-12-29 ENCOUNTER — Other Ambulatory Visit: Payer: Self-pay

## 2013-12-29 MED ORDER — CLONIDINE HCL 0.1 MG PO TABS: 0.1000 mg | ORAL_TABLET | Freq: Three times a day (TID) | ORAL | Status: DC

## 2013-12-29 NOTE — Telephone Encounter (Signed)
LMOM to have patient call regarding a refill and need to schedule a follow up with Dr. Rockey Situ; past due.

## 2014-01-05 ENCOUNTER — Ambulatory Visit (INDEPENDENT_AMBULATORY_CARE_PROVIDER_SITE_OTHER): Payer: Medicare Other | Admitting: Cardiovascular Disease

## 2014-01-05 ENCOUNTER — Encounter: Payer: Self-pay | Admitting: Cardiovascular Disease

## 2014-01-05 VITALS — BP 150/80 | HR 55 | Ht 67.0 in | Wt 205.5 lb

## 2014-01-05 DIAGNOSIS — I5031 Acute diastolic (congestive) heart failure: Secondary | ICD-10-CM

## 2014-01-05 DIAGNOSIS — I10 Essential (primary) hypertension: Secondary | ICD-10-CM | POA: Diagnosis not present

## 2014-01-05 DIAGNOSIS — F3289 Other specified depressive episodes: Secondary | ICD-10-CM | POA: Diagnosis not present

## 2014-01-05 DIAGNOSIS — E785 Hyperlipidemia, unspecified: Secondary | ICD-10-CM | POA: Insufficient documentation

## 2014-01-05 DIAGNOSIS — F329 Major depressive disorder, single episode, unspecified: Secondary | ICD-10-CM

## 2014-01-05 DIAGNOSIS — I509 Heart failure, unspecified: Secondary | ICD-10-CM

## 2014-01-05 DIAGNOSIS — R011 Cardiac murmur, unspecified: Secondary | ICD-10-CM

## 2014-01-05 NOTE — Assessment & Plan Note (Signed)
Low-grade aortic valve murmur. No further workup at this time

## 2014-01-05 NOTE — Assessment & Plan Note (Signed)
She volunteers that she is depressed on today's visit. Encouraged her to increase her daily exercise

## 2014-01-05 NOTE — Progress Notes (Signed)
Patient ID: Cassidy Ortiz, female    DOB: 01/01/1941, 73 y.o.   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 She initially presented with lower extremity edema.  Previous  elevated creatinine of 1.5. Edema improved by holding diltiazem  Echocardiogram showed severe LVH, mild outflow tract gradient. No mention of elevated right ventricular systolic pressures. Previous  Problems with anemia before requiring IM iron, now resolved  Previous EGD showed  ulcer, H. Pylori negative.  Earlier in 2013, we discontinued the diltiazem and Her edema resolved.  She reports that in followup today, she is depressed. Recently started on Remeron for sleep over the past several months. Uncertain if this is helping but sleep is okay. She's not doing any exercise. Very sedentary. Feels her blood pressure is doing well but is not checking any numbers. Weight continues to trend upwards  previous assessment/evaluation at [facility] for history of aneurysms and shunt (leak?) showed that shunt is not working but it is not a problem. No recent carotid ultrasound  EKG shows normal sinus rhythm with rate 55 beats per minute with no significant ST or T wave changes  Outpatient Encounter Prescriptions as of 01/05/2014  Medication Sig  . ALPRAZolam (XANAX) 0.25 MG tablet Take 1 tablet (0.25 mg total) by mouth 3 (three) times daily as needed for anxiety.  Marland Kitchen aspirin 81 MG tablet Take 81 mg by mouth daily. Takes 2 tablets daily  . atorvastatin (LIPITOR) 40  MG tablet Take 1 tablet (40 mg total) by mouth daily.  . Calcium Carbonate-Vit D-Min (CALCIUM 1200 PO) Take 1 tablet by mouth daily.  . carvedilol (COREG) 3.125 MG tablet Take 1 tablet (3.125 mg total) by mouth 2 (two) times daily.  . cloNIDine (CATAPRES) 0.1 MG tablet TAKE 1 TABLET 3 TIMES A DAY  . cloNIDine (CATAPRES) 0.1 MG tablet Take 1 tablet (0.1 mg total) by mouth 3 (three) times daily.  Marland Kitchen esomeprazole (NEXIUM) 40 MG capsule Take 1 capsule (40 mg total) by mouth daily before breakfast. Take 40 mg by mouth daily.  . furosemide (LASIX) 20 MG tablet Take 20 mg by mouth as needed.   . hydrALAZINE (APRESOLINE) 100 MG tablet Take 1 tablet (100 mg total) by mouth 3 (three) times daily.  Marland Kitchen levothyroxine (SYNTHROID) 150 MCG tablet TAKE 1 TABLET DAILY  . mirtazapine (REMERON) 15 MG tablet TAKE 1 TABLET (15 MG TOTAL) BY MOUTH AT BEDTIME.  . Multiple Vitamin (MULTIVITAMIN) tablet Take 1 tablet by mouth daily.    Marland Kitchen rOPINIRole (REQUIP) 1 MG tablet Take 1 mg by mouth daily as needed.   . tizanidine (ZANAFLEX) 2 MG capsule Take 1-2 capsules (2-4 mg total) by mouth 3 (three) times daily as needed for muscle spasms.  . traZODone (DESYREL) 150 MG tablet Take 1 tablet (150 mg total) by mouth at bedtime.  . Vilazodone HCl (VIIBRYD) 40 MG TABS Take 1 tablet (40 mg total) by mouth daily.  Marland Kitchen zolpidem (AMBIEN) 5 MG tablet Take 1 tablet (5 mg total) by mouth at bedtime as needed.    Review  of Systems  Constitutional: Positive for unexpected weight change.  HENT: Negative.   Eyes: Negative.   Respiratory: Negative.   Cardiovascular: Negative.           Gastrointestinal: Negative.   Endocrine: Negative.   Musculoskeletal: Negative.   Skin: Negative.   Allergic/Immunologic: Negative.   Neurological: Negative.   Hematological: Negative.   Psychiatric/Behavioral: Negative.   All other systems reviewed and are negative.   BP 150/80  Pulse 55  Ht 5\' 7"  (1.702 m)  Wt 205 lb 8 oz (93.214 kg)  BMI 32.18  kg/m2  Physical Exam  Nursing note and vitals reviewed. Constitutional: She is oriented to person, place, and time. She appears well-developed and well-nourished.  Obese  HENT:  Head: Normocephalic.  Nose: Nose normal.  Mouth/Throat: Oropharynx is clear and moist.  Eyes: Conjunctivae are normal. Pupils are equal, round, and reactive to light.  Neck: Normal range of motion. Neck supple. No JVD present.  Cardiovascular: Normal rate, regular rhythm, S1 normal, S2 normal and intact distal pulses.  Exam reveals no gallop and no friction rub.   Murmur heard.  Crescendo systolic murmur is present with a grade of 2/6  Pulmonary/Chest: Effort normal and breath sounds normal. No respiratory distress. She has no wheezes. She has no rales. She exhibits no tenderness.  Abdominal: Soft. Bowel sounds are normal. She exhibits no distension. There is no tenderness.  Musculoskeletal: Normal range of motion. She exhibits no edema and no tenderness.  Lymphadenopathy:    She has no cervical adenopathy.  Neurological: She is alert and oriented to person, place, and time. Coordination normal.  Skin: Skin is warm and dry. No rash noted. No erythema.  Psychiatric: She has a normal mood and affect. Her behavior is normal. Judgment and thought content normal.    Assessment and Plan

## 2014-01-05 NOTE — Assessment & Plan Note (Addendum)
She appears relatively euvolemic. Husband gives her Lasix as needed for leg swelling. No further medication changes made. Suggested he closely monitor her weight for any fluid retention.

## 2014-01-05 NOTE — Patient Instructions (Addendum)
You are doing well. No medication changes were made.  We will check cholesterol anel when you have blood work with Dr. Gilford Rile.  Please call us if you have new issues that need to be addressed before your next appt.  Your physician wants you to follow-up in: 12 months.  You will receive a reminder letter in the mail two months in advance. If you don't receive a letter, please call our office to schedule the follow-up appointment.

## 2014-01-05 NOTE — Assessment & Plan Note (Signed)
Blood pressure is well controlled on today's visit. No changes made to the medications. 

## 2014-01-05 NOTE — Assessment & Plan Note (Signed)
We have encouraged continued exercise, careful diet management in an effort to lose weight. 

## 2014-01-05 NOTE — Assessment & Plan Note (Signed)
We have added a lipid panel to be done on her next lab draw with Dr. walker

## 2014-01-20 ENCOUNTER — Other Ambulatory Visit: Payer: Self-pay | Admitting: Internal Medicine

## 2014-01-28 ENCOUNTER — Encounter: Payer: Self-pay | Admitting: Internal Medicine

## 2014-01-28 ENCOUNTER — Ambulatory Visit (INDEPENDENT_AMBULATORY_CARE_PROVIDER_SITE_OTHER): Payer: Medicare Other | Admitting: Internal Medicine

## 2014-01-28 VITALS — BP 190/100 | HR 59 | Temp 98.0°F | Wt 207.0 lb

## 2014-01-28 DIAGNOSIS — E039 Hypothyroidism, unspecified: Secondary | ICD-10-CM | POA: Diagnosis not present

## 2014-01-28 DIAGNOSIS — E785 Hyperlipidemia, unspecified: Secondary | ICD-10-CM | POA: Diagnosis not present

## 2014-01-28 DIAGNOSIS — I10 Essential (primary) hypertension: Secondary | ICD-10-CM | POA: Diagnosis not present

## 2014-01-28 DIAGNOSIS — F329 Major depressive disorder, single episode, unspecified: Secondary | ICD-10-CM | POA: Diagnosis not present

## 2014-01-28 DIAGNOSIS — F3289 Other specified depressive episodes: Secondary | ICD-10-CM

## 2014-01-28 LAB — COMPREHENSIVE METABOLIC PANEL WITH GFR
ALT: 20 U/L (ref 0–35)
AST: 21 U/L (ref 0–37)
Albumin: 3.7 g/dL (ref 3.5–5.2)
Alkaline Phosphatase: 77 U/L (ref 39–117)
BUN: 29 mg/dL — ABNORMAL HIGH (ref 6–23)
CO2: 28 meq/L (ref 19–32)
Calcium: 9.7 mg/dL (ref 8.4–10.5)
Chloride: 106 meq/L (ref 96–112)
Creatinine, Ser: 1.4 mg/dL — ABNORMAL HIGH (ref 0.4–1.2)
GFR: 39.4 mL/min — ABNORMAL LOW
Glucose, Bld: 95 mg/dL (ref 70–99)
Potassium: 4.4 meq/L (ref 3.5–5.1)
Sodium: 143 meq/L (ref 135–145)
Total Bilirubin: 0.5 mg/dL (ref 0.3–1.2)
Total Protein: 6.6 g/dL (ref 6.0–8.3)

## 2014-01-28 LAB — TSH: TSH: 6.82 u[IU]/mL — ABNORMAL HIGH (ref 0.35–5.50)

## 2014-01-28 MED ORDER — AMLODIPINE BESYLATE 5 MG PO TABS: 5.0000 mg | ORAL_TABLET | Freq: Every day | ORAL | Status: DC

## 2014-01-28 MED ORDER — ATORVASTATIN CALCIUM 40 MG PO TABS: 40.0000 mg | ORAL_TABLET | Freq: Every day | ORAL | Status: DC

## 2014-01-28 MED ORDER — LEVOTHYROXINE SODIUM 175 MCG PO TABS: ORAL_TABLET | ORAL | Status: DC

## 2014-01-28 NOTE — Progress Notes (Signed)
Pre visit review using our clinic review tool, if applicable. No additional management support is needed unless otherwise documented below in the visit note. 

## 2014-01-28 NOTE — Addendum Note (Signed)
Addended by: Ronette Deter A on: 01/28/2014 04:44 PM   Modules accepted: Orders

## 2014-01-28 NOTE — Patient Instructions (Signed)
Start Amlodipine 5mg  daily.  Please check blood pressure at home and email or call with readings.  Follow up 1 week for BP recheck.

## 2014-01-28 NOTE — Assessment & Plan Note (Addendum)
Symptomatically doing well. Will check TSH with labs today.  Addendum: TSH elevated on labs today. Will increase Levothyroxine to 122mcg daily. Repeat TSH in 6 weeks.

## 2014-01-28 NOTE — Progress Notes (Signed)
Subjective:    Patient ID: Cassidy Ortiz, female    DOB: 1941-06-26, 73 y.o.   MRN: [id number]  HPI 72YO female presents for follow up. Tough time for her family. Close friend died yesterday of AAA rupture. She has been helping provide care for the family. Her husband notes worsening symptoms of depression in January, but reports this is recently improved. She denies any recent headache, chest pain, palpitations. Compliant with medications.    Review of Systems  Constitutional: Negative for fever, chills, appetite change, fatigue and unexpected weight change.  HENT: Negative for congestion, ear pain, sinus pressure, sore throat, trouble swallowing and voice change.   Eyes: Negative for visual disturbance.  Respiratory: Negative for cough, shortness of breath, wheezing and stridor.   Cardiovascular: Negative for chest pain, palpitations and leg swelling.  Gastrointestinal: Negative for nausea, vomiting, abdominal pain, diarrhea, constipation, blood in stool, abdominal distention and anal bleeding.  Genitourinary: Negative for dysuria and flank pain.  Musculoskeletal: Negative for arthralgias, gait problem, myalgias and neck pain.  Skin: Negative for color change and rash.  Neurological: Negative for dizziness and headaches.  Hematological: Negative for adenopathy. Does not bruise/bleed easily.  Psychiatric/Behavioral: Positive for dysphoric mood. Negative for suicidal ideas and sleep disturbance. The patient is not nervous/anxious.        Objective:    BP 190/100  Pulse 59  Temp(Src) 98 F (36.7 C) (Oral)  Wt 207 lb (93.895 kg)  SpO2 95% Physical Exam  Constitutional: She is oriented to person, place, and time. She appears well-developed and well-nourished. No distress.  HENT:  Head: Normocephalic and atraumatic.  Right Ear: External ear normal.  Left Ear: External ear normal.  Nose: Nose normal.  Mouth/Throat: Oropharynx is clear and moist. No oropharyngeal exudate.  Eyes:  Conjunctivae are normal. Pupils are equal, round, and reactive to light. Right eye exhibits no discharge. Left eye exhibits no discharge. No scleral icterus.  Neck: Normal range of motion. Neck supple. No tracheal deviation present. No thyromegaly present.  Cardiovascular: Normal rate, regular rhythm, normal heart sounds and intact distal pulses.  Exam reveals no gallop and no friction rub.   No murmur heard. Pulmonary/Chest: Effort normal and breath sounds normal. No accessory muscle usage. Not tachypneic. No respiratory distress. She has no decreased breath sounds. She has no wheezes. She has no rhonchi. She has no rales. She exhibits no tenderness.  Musculoskeletal: Normal range of motion. She exhibits no edema and no tenderness.  Lymphadenopathy:    She has no cervical adenopathy.  Neurological: She is alert and oriented to person, place, and time. No cranial nerve deficit. She exhibits normal muscle tone. Coordination normal.  Skin: Skin is warm and dry. No rash noted. She is not diaphoretic. No erythema. No pallor.  Psychiatric: She has a normal mood and affect. Her behavior is normal. Judgment and thought content normal.          Assessment & Plan:   Problem List Items Addressed This Visit   DEPRESSION     Symptoms improved compared to earlier this year. Will continue Viibryd. Follow up prn.    Hyperlipidemia     Will check LFTs with labs today. Continue Atorvastatin.    Relevant Medications      amLODIpine (NORVASC) tablet      atorvastatin (LIPITOR) tablet   Hypertension - Primary      BP Readings from Last 3 Encounters:  01/28/14 190/100  01/05/14 150/80  10/21/13 176/84   BP markedly elevated today,  however improved on my repeat exam 158/88. Will start Amlodipine 61m daily. Plan recheck BP in 1 week.    Relevant Orders      Comp Met (CMET)   Hypothyroidism     Symptomatically doing well. Will check TSH with labs today.    Relevant Orders      TSH        Return in about 1 week (around 02/04/2014) for Recheck of Blood Pressure.

## 2014-01-28 NOTE — Assessment & Plan Note (Signed)
Will check LFTs with labs today. Continue Atorvastatin.

## 2014-01-28 NOTE — Assessment & Plan Note (Signed)
BP Readings from Last 3 Encounters:  01/28/14 190/100  01/05/14 150/80  10/21/13 176/84   BP markedly elevated today, however improved on my repeat exam 158/88. Will start Amlodipine 5mg  daily. Plan recheck BP in 1 week.

## 2014-01-28 NOTE — Assessment & Plan Note (Signed)
Symptoms improved compared to earlier this year. Will continue Viibryd. Follow up prn.

## 2014-01-31 ENCOUNTER — Telehealth: Payer: Self-pay | Admitting: Internal Medicine

## 2014-01-31 NOTE — Telephone Encounter (Signed)
Relevant patient education assigned to patient using Emmi. ° °

## 2014-02-04 ENCOUNTER — Encounter: Payer: Self-pay | Admitting: Internal Medicine

## 2014-02-04 ENCOUNTER — Ambulatory Visit (INDEPENDENT_AMBULATORY_CARE_PROVIDER_SITE_OTHER): Payer: Medicare Other | Admitting: Internal Medicine

## 2014-02-04 VITALS — BP 170/96 | HR 80 | Temp 98.2°F | Wt 207.0 lb

## 2014-02-04 DIAGNOSIS — E039 Hypothyroidism, unspecified: Secondary | ICD-10-CM | POA: Diagnosis not present

## 2014-02-04 DIAGNOSIS — I10 Essential (primary) hypertension: Secondary | ICD-10-CM

## 2014-02-04 MED ORDER — AMLODIPINE BESYLATE 10 MG PO TABS: 10.0000 mg | ORAL_TABLET | Freq: Every day | ORAL | Status: DC

## 2014-02-04 NOTE — Progress Notes (Signed)
Pre visit review using our clinic review tool, if applicable. No additional management support is needed unless otherwise documented below in the visit note. 

## 2014-02-04 NOTE — Progress Notes (Signed)
Subjective:    Patient ID: Cassidy Ortiz, female    DOB: [birth year], 73 y.o.   MRN: [id number]  HPI 72YO female with HTN, hypothyroidism presents for follow up. Started on Amlodipine at last visit. BP at home running 140s/70-80s for the most part. Occasional readings 190s SBP. No headache, chest pain, palpitations. Notes some fatigue.  Also noted at last visit to have elevated TSH on labs >6. Reports compliance with levothyroxine. Dose was increased to 147mg daily. Notes fatigue, temperature intolerance, recent worsening of symptoms of depression, dry skin, constipation.      Review of Systems  Constitutional: Positive for fatigue. Negative for fever, chills, appetite change and unexpected weight change.  HENT: Negative for congestion, ear pain, sinus pressure, sore throat, trouble swallowing and voice change.   Eyes: Negative for visual disturbance.  Respiratory: Negative for cough, shortness of breath, wheezing and stridor.   Cardiovascular: Negative for chest pain, palpitations and leg swelling.  Gastrointestinal: Positive for constipation. Negative for nausea, vomiting, abdominal pain, diarrhea, blood in stool, abdominal distention and anal bleeding.  Endocrine: Positive for heat intolerance.  Genitourinary: Negative for dysuria and flank pain.  Musculoskeletal: Negative for arthralgias, gait problem, myalgias and neck pain.  Skin: Negative for color change and rash.  Neurological: Negative for dizziness and headaches.  Hematological: Negative for adenopathy. Does not bruise/bleed easily.  Psychiatric/Behavioral: Positive for dysphoric mood. Negative for suicidal ideas and sleep disturbance. The patient is not nervous/anxious.        Objective:    BP 170/96  Pulse 80  Temp(Src) 98.2 F (36.8 C) (Oral)  Wt 207 lb (93.895 kg)  SpO2 93% Physical Exam  Constitutional: She is oriented to person, place, and time. She appears well-developed and well-nourished. No distress.    HENT:  Head: Normocephalic and atraumatic.  Right Ear: External ear normal.  Left Ear: External ear normal.  Nose: Nose normal.  Mouth/Throat: Oropharynx is clear and moist. No oropharyngeal exudate.  Eyes: Conjunctivae are normal. Pupils are equal, round, and reactive to light. Right eye exhibits no discharge. Left eye exhibits no discharge. No scleral icterus.  Neck: Normal range of motion. Neck supple. No tracheal deviation present. No thyromegaly present.  Cardiovascular: Normal rate, regular rhythm, normal heart sounds and intact distal pulses.  Exam reveals no gallop and no friction rub.   No murmur heard. Pulmonary/Chest: Effort normal and breath sounds normal. No accessory muscle usage. Not tachypneic. No respiratory distress. She has no decreased breath sounds. She has no wheezes. She has no rhonchi. She has no rales. She exhibits no tenderness.  Musculoskeletal: Normal range of motion. She exhibits no edema and no tenderness.  Lymphadenopathy:    She has no cervical adenopathy.  Neurological: She is alert and oriented to person, place, and time. No cranial nerve deficit. She exhibits normal muscle tone. Coordination normal.  Skin: Skin is warm and dry. No rash noted. She is not diaphoretic. No erythema. No pallor.  Psychiatric: She has a normal mood and affect. Her speech is normal and behavior is normal. Judgment and thought content normal. She expresses no suicidal ideation.          Assessment & Plan:   Problem List Items Addressed This Visit   Hypertension - Primary      BP Readings from Last 3 Encounters:  02/04/14 170/96  01/28/14 190/100  01/05/14 150/80   BP continues to be elevated. Will increase Amlodipine to 175mdaily. Discussed potential increase in Carvedilol to 6.2552mid if  elevated BP persistent. Plan recheck in 2 weeks.    Relevant Medications      amLODIpine (NORVASC) tablet   Other Relevant Orders      Comp Met (CMET)   Hypothyroidism      Lab  Results  Component Value Date   TSH 6.82* 01/28/2014   TSH elevated. Pt symptomatic with fatigue, temp intolerance. Levothyroxine increased to 176mg daily. Reviewed proper way to take this medication. Plan follow up TSH level in 6 weeks.    Relevant Orders      TSH      T4, free      T3, free       Return in about 2 weeks (around 02/18/2014) for Recheck of Blood Pressure.

## 2014-02-04 NOTE — Assessment & Plan Note (Signed)
Lab Results  Component Value Date   TSH 6.82* 01/28/2014   TSH elevated. Pt symptomatic with fatigue, temp intolerance. Levothyroxine increased to 156mcg daily. Reviewed proper way to take this medication. Plan follow up TSH level in 6 weeks.

## 2014-02-04 NOTE — Patient Instructions (Signed)
Increase Amlodipine to 10mg  daily.  Continue Carvedilol 1/2 tablet (3.125mg ) twice daily.  Monitor BP at home several times per week.  Follow up 2-4 weeks.

## 2014-02-04 NOTE — Assessment & Plan Note (Signed)
BP Readings from Last 3 Encounters:  02/04/14 170/96  01/28/14 190/100  01/05/14 150/80   BP continues to be elevated. Will increase Amlodipine to 10mg  daily. Discussed potential increase in Carvedilol to 6.25mg  bid if elevated BP persistent. Plan recheck in 2 weeks.

## 2014-02-22 ENCOUNTER — Encounter: Payer: Self-pay | Admitting: Internal Medicine

## 2014-02-22 ENCOUNTER — Ambulatory Visit (INDEPENDENT_AMBULATORY_CARE_PROVIDER_SITE_OTHER)
Admission: RE | Admit: 2014-02-22 | Discharge: 2014-02-22 | Disposition: A | Discharge status: Home / Self Care | Payer: Medicare Other | Source: Ambulatory Visit | Attending: Internal Medicine | Admitting: Internal Medicine

## 2014-02-22 ENCOUNTER — Ambulatory Visit (INDEPENDENT_AMBULATORY_CARE_PROVIDER_SITE_OTHER): Payer: Medicare Other | Admitting: Internal Medicine

## 2014-02-22 VITALS — BP 130/90 | HR 61 | Temp 98.6°F | Wt 208.0 lb

## 2014-02-22 DIAGNOSIS — M25469 Effusion, unspecified knee: Secondary | ICD-10-CM | POA: Diagnosis not present

## 2014-02-22 DIAGNOSIS — S8990XA Unspecified injury of unspecified lower leg, initial encounter: Secondary | ICD-10-CM | POA: Diagnosis not present

## 2014-02-22 DIAGNOSIS — M25461 Effusion, right knee: Secondary | ICD-10-CM

## 2014-02-22 DIAGNOSIS — I10 Essential (primary) hypertension: Secondary | ICD-10-CM

## 2014-02-22 DIAGNOSIS — M7989 Other specified soft tissue disorders: Secondary | ICD-10-CM

## 2014-02-22 DIAGNOSIS — S99919A Unspecified injury of unspecified ankle, initial encounter: Secondary | ICD-10-CM | POA: Diagnosis not present

## 2014-02-22 DIAGNOSIS — S99929A Unspecified injury of unspecified foot, initial encounter: Secondary | ICD-10-CM | POA: Diagnosis not present

## 2014-02-22 LAB — CBC WITH DIFFERENTIAL/PLATELET
BASOS PCT: 0.7 % (ref 0.0–3.0)
Basophils Absolute: 0 10*3/uL (ref 0.0–0.1)
EOS PCT: 2.8 % (ref 0.0–5.0)
Eosinophils Absolute: 0.2 10*3/uL (ref 0.0–0.7)
HEMATOCRIT: 40.5 % (ref 36.0–46.0)
HEMOGLOBIN: 13.4 g/dL (ref 12.0–15.0)
LYMPHS PCT: 25 % (ref 12.0–46.0)
Lymphs Abs: 1.8 10*3/uL (ref 0.7–4.0)
MCHC: 33.1 g/dL (ref 30.0–36.0)
MCV: 91.9 fl (ref 78.0–100.0)
MONOS PCT: 7.2 % (ref 3.0–12.0)
Monocytes Absolute: 0.5 10*3/uL (ref 0.1–1.0)
NEUTROS ABS: 4.6 10*3/uL (ref 1.4–7.7)
Neutrophils Relative %: 64.3 % (ref 43.0–77.0)
Platelets: 203 10*3/uL (ref 150.0–400.0)
RBC: 4.41 Mil/uL (ref 3.87–5.11)
RDW: 13.8 % (ref 11.5–14.6)
WBC: 7.2 10*3/uL (ref 4.5–10.5)

## 2014-02-22 MED ORDER — MIRTAZAPINE 15 MG PO TABS: 15.0000 mg | ORAL_TABLET | Freq: Every day | ORAL | Status: DC

## 2014-02-22 NOTE — Progress Notes (Signed)
Subjective:    Patient ID: Cassidy Ortiz, female    DOB: [birth year], 73 y.o.   MRN: [id number]  HPI 73YO female presents for follow up.  HTN - has not been checking BP. Compliant with medication. No headache chest pain or palpitations.  Recently fell 3/6 onto carpeted floor from standing. Thinks she may have tripped on her slipper. Fell forward onto knees. Notes some pain in her right knee as well as swelling in her right leg since that time. Not taking anything for pain. Has not sought care for this.  Review of Systems  Constitutional: Negative for fever, chills, appetite change, fatigue and unexpected weight change.  HENT: Negative for congestion, ear pain, sinus pressure, sore throat, trouble swallowing and voice change.   Eyes: Negative for visual disturbance.  Respiratory: Negative for cough, shortness of breath, wheezing and stridor.   Cardiovascular: Positive for leg swelling. Negative for chest pain and palpitations.  Gastrointestinal: Negative for nausea, vomiting, abdominal pain, diarrhea, constipation, blood in stool, abdominal distention and anal bleeding.  Genitourinary: Negative for dysuria and flank pain.  Musculoskeletal: Positive for arthralgias and myalgias. Negative for gait problem and neck pain.  Skin: Positive for color change. Negative for rash.  Neurological: Negative for dizziness and headaches.  Hematological: Negative for adenopathy. Does not bruise/bleed easily.  Psychiatric/Behavioral: Negative for suicidal ideas, sleep disturbance and dysphoric mood. The patient is not nervous/anxious.        Objective:    BP 130/90  Pulse 61  Temp(Src) 98.6 F (37 C) (Oral)  Wt 208 lb (94.348 kg)  SpO2 95% Physical Exam  Constitutional: She is oriented to person, place, and time. She appears well-developed and well-nourished. No distress.  HENT:  Head: Normocephalic and atraumatic.  Right Ear: External ear normal.  Left Ear: External ear normal.  Nose: Nose  normal.  Mouth/Throat: Oropharynx is clear and moist. No oropharyngeal exudate.  Eyes: Conjunctivae are normal. Pupils are equal, round, and reactive to light. Right eye exhibits no discharge. Left eye exhibits no discharge. No scleral icterus.  Neck: Normal range of motion. Neck supple. No tracheal deviation present. No thyromegaly present.  Cardiovascular: Normal rate, regular rhythm, normal heart sounds and intact distal pulses.  Exam reveals no gallop and no friction rub.   No murmur heard. Pulmonary/Chest: Effort normal and breath sounds normal. No accessory muscle usage. Not tachypneic. No respiratory distress. She has no decreased breath sounds. She has no wheezes. She has no rhonchi. She has no rales. She exhibits no tenderness.  Musculoskeletal: Normal range of motion. She exhibits no edema and no tenderness.       Right knee: She exhibits swelling and ecchymosis. She exhibits normal range of motion and no effusion.  Lymphadenopathy:    She has no cervical adenopathy.  Neurological: She is alert and oriented to person, place, and time. No cranial nerve deficit. She exhibits normal muscle tone. Coordination normal.  Skin: Skin is warm and dry. Ecchymosis noted. No rash noted. She is not diaphoretic. No erythema. No pallor.     Psychiatric: Her behavior is normal. Judgment and thought content normal. She exhibits a depressed mood.          Assessment & Plan:   Problem List Items Addressed This Visit   Hypertension      BP Readings from Last 3 Encounters:  02/22/14 130/90  02/04/14 170/96  01/28/14 190/100   BP improved compared to previous. Will continue current medications.    Right leg swelling  Likely secondary to recent trauma. Will get [place] to evaluate for DVT given right leg>>left leg swelling.    Relevant Orders      Ambulatory referral to Vascular Surgery   Swelling of knee joint, right - Primary     Right knee swollen and surrounding medial calf and thigh  swollen with purple ecchymoses. Xray today shows no joint effusion. CBC is normal. Will get [place] right leg to check for DVT, given swelling so asymmetric right>>left leg, however most likely swelling is secondary to recent trauma.    Relevant Orders      DG Knee Complete 4 Views Right (Completed)      CBC w/Diff (Completed)       Return in about 1 week (around 03/01/2014) for Follow up fall.

## 2014-02-22 NOTE — Assessment & Plan Note (Signed)
BP Readings from Last 3 Encounters:  02/22/14 130/90  02/04/14 170/96  01/28/14 190/100   BP improved compared to previous. Will continue current medications.

## 2014-02-22 NOTE — Assessment & Plan Note (Signed)
Right knee swollen and surrounding medial calf and thigh swollen with purple ecchymoses. Xray today shows no joint effusion. CBC is normal. Will get [place] right leg to check for DVT, given swelling so asymmetric right>>left leg, however most likely swelling is secondary to recent trauma.

## 2014-02-22 NOTE — Progress Notes (Signed)
Pre visit review using our clinic review tool, if applicable. No additional management support is needed unless otherwise documented below in the visit note. 

## 2014-02-22 NOTE — Assessment & Plan Note (Signed)
Likely secondary to recent trauma. Will get [place] to evaluate for DVT given right leg>>left leg swelling.

## 2014-02-23 ENCOUNTER — Telehealth: Payer: Self-pay | Admitting: *Deleted

## 2014-02-23 ENCOUNTER — Ambulatory Visit: Payer: Self-pay | Admitting: Internal Medicine

## 2014-02-23 DIAGNOSIS — R609 Edema, unspecified: Secondary | ICD-10-CM | POA: Diagnosis not present

## 2014-02-23 DIAGNOSIS — M79609 Pain in unspecified limb: Secondary | ICD-10-CM | POA: Diagnosis not present

## 2014-02-23 DIAGNOSIS — M7989 Other specified soft tissue disorders: Secondary | ICD-10-CM | POA: Diagnosis not present

## 2014-02-23 NOTE — Telephone Encounter (Signed)
I only saw the prelim note, but no final report yet. Prelim report was negative for DVT. If she has persistent swelling, then we should set up re-evaluation this week. She may need to be seen by vascular for more thorough evaluation.

## 2014-02-23 NOTE — Telephone Encounter (Signed)
Left a message requesting the results of her test she had done today and what to do next.

## 2014-02-24 NOTE — Telephone Encounter (Signed)
Left message to call back  

## 2014-02-25 NOTE — Telephone Encounter (Signed)
2:45pm on Tuesday?

## 2014-02-25 NOTE — Telephone Encounter (Signed)
Spoke with patient and informed her of the results. Per patient she is still having the swelling and per your note she needs to be seen so she can be re-evaluated. Do not see any openings available, please advise.

## 2014-02-25 NOTE — Telephone Encounter (Signed)
Pt's husband notified, will take appt 03/01/14 @2 :45

## 2014-03-01 ENCOUNTER — Ambulatory Visit (INDEPENDENT_AMBULATORY_CARE_PROVIDER_SITE_OTHER): Payer: Medicare Other | Admitting: Internal Medicine

## 2014-03-01 ENCOUNTER — Encounter: Payer: Self-pay | Admitting: Internal Medicine

## 2014-03-01 VITALS — BP 158/82 | HR 80 | Temp 98.3°F | Wt 211.0 lb

## 2014-03-01 DIAGNOSIS — I10 Essential (primary) hypertension: Secondary | ICD-10-CM

## 2014-03-01 DIAGNOSIS — M7989 Other specified soft tissue disorders: Secondary | ICD-10-CM

## 2014-03-01 NOTE — Assessment & Plan Note (Signed)
BP Readings from Last 3 Encounters:  03/01/14 158/82  02/22/14 130/90  02/04/14 170/96   Blood pressure is elevated today. Will increase carvedilol to 6.25 mg twice daily. Followup in 4 weeks or sooner as needed.

## 2014-03-01 NOTE — Patient Instructions (Signed)
Have you increased Carvedilol to 6.25mg  twice daily? Please let us know by email.

## 2014-03-01 NOTE — Assessment & Plan Note (Signed)
Persistent swelling of her right leg. Ultrasound showed no DVT. She had a minor fall a couple of weeks ago but swelling seems out of proportion to injury. Question if there may have been some kind of damage to previous femoral artery repair. Will set up evaluation with vascular surgery.

## 2014-03-01 NOTE — Progress Notes (Signed)
Subjective:    Patient ID: Cassidy Ortiz, female    DOB: [birth year], 73 y.o.   MRN: [id number]  HPI 73YO female presents for follow up.  LE swelling - she is concerned about persistent swelling in her right lower extremity. There has been no improvement over the last couple of weeks. X-ray of the knee showed no joint effusion. Ultrasound of the right lower extremity showed no DVT. Her lower extremity is painful to touch, particularly over the anterior lower leg. She has not taken any medication for this. She denies weakness in her leg.  HTN - she has not been checking her blood pressure at home. She is compliant with medications. No recent chest pain, headache, palpitations.  Review of Systems  Constitutional: Negative for fever, chills, appetite change, fatigue and unexpected weight change.  HENT: Negative for congestion, ear pain, sinus pressure, sore throat, trouble swallowing and voice change.   Eyes: Negative for visual disturbance.  Respiratory: Negative for cough, shortness of breath, wheezing and stridor.   Cardiovascular: Positive for leg swelling. Negative for chest pain and palpitations.  Gastrointestinal: Negative for nausea, vomiting, abdominal pain, diarrhea, constipation, blood in stool, abdominal distention and anal bleeding.  Genitourinary: Negative for dysuria and flank pain.  Musculoskeletal: Negative for arthralgias, gait problem, myalgias and neck pain.  Skin: Negative for color change and rash.  Neurological: Negative for dizziness and headaches.  Hematological: Negative for adenopathy. Does not bruise/bleed easily.  Psychiatric/Behavioral: Negative for suicidal ideas, sleep disturbance and dysphoric mood. The patient is not nervous/anxious.        Objective:    BP 158/82  Pulse 80  Temp(Src) 98.3 F (36.8 C) (Oral)  Wt 211 lb (95.709 kg)  SpO2 94% Physical Exam  Constitutional: She is oriented to person, place, and time. She appears well-developed and  well-nourished. No distress.  HENT:  Head: Normocephalic and atraumatic.  Right Ear: External ear normal.  Left Ear: External ear normal.  Nose: Nose normal.  Mouth/Throat: Oropharynx is clear and moist. No oropharyngeal exudate.  Eyes: Conjunctivae are normal. Pupils are equal, round, and reactive to light. Right eye exhibits no discharge. Left eye exhibits no discharge. No scleral icterus.  Neck: Normal range of motion. Neck supple. No tracheal deviation present. No thyromegaly present.  Cardiovascular: Normal rate, regular rhythm, normal heart sounds and intact distal pulses.  Exam reveals no gallop and no friction rub.   No murmur heard. Pulmonary/Chest: Effort normal and breath sounds normal. No accessory muscle usage. Not tachypneic. No respiratory distress. She has no decreased breath sounds. She has no wheezes. She has no rhonchi. She has no rales. She exhibits no tenderness.  Musculoskeletal: Normal range of motion. She exhibits edema (Right leg, with purple mottling over medial right knee and thigh which is unchanaged compared to previous). She exhibits no tenderness.  Lymphadenopathy:    She has no cervical adenopathy.  Neurological: She is alert and oriented to person, place, and time. No cranial nerve deficit. She exhibits normal muscle tone. Coordination normal.  Skin: Skin is warm and dry. No rash noted. She is not diaphoretic. No erythema. No pallor.  Psychiatric: She has a normal mood and affect. Her behavior is normal. Judgment and thought content normal.          Assessment & Plan:   Problem List Items Addressed This Visit   Hypertension      BP Readings from Last 3 Encounters:  03/01/14 158/82  02/22/14 130/90  02/04/14 170/96   Blood  pressure is elevated today. Will increase carvedilol to 6.25 mg twice daily. Followup in 4 weeks or sooner as needed.    Right leg swelling - Primary     Persistent swelling of her right leg. Ultrasound showed no DVT. She had a  minor fall a couple of weeks ago but swelling seems out of proportion to injury. Question if there may have been some kind of damage to previous femoral artery repair. Will set up evaluation with vascular surgery.    Relevant Orders      Ambulatory referral to Vascular Surgery       Return in about 4 weeks (around 03/29/2014).

## 2014-03-01 NOTE — Progress Notes (Signed)
Pre visit review using our clinic review tool, if applicable. No additional management support is needed unless otherwise documented below in the visit note. 

## 2014-03-03 ENCOUNTER — Encounter: Payer: Self-pay | Admitting: Emergency Medicine

## 2014-03-15 DIAGNOSIS — E785 Hyperlipidemia, unspecified: Secondary | ICD-10-CM | POA: Diagnosis not present

## 2014-03-15 DIAGNOSIS — M79609 Pain in unspecified limb: Secondary | ICD-10-CM | POA: Diagnosis not present

## 2014-03-15 DIAGNOSIS — I10 Essential (primary) hypertension: Secondary | ICD-10-CM | POA: Diagnosis not present

## 2014-03-15 DIAGNOSIS — M7989 Other specified soft tissue disorders: Secondary | ICD-10-CM | POA: Diagnosis not present

## 2014-03-16 ENCOUNTER — Ambulatory Visit: Payer: Medicare Other | Admitting: Internal Medicine

## 2014-03-16 DIAGNOSIS — I890 Lymphedema, not elsewhere classified: Secondary | ICD-10-CM | POA: Diagnosis not present

## 2014-03-16 DIAGNOSIS — I10 Essential (primary) hypertension: Secondary | ICD-10-CM | POA: Diagnosis not present

## 2014-03-16 DIAGNOSIS — M79609 Pain in unspecified limb: Secondary | ICD-10-CM | POA: Diagnosis not present

## 2014-03-16 DIAGNOSIS — M7989 Other specified soft tissue disorders: Secondary | ICD-10-CM | POA: Diagnosis not present

## 2014-03-28 ENCOUNTER — Other Ambulatory Visit: Payer: Self-pay | Admitting: Internal Medicine

## 2014-03-28 NOTE — Telephone Encounter (Signed)
Electronic Rx request, Please advise. 

## 2014-04-04 ENCOUNTER — Ambulatory Visit (INDEPENDENT_AMBULATORY_CARE_PROVIDER_SITE_OTHER): Payer: Medicare Other | Admitting: Internal Medicine

## 2014-04-04 ENCOUNTER — Encounter: Payer: Self-pay | Admitting: Internal Medicine

## 2014-04-04 VITALS — BP 144/92 | HR 57 | Temp 98.2°F | Resp 16 | Wt 214.5 lb

## 2014-04-04 DIAGNOSIS — F329 Major depressive disorder, single episode, unspecified: Secondary | ICD-10-CM | POA: Diagnosis not present

## 2014-04-04 DIAGNOSIS — M7989 Other specified soft tissue disorders: Secondary | ICD-10-CM

## 2014-04-04 DIAGNOSIS — F3289 Other specified depressive episodes: Secondary | ICD-10-CM

## 2014-04-04 MED ORDER — TRAZODONE HCL 150 MG PO TABS: 150.0000 mg | ORAL_TABLET | Freq: Every day | ORAL | Status: DC

## 2014-04-04 NOTE — Progress Notes (Signed)
Subjective:    Patient ID: Cassidy Ortiz, female    DOB: Feb 16, 1941, 73 y.o.   MRN: [id number]  HPI 73YO female presents for follow up.  LE swelling - Improved swelling RLE. Vascular evaluation was normal.  Depression - Last couple days, notes more depressed mood, however reports this is typical for her to have some variation in symptoms. Generally doing well over last few months.  Had some vomiting and diarrhea about 2 weeks ago. Lasted 24hr. Symptoms have completely resolved. Husband had similar symptoms.  Review of Systems  Constitutional: Negative for fever, chills, appetite change, fatigue and unexpected weight change.  HENT: Negative for congestion, ear pain, sinus pressure, sore throat, trouble swallowing and voice change.   Eyes: Negative for visual disturbance.  Respiratory: Negative for cough, shortness of breath, wheezing and stridor.   Cardiovascular: Positive for leg swelling. Negative for chest pain and palpitations.  Gastrointestinal: Negative for nausea, vomiting, abdominal pain, diarrhea, constipation, blood in stool, abdominal distention and anal bleeding.  Genitourinary: Negative for dysuria and flank pain.  Musculoskeletal: Negative for arthralgias, gait problem, myalgias and neck pain.  Skin: Negative for color change and rash.  Neurological: Negative for dizziness and headaches.  Hematological: Negative for adenopathy. Does not bruise/bleed easily.  Psychiatric/Behavioral: Positive for dysphoric mood. Negative for suicidal ideas and sleep disturbance. The patient is not nervous/anxious.        Objective:    BP 144/92  Pulse 57  Temp(Src) 98.2 F (36.8 C) (Oral)  Resp 16  Wt 214 lb 8 oz (97.297 kg)  SpO2 95% Physical Exam  Constitutional: She is oriented to person, place, and time. She appears well-developed and well-nourished. No distress.  HENT:  Head: Normocephalic and atraumatic.  Right Ear: External ear normal.  Left Ear: External ear normal.    Nose: Nose normal.  Mouth/Throat: Oropharynx is clear and moist. No oropharyngeal exudate.  Eyes: Conjunctivae are normal. Pupils are equal, round, and reactive to light. Right eye exhibits no discharge. Left eye exhibits no discharge. No scleral icterus.  Neck: Normal range of motion. Neck supple. No tracheal deviation present. No thyromegaly present.  Cardiovascular: Normal rate, regular rhythm, normal heart sounds and intact distal pulses.  Exam reveals no gallop and no friction rub.   No murmur heard. Pulmonary/Chest: Effort normal and breath sounds normal. No accessory muscle usage. Not tachypneic. No respiratory distress. She has no decreased breath sounds. She has no wheezes. She has no rhonchi. She has no rales. She exhibits no tenderness.  Musculoskeletal: Normal range of motion. She exhibits no edema and no tenderness.  Lymphadenopathy:    She has no cervical adenopathy.  Neurological: She is alert and oriented to person, place, and time. No cranial nerve deficit. She exhibits normal muscle tone. Coordination normal.  Skin: Skin is warm and dry. No rash noted. She is not diaphoretic. No erythema. No pallor.  Psychiatric: She has a normal mood and affect. Her behavior is normal. Judgment and thought content normal. She expresses no suicidal ideation.          Assessment & Plan:   Problem List Items Addressed This Visit   DEPRESSION - Primary     Recent worsening of symptoms, but some transient increased symptoms common for her. Will leave medications as they are for now. She would like to taper off medications, but will hold for now. Follow up in 3 months or sooner as needed.    Relevant Medications      traZODone (DESYREL) tablet  Right leg swelling     Symptoms improving. Will request notes on recent vascular evaluation. Follow up prn.        Return in about 3 months (around 07/04/2014) for Wellness Visit.

## 2014-04-04 NOTE — Assessment & Plan Note (Signed)
Recent worsening of symptoms, but some transient increased symptoms common for her. Will leave medications as they are for now. She would like to taper off medications, but will hold for now. Follow up in 3 months or sooner as needed.

## 2014-04-04 NOTE — Progress Notes (Signed)
Pre visit review using our clinic review tool, if applicable. No additional management support is needed unless otherwise documented below in the visit note. 

## 2014-04-04 NOTE — Assessment & Plan Note (Signed)
Symptoms improving. Will request notes on recent vascular evaluation. Follow up prn.

## 2014-04-05 ENCOUNTER — Encounter: Payer: Self-pay | Admitting: Internal Medicine

## 2014-04-11 ENCOUNTER — Telehealth: Payer: Self-pay | Admitting: *Deleted

## 2014-04-11 NOTE — Telephone Encounter (Signed)
Spoke with pt, states right lower leg beginning to swell again, this has been going on x 1 month. Just saw Dr. Gilford Rile last Monday, and it was improving, but began to swell again on Wednesday. Denies any new injury. Difficulty standing on that leg. Denies redness or warmth. Is painful due to swelling.

## 2014-04-11 NOTE — Telephone Encounter (Signed)
Pt notified, will call Dr. Bunnie Domino office to follow up.

## 2014-04-11 NOTE — Telephone Encounter (Signed)
Has she had additional follow up with Dr. Lucky Cowboy? He was concerned about lymphedema (based on his note) and was considering follow up with her regarding this. It might be helpful for her to follow up with him and see if compression hose or pump might be helpful.

## 2014-04-13 DIAGNOSIS — M79609 Pain in unspecified limb: Secondary | ICD-10-CM | POA: Diagnosis not present

## 2014-04-13 DIAGNOSIS — I890 Lymphedema, not elsewhere classified: Secondary | ICD-10-CM | POA: Diagnosis not present

## 2014-04-13 DIAGNOSIS — I10 Essential (primary) hypertension: Secondary | ICD-10-CM | POA: Diagnosis not present

## 2014-04-13 DIAGNOSIS — E785 Hyperlipidemia, unspecified: Secondary | ICD-10-CM | POA: Diagnosis not present

## 2014-04-20 ENCOUNTER — Telehealth: Payer: Self-pay | Admitting: Hematology & Oncology

## 2014-04-20 NOTE — Telephone Encounter (Signed)
Patient called and cx 04/21/14 apt due to family emergency.  She will call back to resch

## 2014-04-21 ENCOUNTER — Ambulatory Visit: Payer: Medicare Other | Admitting: Hematology & Oncology

## 2014-04-21 ENCOUNTER — Other Ambulatory Visit: Payer: Medicare Other | Admitting: Lab

## 2014-04-25 ENCOUNTER — Telehealth: Payer: Self-pay | Admitting: *Deleted

## 2014-04-25 NOTE — Telephone Encounter (Signed)
Pt called stating that Dr Rockey Situ diagnosed her with Lymphedema, but now her left leg is swollen as well, she called Dr Donivan Scull nurse who told her that it is common for lymphedema to be in both legs but she wants your opinion as well

## 2014-04-25 NOTE — Telephone Encounter (Signed)
Pt stated that Dr Rockey Situ ordered her a lymphedema leg pump and some compression stockings but they have got to her yet, no changes in medications.  Pt seemed satisfied knowing that you were in agreement with Dr Donivan Scull nurse

## 2014-04-25 NOTE — Telephone Encounter (Signed)
That is true, lymphedema can be in both legs. Did Dr. Rockey Situ start any treatment?

## 2014-04-27 ENCOUNTER — Telehealth: Payer: Self-pay | Admitting: Hematology & Oncology

## 2014-04-27 NOTE — Telephone Encounter (Signed)
Pt rescheduled 5-14 to 6-24

## 2014-05-03 ENCOUNTER — Telehealth: Payer: Self-pay | Admitting: Hematology & Oncology

## 2014-05-03 NOTE — Telephone Encounter (Signed)
Pt called wants to be seen sooner. I transferred to RN for triage

## 2014-05-10 ENCOUNTER — Other Ambulatory Visit: Payer: Self-pay | Admitting: Internal Medicine

## 2014-06-01 ENCOUNTER — Other Ambulatory Visit (HOSPITAL_BASED_OUTPATIENT_CLINIC_OR_DEPARTMENT_OTHER): Payer: Medicare Other | Admitting: Lab

## 2014-06-01 ENCOUNTER — Ambulatory Visit (HOSPITAL_BASED_OUTPATIENT_CLINIC_OR_DEPARTMENT_OTHER): Payer: Medicare Other | Admitting: Hematology & Oncology

## 2014-06-01 VITALS — BP 123/55 | HR 58 | Temp 98.1°F | Resp 20 | Ht 67.0 in | Wt 220.0 lb

## 2014-06-01 DIAGNOSIS — Z853 Personal history of malignant neoplasm of breast: Secondary | ICD-10-CM | POA: Diagnosis not present

## 2014-06-01 DIAGNOSIS — Z85528 Personal history of other malignant neoplasm of kidney: Secondary | ICD-10-CM

## 2014-06-01 DIAGNOSIS — D509 Iron deficiency anemia, unspecified: Secondary | ICD-10-CM

## 2014-06-01 DIAGNOSIS — E039 Hypothyroidism, unspecified: Secondary | ICD-10-CM | POA: Diagnosis not present

## 2014-06-01 DIAGNOSIS — I890 Lymphedema, not elsewhere classified: Secondary | ICD-10-CM

## 2014-06-01 DIAGNOSIS — F3289 Other specified depressive episodes: Secondary | ICD-10-CM | POA: Diagnosis not present

## 2014-06-01 DIAGNOSIS — I509 Heart failure, unspecified: Secondary | ICD-10-CM | POA: Diagnosis not present

## 2014-06-01 DIAGNOSIS — K921 Melena: Secondary | ICD-10-CM | POA: Diagnosis not present

## 2014-06-01 DIAGNOSIS — M7989 Other specified soft tissue disorders: Secondary | ICD-10-CM | POA: Diagnosis not present

## 2014-06-01 DIAGNOSIS — D649 Anemia, unspecified: Secondary | ICD-10-CM | POA: Diagnosis not present

## 2014-06-01 DIAGNOSIS — C50911 Malignant neoplasm of unspecified site of right female breast: Secondary | ICD-10-CM

## 2014-06-01 DIAGNOSIS — K2970 Gastritis, unspecified, without bleeding: Secondary | ICD-10-CM | POA: Diagnosis not present

## 2014-06-01 DIAGNOSIS — E559 Vitamin D deficiency, unspecified: Secondary | ICD-10-CM | POA: Diagnosis not present

## 2014-06-01 DIAGNOSIS — F329 Major depressive disorder, single episode, unspecified: Secondary | ICD-10-CM | POA: Diagnosis not present

## 2014-06-01 DIAGNOSIS — K2990 Gastroduodenitis, unspecified, without bleeding: Secondary | ICD-10-CM | POA: Diagnosis not present

## 2014-06-01 DIAGNOSIS — I5031 Acute diastolic (congestive) heart failure: Secondary | ICD-10-CM | POA: Diagnosis not present

## 2014-06-01 DIAGNOSIS — R011 Cardiac murmur, unspecified: Secondary | ICD-10-CM | POA: Diagnosis not present

## 2014-06-01 DIAGNOSIS — E0500 Thyrotoxicosis with diffuse goiter without thyrotoxic crisis or storm: Secondary | ICD-10-CM

## 2014-06-01 DIAGNOSIS — E785 Hyperlipidemia, unspecified: Secondary | ICD-10-CM | POA: Diagnosis not present

## 2014-06-01 DIAGNOSIS — K219 Gastro-esophageal reflux disease without esophagitis: Secondary | ICD-10-CM | POA: Diagnosis not present

## 2014-06-01 DIAGNOSIS — M25469 Effusion, unspecified knee: Secondary | ICD-10-CM | POA: Diagnosis not present

## 2014-06-01 DIAGNOSIS — I10 Essential (primary) hypertension: Secondary | ICD-10-CM | POA: Diagnosis not present

## 2014-06-01 LAB — CMP (CANCER CENTER ONLY)
ALK PHOS: 83 U/L (ref 26–84)
ALT(SGPT): 20 U/L (ref 10–47)
AST: 23 U/L (ref 11–38)
Albumin: 3.2 g/dL — ABNORMAL LOW (ref 3.3–5.5)
BUN, Bld: 37 mg/dL — ABNORMAL HIGH (ref 7–22)
CO2: 29 mEq/L (ref 18–33)
Calcium: 9.3 mg/dL (ref 8.0–10.3)
Chloride: 105 mEq/L (ref 98–108)
Creat: 1.8 mg/dl — ABNORMAL HIGH (ref 0.6–1.2)
GLUCOSE: 101 mg/dL (ref 73–118)
POTASSIUM: 4.1 meq/L (ref 3.3–4.7)
Sodium: 144 mEq/L (ref 128–145)
TOTAL PROTEIN: 7 g/dL (ref 6.4–8.1)
Total Bilirubin: 0.6 mg/dl (ref 0.20–1.60)

## 2014-06-01 LAB — CBC WITH DIFFERENTIAL (CANCER CENTER ONLY)
BASO#: 0 10*3/uL (ref 0.0–0.2)
BASO%: 0.4 % (ref 0.0–2.0)
EOS%: 4.6 % (ref 0.0–7.0)
Eosinophils Absolute: 0.3 10*3/uL (ref 0.0–0.5)
HCT: 39.2 % (ref 34.8–46.6)
HEMOGLOBIN: 13 g/dL (ref 11.6–15.9)
LYMPH#: 1.7 10*3/uL (ref 0.9–3.3)
LYMPH%: 23.8 % (ref 14.0–48.0)
MCH: 30.7 pg (ref 26.0–34.0)
MCHC: 33.2 g/dL (ref 32.0–36.0)
MCV: 93 fL (ref 81–101)
MONO#: 0.6 10*3/uL (ref 0.1–0.9)
MONO%: 8.8 % (ref 0.0–13.0)
NEUT#: 4.5 10*3/uL (ref 1.5–6.5)
NEUT%: 62.4 % (ref 39.6–80.0)
Platelets: 174 10*3/uL (ref 145–400)
RBC: 4.23 10*6/uL (ref 3.70–5.32)
RDW: 14.5 % (ref 11.1–15.7)
WBC: 7.2 10*3/uL (ref 3.9–10.0)

## 2014-06-01 NOTE — Progress Notes (Signed)
Hematology and Oncology Follow Up Visit  Cassidy Ortiz [id number] 11/16/1941 73 y.o. 06/01/2014   Principle Diagnosis:  1. 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 2. 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 3. Intermittent iron-deficiency anemia.  Current Therapy:    observation     Interim History:  Cassidy Ortiz is back for followup. Since her last saw her back in November, she now has this diagnosis of lymphedema. This is in both legs. She has seen multiple doctors for this. She has on compression stockings. She has is worse in her right leg. It's not clear as to what caused all this. She says when she lies down the swelling is not bad. However, when she starts to walk, she gets a lot of swelling.  Of note, she did have a right kidney taken out. Of note this is a factor. This was for renal cell carcinoma. This is probably 6 years ago.  She continues to gain weight. She just cannot do much because of this lymphedema.  She is on her plethora of medications. She is on 5 different blood pressure medications. I think she can probably stop the Norvasc. This has no associated with leg swelling.  She's had no cough. There's been no shortness of breath. There's been no change in bowel bladder habits. She's had no skin issues. She's had no change in thyroid problems. I think her family doctor is monitor her thyroid function.    Medications: Current outpatient prescriptions:ALPRAZolam (XANAX) 0.25 MG tablet, Take 1 tablet (0.25 mg total) by mouth 3 (three) times daily as needed for anxiety., Disp: 60 tablet, Rfl: 4;  aspirin 81 MG tablet, Take 81 mg by mouth daily. Takes 2 tablets daily, Disp: , Rfl: ;  atorvastatin (LIPITOR) 40 MG tablet, Take 1 tablet (40 mg total) by mouth daily., Disp: 90 tablet, Rfl: 3 Calcium Carbonate-Vit D-Min (CALCIUM 1200 PO), Take 1 tablet by mouth daily., Disp: , Rfl: ;  carvedilol (COREG) 3.125 MG tablet, TAKE 1 TABLET TWICE A DAY,  Disp: 180 tablet, Rfl: 3;  cloNIDine (CATAPRES) 0.1 MG tablet, Take 1 tablet (0.1 mg total) by mouth 3 (three) times daily., Disp: 180 tablet, Rfl: 3 esomeprazole (NEXIUM) 40 MG capsule, Take 1 capsule (40 mg total) by mouth daily before breakfast. Take 40 mg by mouth daily., Disp: 90 capsule, Rfl: 4;  furosemide (LASIX) 20 MG tablet, Take 20 mg by mouth as needed. , Disp: , Rfl: ;  hydrALAZINE (APRESOLINE) 100 MG tablet, Take 1 tablet (100 mg total) by mouth 3 (three) times daily., Disp: 270 tablet, Rfl: 3;  levothyroxine (SYNTHROID) 175 MCG tablet, TAKE 1 TABLET DAILY, Disp: 90 tablet, Rfl: 3 mirtazapine (REMERON) 15 MG tablet, Take 1 tablet (15 mg total) by mouth at bedtime., Disp: 90 tablet, Rfl: 3;  Multiple Vitamin (MULTIVITAMIN) tablet, Take 1 tablet by mouth daily.  , Disp: , Rfl: ;  rOPINIRole (REQUIP) 1 MG tablet, Take 1 mg by mouth daily as needed. , Disp: , Rfl: ;  tizanidine (ZANAFLEX) 2 MG capsule, Take 1-2 capsules (2-4 mg total) by mouth 3 (three) times daily as needed for muscle spasms., Disp: 30 capsule, Rfl: 3 traZODone (DESYREL) 150 MG tablet, Take 1 tablet (150 mg total) by mouth at bedtime., Disp: 90 tablet, Rfl: 2;  VIIBRYD 40 MG TABS, TAKE 1 TABLET DAILY, Disp: 90 tablet, Rfl: 1;  zolpidem (AMBIEN) 5 MG tablet, TAKE 1 TABLET BY MOUTH AT BEDTIME, Disp: 30  tablet, Rfl: 2;  amLODipine (NORVASC) 10 MG tablet, Take 1 tablet (10 mg total) by mouth daily., Disp: 90 tablet, Rfl: 3  Allergies: No Known Allergies  Past Medical History, Surgical history, Social history, and Family History were reviewed and updated.  Review of Systems: As above  Physical Exam:  height is 5\' 7"  (1.702 m) and weight is 220 lb (99.791 kg). Her oral temperature is 98.1 F (36.7 C). Her blood pressure is 123/55 and her pulse is 58. Her respiration is 20 and oxygen saturation is 94%.   Well-developed and well-nourished white female. Head and neck exam shows no ocular or oral lesions. She has no palpable  cervical or supraclavicular lymph nodes. Lungs are clear. Cardiac exam regular in rhythm with no murmurs rubs or bruits. Abdomen is soft. She is good bowel sounds. There is no fluid wave. There is no guarding or rebound tenderness. There is no palpable liver or spleen tip.extremities shows the compression stockings bilaterally. She has minimal edema in her legs. No erythema is noted. She good Ortiz of motion and strength. Skin exam no rashes. Neurological exam is non-focal.breast exam shows left breast no masses edema or erythema. There is no left axillary adenopathy. Right breast shows contraction of the tissue. She has the nipple inversion which is chronic. She has some radiation changes. She has no obvious mass in the right breast. There is no right axillary adenopathy.   Lab Results  Component Value Date   WBC 7.2 06/01/2014   HGB 13.0 06/01/2014   HCT 39.2 06/01/2014   MCV 93 06/01/2014   PLT 174 06/01/2014     Chemistry      Component Value Date/Time   NA 144 06/01/2014 1438   NA 143 01/28/2014 1135   NA 142 07/06/2012 1153   K 4.1 06/01/2014 1438   K 4.4 01/28/2014 1135   CL 105 06/01/2014 1438   CL 106 01/28/2014 1135   CO2 29 06/01/2014 1438   CO2 28 01/28/2014 1135   BUN 37* 06/01/2014 1438   BUN 29* 01/28/2014 1135   BUN 24 07/06/2012 1153   CREATININE 1.8* 06/01/2014 1438   CREATININE 1.4* 01/28/2014 1135      Component Value Date/Time   CALCIUM 9.3 06/01/2014 1438   CALCIUM 9.7 01/28/2014 1135   ALKPHOS 83 06/01/2014 1438   ALKPHOS 77 01/28/2014 1135   AST 23 06/01/2014 1438   AST 21 01/28/2014 1135   ALT 20 06/01/2014 1438   ALT 20 01/28/2014 1135   BILITOT 0.60 06/01/2014 1438   BILITOT 0.5 01/28/2014 1135         Impression and Plan: Cassidy Ortiz is 73 year old female. She has remote history of stage I ductal carcinoma of the right breast.she has a past history of right renal cell carcinoma. Again I don't think these are a problem. I am still not sure why she has lymphedema. Again it  may be somehow related to the kidney surgery that she had.  I do see that additional testing is redone. I'll take is going to change her quality of life.  I want to see her back in about 3 months so that we can reassess her lymphedema.   Volanda Napoleon, MD 6/24/20155:53 PM

## 2014-06-02 LAB — IRON AND TIBC CHCC
%SAT: 30 % (ref 21–57)
Iron: 80 ug/dL (ref 41–142)
TIBC: 265 ug/dL (ref 236–444)
UIBC: 185 ug/dL (ref 120–384)

## 2014-06-02 LAB — FERRITIN CHCC: Ferritin: 38 ng/ml (ref 9–269)

## 2014-06-06 ENCOUNTER — Telehealth: Payer: Self-pay | Admitting: *Deleted

## 2014-06-06 NOTE — Telephone Encounter (Addendum)
Message copied by Lenn Sink on Mon Jun 06, 2014  9:32 AM ------      Message from: Volanda Napoleon      Created: Fri Jun 03, 2014  6:04 PM       Call - iron is ok!!  Laurey Arrow ------Informed pt that iron was okay.

## 2014-06-28 ENCOUNTER — Ambulatory Visit (INDEPENDENT_AMBULATORY_CARE_PROVIDER_SITE_OTHER)
Admission: RE | Admit: 2014-06-28 | Discharge: 2014-06-28 | Disposition: A | Discharge status: Home / Self Care | Payer: Medicare Other | Source: Ambulatory Visit | Attending: Internal Medicine | Admitting: Internal Medicine

## 2014-06-28 ENCOUNTER — Encounter: Payer: Self-pay | Admitting: Internal Medicine

## 2014-06-28 ENCOUNTER — Ambulatory Visit (INDEPENDENT_AMBULATORY_CARE_PROVIDER_SITE_OTHER): Payer: Medicare Other | Admitting: Internal Medicine

## 2014-06-28 VITALS — BP 156/110 | HR 78 | Temp 98.1°F | Wt 222.0 lb

## 2014-06-28 DIAGNOSIS — M79609 Pain in unspecified limb: Secondary | ICD-10-CM

## 2014-06-28 DIAGNOSIS — M79672 Pain in left foot: Secondary | ICD-10-CM

## 2014-06-28 NOTE — Progress Notes (Signed)
Subjective:    Patient ID: Cassidy Ortiz, female    DOB: November 07, 1941, 73 y.o.   MRN: [id number]  HPI  Pt presents to the clinic today with c/o left foot pain. She reports this started about 2-3 weeks ago. The pain is located in the ball of the foot. It does not radiate. She describes the pain as sharp. It is intermittent. She denies any numbness or tingling of the foot. She denies any specific injury to the area or recent fall. She reports that she did have a surgery 10 + years ago where they removed some of the nerves for similar symptoms.  Review of Systems      Past Medical History  Diagnosis Date  . Vitamin D deficiency   . Mixed hyperlipidemia   . Anxiety state, unspecified   . Depressive disorder, not elsewhere classified   . Obstructive sleep apnea (adult) (pediatric)   . Hypertensive kidney disease, benign   . Cerebral aneurysm, nonruptured 2008    s/p coil and shunt, performed in [place]  . Duodenal ulcer   . Chronic kidney disease, stage III (moderate)   . Acquired cyst of kidney   . Abnormal weight gain   . Fatty liver   . Diverticulosis of colon (without mention of hemorrhage)   . Personal history of colonic polyps 10/22/2002    hyperplastic   . Hypertension   . Duodenal mass   . Baker's cyst of knee     Left  . Edema   . SOB (shortness of breath)   . Malignant neoplasm of breast (female), unspecified site 2001    right, s/p lumpectomy and XRT  . 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 Unspecified hypothyroidism   . Grave's disease   . Hypothyroid     Current Outpatient Prescriptions  Medication Sig Dispense Refill  . ALPRAZolam (XANAX) 0.25 MG tablet Take 1 tablet (0.25 mg total) by mouth 3 (three) times daily as needed for anxiety.  60 tablet  4  . amLODipine (NORVASC) 10 MG tablet Take 1 tablet (10 mg total) by mouth daily.  90 tablet  3  . aspirin 81 MG tablet Take 81 mg by mouth daily. Takes 2 tablets daily      .  atorvastatin (LIPITOR) 40 MG tablet Take 1 tablet (40 mg total) by mouth daily.  90 tablet  3  . Calcium Carbonate-Vit D-Min (CALCIUM 1200 PO) Take 1 tablet by mouth daily.      . carvedilol (COREG) 3.125 MG tablet TAKE 1 TABLET TWICE A DAY  180 tablet  3  . cloNIDine (CATAPRES) 0.1 MG tablet Take 1 tablet (0.1 mg total) by mouth 3 (three) times daily.  180 tablet  3  . esomeprazole (NEXIUM) 40 MG capsule Take 1 capsule (40 mg total) by mouth daily before breakfast. Take 40 mg by mouth daily.  90 capsule  4  . furosemide (LASIX) 20 MG tablet Take 20 mg by mouth as needed.       . hydrALAZINE (APRESOLINE) 100 MG tablet Take 1 tablet (100 mg total) by mouth 3 (three) times daily.  270 tablet  3  . levothyroxine (SYNTHROID) 175 MCG tablet TAKE 1 TABLET DAILY  90 tablet  3  . mirtazapine (REMERON) 15 MG tablet Take 1 tablet (15 mg total) by mouth at bedtime.  90 tablet  3  . Multiple Vitamin (MULTIVITAMIN) tablet Take 1 tablet by mouth daily.        Marland Kitchen  rOPINIRole (REQUIP) 1 MG tablet Take 1 mg by mouth daily as needed.       . tizanidine (ZANAFLEX) 2 MG capsule Take 1-2 capsules (2-4 mg total) by mouth 3 (three) times daily as needed for muscle spasms.  30 capsule  3  . traZODone (DESYREL) 150 MG tablet Take 1 tablet (150 mg total) by mouth at bedtime.  90 tablet  2  . VIIBRYD 40 MG TABS TAKE 1 TABLET DAILY  90 tablet  1  . zolpidem (AMBIEN) 5 MG tablet TAKE 1 TABLET BY MOUTH AT BEDTIME  30 tablet  2   No current facility-administered medications for this visit.    No Known Allergies  Family History  Problem Relation Age of Onset  . Colon cancer Sister   . Cancer Sister     colon  . Heart disease Father     History   Social History  . Marital Status: Married    Spouse Name: N/A    Number of Children: 3  . Years of Education: N/A   Occupational History  . retired    Social History Main Topics  . Smoking status: Former Smoker    Types: Cigarettes    Quit date: 11/24/2007  .  Smokeless tobacco: Never Used  . Alcohol Use: 0.6 oz/week    1 Glasses of wine per week     Comment: occasssionally  . Drug Use: No  . Sexual Activity: Not on file   Other Topics Concern  . Not on file   Social History Narrative   Lives in [place] with husband. From [place]. Children live [place] and [place].   Pets - 1 cat in home.      Work - retired Theme park manager, Educational psychologist   Diet - regular, limited calories           Constitutional: Denies fever, malaise, fatigue, headache or abrupt weight changes.  Musculoskeletal: Pt reports left foot pain. Denies decrease in range of motion, difficulty with gait, muscle pain or joint pain and swelling.  Neurological: Denies dizziness, difficulty with memory, difficulty with speech or problems with balance and coordination.   No other specific complaints in a complete review of systems (except as listed in HPI above).  Objective:   Physical Exam   BP 156/110  Pulse 78  Temp(Src) 98.1 F (36.7 C) (Oral)  Wt 222 lb (100.699 kg)  SpO2 98% Wt Readings from Last 3 Encounters:  06/28/14 222 lb (100.699 kg)  06/01/14 220 lb (99.791 kg)  04/04/14 214 lb 8 oz (97.297 kg)    General: Appears her stated age, chronically ill appearing, in NAD. Cardiovascular: Normal rate and rhythm. S1,S2 noted.  No murmur, rubs or gallops noted. No JVD or BLE edema. No carotid bruits noted. Pulmonary/Chest: Normal effort and positive vesicular breath sounds. No respiratory distress. No wheezes, rales or ronchi noted.  Musculoskeletal: Normal range of motion. No pain with palpation of the left foot.  No difficulty with gait.  Neurological: Alert and oriented. Cranial nerves II-XII intact. Coordination normal. +DTRs bilaterally.   BMET    Component Value Date/Time   NA 144 06/01/2014 1438   NA 143 01/28/2014 1135   NA 142 07/06/2012 1153   K 4.1 06/01/2014 1438   K 4.4 01/28/2014 1135   CL 105 06/01/2014 1438   CL 106 01/28/2014 1135   CO2 29 06/01/2014 1438   CO2 28  01/28/2014 1135   GLUCOSE 101 06/01/2014 1438   GLUCOSE 95 01/28/2014 1135  GLUCOSE 103* 07/06/2012 1153   BUN 37* 06/01/2014 1438   BUN 29* 01/28/2014 1135   BUN 24 07/06/2012 1153   CREATININE 1.8* 06/01/2014 1438   CREATININE 1.4* 01/28/2014 1135   CALCIUM 9.3 06/01/2014 1438   CALCIUM 9.7 01/28/2014 1135   GFRNONAA 46* 07/06/2012 1153   GFRAA 53* 07/06/2012 1153    Lipid Panel  No results found for this basename: chol, trig, hdl, cholhdl, vldl, ldlcalc    CBC    Component Value Date/Time   WBC 7.2 06/01/2014 1438   WBC 7.2 02/22/2014 1447   RBC 4.23 06/01/2014 1438   RBC 4.41 02/22/2014 1447   HGB 13.0 06/01/2014 1438   HGB 13.4 02/22/2014 1447   HCT 39.2 06/01/2014 1438   HCT 40.5 02/22/2014 1447   PLT 174 06/01/2014 1438   PLT 203.0 02/22/2014 1447   MCV 93 06/01/2014 1438   MCV 91.9 02/22/2014 1447   MCH 30.7 06/01/2014 1438   MCH 30.4 09/10/2013 1531   MCHC 33.2 06/01/2014 1438   MCHC 33.1 02/22/2014 1447   RDW 14.5 06/01/2014 1438   RDW 13.8 02/22/2014 1447   LYMPHSABS 1.7 06/01/2014 1438   LYMPHSABS 1.8 02/22/2014 1447   MONOABS 0.5 02/22/2014 1447   EOSABS 0.3 06/01/2014 1438   EOSABS 0.2 02/22/2014 1447   BASOSABS 0.0 06/01/2014 1438   BASOSABS 0.0 02/22/2014 1447    Hgb A1C No results found for this basename: HGBA1C        Assessment & Plan:   Left foot pain:  Will obtain xray first If needed, she would like referral to podiatry OK to take tylenol/ibuprofen OTC  Will follow up with you after xray is back

## 2014-06-28 NOTE — Progress Notes (Signed)
Pre visit review using our clinic review tool, if applicable. No additional management support is needed unless otherwise documented below in the visit note. 

## 2014-06-28 NOTE — Patient Instructions (Signed)
Heel Spur A heel spur is a hook of bone that can form on the calcaneus (the heel bone and the largest bone of the foot). Heel spurs are often associated with plantar fasciitis and usually come in people who have had the problem for an extended period of time. The cause of the relationship is unknown. The pain associated with them is thought to be caused by an inflammation (soreness and redness) of the plantar fascia rather than the spur itself. The plantar fascia is a thick fibrous like tissue that runs from the calcaneus (heel bone) to the ball of the foot. This strong, tight tissue helps maintain the arch of your foot. It helps distribute the weight across your foot as you walk or run. Stresses placed on the plantar fascia can be tremendous. When it is inflamed normal activities become painful. Pain is worse in the morning after sleeping. After sleeping the plantar fascia is tight. The first movements stretch the fascia and this causes pain. As the tendon loosens, the pain usually gets better. It often returns with too much standing or walking.  About 70% of patients with plantar fasciitis have a heel spur. About half of people without foot pain also have heel spurs. DIAGNOSIS  The diagnosis of a heel spur is made by X-ray. The X-ray shows a hook of bone protruding from the bottom of the calcaneus at the point where the plantar fascia is attached to the heel bone.  TREATMENT  It is necessary to find out what is causing the stretching of the plantar fascia. If the cause is over-pronation (flat feet), orthotics and proper foot ware may help.  Stretching exercises, losing weight, wearing shoes that have a cushioned heel that absorbs shock, and elevating the heel with the use of a heel cradle, heel cup, or orthotics may all help. Heel cradles and heel cups provide extra comfort and cushion to the heel, and reduce the amount of shock to the sore area. AVOIDING THE PAIN OF PLANTAR FASCIITIS AND HEEL  SPURS  Consult a sports medicine professional before beginning a new exercise program.  Walking programs offer a good workout. There is a lower chance of overuse injuries common to the runners. There is less impact and less jarring of the joints.  Begin all new exercise programs slowly. If problems or pains develop, decrease the amount of time or distance until you are at a comfortable level.  Wear good shoes and replace them regularly.  Stretch your foot and the heel cords at the back of the ankle (Achilles tendons) both before and after exercise.  Run or exercise on even surfaces that are not hard. For example, asphalt is better than pavement.  Do not run barefoot on hard surfaces.  If using a treadmill, vary the incline.  Do not continue to workout if you have foot or joint problems. Seek professional help if they do not improve. HOME CARE INSTRUCTIONS   Avoid activities that cause you pain until you recover.  Use ice or cold packs to the problem or painful areas after working out.  Only take over-the-counter or prescription medicines for pain, discomfort, or fever as directed by your caregiver.  Soft shoe inserts or athletic shoes with air or gel sole cushions may be helpful.  If problems continue or become more severe, consult a sports medicine caregiver. Cortisone is a potent anti-inflammatory medication that may be injected into the painful area. You can discuss this treatment with your caregiver. MAKE SURE YOU:  Understand these instructions.  Will watch your condition.  Will get help right away if you are not doing well or get worse. Document Released: 01/01/2006 Document Revised: 02/17/2012 Document Reviewed: 03/05/2006 [facility] Patient Information 2015 [place], [place]. This information is not intended to replace advice given to you by your health care provider. Make sure you discuss any questions you have with your health care provider.

## 2014-06-29 ENCOUNTER — Telehealth: Payer: Self-pay | Admitting: Internal Medicine

## 2014-06-29 NOTE — Telephone Encounter (Signed)
Pt returned call from [facility] and requested the number for [place].  Gave number for [facility] [place], [place], Dr. Roselind Messier per [facility] notes.  Pt will call us back if she needs further assistance / lt

## 2014-07-07 ENCOUNTER — Ambulatory Visit: Payer: Self-pay | Admitting: Podiatry

## 2014-07-13 ENCOUNTER — Ambulatory Visit (INDEPENDENT_AMBULATORY_CARE_PROVIDER_SITE_OTHER): Payer: Medicare Other | Admitting: Internal Medicine

## 2014-07-13 ENCOUNTER — Encounter: Payer: Self-pay | Admitting: Internal Medicine

## 2014-07-13 VITALS — BP 138/88 | HR 65 | Temp 99.0°F | Ht 65.5 in | Wt 217.5 lb

## 2014-07-13 DIAGNOSIS — Z23 Encounter for immunization: Secondary | ICD-10-CM | POA: Diagnosis not present

## 2014-07-13 DIAGNOSIS — F329 Major depressive disorder, single episode, unspecified: Secondary | ICD-10-CM

## 2014-07-13 DIAGNOSIS — I10 Essential (primary) hypertension: Secondary | ICD-10-CM | POA: Diagnosis not present

## 2014-07-13 DIAGNOSIS — Z0000 Encounter for general adult medical examination without abnormal findings: Secondary | ICD-10-CM | POA: Diagnosis not present

## 2014-07-13 DIAGNOSIS — N183 Chronic kidney disease, stage 3 unspecified: Secondary | ICD-10-CM

## 2014-07-13 DIAGNOSIS — F3289 Other specified depressive episodes: Secondary | ICD-10-CM

## 2014-07-13 DIAGNOSIS — E785 Hyperlipidemia, unspecified: Secondary | ICD-10-CM

## 2014-07-13 LAB — COMPREHENSIVE METABOLIC PANEL
ALT: 18 U/L (ref 0–35)
AST: 19 U/L (ref 0–37)
Albumin: 3.8 g/dL (ref 3.5–5.2)
Alkaline Phosphatase: 78 U/L (ref 39–117)
BILIRUBIN TOTAL: 0.6 mg/dL (ref 0.2–1.2)
BUN: 34 mg/dL — AB (ref 6–23)
CO2: 29 mEq/L (ref 19–32)
CREATININE: 1.6 mg/dL — AB (ref 0.4–1.2)
Calcium: 9.4 mg/dL (ref 8.4–10.5)
Chloride: 104 mEq/L (ref 96–112)
GFR: 34.23 mL/min — ABNORMAL LOW (ref >60.00)
Glucose, Bld: 91 mg/dL (ref 70–99)
Potassium: 4.4 mEq/L (ref 3.5–5.1)
Sodium: 141 mEq/L (ref 135–145)
Total Protein: 6.6 g/dL (ref 6.0–8.3)

## 2014-07-13 LAB — LIPID PANEL
CHOL/HDL RATIO: 3
Cholesterol: 150 mg/dL (ref 0–200)
HDL: 58.2 mg/dL (ref >39.00)
LDL CALC: 69 mg/dL (ref 0–99)
NonHDL: 91.8
TRIGLYCERIDES: 114 mg/dL (ref 0.0–149.0)
VLDL: 22.8 mg/dL (ref 0.0–40.0)

## 2014-07-13 MED ORDER — CARVEDILOL 6.25 MG PO TABS: 6.2500 mg | ORAL_TABLET | Freq: Two times a day (BID) | ORAL | Status: DC

## 2014-07-13 NOTE — Assessment & Plan Note (Signed)
BP Readings from Last 3 Encounters:  07/13/14 138/88  06/28/14 156/110  06/01/14 123/55   BP well controlled on current medications. Will recheck renal function with labs today.

## 2014-07-13 NOTE — Addendum Note (Signed)
Addended by: Vernetta Honey on: 07/13/2014 04:12 PM   Modules accepted: Orders

## 2014-07-13 NOTE — Addendum Note (Signed)
Addended by: Ronette Deter A on: 07/13/2014 05:22 PM   Modules accepted: Orders

## 2014-07-13 NOTE — Progress Notes (Signed)
Subjective:    Patient ID: Cassidy Ortiz, female    DOB: 05-27-1941, 73 y.o.   MRN: [id number]  HPI The patient is here for annual Medicare wellness examination and management of other chronic and acute problems.   The risk factors are reflected in the social history.  The roster of all physicians providing medical care to patient - is listed in the Snapshot section of the chart.  Activities of daily living:  The patient is 100% independent in all ADLs: dressing, toileting, feeding as well as independent mobility. Lives with husband, and cat. One story home.  Home safety : The patient has smoke detectors in the home. They wear seatbelts.  There are no firearms at home. There is no violence in the home.   There is no risks for hepatitis, STDs or HIV. There is no history of blood transfusion. They have no travel history to infectious disease endemic areas of the world.  The patient has not seen their dentist in the last six month. Wears dentures. They have seen their eye doctor in the last year. Benedict They admit to slight hearing difficulty with regard to whispered voices and some television programs.  They have deferred audiologic testing in the last year.   They do not  have excessive sun exposure. Discussed the need for sun protection: hats, long sleeves and use of sunscreen if there is significant sun exposure. Will establish with dermatologist.  Diet: the importance of a healthy diet is discussed. They do have a healthy diet.  The benefits of regular aerobic exercise were discussed. Limited by leg swelling.  Depression screen:Recent poor control of depression.  Cognitive assessment: the patient manages all their financial and personal affairs and is actively engaged. They could relate day,date,year and events.  The following portions of the patient's history were reviewed and updated as appropriate: allergies, current medications, past family history, past medical  history,  past surgical history, past social history  and problem list.  Visual acuity was not assessed per patient preference since she has regular follow up with her ophthalmologist. Hearing and body mass index were assessed and reviewed.   During the course of the visit the patient was educated and counseled about appropriate screening and preventive services including : fall prevention , diabetes screening, nutrition counseling, colorectal cancer screening, and recommended immunizations.    HCPOA - not set up    Review of Systems  Constitutional: Negative for fever, chills, appetite change, fatigue and unexpected weight change.  Eyes: Negative for visual disturbance.  Respiratory: Negative for shortness of breath.   Cardiovascular: Negative for chest pain and leg swelling.  Gastrointestinal: Negative for abdominal pain.  Skin: Negative for color change and rash.  Hematological: Negative for adenopathy. Does not bruise/bleed easily.  Psychiatric/Behavioral: Negative for dysphoric mood. The patient is not nervous/anxious.        Objective:    BP 138/88  Pulse 65  Temp(Src) 99 F (37.2 C) (Oral)  Ht 5' 5.5" (1.664 m)  Wt 217 lb 8 oz (98.657 kg)  BMI 35.63 kg/m2  SpO2 94% Physical Exam  Constitutional: She is oriented to person, place, and time. She appears well-developed and well-nourished. No distress.  HENT:  Head: Normocephalic and atraumatic.  Right Ear: External ear normal.  Left Ear: External ear normal.  Nose: Nose normal.  Mouth/Throat: Oropharynx is clear and moist. No oropharyngeal exudate.  Eyes: Conjunctivae are normal. Pupils are equal, round, and reactive to light. Right eye exhibits  no discharge. Left eye exhibits no discharge. No scleral icterus.  Neck: Normal range of motion. Neck supple. No tracheal deviation present. No thyromegaly present.  Cardiovascular: Normal rate, regular rhythm, normal heart sounds and intact distal pulses.  Exam reveals no gallop  and no friction rub.   No murmur heard. Pulmonary/Chest: Effort normal and breath sounds normal. No accessory muscle usage. Not tachypneic. No respiratory distress. She has no decreased breath sounds. She has no wheezes. She has no rales. She exhibits no tenderness.  Abdominal: Soft. Bowel sounds are normal. She exhibits no distension and no mass. There is no tenderness. There is no rebound and no guarding.  Musculoskeletal: Normal range of motion. She exhibits no edema and no tenderness.  Lymphadenopathy:    She has no cervical adenopathy.  Neurological: She is alert and oriented to person, place, and time. No cranial nerve deficit. She exhibits normal muscle tone. Coordination normal.  Skin: Skin is warm and dry. No rash noted. She is not diaphoretic. No erythema. No pallor.  Psychiatric: She has a normal mood and affect. Her behavior is normal. Judgment and thought content normal.          Assessment & Plan:   Problem List Items Addressed This Visit     Unprioritized   DEPRESSION     Chronic symptoms of mild depression. Some recent worsening symptoms of apathy, but generally well controlled with current medications. She would like to stop Trazodone which she has been taking for sleep. Will stop Trazodone and follow up in 4 weeks.    Hyperlipidemia   Relevant Medications      carvedilol (COREG) tablet   Other Relevant Orders      Comprehensive metabolic panel      Lipid panel   Hypertension      BP Readings from Last 3 Encounters:  07/13/14 138/88  06/28/14 156/110  06/01/14 123/55   BP well controlled on current medications. Will recheck renal function with labs today.    Relevant Medications      carvedilol (COREG) tablet   Medicare annual wellness visit, subsequent - Primary     General medical exam normal today. PAP and pelvic deferred as previous PAP normal, and pt age >31. Breast exam deferred as per pt preference, performed by her oncologist. Mammogram UTD and  reviewed. Colonoscopy UTD. Prevnar given today.  Appropriate screening performed.  Follow up 3 months and prn.      Morbid obesity      Wt Readings from Last 3 Encounters:  07/13/14 217 lb 8 oz (98.657 kg)  06/28/14 222 lb (100.699 kg)  06/01/14 220 lb (99.791 kg)   Body mass index is 35.63 kg/(m^2). Encouraged healthy diet and regular exercise such as walking with goal of weight loss.        Return in about 4 weeks (around 08/10/2014) for Recheck.

## 2014-07-13 NOTE — Progress Notes (Signed)
Pre visit review using our clinic review tool, if applicable. No additional management support is needed unless otherwise documented below in the visit note. 

## 2014-07-13 NOTE — Assessment & Plan Note (Addendum)
General medical exam normal today. PAP and pelvic deferred as previous PAP normal, and pt age >49. Breast exam deferred as per pt preference, performed by her oncologist. Mammogram UTD and reviewed. Colonoscopy UTD. Prevnar given today.  Appropriate screening performed.  Follow up 3 months and prn.

## 2014-07-13 NOTE — Patient Instructions (Signed)
Try stopping Trazodone. Continue Ambien to help with sleep. Email with update next week.  Follow up in 4 weeks.

## 2014-07-13 NOTE — Assessment & Plan Note (Signed)
Wt Readings from Last 3 Encounters:  07/13/14 217 lb 8 oz (98.657 kg)  06/28/14 222 lb (100.699 kg)  06/01/14 220 lb (99.791 kg)   Body mass index is 35.63 kg/(m^2). Encouraged healthy diet and regular exercise such as walking with goal of weight loss.

## 2014-07-13 NOTE — Assessment & Plan Note (Signed)
Chronic symptoms of mild depression. Some recent worsening symptoms of apathy, but generally well controlled with current medications. She would like to stop Trazodone which she has been taking for sleep. Will stop Trazodone and follow up in 4 weeks.

## 2014-07-26 ENCOUNTER — Other Ambulatory Visit: Payer: Self-pay | Admitting: Internal Medicine

## 2014-07-28 ENCOUNTER — Encounter: Payer: Self-pay | Admitting: Gastroenterology

## 2014-08-11 DIAGNOSIS — N039 Chronic nephritic syndrome with unspecified morphologic changes: Secondary | ICD-10-CM | POA: Diagnosis not present

## 2014-08-11 DIAGNOSIS — I10 Essential (primary) hypertension: Secondary | ICD-10-CM | POA: Diagnosis not present

## 2014-08-11 DIAGNOSIS — N2581 Secondary hyperparathyroidism of renal origin: Secondary | ICD-10-CM | POA: Diagnosis not present

## 2014-08-11 DIAGNOSIS — D631 Anemia in chronic kidney disease: Secondary | ICD-10-CM | POA: Diagnosis not present

## 2014-08-11 DIAGNOSIS — N183 Chronic kidney disease, stage 3 unspecified: Secondary | ICD-10-CM | POA: Diagnosis not present

## 2014-08-11 DIAGNOSIS — R809 Proteinuria, unspecified: Secondary | ICD-10-CM | POA: Diagnosis not present

## 2014-08-18 ENCOUNTER — Ambulatory Visit (INDEPENDENT_AMBULATORY_CARE_PROVIDER_SITE_OTHER): Payer: Medicare Other | Admitting: Internal Medicine

## 2014-08-18 ENCOUNTER — Encounter: Payer: Self-pay | Admitting: Internal Medicine

## 2014-08-18 VITALS — BP 150/82 | HR 52 | Temp 98.7°F | Ht 65.5 in | Wt 214.5 lb

## 2014-08-18 DIAGNOSIS — F329 Major depressive disorder, single episode, unspecified: Secondary | ICD-10-CM

## 2014-08-18 DIAGNOSIS — E669 Obesity, unspecified: Secondary | ICD-10-CM | POA: Diagnosis not present

## 2014-08-18 DIAGNOSIS — G4700 Insomnia, unspecified: Secondary | ICD-10-CM

## 2014-08-18 DIAGNOSIS — Z23 Encounter for immunization: Secondary | ICD-10-CM

## 2014-08-18 DIAGNOSIS — F3289 Other specified depressive episodes: Secondary | ICD-10-CM

## 2014-08-18 MED ORDER — ZOLPIDEM TARTRATE ER 6.25 MG PO TBCR: 6.2500 mg | EXTENDED_RELEASE_TABLET | Freq: Every evening | ORAL | Status: DC | PRN

## 2014-08-18 NOTE — Patient Instructions (Signed)
Start Ambien 6.25mg  daily at bedtime.  Follow up in 3 months.

## 2014-08-18 NOTE — Assessment & Plan Note (Signed)
Symptoms well controlled on current medications. Will continue. 

## 2014-08-18 NOTE — Progress Notes (Signed)
Pre visit review using our clinic review tool, if applicable. No additional management support is needed unless otherwise documented below in the visit note. 

## 2014-08-18 NOTE — Progress Notes (Signed)
Subjective:    Patient ID: Cassidy Ortiz, female    DOB: 1941/01/29, 73 y.o.   MRN: [id number]  HPI 73YO female presents for follow up.  Not sleeping well. Takes ambien 5mg , sleeps 2-3 hr, then up again and cannot go back to sleep.  Symptoms of depression recently well controlled. Concerned about weight gain on Remeron, however has lost 4lb over last month.   Review of Systems  Constitutional: Negative for fever, chills, appetite change, fatigue and unexpected weight change.  Eyes: Negative for visual disturbance.  Respiratory: Negative for shortness of breath.   Cardiovascular: Negative for chest pain and leg swelling.  Gastrointestinal: Negative for nausea, vomiting, abdominal pain, diarrhea and constipation.  Skin: Negative for color change and rash.  Hematological: Negative for adenopathy. Does not bruise/bleed easily.  Psychiatric/Behavioral: Positive for sleep disturbance. Negative for dysphoric mood. The patient is not nervous/anxious.        Objective:    BP 150/82  Pulse 52  Temp(Src) 98.7 F (37.1 C) (Oral)  Ht 5' 5.5" (1.664 m)  Wt 214 lb 8 oz (97.297 kg)  BMI 35.14 kg/m2  SpO2 97% Physical Exam  Constitutional: She is oriented to person, place, and time. She appears well-developed and well-nourished. No distress.  HENT:  Head: Normocephalic and atraumatic.  Right Ear: External ear normal.  Left Ear: External ear normal.  Nose: Nose normal.  Mouth/Throat: Oropharynx is clear and moist. No oropharyngeal exudate.  Eyes: Conjunctivae are normal. Pupils are equal, round, and reactive to light. Right eye exhibits no discharge. Left eye exhibits no discharge. No scleral icterus.  Neck: Normal range of motion. Neck supple. No tracheal deviation present. No thyromegaly present.  Cardiovascular: Normal rate, regular rhythm, normal heart sounds and intact distal pulses.  Exam reveals no gallop and no friction rub.   No murmur heard. Pulmonary/Chest: Effort normal and  breath sounds normal. No accessory muscle usage. Not tachypneic. No respiratory distress. She has no decreased breath sounds. She has no wheezes. She has no rhonchi. She has no rales. She exhibits no tenderness.  Musculoskeletal: Normal range of motion. She exhibits no edema and no tenderness.  Lymphadenopathy:    She has no cervical adenopathy.  Neurological: She is alert and oriented to person, place, and time. No cranial nerve deficit. She exhibits normal muscle tone. Coordination normal.  Skin: Skin is warm and dry. No rash noted. She is not diaphoretic. No erythema. No pallor.  Psychiatric: She has a normal mood and affect. Her speech is normal and behavior is normal. Judgment and thought content normal. Cognition and memory are normal.          Assessment & Plan:   Problem List Items Addressed This Visit     Unprioritized   DEPRESSION - Primary     Symptoms well controlled on current medications. Will continue.    Insomnia     Will try changing to long acting Ambien. Discussed potential risks of this medication. Follow up if symptoms are not improving.    Relevant Medications      zolpidem (AMBIEN CR) CR tablet   Obesity (BMI 30-39.9)      Wt Readings from Last 3 Encounters:  08/18/14 214 lb 8 oz (97.297 kg)  07/13/14 217 lb 8 oz (98.657 kg)  06/28/14 222 lb (100.699 kg)   Congratulated pt on weight loss. Encouraged continued healthy diet and exercise.     Other Visit Diagnoses   Need for prophylactic vaccination and inoculation against influenza  Relevant Orders       Flu Vaccine QUAD 36+ mos PF IM (Fluarix Quad PF) (Completed)        Return in about 3 months (around 11/17/2014).

## 2014-08-18 NOTE — Assessment & Plan Note (Addendum)
Will try changing to long acting Ambien. Discussed potential risks of this medication. Follow up if symptoms are not improving.

## 2014-08-18 NOTE — Assessment & Plan Note (Signed)
Wt Readings from Last 3 Encounters:  08/18/14 214 lb 8 oz (97.297 kg)  07/13/14 217 lb 8 oz (98.657 kg)  06/28/14 222 lb (100.699 kg)   Congratulated pt on weight loss. Encouraged continued healthy diet and exercise.

## 2014-08-25 ENCOUNTER — Other Ambulatory Visit: Payer: Self-pay | Admitting: Cardiovascular Disease

## 2014-09-01 ENCOUNTER — Other Ambulatory Visit (HOSPITAL_BASED_OUTPATIENT_CLINIC_OR_DEPARTMENT_OTHER): Payer: Medicare Other | Admitting: Lab

## 2014-09-01 ENCOUNTER — Encounter: Payer: Self-pay | Admitting: Family

## 2014-09-01 ENCOUNTER — Ambulatory Visit (HOSPITAL_BASED_OUTPATIENT_CLINIC_OR_DEPARTMENT_OTHER): Payer: Medicare Other | Admitting: Family

## 2014-09-01 VITALS — BP 165/62 | HR 55 | Temp 98.4°F | Resp 14 | Ht 65.0 in | Wt 212.0 lb

## 2014-09-01 DIAGNOSIS — F329 Major depressive disorder, single episode, unspecified: Secondary | ICD-10-CM | POA: Diagnosis not present

## 2014-09-01 DIAGNOSIS — M25469 Effusion, unspecified knee: Secondary | ICD-10-CM | POA: Diagnosis not present

## 2014-09-01 DIAGNOSIS — F3289 Other specified depressive episodes: Secondary | ICD-10-CM | POA: Diagnosis not present

## 2014-09-01 DIAGNOSIS — I10 Essential (primary) hypertension: Secondary | ICD-10-CM | POA: Diagnosis not present

## 2014-09-01 DIAGNOSIS — D509 Iron deficiency anemia, unspecified: Secondary | ICD-10-CM

## 2014-09-01 DIAGNOSIS — K2970 Gastritis, unspecified, without bleeding: Secondary | ICD-10-CM | POA: Diagnosis not present

## 2014-09-01 DIAGNOSIS — I890 Lymphedema, not elsewhere classified: Secondary | ICD-10-CM | POA: Diagnosis not present

## 2014-09-01 DIAGNOSIS — K219 Gastro-esophageal reflux disease without esophagitis: Secondary | ICD-10-CM | POA: Diagnosis not present

## 2014-09-01 DIAGNOSIS — C50911 Malignant neoplasm of unspecified site of right female breast: Secondary | ICD-10-CM

## 2014-09-01 DIAGNOSIS — Z853 Personal history of malignant neoplasm of breast: Secondary | ICD-10-CM

## 2014-09-01 DIAGNOSIS — I5031 Acute diastolic (congestive) heart failure: Secondary | ICD-10-CM | POA: Diagnosis not present

## 2014-09-01 DIAGNOSIS — I509 Heart failure, unspecified: Secondary | ICD-10-CM | POA: Diagnosis not present

## 2014-09-01 DIAGNOSIS — K921 Melena: Secondary | ICD-10-CM | POA: Diagnosis not present

## 2014-09-01 DIAGNOSIS — Z85528 Personal history of other malignant neoplasm of kidney: Secondary | ICD-10-CM

## 2014-09-01 DIAGNOSIS — M7989 Other specified soft tissue disorders: Secondary | ICD-10-CM | POA: Diagnosis not present

## 2014-09-01 DIAGNOSIS — E039 Hypothyroidism, unspecified: Secondary | ICD-10-CM | POA: Diagnosis not present

## 2014-09-01 DIAGNOSIS — D649 Anemia, unspecified: Secondary | ICD-10-CM | POA: Diagnosis not present

## 2014-09-01 DIAGNOSIS — R011 Cardiac murmur, unspecified: Secondary | ICD-10-CM | POA: Diagnosis not present

## 2014-09-01 DIAGNOSIS — E0500 Thyrotoxicosis with diffuse goiter without thyrotoxic crisis or storm: Secondary | ICD-10-CM

## 2014-09-01 DIAGNOSIS — E559 Vitamin D deficiency, unspecified: Secondary | ICD-10-CM | POA: Diagnosis not present

## 2014-09-01 DIAGNOSIS — K2990 Gastroduodenitis, unspecified, without bleeding: Secondary | ICD-10-CM | POA: Diagnosis not present

## 2014-09-01 DIAGNOSIS — M79609 Pain in unspecified limb: Secondary | ICD-10-CM | POA: Diagnosis not present

## 2014-09-01 DIAGNOSIS — E785 Hyperlipidemia, unspecified: Secondary | ICD-10-CM | POA: Diagnosis not present

## 2014-09-01 LAB — CBC WITH DIFFERENTIAL (CANCER CENTER ONLY)
BASO#: 0 10*3/uL (ref 0.0–0.2)
BASO%: 0.5 % (ref 0.0–2.0)
EOS%: 3.7 % (ref 0.0–7.0)
Eosinophils Absolute: 0.2 10*3/uL (ref 0.0–0.5)
HCT: 42.1 % (ref 34.8–46.6)
HGB: 14 g/dL (ref 11.6–15.9)
LYMPH#: 1.4 10*3/uL (ref 0.9–3.3)
LYMPH%: 22.2 % (ref 14.0–48.0)
MCH: 30.2 pg (ref 26.0–34.0)
MCHC: 33.3 g/dL (ref 32.0–36.0)
MCV: 91 fL (ref 81–101)
MONO#: 0.5 10*3/uL (ref 0.1–0.9)
MONO%: 8.1 % (ref 0.0–13.0)
NEUT#: 4.1 10*3/uL (ref 1.5–6.5)
NEUT%: 65.5 % (ref 39.6–80.0)
Platelets: 175 10*3/uL (ref 145–400)
RBC: 4.63 10*6/uL (ref 3.70–5.32)
RDW: 14.3 % (ref 11.1–15.7)
WBC: 6.2 10*3/uL (ref 3.9–10.0)

## 2014-09-01 LAB — CMP (CANCER CENTER ONLY)
ALBUMIN: 3.4 g/dL (ref 3.3–5.5)
ALT(SGPT): 16 U/L (ref 10–47)
AST: 18 U/L (ref 11–38)
Alkaline Phosphatase: 71 U/L (ref 26–84)
BILIRUBIN TOTAL: 0.5 mg/dL (ref 0.20–1.60)
BUN, Bld: 26 mg/dL — ABNORMAL HIGH (ref 7–22)
CO2: 25 meq/L (ref 18–33)
Calcium: 9.5 mg/dL (ref 8.0–10.3)
Chloride: 108 mEq/L (ref 98–108)
Creat: 1.6 mg/dl — ABNORMAL HIGH (ref 0.6–1.2)
Glucose, Bld: 110 mg/dL (ref 73–118)
Potassium: 3.5 mEq/L (ref 3.3–4.7)
SODIUM: 146 meq/L — AB (ref 128–145)
Total Protein: 7 g/dL (ref 6.4–8.1)

## 2014-09-01 NOTE — Progress Notes (Signed)
[facility]  [phone]) [phone] [phone]) [phone]  ID: Cassidy Ortiz OB: 10/12/1941 MR#: [id number] Patient Care Team: Jackolyn Confer, MD as PCP - General (Internal Medicine)  DIAGNOSIS: 1. 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  2. 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  3. Intermittent iron-deficiency anemia.  INTERVAL HISTORY: Cassidy Ortiz is here today for a follow-up. Her legs are all better. She stopped taking the norvasc and the lymphedema went away. She still has a lot of stress going on at home. She is also worried about an upcoming renal ultrasound.  She did have a right kidney taken out for renal cell carcinoma 6 years ago. She has lost 2 lbs. Her appetite is good and she is trying to stay hydrated. She is still on a lot of medication. She denies fever, chills, n/v, cough, rash, headache, dizziness, SOB, chest pain, palpitations, abdominal pain, constipation, diarrhea, blood in urine or stool. No swelling, tenderness, numbness or tingling in her extremities. She is dues for a mammogram in October.   CURRENT TREATMENT: Observation  REVIEW OF SYSTEMS: All other 10 point review of systems is negative.   PAST MEDICAL HISTORY: Past Medical History  Diagnosis Date  . Vitamin D deficiency   . Mixed hyperlipidemia   . Anxiety state, unspecified   . Depressive disorder, not elsewhere classified   . Obstructive sleep apnea (adult) (pediatric)   . Hypertensive kidney disease, benign   . Cerebral aneurysm, nonruptured 2008    s/p coil and shunt, performed in [place]  . Duodenal ulcer   . Chronic kidney disease, stage III (moderate)   . Acquired cyst of kidney   . Abnormal weight gain   . Fatty liver   . Diverticulosis of colon (without mention of hemorrhage)   . Personal history of colonic polyps 10/22/2002    hyperplastic   . Hypertension   . Duodenal mass   . Baker's cyst of knee     Left  . Edema   . SOB  (shortness of breath)   . Malignant neoplasm of breast (female), unspecified site 2001    right, s/p lumpectomy and XRT  . 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 Unspecified hypothyroidism   . Grave's disease   . Hypothyroid    PAST SURGICAL HISTORY: Past Surgical History  Procedure Laterality Date  . Breast lumpectomy      right  . Partial nephrectomy      right  . Shoulder surgery      left  . Cataract extraction      bilateral  . Coronary stent placement      brain  . Femoral artery repair  2011  . Csf shunt  08/17/2008  . Hemiarthroplasty shoulder fracture      left  . Vaginal delivery      3   FAMILY HISTORY Family History  Problem Relation Age of Onset  . Colon cancer Sister   . Cancer Sister     colon  . Heart disease Father    GYNECOLOGIC HISTORY:  No LMP recorded. Patient is postmenopausal.   SOCIAL HISTORY:  History   Social History  . Marital Status: Married    Spouse Name: N/A    Number of Children: 3  . Years of Education: N/A   Occupational History  . retired    Social History Main Topics  . Smoking status:  Former Smoker -- 1.00 packs/day for 43 years    Types: Cigarettes    Start date: 04/01/1964    Quit date: 11/24/2007  . Smokeless tobacco: Never Used     Comment: quit smoking 7 years ago  . Alcohol Use: 0.6 oz/week    1 Glasses of wine per week     Comment: occasssionally  . Drug Use: No  . Sexual Activity: Not on file   Other Topics Concern  . Not on file   Social History Narrative   Lives in [place] with husband. From [place]. Children live [place] and [place].   Pets - 1 cat in home.      Work - retired Theme park manager, Educational psychologist   Diet - regular, limited calories         ADVANCED DIRECTIVES: <no information>  HEALTH MAINTENANCE: History  Substance Use Topics  . Smoking status: Former Smoker -- 1.00 packs/day for 43 years    Types: Cigarettes    Start date: 04/01/1964    Quit date: 11/24/2007   . Smokeless tobacco: Never Used     Comment: quit smoking 7 years ago  . Alcohol Use: 0.6 oz/week    1 Glasses of wine per week     Comment: occasssionally   Colonoscopy: PAP: Bone density: Lipid panel:  No Known Allergies  Current Outpatient Prescriptions  Medication Sig Dispense Refill  . ALPRAZolam (XANAX) 0.25 MG tablet Take 1 tablet (0.25 mg total) by mouth 3 (three) times daily as needed for anxiety.  60 tablet  4  . aspirin 81 MG tablet Take 81 mg by mouth daily. Takes 2 tablets daily      . atorvastatin (LIPITOR) 40 MG tablet Take 1 tablet (40 mg total) by mouth daily.  90 tablet  3  . carvedilol (COREG) 6.25 MG tablet Take 1 tablet (6.25 mg total) by mouth 2 (two) times daily with a meal.  60 tablet  3  . cloNIDine (CATAPRES) 0.1 MG tablet Take 1 tablet (0.1 mg total) by mouth 3 (three) times daily.  180 tablet  3  . Esomeprazole Magnesium (NEXIUM PO) Take by mouth every morning.       . furosemide (LASIX) 20 MG tablet Take 20 mg by mouth as needed.       . hydrALAZINE (APRESOLINE) 100 MG tablet TAKE 1 TABLET 3 TIMES A DAY  270 tablet  3  . levothyroxine (SYNTHROID) 175 MCG tablet TAKE 1 TABLET DAILY  90 tablet  3  . lisinopril (PRINIVIL,ZESTRIL) 10 MG tablet Take 10 mg by mouth daily.      . mirtazapine (REMERON) 15 MG tablet Take 1 tablet (15 mg total) by mouth at bedtime.  90 tablet  3  . Multiple Vitamin (MULTIVITAMIN) tablet Take 1 tablet by mouth daily.        Marland Kitchen rOPINIRole (REQUIP) 1 MG tablet Take 1 mg by mouth daily as needed.       Marland Kitchen VIIBRYD 40 MG TABS TAKE 1 TABLET DAILY  90 tablet  0  . zolpidem (AMBIEN CR) 6.25 MG CR tablet Take 1 tablet (6.25 mg total) by mouth at bedtime as needed for sleep.  30 tablet  3   No current facility-administered medications for this visit.   OBJECTIVE: Filed Vitals:   09/01/14 1509  BP: 165/62  Pulse: 55  Temp: 98.4 F (36.9 C)  Resp: 14   Body mass index is 35.28 kg/(m^2). ECOG FS:0 - Asymptomatic Ocular: Sclerae  unicteric, pupils equal, round and reactive  to light Ear-nose-throat: Oropharynx clear, dentition fair Lymphatic: No cervical or supraclavicular adenopathy Lungs no rales or rhonchi, good excursion bilaterally Heart regular rate and rhythm, no murmur appreciated Abd soft, nontender, positive bowel sounds MSK no focal spinal tenderness, no joint edema Neuro: non-focal, well-oriented, appropriate affect Breasts: No changes, no lumps, bumps, rash or lymphadenopathy. She is doing weekly self breast exams.   LAB RESULTS:  No results found for this basename: SPEP, UPEP,  kappa and lambda light chains   Lab Results  Component Value Date   WBC 6.2 09/01/2014   NEUTROABS 4.1 09/01/2014   HGB 14.0 09/01/2014   HCT 42.1 09/01/2014   MCV 91 09/01/2014   PLT 175 09/01/2014   No results found for this basename: LABCA2   No components found with this basename: [id number]   No results found for this basename: INR,  in the last 168 hours  STUDIES: No results found.  ASSESSMENT/PLAN: [name] is 73 year old female with a remote history of stage I ductal carcinoma of the right breast and a past history of right renal cell carcinoma. There has been no evidence of recurrence.She is asymptomatic. She has had no chages in her breasts. She has a renal ultrasound next week.  Her lymphedema has improved. Her main issue right now seems to be stress at home. We discussed counseling today. Hopefull she and her husband will try this and things will improve.   I put in an order for her mammogram in October.  We will see her back in 4 months for labs and follow-up.  She knows to call here with any questions or concerns and to go to the ED in the event of an emergency. We can certainly see her back here sooner if need be.   Eliezer Bottom, NP 09/01/2014 4:01 PM

## 2014-09-02 ENCOUNTER — Other Ambulatory Visit: Payer: Self-pay | Admitting: Cardiovascular Disease

## 2014-09-02 LAB — TSH CHCC: TSH: 0.673 m(IU)/L (ref 0.308–3.960)

## 2014-09-02 LAB — IRON AND TIBC CHCC
%SAT: 24 % (ref 21–57)
IRON: 60 ug/dL (ref 41–142)
TIBC: 253 ug/dL (ref 236–444)
UIBC: 193 ug/dL (ref 120–384)

## 2014-09-02 LAB — FERRITIN CHCC: Ferritin: 47 ng/ml (ref 9–269)

## 2014-09-05 ENCOUNTER — Telehealth: Payer: Self-pay | Admitting: *Deleted

## 2014-09-05 NOTE — Telephone Encounter (Addendum)
Message copied by Lenn Sink on Mon Sep 05, 2014  4:02 PM ------      Message from: Burney Gauze R      Created: Mon Sep 05, 2014  7:45 AM       Call - iron and thyroid is ok!!  Laurey Arrow ------Informed pt that iron and thyroid is okay.

## 2014-09-08 ENCOUNTER — Other Ambulatory Visit: Payer: Self-pay

## 2014-09-08 ENCOUNTER — Other Ambulatory Visit: Payer: Self-pay | Admitting: Cardiovascular Disease

## 2014-09-08 MED ORDER — CLONIDINE HCL 0.1 MG PO TABS: 0.1000 mg | ORAL_TABLET | Freq: Three times a day (TID) | ORAL | Status: DC

## 2014-09-13 ENCOUNTER — Other Ambulatory Visit: Payer: Self-pay | Admitting: Family

## 2014-09-13 DIAGNOSIS — Z1231 Encounter for screening mammogram for malignant neoplasm of breast: Secondary | ICD-10-CM

## 2014-09-15 DIAGNOSIS — R809 Proteinuria, unspecified: Secondary | ICD-10-CM | POA: Diagnosis not present

## 2014-09-19 ENCOUNTER — Encounter: Payer: Self-pay | Admitting: Gastroenterology

## 2014-09-19 DIAGNOSIS — R809 Proteinuria, unspecified: Secondary | ICD-10-CM | POA: Diagnosis not present

## 2014-09-19 DIAGNOSIS — N2581 Secondary hyperparathyroidism of renal origin: Secondary | ICD-10-CM | POA: Diagnosis not present

## 2014-09-19 DIAGNOSIS — I10 Essential (primary) hypertension: Secondary | ICD-10-CM | POA: Diagnosis not present

## 2014-09-19 DIAGNOSIS — N183 Chronic kidney disease, stage 3 (moderate): Secondary | ICD-10-CM | POA: Diagnosis not present

## 2014-09-19 DIAGNOSIS — D631 Anemia in chronic kidney disease: Secondary | ICD-10-CM | POA: Diagnosis not present

## 2014-09-19 DIAGNOSIS — I129 Hypertensive chronic kidney disease with stage 1 through stage 4 chronic kidney disease, or unspecified chronic kidney disease: Secondary | ICD-10-CM | POA: Diagnosis not present

## 2014-09-21 ENCOUNTER — Telehealth: Payer: Self-pay | Admitting: Internal Medicine

## 2014-09-21 NOTE — Telephone Encounter (Signed)
Pt came in to make an appt due to having a headace for 5 days. Please advise where to add to schedule/msn

## 2014-09-22 NOTE — Telephone Encounter (Signed)
Please advise 

## 2014-09-22 NOTE — Telephone Encounter (Signed)
We can see her on Saturday

## 2014-09-23 NOTE — Telephone Encounter (Signed)
LMTCB to for possible appt on 10/17.msn

## 2014-09-23 NOTE — Telephone Encounter (Signed)
The patient's husband called and stated her headaches had "gotten better" and she no longer wanted an apt.

## 2014-09-29 ENCOUNTER — Ambulatory Visit
Admission: RE | Admit: 2014-09-29 | Discharge: 2014-09-29 | Disposition: A | Discharge status: Home / Self Care | Payer: Medicare Other | Source: Ambulatory Visit | Attending: Family | Admitting: Family

## 2014-09-29 ENCOUNTER — Other Ambulatory Visit: Payer: Self-pay | Admitting: Hematology & Oncology

## 2014-09-29 DIAGNOSIS — Z1231 Encounter for screening mammogram for malignant neoplasm of breast: Secondary | ICD-10-CM

## 2014-10-27 ENCOUNTER — Other Ambulatory Visit: Payer: Self-pay | Admitting: Internal Medicine

## 2014-10-27 ENCOUNTER — Ambulatory Visit (AMBULATORY_SURGERY_CENTER): Payer: Self-pay | Admitting: *Deleted

## 2014-10-27 VITALS — Ht 67.0 in | Wt 214.0 lb

## 2014-10-27 DIAGNOSIS — K2270 Barrett's esophagus without dysplasia: Secondary | ICD-10-CM

## 2014-10-27 NOTE — Progress Notes (Signed)
Denies allergies to eggs or soy products. Denies complications with sedation or anesthesia. Denies O2 use. Denies use of diet or weight loss medications.  Emmi instructions given for endoscopy.  

## 2014-11-10 ENCOUNTER — Ambulatory Visit (AMBULATORY_SURGERY_CENTER): Payer: Medicare Other | Admitting: Gastroenterology

## 2014-11-10 ENCOUNTER — Encounter: Payer: Self-pay | Admitting: Gastroenterology

## 2014-11-10 VITALS — BP 228/117 | HR 58 | Temp 97.2°F | Resp 14 | Ht 67.0 in | Wt 214.0 lb

## 2014-11-10 DIAGNOSIS — K2270 Barrett's esophagus without dysplasia: Secondary | ICD-10-CM

## 2014-11-10 DIAGNOSIS — K222 Esophageal obstruction: Secondary | ICD-10-CM

## 2014-11-10 DIAGNOSIS — I10 Essential (primary) hypertension: Secondary | ICD-10-CM | POA: Diagnosis not present

## 2014-11-10 DIAGNOSIS — G4733 Obstructive sleep apnea (adult) (pediatric): Secondary | ICD-10-CM | POA: Diagnosis not present

## 2014-11-10 HISTORY — PX: UPPER GASTROINTESTINAL ENDOSCOPY: SHX188

## 2014-11-10 MED ORDER — SODIUM CHLORIDE 0.9 % IV SOLN: 500.0000 mL | INTRAVENOUS | Status: DC

## 2014-11-10 NOTE — Progress Notes (Signed)
Called to room to assist during endoscopic procedure.  Patient ID and intended procedure confirmed with present staff. Received instructions for my participation in the procedure from the performing physician.  

## 2014-11-10 NOTE — Patient Instructions (Signed)
YOU HAD AN ENDOSCOPIC PROCEDURE TODAY AT [facility]: Refer to the procedure report that was given to you for any specific questions about what was found during the examination.  If the procedure report does not answer your questions, please call your gastroenterologist to clarify.  If you requested that your care partner not be given the details of your procedure findings, then the procedure report has been included in a sealed envelope for you to review at your convenience later.  YOU SHOULD EXPECT: Some feelings of bloating in the abdomen. Passage of more gas than usual.  Walking can help get rid of the air that was put into your GI tract during the procedure and reduce the bloating. If you had a lower endoscopy (such as a colonoscopy or flexible sigmoidoscopy) you may notice spotting of blood in your stool or on the toilet paper. If you underwent a bowel prep for your procedure, then you may not have a normal bowel movement for a few days.  DIET: Your first meal following the procedure should be a light meal and then it is ok to progress to your normal diet.  A half-sandwich or bowl of soup is an example of a good first meal.  Heavy or fried foods are harder to digest and may make you feel nauseous or bloated.  Likewise meals heavy in dairy and vegetables can cause extra gas to form and this can also increase the bloating.  Drink plenty of fluids but you should avoid alcoholic beverages for 24 hours.  ACTIVITY: Your care partner should take you home directly after the procedure.  You should plan to take it easy, moving slowly for the rest of the day.  You can resume normal activity the day after the procedure however you should NOT DRIVE or use heavy machinery for 24 hours (because of the sedation medicines used during the test).    SYMPTOMS TO REPORT IMMEDIATELY: A gastroenterologist can be reached at any hour.  During normal business hours, 8:30 AM to 5:00 PM Monday through Friday,  call [phone].  After hours and on weekends, please call the GI answering service at [phone] who will take a message and have the physician on call contact you.    Following upper endoscopy (EGD)  Vomiting of blood or coffee ground material  New chest pain or pain under the shoulder blades  Painful or persistently difficult swallowing  New shortness of breath  Fever of 100F or higher  Black, tarry-looking stools  FOLLOW UP: If any biopsies were taken you will be contacted by phone or by letter within the next 1-3 weeks.  Call your gastroenterologist if you have not heard about the biopsies in 3 weeks.  Our staff will call the home number listed on your records the next business day following your procedure to check on you and address any questions or concerns that you may have at that time regarding the information given to you following your procedure. This is a courtesy call and so if there is no answer at the home number and we have not heard from you through the emergency physician on call, we will assume that you have returned to your regular daily activities without incident.  barretts esophagus information given.  SIGNATURES/CONFIDENTIALITY: You and/or your care partner have signed paperwork which will be entered into your electronic medical record.  These signatures attest to the fact that that the information above on your After Visit Summary has been reviewed  and is understood.  Full responsibility of the confidentiality of this discharge information lies with you and/or your care-partner. 

## 2014-11-10 NOTE — Progress Notes (Signed)
Dr. Deatra Ina advised of pt. Elevated BP.  She has missed her noon dose of Lisinopril.  She states she feels fine but is a little anxious about having Colonoscopy.  Dr. Deatra Ina advised that she could go home and to take missed dose of BP med. Marland Kitchen

## 2014-11-10 NOTE — Progress Notes (Signed)
Patient awakening,vss,report to rn 

## 2014-11-10 NOTE — Op Note (Signed)
[facility]  [address] [place], [address]   ENDOSCOPY PROCEDURE REPORT  PATIENT: Cassidy, Ortiz  MR#: [id number] BIRTHDATE: Apr 11, 1941 , 73  yrs. old GENDER: female ENDOSCOPIST: Inda Castle, MD REFERRED BY: PROCEDURE DATE:  11/10/2014 PROCEDURE:  EGD w/ biopsy ASA CLASS:     Class II INDICATIONS:  history of Barrett's esophagus. MEDICATIONS: Monitored anesthesia care, Propofol 100 mg IV, and Simethicone 40mg  PO TOPICAL ANESTHETIC:  DESCRIPTION OF PROCEDURE: After the risks benefits and alternatives of the procedure were thoroughly explained, informed consent was obtained.  The LB [id number] endoscope was introduced through the mouth and advanced to the second portion of the duodenum , Without limitations.  The instrument was slowly withdrawn as the mucosa was fully examined.    ESOPHAGUS: There was short segment Barrett's esophagus  found at the gastroesophageal junction.  Multiple biopsies were performed using cold forceps.   There was a stricture.   There was a peptic stricture at the gastroesophageal junction.  The stricture was easily traversable.   Except for the findings listed the EGD was otherwise normal.  Retroflexed views revealed no abnormalities. The scope was then withdrawn from the patient and the procedure completed.  COMPLICATIONS: There were no immediate complications.  ENDOSCOPIC IMPRESSION: 1.   There was short segment Barrett's esophagus w/o dysplasia found at the gastroesophageal junction; multiple biopsies were performed 2.   There was a early stricture at the gastroesophageal junction 3.   EGD was otherwise normal  RECOMMENDATIONS: Await pathology results  REPEAT EXAM:  eSigned:  Inda Castle, MD 11/10/2014 2:17 PM    [id number], Anderson Malta MD

## 2014-11-11 ENCOUNTER — Telehealth: Payer: Self-pay

## 2014-11-11 NOTE — Telephone Encounter (Signed)
  Follow up Call-  Call back number 11/10/2014  Post procedure Call Back phone  # [phone]  Permission to leave phone message Yes     Patient questions:  Do you have a fever, pain , or abdominal swelling? No. Pain Score  0 *  Have you tolerated food without any problems? Yes.    Have you been able to return to your normal activities? Yes.    Do you have any questions about your discharge instructions: Diet   No. Medications  No. Follow up visit  No.  Do you have questions or concerns about your Care? No.  Actions: * If pain score is 4 or above: No action needed, pain <4.  No problems per the pt. maw

## 2014-11-14 ENCOUNTER — Ambulatory Visit (AMBULATORY_SURGERY_CENTER): Payer: Self-pay | Admitting: *Deleted

## 2014-11-14 VITALS — Ht 67.0 in | Wt 214.4 lb

## 2014-11-14 DIAGNOSIS — Z86010 Personal history of colonic polyps: Secondary | ICD-10-CM

## 2014-11-14 MED ORDER — MOVIPREP 100 G PO SOLR: 1.0000 | Freq: Once | ORAL | Status: DC

## 2014-11-14 NOTE — Progress Notes (Signed)
No egg or soy allergy. ewm Pt had egd 12-3 with no issues with propofol. ewm No diet pills. ewm No blood thinners. ewm emmi video to e mail. ewm Changed to movi prep due to chronic kidney disease per protocol. ewm Called pharmacy. Pt's prep covered 100% with no cost to pt. Pt notified in office of this. ewm

## 2014-11-16 ENCOUNTER — Encounter: Payer: Self-pay | Admitting: Gastroenterology

## 2014-11-17 ENCOUNTER — Ambulatory Visit (INDEPENDENT_AMBULATORY_CARE_PROVIDER_SITE_OTHER): Payer: Medicare Other | Admitting: Internal Medicine

## 2014-11-17 ENCOUNTER — Encounter: Payer: Self-pay | Admitting: Internal Medicine

## 2014-11-17 VITALS — BP 154/78 | HR 63 | Temp 98.1°F | Ht 65.5 in | Wt 217.5 lb

## 2014-11-17 DIAGNOSIS — I10 Essential (primary) hypertension: Secondary | ICD-10-CM

## 2014-11-17 DIAGNOSIS — G4700 Insomnia, unspecified: Secondary | ICD-10-CM | POA: Diagnosis not present

## 2014-11-17 LAB — COMPREHENSIVE METABOLIC PANEL
ALT: 19 U/L (ref 0–35)
AST: 22 U/L (ref 0–37)
Albumin: 3.7 g/dL (ref 3.5–5.2)
Alkaline Phosphatase: 83 U/L (ref 39–117)
BUN: 28 mg/dL — AB (ref 6–23)
CALCIUM: 9.3 mg/dL (ref 8.4–10.5)
CHLORIDE: 108 meq/L (ref 96–112)
CO2: 25 mEq/L (ref 19–32)
CREATININE: 1.5 mg/dL — AB (ref 0.4–1.2)
GFR: 37.45 mL/min — AB (ref >60.00)
GLUCOSE: 112 mg/dL — AB (ref 70–99)
POTASSIUM: 3.9 meq/L (ref 3.5–5.1)
Sodium: 140 mEq/L (ref 135–145)
Total Bilirubin: 0.4 mg/dL (ref 0.2–1.2)
Total Protein: 6.7 g/dL (ref 6.0–8.3)

## 2014-11-17 NOTE — Progress Notes (Signed)
Pre visit review using our clinic review tool, if applicable. No additional management support is needed unless otherwise documented below in the visit note. 

## 2014-11-17 NOTE — Progress Notes (Signed)
Subjective:    Patient ID: Cassidy Ortiz, female    DOB: [birth year], 73 y.o.   MRN: [id number]  HPI 73YO female presents for follow up.  BP recently elevated after an endoscopy procedure. BP Readings from Last 3 Encounters:  11/17/14 154/78  11/10/14 228/117  09/01/14 165/62   Compliant with medications. No chest pain, headache, palpitations.  Scheduled for colonoscopy tomorrow.  Insomnia - 90% of nights, not sleeping. Sometimes just wakes early at 3-5am. Sometimes cannot fall asleep until morning. Taking Ambien CR 6.25mg  with minimal improvement.  Past medical, surgical, family and social history per today's encounter.  Review of Systems  Constitutional: Negative for fever, chills, appetite change, fatigue and unexpected weight change.  Eyes: Negative for visual disturbance.  Respiratory: Negative for shortness of breath.   Cardiovascular: Negative for chest pain, palpitations and leg swelling.  Gastrointestinal: Negative for nausea, vomiting, abdominal pain, diarrhea and constipation.  Skin: Negative for color change and rash.  Neurological: Negative for headaches.  Hematological: Negative for adenopathy. Does not bruise/bleed easily.  Psychiatric/Behavioral: Positive for sleep disturbance. Negative for dysphoric mood. The patient is not nervous/anxious.        Objective:    BP 154/78 mmHg  Pulse 63  Temp(Src) 98.1 F (36.7 C) (Oral)  Ht 5' 5.5" (1.664 m)  Wt 217 lb 8 oz (98.657 kg)  BMI 35.63 kg/m2  SpO2 93% Physical Exam  Constitutional: She is oriented to person, place, and time. She appears well-developed and well-nourished. No distress.  HENT:  Head: Normocephalic and atraumatic.  Right Ear: External ear normal.  Left Ear: External ear normal.  Nose: Nose normal.  Mouth/Throat: Oropharynx is clear and moist. No oropharyngeal exudate.  Eyes: Conjunctivae are normal. Pupils are equal, round, and reactive to light. Right eye exhibits no discharge. Left eye  exhibits no discharge. No scleral icterus.  Neck: Normal range of motion. Neck supple. No tracheal deviation present. No thyromegaly present.  Cardiovascular: Normal rate, regular rhythm, normal heart sounds and intact distal pulses.  Exam reveals no gallop and no friction rub.   No murmur heard. Pulmonary/Chest: Effort normal and breath sounds normal. No accessory muscle usage. No tachypnea. No respiratory distress. She has no decreased breath sounds. She has no wheezes. She has no rhonchi. She has no rales. She exhibits no tenderness.  Musculoskeletal: Normal range of motion. She exhibits no edema or tenderness.  Lymphadenopathy:    She has no cervical adenopathy.  Neurological: She is alert and oriented to person, place, and time. No cranial nerve deficit. She exhibits normal muscle tone. Coordination normal.  Skin: Skin is warm and dry. No rash noted. She is not diaphoretic. No erythema. No pallor.  Psychiatric: She has a normal mood and affect. Her behavior is normal. Judgment and thought content normal.          Assessment & Plan:   Problem List Items Addressed This Visit      Unprioritized   Hypertension    BP Readings from Last 3 Encounters:  11/17/14 154/78  11/10/14 228/117  09/01/14 165/62   BP recently elevated after a procedure, however now back to her baseline. Systolic slightly high. Will monitor for now. Continue lisinopril, Hydralazine, Clonidine and carvedilol. If BP elevated after procedure later this week, would consider giving extra dose of Clonidine 0.1mg .    Relevant Orders      Comprehensive metabolic panel   Insomnia - Primary    Persistent symptoms of insomnia. Will try increasing the dose of  Ambien CR to 12.5mg  daily. We discussed potential risks with this including sedation, falls, and confusion. She will call with update later this week.        Return in about 3 months (around 02/16/2015) for Recheck.

## 2014-11-17 NOTE — Patient Instructions (Signed)
Try increasing Ambien to 12.5mg  daily at bedtime to help with sleep.  Follow up in 3 months.

## 2014-11-17 NOTE — Assessment & Plan Note (Signed)
Persistent symptoms of insomnia. Will try increasing the dose of Ambien CR to 12.5mg  daily. We discussed potential risks with this including sedation, falls, and confusion. She will call with update later this week.

## 2014-11-17 NOTE — Assessment & Plan Note (Signed)
BP Readings from Last 3 Encounters:  11/17/14 154/78  11/10/14 228/117  09/01/14 165/62   BP recently elevated after a procedure, however now back to her baseline. Systolic slightly high. Will monitor for now. Continue lisinopril, Hydralazine, Clonidine and carvedilol. If BP elevated after procedure later this week, would consider giving extra dose of Clonidine 0.1mg .

## 2014-11-18 ENCOUNTER — Encounter: Payer: Self-pay | Admitting: Gastroenterology

## 2014-11-18 ENCOUNTER — Telehealth: Payer: Self-pay | Admitting: Internal Medicine

## 2014-11-18 ENCOUNTER — Ambulatory Visit (AMBULATORY_SURGERY_CENTER): Payer: Medicare Other | Admitting: Gastroenterology

## 2014-11-18 VITALS — BP 202/120 | HR 58 | Temp 98.4°F | Resp 18 | Ht 67.0 in | Wt 214.0 lb

## 2014-11-18 DIAGNOSIS — Z800 Family history of malignant neoplasm of digestive organs: Secondary | ICD-10-CM

## 2014-11-18 DIAGNOSIS — Z86010 Personal history of colonic polyps: Secondary | ICD-10-CM

## 2014-11-18 DIAGNOSIS — E039 Hypothyroidism, unspecified: Secondary | ICD-10-CM | POA: Diagnosis not present

## 2014-11-18 DIAGNOSIS — G4733 Obstructive sleep apnea (adult) (pediatric): Secondary | ICD-10-CM | POA: Diagnosis not present

## 2014-11-18 DIAGNOSIS — K5730 Diverticulosis of large intestine without perforation or abscess without bleeding: Secondary | ICD-10-CM

## 2014-11-18 DIAGNOSIS — I10 Essential (primary) hypertension: Secondary | ICD-10-CM | POA: Diagnosis not present

## 2014-11-18 DIAGNOSIS — N289 Disorder of kidney and ureter, unspecified: Secondary | ICD-10-CM | POA: Diagnosis not present

## 2014-11-18 DIAGNOSIS — E669 Obesity, unspecified: Secondary | ICD-10-CM | POA: Diagnosis not present

## 2014-11-18 MED ORDER — SODIUM CHLORIDE 0.9 % IV SOLN: 500.0000 mL | INTRAVENOUS | Status: DC

## 2014-11-18 NOTE — Op Note (Signed)
[facility]  [address]. [place], [address]   COLONOSCOPY PROCEDURE REPORT  PATIENT: Cassidy Ortiz, Cassidy Ortiz  MR#: [id number] BIRTHDATE: 08-20-1941 , 73  yrs. old GENDER: female ENDOSCOPIST: Inda Castle, MD REFERRED BY: PROCEDURE DATE:  11/18/2014 PROCEDURE:   Colonoscopy, diagnostic First Screening Colonoscopy - Avg.  risk and is 50 yrs.  old or older - No.  Prior Negative Screening - Now for repeat screening. N/A  History of Adenoma - Now for follow-up colonoscopy & has been > or = to 3 yrs.  Yes hx of adenoma.  Has been 3 or more years since last colonoscopy.  Polyps Removed Today? No.  Recommend repeat exam, <10 yrs? Yes.  High risk (family or personal hx). ASA CLASS:   Class III INDICATIONS:patient's immediate family history of colon cancer and high risk personal history of colonic polyps. MEDICATIONS: Monitored anesthesia care and Propofol 280 mg IV  DESCRIPTION OF PROCEDURE:   After the risks benefits and alternatives of the procedure were thoroughly explained, informed consent was obtained.  The digital rectal exam revealed no abnormalities of the rectum.   The LB [id number]  endoscope was introduced through the anus and advanced to the cecum, which was identified by both the appendix and ileocecal valve. No adverse events experienced.   The quality of the prep was Suprep good  The instrument was then slowly withdrawn as the colon was fully examined.      COLON FINDINGS: There was severe diverticulosis noted in the descending colon and sigmoid colon with associated luminal narrowing, muscular hypertrophy and angulation.   The examination was otherwise normal.  Retroflexed views revealed no abnormalities. The time to cecum=10 minutes 54 seconds.  Withdrawal time=6 minutes 46 seconds.  The scope was withdrawn and the procedure completed. COMPLICATIONS: There were no immediate complications.  ENDOSCOPIC IMPRESSION: 1.   There was severe  diverticulosis noted in the descending colon and sigmoid colon 2.   The examination was otherwise normal  RECOMMENDATIONS: Given your significant family history of colon cancer, you should have a repeat colonoscopy in 5 years  eSigned:  Inda Castle, MD 11/18/2014 3:03 PM   cc: Ronette Deter MD

## 2014-11-18 NOTE — Progress Notes (Addendum)
Pt's BP- 194/115- she didn't take her noon Clonidine 0.1 mg dose.  She has this dose with her  Ok to give per B Geneticist, molecular- given at 1518  1540-  Pt's BP still elevated.  See VS flowsheet.  She denies headache, blurry vision.  Her husband states, "she did this last week when she had her EGD- her BP went up while in the admitting and the RR."  Dr. Deatra Ina is made aware and instructed for pt to go home and take her medications as scheduled.  Pt and husband informed and understanding voiced

## 2014-11-18 NOTE — Patient Instructions (Signed)
YOU HAD AN ENDOSCOPIC PROCEDURE TODAY AT [facility]: Refer to the procedure report that was given to you for any specific questions about what was found during the examination.  If the procedure report does not answer your questions, please call your gastroenterologist to clarify.  If you requested that your care partner not be given the details of your procedure findings, then the procedure report has been included in a sealed envelope for you to review at your convenience later.  YOU SHOULD EXPECT: Some feelings of bloating in the abdomen. Passage of more gas than usual.  Walking can help get rid of the air that was put into your GI tract during the procedure and reduce the bloating. If you had a lower endoscopy (such as a colonoscopy or flexible sigmoidoscopy) you may notice spotting of blood in your stool or on the toilet paper. If you underwent a bowel prep for your procedure, then you may not have a normal bowel movement for a few days.  DIET: Your first meal following the procedure should be a light meal and then it is ok to progress to your normal diet.  A half-sandwich or bowl of soup is an example of a good first meal.  Heavy or fried foods are harder to digest and may make you feel nauseous or bloated.  Likewise meals heavy in dairy and vegetables can cause extra gas to form and this can also increase the bloating.  Drink plenty of fluids but you should avoid alcoholic beverages for 24 hours.  ACTIVITY: Your care partner should take you home directly after the procedure.  You should plan to take it easy, moving slowly for the rest of the day.  You can resume normal activity the day after the procedure however you should NOT DRIVE or use heavy machinery for 24 hours (because of the sedation medicines used during the test).    SYMPTOMS TO REPORT IMMEDIATELY: A gastroenterologist can be reached at any hour.  During normal business hours, 8:30 AM to 5:00 PM Monday through Friday,  call [phone].  After hours and on weekends, please call the GI answering service at [phone] who will take a message and have the physician on call contact you.   Following lower endoscopy (colonoscopy or flexible sigmoidoscopy):  Excessive amounts of blood in the stool  Significant tenderness or worsening of abdominal pains  Swelling of the abdomen that is new, acute  Fever of 100F or higher  FOLLOW UP: Our staff will call the home number listed on your records the next business day following your procedure to check on you and address any questions or concerns that you may have at that time regarding the information given to you following your procedure. This is a courtesy call and so if there is no answer at the home number and we have not heard from you through the emergency physician on call, we will assume that you have returned to your regular daily activities without incident.  SIGNATURES/CONFIDENTIALITY: You and/or your care partner have signed paperwork which will be entered into your electronic medical record.  These signatures attest to the fact that that the information above on your After Visit Summary has been reviewed and is understood.  Full responsibility of the confidentiality of this discharge information lies with you and/or your care-partner.  Please read over handouts about diverticulosis and high fiber diets  Continue your normal medications  Next colonoscopy- 5 years

## 2014-11-18 NOTE — Telephone Encounter (Signed)
emmi emailed °

## 2014-11-18 NOTE — Progress Notes (Signed)
Procedure ends, to recovery, report given and VSS. 

## 2014-11-21 ENCOUNTER — Telehealth: Payer: Self-pay

## 2014-11-21 NOTE — Telephone Encounter (Signed)
  Follow up Call-  Call back number 11/18/2014 11/10/2014  Post procedure Call Back phone  # [phone]  Permission to leave phone message Yes Yes     Patient questions:  Do you have a fever, pain , or abdominal swelling? No. Pain Score  0 *  Have you tolerated food without any problems? Yes.    Have you been able to return to your normal activities? Yes.    Do you have any questions about your discharge instructions: Diet   No. Medications  No. Follow up visit  No.  Do you have questions or concerns about your Care? No.  Actions: * If pain score is 4 or above: No action needed, pain <4.

## 2014-11-22 ENCOUNTER — Ambulatory Visit (INDEPENDENT_AMBULATORY_CARE_PROVIDER_SITE_OTHER): Payer: Medicare Other | Admitting: Cardiovascular Disease

## 2014-11-22 ENCOUNTER — Encounter: Payer: Self-pay | Admitting: Cardiovascular Disease

## 2014-11-22 VITALS — BP 130/80 | HR 50 | Ht 67.0 in | Wt 212.5 lb

## 2014-11-22 DIAGNOSIS — I5031 Acute diastolic (congestive) heart failure: Secondary | ICD-10-CM | POA: Diagnosis not present

## 2014-11-22 DIAGNOSIS — I10 Essential (primary) hypertension: Secondary | ICD-10-CM

## 2014-11-22 NOTE — Patient Instructions (Addendum)
You are doing well. No medication changes were made.  Please monitor your blood pressure at home top 120s up to 150s bottom 40s to 90) Call the office if blood pressure runs high  Please call us if you have new issues that need to be addressed before your next appt.  Your physician wants you to follow-up in: 6 months.  You will receive a reminder letter in the mail two months in advance. If you don't receive a letter, please call our office to schedule the follow-up appointment.

## 2014-11-22 NOTE — Progress Notes (Signed)
Patient ID: Cassidy Ortiz, female    DOB: 23-Mar-1941, 73 y.o.   MRN: [id number]  HPI Comments: 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 She initially presented with lower extremity edema.  Previous  elevated creatinine of 1.5. Edema improved by holding diltiazem She presents today for follow-up of her blood pressure  In follow-up, she reports that she went for EGD on December 3 and had severe hypertension prior to the procedure. EGD showed small region of Barrett's esophagus After she got home, blood pressure seem to improve down to her baseline in the 130 range Went in for colonoscopy 11/18/2014 and again had severe hypertension Again after the procedure, went home and blood pressure significantly improved She does have a blood pressure cuff that she monitors her blood pressures at home and typically her numbers run in a reasonable range and have not been a problem She feels her elevated blood pressure was secondary to anxiety or the stress of the procedure Currently she feels well with no complaints.  Blood work showing total cholesterol 150, LDL 69 EKG shows normal sinus rhythm with no significant ST or T wave changes  Other past medical history Echocardiogram showed severe LVH, mild outflow tract gradient. No mention of elevated right ventricular systolic pressures. Previous  Problems with anemia before requiring IM iron, now resolved  Previous EGD showed  ulcer, H. Pylori negative.  Earlier in 2013, we discontinued the diltiazem and  Her edema resolved.  She reports that in followup today, she is depressed. Recently started on Remeron for sleep over the past several months. Uncertain if this is helping but sleep is okay. She's not doing any exercise. Very sedentary. Feels her blood pressure is doing well but is not checking any numbers. Weight continues to trend upwards  previous assessment/evaluation at [facility] for history of aneurysms and shunt (leak?) showed that shunt is not working but it is not a problem. No recent carotid ultrasound   Allergies  Allergen Reactions  . Amlodipine     Leg swelling    Outpatient Encounter Prescriptions as of 11/22/2014  Medication Sig  . ALPRAZolam (XANAX) 0.25 MG tablet Take 1 tablet (0.25 mg total) by mouth 3 (three) times daily as needed for anxiety.  Marland Kitchen aspirin 81 MG tablet Take 81 mg by mouth daily. Takes 2 tablets daily  . atorvastatin (LIPITOR) 40 MG tablet Take 1 tablet (40 mg total) by mouth daily.  . carvedilol (COREG) 6.25 MG tablet Take 1 tablet (6.25 mg total) by mouth 2 (two) times daily with a meal.  . cloNIDine (CATAPRES) 0.1 MG tablet Take 1 tablet (0.1 mg total) by mouth 3 (three) times daily.  . Esomeprazole Magnesium (NEXIUM PO) Take by mouth every morning.   . hydrALAZINE (APRESOLINE) 100 MG tablet TAKE 1 TABLET 3 TIMES A DAY  . levothyroxine (SYNTHROID) 175 MCG tablet TAKE 1 TABLET DAILY  . lisinopril (PRINIVIL,ZESTRIL) 10 MG tablet Take 10 mg by mouth daily.  . mirtazapine (REMERON) 15 MG tablet Take 1 tablet (15 mg total) by mouth at bedtime.  . Multiple Vitamin (  MULTIVITAMIN) tablet Take 1 tablet by mouth daily.    Marland Kitchen rOPINIRole (REQUIP) 1 MG tablet Take 1 mg by mouth daily as needed.   Marland Kitchen VIIBRYD 40 MG TABS TAKE 1 TABLET DAILY  . zolpidem (AMBIEN CR) 6.25 MG CR tablet Take 1 tablet (6.25 mg total) by mouth at bedtime as needed for sleep.    Past Medical History  Diagnosis Date  . Vitamin D deficiency   . Mixed hyperlipidemia   . Anxiety state, unspecified    . Depressive disorder, not elsewhere classified   . Obstructive sleep apnea (adult) (pediatric)   . Hypertensive kidney disease, benign   . Cerebral aneurysm, nonruptured 2008    s/p coil and shunt, performed in [place]  . Duodenal ulcer   . Chronic kidney disease, stage III (moderate)   . Acquired cyst of kidney   . Abnormal weight gain   . Fatty liver   . Diverticulosis of colon (without mention of hemorrhage)   . Personal history of colonic polyps 10/22/2002    hyperplastic   . Hypertension   . Duodenal mass   . Baker's cyst of knee     Left, pt does not remember  . Edema   . SOB (shortness of breath)   . Malignant neoplasm of breast (female), unspecified site 2001    right, s/p lumpectomy and XRT  . 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 Unspecified hypothyroidism   . Grave's disease   . Hypothyroid   . Sleep apnea     off cpap  . Cerebral aneurysm rupture     stent    Past Surgical History  Procedure Laterality Date  . Breast lumpectomy Right     right  . Partial nephrectomy      right  . Shoulder surgery      left  . Cataract extraction      bilateral  . Coronary stent placement      brain  . Femoral artery repair  2011  . Csf shunt  08/17/2008  . Hemiarthroplasty shoulder fracture      left  . Vaginal delivery      3  . Upper gastrointestinal endoscopy  11-10-2014  . Colonoscopy      last 2012.+ TA polyps    Social History  reports that she quit smoking about 7 years ago. Her smoking use included Cigarettes. She started smoking about 50 years ago. She has a 43 pack-year smoking history. She has never used smokeless tobacco. She reports that she drinks about 0.6 oz of alcohol per week. She reports that she does not use illicit drugs.  Family History family history includes Cancer in her sister; Colon cancer in her sister; Heart disease in her father. There is no history of Esophageal cancer, Rectal cancer, or Stomach  cancer.   Review of Systems  Constitutional: Negative.   Respiratory: Negative.   Cardiovascular: Negative.           Gastrointestinal: Negative.   Endocrine: Negative.   Musculoskeletal: Negative.   Skin: Negative.   Neurological: Negative.   Hematological: Negative.   Psychiatric/Behavioral: The patient is nervous/anxious.   All other systems reviewed and are negative.  BP 130/80 mmHg  Pulse 50  Ht 5\' 7"  (1.702 m)  Wt 212 lb 8 oz (96.389 kg)  BMI 33.27 kg/m2  SpO2 94%  Physical Exam  Constitutional: She is oriented to person, place, and time. She appears well-developed and well-nourished.  HENT:  Head: Normocephalic.  Nose: Nose normal.  Mouth/Throat: Oropharynx is clear and moist.  Eyes: Conjunctivae are normal. Pupils are equal, round, and reactive to light.  Neck: Normal range of motion. Neck supple. No JVD present.  Cardiovascular: Normal rate, regular rhythm, S1 normal, S2 normal, normal heart sounds and intact distal pulses.  Exam reveals no gallop and no friction rub.   No murmur heard. Pulmonary/Chest: Effort normal and breath sounds normal. No respiratory distress. She has no wheezes. She has no rales. She exhibits no tenderness.  Abdominal: Soft. Bowel sounds are normal. She exhibits no distension. There is no tenderness.  Musculoskeletal: Normal range of motion. She exhibits no edema or tenderness.  Lymphadenopathy:    She has no cervical adenopathy.  Neurological: She is alert and oriented to person, place, and time. Coordination normal.  Skin: Skin is warm and dry. No rash noted. No erythema.  Psychiatric: She has a normal mood and affect. Her behavior is normal. Judgment and thought content normal.    Assessment and Plan  Nursing note and vitals reviewed.

## 2014-11-23 NOTE — Assessment & Plan Note (Addendum)
Severe hypertension the setting of her EGD and colonoscopy. Blood pressure well controlled today. Recommended she continue to monitor her blood pressure at home as she has been doing. Typically blood pressure runs in a recent well range with some labile numbers. Most of her visit today was spent discussing her blood pressure numbers in the range of normal

## 2014-11-23 NOTE — Assessment & Plan Note (Signed)
She appears relatively euvolemic on today's visit. No changes to her medications.

## 2014-12-19 ENCOUNTER — Telehealth: Payer: Self-pay | Admitting: Hematology & Oncology

## 2014-12-19 NOTE — Telephone Encounter (Signed)
Pt moved 1-28 to 2-11

## 2014-12-20 DIAGNOSIS — N2581 Secondary hyperparathyroidism of renal origin: Secondary | ICD-10-CM | POA: Diagnosis not present

## 2014-12-20 DIAGNOSIS — N183 Chronic kidney disease, stage 3 (moderate): Secondary | ICD-10-CM | POA: Diagnosis not present

## 2014-12-20 DIAGNOSIS — D631 Anemia in chronic kidney disease: Secondary | ICD-10-CM | POA: Diagnosis not present

## 2014-12-20 DIAGNOSIS — I129 Hypertensive chronic kidney disease with stage 1 through stage 4 chronic kidney disease, or unspecified chronic kidney disease: Secondary | ICD-10-CM | POA: Diagnosis not present

## 2014-12-20 DIAGNOSIS — N189 Chronic kidney disease, unspecified: Secondary | ICD-10-CM | POA: Diagnosis not present

## 2014-12-23 ENCOUNTER — Telehealth: Payer: Self-pay | Admitting: *Deleted

## 2014-12-23 DIAGNOSIS — G4700 Insomnia, unspecified: Secondary | ICD-10-CM

## 2014-12-23 NOTE — Telephone Encounter (Signed)
Pt called requesting an increase on her Ambien.  She states she is still not sleeping well.  Pt is currently taking 6.25 mg.  Please advise

## 2014-12-23 NOTE — Telephone Encounter (Signed)
We can try Ambien CR 12.5mg  daily at bedtime disp 30 with 1 refill.

## 2014-12-26 MED ORDER — ZOLPIDEM TARTRATE ER 12.5 MG PO TBCR: 6.2500 mg | EXTENDED_RELEASE_TABLET | Freq: Every evening | ORAL | Status: DC | PRN

## 2014-12-26 NOTE — Telephone Encounter (Signed)
rx printed

## 2014-12-27 ENCOUNTER — Telehealth: Payer: Self-pay | Admitting: Internal Medicine

## 2014-12-27 DIAGNOSIS — G4700 Insomnia, unspecified: Secondary | ICD-10-CM

## 2014-12-27 MED ORDER — ZOLPIDEM TARTRATE ER 12.5 MG PO TBCR: 6.2500 mg | EXTENDED_RELEASE_TABLET | Freq: Every evening | ORAL | Status: DC | PRN

## 2014-12-27 NOTE — Telephone Encounter (Signed)
Pt needs refill on Zolpidem. Pt has been taking double the dose as advise by Dr. Gilford Rile. Pt stated that taking double is working. Pt only has enough left for one night. Please advise pt/msn

## 2014-12-27 NOTE — Telephone Encounter (Signed)
Please see below.

## 2014-12-27 NOTE — Telephone Encounter (Signed)
I have printed Rx

## 2014-12-27 NOTE — Telephone Encounter (Signed)
Rx sent to pharmacy   

## 2014-12-29 ENCOUNTER — Encounter: Payer: Medicare Other | Admitting: Gastroenterology

## 2015-01-05 ENCOUNTER — Other Ambulatory Visit: Payer: Medicare Other | Admitting: Lab

## 2015-01-05 ENCOUNTER — Ambulatory Visit: Payer: Medicare Other | Admitting: Hematology & Oncology

## 2015-01-13 ENCOUNTER — Ambulatory Visit (INDEPENDENT_AMBULATORY_CARE_PROVIDER_SITE_OTHER): Payer: Medicare Other | Admitting: Nurse Practitioner

## 2015-01-13 ENCOUNTER — Encounter: Payer: Self-pay | Admitting: Nurse Practitioner

## 2015-01-13 VITALS — BP 158/88 | HR 66 | Temp 98.0°F | Resp 16 | Ht 65.0 in | Wt 213.8 lb

## 2015-01-13 DIAGNOSIS — J069 Acute upper respiratory infection, unspecified: Secondary | ICD-10-CM | POA: Diagnosis not present

## 2015-01-13 NOTE — Progress Notes (Signed)
   Subjective:    Patient ID: Cassidy Ortiz, female    DOB: 1941-06-30, 74 y.o.   MRN: [id number]  HPI  Cassidy Ortiz is a 74 yo female with a CC of ear pain, sore throat, and cough x 4 days.   1) Flew from [place] on Sunday, Monday woke up with sore throat, Both ears left ear is greater than the right. Coughing- dry  Hot tea and honey- Not helpful  Pain reliever [facility] brand- Not helpful Gargling with listerine- not helpful   Review of Systems  Constitutional: Positive for chills and fatigue. Negative for fever and diaphoresis.  HENT: Positive for congestion, ear pain, postnasal drip, sneezing, sore throat and trouble swallowing. Negative for sinus pressure.   Respiratory: Positive for cough. Negative for chest tightness, shortness of breath and wheezing.   Cardiovascular: Negative for chest pain, palpitations and leg swelling.  Gastrointestinal: Negative for nausea, vomiting and diarrhea.  Musculoskeletal: Negative for neck pain and neck stiffness.  Skin: Negative for rash.  Neurological: Positive for headaches. Negative for dizziness, weakness and numbness.  Psychiatric/Behavioral: The patient is not nervous/anxious.       Objective:   Physical Exam  Constitutional: She is oriented to person, place, and time. She appears well-developed and well-nourished. No distress.  BP 158/88 mmHg  Pulse 66  Temp(Src) 98 F (36.7 C) (Oral)  Resp 16  Ht 5\' 5"  (1.651 m)  Wt 213 lb 12.8 oz (96.979 kg)  BMI 35.58 kg/m2  SpO2 95%   HENT:  Head: Normocephalic and atraumatic.  Right Ear: External ear normal.  Left Ear: External ear normal.  Mouth/Throat: No oropharyngeal exudate.  TM's clear with hearing aids removed   Cardiovascular: Normal rate, regular rhythm, normal heart sounds and intact distal pulses.  Exam reveals no gallop and no friction rub.   No murmur heard. Pulmonary/Chest: Effort normal and breath sounds normal. No respiratory distress. She has no wheezes. She has no rales. She  exhibits no tenderness.  Neurological: She is alert and oriented to person, place, and time. No cranial nerve deficit. She exhibits normal muscle tone. Coordination normal.  Skin: Skin is warm and dry. No rash noted. She is not diaphoretic.  Psychiatric: She has a normal mood and affect. Her behavior is normal. Judgment and thought content normal.         Assessment & Plan:

## 2015-01-13 NOTE — Patient Instructions (Addendum)
Cepacol will help with you with your sore throat (over the counter).   Benadryl will help at night with your stuff in your throat.   Call us if worsens or fails to improve in 5 days.

## 2015-01-13 NOTE — Progress Notes (Signed)
Pre visit review using our clinic review tool, if applicable. No additional management support is needed unless otherwise documented below in the visit note. 

## 2015-01-16 ENCOUNTER — Telehealth: Payer: Self-pay | Admitting: *Deleted

## 2015-01-16 DIAGNOSIS — J069 Acute upper respiratory infection, unspecified: Secondary | ICD-10-CM | POA: Insufficient documentation

## 2015-01-16 NOTE — Assessment & Plan Note (Signed)
Stable. Probable for viral illness. Will try conservative treatment. Benadry at night for PNDrip, Cepacol lozanges for sore throat, and lots of water. FU prn worsening/failure to improve

## 2015-01-16 NOTE — Telephone Encounter (Signed)
Called to check in with pt, she did not go to ED as advised. States she continues to feel bad, cough, sore throat, shortness of breath. Denies fever and wheezing. States she is just not improving. Advised that with complaints of SOB, recommended she been seen today in ED, pt states she would not go. Offered appt with Dr. Derrel Nip this evening, declined, states she does not feel like leaving the house today. States she will call back tomorrow if she still feels bad.  FYI

## 2015-01-16 NOTE — Telephone Encounter (Signed)
Chief Complaint Sore Throat Initial Comment caller states she has a sore throat. CB to verify she gave [place] a good #. PT sounds out of breath, and says has a bit of difficulty breathing PreDisposition Did not know what to do Nurse Assessment Nurse: Loletta Specter, RN, Wells Guiles Date/Time Eilene Ghazi Time): 01/14/2015 9:37:35 AM Confirm and document reason for call. If symptomatic, describe symptoms. ---Caller saw md yesterday for sore throat, sweating, temp unknown, sob. Has the patient traveled out of the country within the last 30 days? ---Not Applicable Does the patient require triage? ---Yes Related visit to physician within the last 2 weeks? ---No Does the PT have any chronic conditions? (i.e. diabetes, asthma, etc.) ---No Guidelines Guideline Title Affirmed Question Affirmed Notes Nurse Date/Time (Eastern Time) Breathing Difficulty [1] MODERATE difficulty breathing (e.g., speaks in phrases, SOB even at rest, pulse 100-120) AND [2] NEWonset or WORSE than normal Loletta Specter, Automotive engineer 01/14/2015 9:38:50 AM Disp. Time Eilene Ghazi Time) Disposition Final User 01/14/2015 9:22:25 AM Send To Clinical Follow Up Queue [facility] 01/14/2015 9:35:03 AM Send to Urgent Micheal Likens 01/14/2015 9:40:16 AM Go to ED Now Yes Loletta Specter, RN, Romualdo Bolk Understands: Yes Disagree/Comply: Disagree Disagree/Comply Reason: Disagree with instructions Care Advice Given Per Guideline GO TO ED NOW: You need to be seen in the Emergency Department. Go to the ER at ___________ [place] now. Drive carefully. NOTE TO TRIAGER - DRIVING: * Another adult should drive. CARE ADVICE given per Breathing Difficulty (Adult) guideline.

## 2015-01-19 ENCOUNTER — Ambulatory Visit (HOSPITAL_BASED_OUTPATIENT_CLINIC_OR_DEPARTMENT_OTHER): Payer: Medicare Other | Admitting: Hematology & Oncology

## 2015-01-19 ENCOUNTER — Other Ambulatory Visit (HOSPITAL_BASED_OUTPATIENT_CLINIC_OR_DEPARTMENT_OTHER): Payer: Medicare Other | Admitting: Lab

## 2015-01-19 VITALS — BP 189/76 | HR 76 | Temp 98.9°F | Wt 212.0 lb

## 2015-01-19 DIAGNOSIS — R748 Abnormal levels of other serum enzymes: Secondary | ICD-10-CM | POA: Diagnosis not present

## 2015-01-19 DIAGNOSIS — Z853 Personal history of malignant neoplasm of breast: Secondary | ICD-10-CM

## 2015-01-19 DIAGNOSIS — D509 Iron deficiency anemia, unspecified: Secondary | ICD-10-CM

## 2015-01-19 DIAGNOSIS — Z85528 Personal history of other malignant neoplasm of kidney: Secondary | ICD-10-CM

## 2015-01-19 DIAGNOSIS — E0500 Thyrotoxicosis with diffuse goiter without thyrotoxic crisis or storm: Secondary | ICD-10-CM

## 2015-01-19 DIAGNOSIS — R635 Abnormal weight gain: Secondary | ICD-10-CM | POA: Diagnosis not present

## 2015-01-19 DIAGNOSIS — C50912 Malignant neoplasm of unspecified site of left female breast: Secondary | ICD-10-CM

## 2015-01-19 DIAGNOSIS — C50911 Malignant neoplasm of unspecified site of right female breast: Secondary | ICD-10-CM

## 2015-01-19 LAB — CMP (CANCER CENTER ONLY)
ALT(SGPT): 38 U/L (ref 10–47)
AST: 34 U/L (ref 11–38)
Albumin: 3 g/dL — ABNORMAL LOW (ref 3.3–5.5)
Alkaline Phosphatase: 171 U/L — ABNORMAL HIGH (ref 26–84)
BUN: 27 mg/dL — AB (ref 7–22)
CALCIUM: 9.3 mg/dL (ref 8.0–10.3)
CHLORIDE: 107 meq/L (ref 98–108)
CO2: 25 mEq/L (ref 18–33)
Creat: 1.3 mg/dl — ABNORMAL HIGH (ref 0.6–1.2)
GLUCOSE: 144 mg/dL — AB (ref 73–118)
Potassium: 3.7 mEq/L (ref 3.3–4.7)
Sodium: 142 mEq/L (ref 128–145)
Total Bilirubin: 0.4 mg/dl (ref 0.20–1.60)
Total Protein: 7.3 g/dL (ref 6.4–8.1)

## 2015-01-19 LAB — CBC WITH DIFFERENTIAL (CANCER CENTER ONLY)
BASO#: 0 10*3/uL (ref 0.0–0.2)
BASO%: 0.3 % (ref 0.0–2.0)
EOS%: 2.2 % (ref 0.0–7.0)
Eosinophils Absolute: 0.2 10*3/uL (ref 0.0–0.5)
HCT: 38.6 % (ref 34.8–46.6)
HGB: 12.6 g/dL (ref 11.6–15.9)
LYMPH#: 1.7 10*3/uL (ref 0.9–3.3)
LYMPH%: 18.9 % (ref 14.0–48.0)
MCH: 28.9 pg (ref 26.0–34.0)
MCHC: 32.6 g/dL (ref 32.0–36.0)
MCV: 89 fL (ref 81–101)
MONO#: 0.6 10*3/uL (ref 0.1–0.9)
MONO%: 6.6 % (ref 0.0–13.0)
NEUT%: 72 % (ref 39.6–80.0)
NEUTROS ABS: 6.4 10*3/uL (ref 1.5–6.5)
PLATELETS: 245 10*3/uL (ref 145–400)
RBC: 4.36 10*6/uL (ref 3.70–5.32)
RDW: 13.9 % (ref 11.1–15.7)
WBC: 8.8 10*3/uL (ref 3.9–10.0)

## 2015-01-19 NOTE — Progress Notes (Signed)
Hematology and Oncology Follow Up Visit  Kwanna Freehill [id number] 1941/11/06 74 y.o. 01/19/2015   Principle Diagnosis:  1. 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 2. 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 3. Intermittent iron-deficiency anemia.  Current Therapy:    observation     Interim History:  Ms.  Mcglasson is back for followup. She is doing pretty well today. We last saw her back in September.  She's worried about weight gain. She's not able lose any weight.  She is off sore her medications. She does not have anymore leg swelling. She's having about this.  There is still some discord with her marriage. Her husband did not come today.  She talked to me about her cat who had hyperthyroidism. She 5 got her husband to allow her to take the cat to that with a diagnosed her.  She's had no problems with abdominal pain. There's been no change in bowel or bladder habits. She's had no fever, sweats or chills. She's had no rashes.  There's been no cough. She's had no shortness of breath. She's had no bony type pain.  I think her family doctor is monitoring her thyroid function.    Medications:  Current outpatient prescriptions:  .  ALPRAZolam (XANAX) 0.25 MG tablet, Take 1 tablet (0.25 mg total) by mouth 3 (three) times daily as needed for anxiety., Disp: 60 tablet, Rfl: 4 .  aspirin 81 MG tablet, Take 81 mg by mouth daily. Takes 2 tablets daily, Disp: , Rfl:  .  atorvastatin (LIPITOR) 40 MG tablet, Take 1 tablet (40 mg total) by mouth daily., Disp: 90 tablet, Rfl: 3 .  carvedilol (COREG) 6.25 MG tablet, Take 1 tablet (6.25 mg total) by mouth 2 (two) times daily with a meal., Disp: 60 tablet, Rfl: 3 .  cloNIDine (CATAPRES) 0.1 MG tablet, Take 1 tablet (0.1 mg total) by mouth 3 (three) times daily., Disp: 270 tablet, Rfl: 3 .  Esomeprazole Magnesium (NEXIUM PO), Take by mouth every morning. , Disp: , Rfl:  .  hydrALAZINE (APRESOLINE) 100 MG  tablet, TAKE 1 TABLET 3 TIMES A DAY, Disp: 270 tablet, Rfl: 3 .  levothyroxine (SYNTHROID) 175 MCG tablet, TAKE 1 TABLET DAILY, Disp: 90 tablet, Rfl: 3 .  lisinopril (PRINIVIL,ZESTRIL) 10 MG tablet, Take 10 mg by mouth daily., Disp: , Rfl:  .  mirtazapine (REMERON) 15 MG tablet, Take 1 tablet (15 mg total) by mouth at bedtime., Disp: 90 tablet, Rfl: 3 .  Multiple Vitamin (MULTIVITAMIN) tablet, Take 1 tablet by mouth daily.  , Disp: , Rfl:  .  rOPINIRole (REQUIP) 1 MG tablet, Take 1 mg by mouth daily as needed. , Disp: , Rfl:  .  VIIBRYD 40 MG TABS, TAKE 1 TABLET DAILY, Disp: 90 tablet, Rfl: 0 .  zolpidem (AMBIEN CR) 12.5 MG CR tablet, Take 1 tablet (12.5 mg total) by mouth at bedtime as needed for sleep., Disp: 30 tablet, Rfl: 1  Allergies:  Allergies  Allergen Reactions  . Amlodipine     Leg swelling    Past Medical History, Surgical history, Social history, and Family History were reviewed and updated.  Review of Systems: As above  Physical Exam:  weight is 212 lb (96.163 kg). Her temperature is 98.9 F (37.2 C). Her blood pressure is 189/76 and her pulse is 76.   Well-developed and well-nourished white female. Head and neck exam shows no ocular or oral lesions. She has no palpable cervical  or supraclavicular lymph nodes. Lungs are clear. Cardiac exam regular in rhythm with no murmurs rubs or bruits. Abdomen is soft. She is good bowel sounds. There is no fluid wave. There is no guarding or rebound tenderness. There is no palpable liver or spleen tip.extremities shows the compression stockings bilaterally. She has minimal edema in her legs. No erythema is noted. She good range of motion and strength. Skin exam no rashes. Neurological exam is non-focal.breast exam shows left breast no masses edema or erythema. There is no left axillary adenopathy. Right breast shows contraction of the tissue. She has the nipple inversion which is chronic. She has some radiation changes. She has no obvious  mass in the right breast. There is no right axillary adenopathy.   Lab Results  Component Value Date   WBC 8.8 01/19/2015   HGB 12.6 01/19/2015   HCT 38.6 01/19/2015   MCV 89 01/19/2015   PLT 245 01/19/2015     Chemistry      Component Value Date/Time   NA 142 01/19/2015 1320   NA 140 11/17/2014 0954   NA 142 07/06/2012 1153   K 3.7 01/19/2015 1320   K 3.9 11/17/2014 0954   CL 107 01/19/2015 1320   CL 108 11/17/2014 0954   CO2 25 01/19/2015 1320   CO2 25 11/17/2014 0954   BUN 27* 01/19/2015 1320   BUN 28* 11/17/2014 0954   BUN 24 07/06/2012 1153   CREATININE 1.3* 01/19/2015 1320   CREATININE 1.5* 11/17/2014 0954      Component Value Date/Time   CALCIUM 9.3 01/19/2015 1320   CALCIUM 9.3 11/17/2014 0954   ALKPHOS 171* 01/19/2015 1320   ALKPHOS 83 11/17/2014 0954   AST 34 01/19/2015 1320   AST 22 11/17/2014 0954   ALT 38 01/19/2015 1320   ALT 19 11/17/2014 0954   BILITOT 0.40 01/19/2015 1320   BILITOT 0.4 11/17/2014 0954         Impression and Plan: Ms. Debona is 74 year old female. She has remote history of stage I ductal carcinoma of the right breast.she has a past history of right renal cell carcinoma. Again I don't think these are a problem.   I'm not sure as to why her alkaline phosphatase is elevated. Her gallbladder is still in. She's having no symptoms from this. She's really not on any new medications.  I will like to see her back in another couple months or so just to follow-up with her labs.  Volanda Napoleon, MD 2/11/20163:03 PM

## 2015-01-20 LAB — IRON AND TIBC CHCC
%SAT: 17 % — AB (ref 21–57)
Iron: 36 ug/dL — ABNORMAL LOW (ref 41–142)
TIBC: 216 ug/dL — AB (ref 236–444)
UIBC: 181 ug/dL (ref 120–384)

## 2015-01-20 LAB — TSH CHCC: TSH: 1.822 m[IU]/L (ref 0.308–3.960)

## 2015-01-20 LAB — FERRITIN CHCC: Ferritin: 122 ng/ml (ref 9–269)

## 2015-01-23 ENCOUNTER — Ambulatory Visit (INDEPENDENT_AMBULATORY_CARE_PROVIDER_SITE_OTHER): Payer: Medicare Other | Admitting: Nurse Practitioner

## 2015-01-23 ENCOUNTER — Encounter: Payer: Self-pay | Admitting: Nurse Practitioner

## 2015-01-23 VITALS — BP 160/78 | HR 68 | Temp 98.5°F | Resp 14 | Ht 65.0 in | Wt 213.4 lb

## 2015-01-23 DIAGNOSIS — H6592 Unspecified nonsuppurative otitis media, left ear: Secondary | ICD-10-CM | POA: Diagnosis not present

## 2015-01-23 DIAGNOSIS — H6590 Unspecified nonsuppurative otitis media, unspecified ear: Secondary | ICD-10-CM | POA: Insufficient documentation

## 2015-01-23 NOTE — Assessment & Plan Note (Signed)
Stable. We discussed different avenues of treatment such as decongestants ect... And she would prefer an ENT referral. Her husband sees Dr. Richardson Landry already. Will follow.

## 2015-01-23 NOTE — Patient Instructions (Signed)

## 2015-01-23 NOTE — Progress Notes (Signed)
   Subjective:    Patient ID: Cassidy Ortiz, female    DOB: 1941-07-02, 74 y.o.   MRN: [id number]  HPI  [name] is a 74 yo female with a CC of left ear problem with buzzing sounds and pressure x 2 days.   1) Feels clogged, poor hearing out of left ear, and hears a hum.  Vinegar and water 10 minutes and lavage with a syringe at home- nothing came out  S/p upper respiratory viral infection on 01/13/15   Review of Systems  Constitutional: Negative for fever, chills, diaphoresis and fatigue.  HENT: Negative for ear discharge and ear pain.        Achy left ear  Respiratory: Negative for chest tightness, shortness of breath and wheezing.   Cardiovascular: Negative for chest pain, palpitations and leg swelling.  Gastrointestinal: Negative for nausea, vomiting, diarrhea and rectal pain.  Skin: Negative for rash.  Neurological: Negative for dizziness, weakness, numbness and headaches.  Psychiatric/Behavioral: The patient is not nervous/anxious.        Objective:   Physical Exam  Constitutional: She is oriented to person, place, and time. She appears well-developed and well-nourished. No distress.  BP 160/78 mmHg  Pulse 68  Temp(Src) 98.5 F (36.9 C) (Oral)  Resp 14  Ht 5\' 5"  (1.651 m)  Wt 213 lb 6.4 oz (96.798 kg)  BMI 35.51 kg/m2  SpO2 98%  Repeat BP was 138/72   HENT:  Head: Normocephalic and atraumatic.  Right Ear: External ear normal.  Left Ear: External ear normal.  Mouth/Throat: No oropharyngeal exudate.  Right TM visible landmarks pearly grey Left TM injected, landmarks visible, bubbles seen behind membrane   Eyes: Right eye exhibits no discharge. Left eye exhibits no discharge. No scleral icterus.  Cardiovascular: Normal rate, regular rhythm, normal heart sounds and intact distal pulses.  Exam reveals no gallop and no friction rub.   No murmur heard. Pulmonary/Chest: Effort normal and breath sounds normal. No respiratory distress. She has no wheezes. She has no rales.  She exhibits no tenderness.  Neurological: She is alert and oriented to person, place, and time. No cranial nerve deficit. She exhibits normal muscle tone. Coordination normal.  Skin: Skin is warm and dry. No rash noted. She is not diaphoretic.  Psychiatric: She has a normal mood and affect. Her behavior is normal. Judgment and thought content normal.      Assessment & Plan:

## 2015-01-23 NOTE — Progress Notes (Signed)
Pre visit review using our clinic review tool, if applicable. No additional management support is needed unless otherwise documented below in the visit note. 

## 2015-01-24 ENCOUNTER — Other Ambulatory Visit: Payer: Self-pay | Admitting: Internal Medicine

## 2015-01-24 ENCOUNTER — Other Ambulatory Visit: Payer: Self-pay | Admitting: *Deleted

## 2015-01-26 ENCOUNTER — Other Ambulatory Visit: Payer: Self-pay | Admitting: Internal Medicine

## 2015-01-26 DIAGNOSIS — H908 Mixed conductive and sensorineural hearing loss, unspecified: Secondary | ICD-10-CM | POA: Diagnosis not present

## 2015-01-26 DIAGNOSIS — H6502 Acute serous otitis media, left ear: Secondary | ICD-10-CM | POA: Diagnosis not present

## 2015-01-26 DIAGNOSIS — H6980 Other specified disorders of Eustachian tube, unspecified ear: Secondary | ICD-10-CM | POA: Diagnosis not present

## 2015-02-10 DIAGNOSIS — H903 Sensorineural hearing loss, bilateral: Secondary | ICD-10-CM | POA: Diagnosis not present

## 2015-02-10 DIAGNOSIS — H9071 Mixed conductive and sensorineural hearing loss, unilateral, right ear, with unrestricted hearing on the contralateral side: Secondary | ICD-10-CM | POA: Diagnosis not present

## 2015-02-16 ENCOUNTER — Ambulatory Visit (INDEPENDENT_AMBULATORY_CARE_PROVIDER_SITE_OTHER): Payer: Medicare Other | Admitting: Internal Medicine

## 2015-02-16 ENCOUNTER — Encounter: Payer: Self-pay | Admitting: Internal Medicine

## 2015-02-16 VITALS — BP 122/76 | HR 87 | Resp 14 | Ht 65.0 in | Wt 209.6 lb

## 2015-02-16 DIAGNOSIS — G4700 Insomnia, unspecified: Secondary | ICD-10-CM | POA: Diagnosis not present

## 2015-02-16 DIAGNOSIS — R32 Unspecified urinary incontinence: Secondary | ICD-10-CM | POA: Diagnosis not present

## 2015-02-16 DIAGNOSIS — F329 Major depressive disorder, single episode, unspecified: Secondary | ICD-10-CM

## 2015-02-16 DIAGNOSIS — F32A Depression, unspecified: Secondary | ICD-10-CM

## 2015-02-16 MED ORDER — ZOLPIDEM TARTRATE 10 MG PO TABS
10.0000 mg | ORAL_TABLET | Freq: Every evening | ORAL | Status: DC | PRN
Start: 2015-02-16 — End: 2015-03-18

## 2015-02-16 NOTE — Patient Instructions (Signed)
We will change Ambien back to 10mg  daily, short acting formulation.  We will set up evaluation with Dr. Erlene Quan in Urology.   Follow up 3 months.

## 2015-02-16 NOTE — Assessment & Plan Note (Signed)
Increased drowsiness on long-acting Ambien. Will change back to short acting Ambien. Follow up in 88months and prn.

## 2015-02-16 NOTE — Assessment & Plan Note (Signed)
Symptoms recently well controlled. Continue Viibryd.

## 2015-02-16 NOTE — Assessment & Plan Note (Signed)
Mixed stress and urge incontinence. Will set up evaluation with Urology. She does not want to try medication because of potential side effects, but willing to consider surgical intervention.

## 2015-02-16 NOTE — Progress Notes (Signed)
Subjective:    Patient ID: Cassidy Ortiz, female    DOB: 22-Sep-1941, 74 y.o.   MRN: [id number]  HPI  74YO female presents for follow up.  Recently seen at [facility] for ear infection. Started on Prednisone with some improvement. Felt tired on prednisone. Ear pain is better and exam was improved on recheck.  Insomnia - When taking Ambien, has trouble waking the next day. Would like to go back to short acting version.  Depression - Symptoms have been well controlled.  Urinary incontinence - Has some leakage during daytime with occasional urgency. Ongoing for years. Does not want to take medication for this because of side effects. No hematuria, dysuria, flank pain.  Past medical, surgical, family and social history per today's encounter.  Review of Systems  Constitutional: Negative for fever, chills, appetite change, fatigue and unexpected weight change.  Eyes: Negative for visual disturbance.  Respiratory: Negative for shortness of breath.   Cardiovascular: Negative for chest pain and leg swelling.  Gastrointestinal: Negative for abdominal pain, diarrhea and constipation.  Genitourinary: Positive for urgency. Negative for dysuria, frequency, flank pain, decreased urine volume and difficulty urinating.  Skin: Negative for color change and rash.  Hematological: Negative for adenopathy. Does not bruise/bleed easily.  Psychiatric/Behavioral: Positive for sleep disturbance. Negative for suicidal ideas and dysphoric mood. The patient is not nervous/anxious.        Objective:    BP 122/76 mmHg  Pulse 87  Resp 14  Ht 5\' 5"  (1.651 m)  Wt 209 lb 9.6 oz (95.074 kg)  BMI 34.88 kg/m2  SpO2 95% Physical Exam  Constitutional: She is oriented to person, place, and time. She appears well-developed and well-nourished. No distress.  HENT:  Head: Normocephalic and atraumatic.  Right Ear: External ear normal.  Left Ear: External ear normal.  Nose: Nose normal.  Mouth/Throat: Oropharynx is  clear and moist.  Eyes: Conjunctivae are normal. Pupils are equal, round, and reactive to light. Right eye exhibits no discharge. Left eye exhibits no discharge. No scleral icterus.  Neck: Normal range of motion. Neck supple. No tracheal deviation present. No thyromegaly present.  Cardiovascular: Normal rate, regular rhythm, normal heart sounds and intact distal pulses.  Exam reveals no gallop and no friction rub.   No murmur heard. Pulmonary/Chest: Effort normal and breath sounds normal. No respiratory distress. She has no wheezes. She has no rales. She exhibits no tenderness.  Musculoskeletal: Normal range of motion. She exhibits no edema or tenderness.  Lymphadenopathy:    She has no cervical adenopathy.  Neurological: She is alert and oriented to person, place, and time. No cranial nerve deficit. She exhibits normal muscle tone. Coordination normal.  Skin: Skin is warm and dry. No rash noted. She is not diaphoretic. No erythema. No pallor.  Psychiatric: She has a normal mood and affect. Her speech is normal and behavior is normal. Judgment and thought content normal. She expresses no suicidal ideation.          Assessment & Plan:   Problem List Items Addressed This Visit      Unprioritized   Absence of bladder continence    Mixed stress and urge incontinence. Will set up evaluation with Urology. She does not want to try medication because of potential side effects, but willing to consider surgical intervention.      Relevant Orders   Ambulatory referral to Urology   Depression    Symptoms recently well controlled. Continue Viibryd.      Insomnia - Primary  Increased drowsiness on long-acting Ambien. Will change back to short acting Ambien. Follow up in 49months and prn.          Return in about 3 months (around 05/19/2015) for Recheck.

## 2015-02-16 NOTE — Progress Notes (Signed)
Pre visit review using our clinic review tool, if applicable. No additional management support is needed unless otherwise documented below in the visit note. 

## 2015-03-06 ENCOUNTER — Other Ambulatory Visit: Payer: Self-pay | Admitting: Internal Medicine

## 2015-03-08 ENCOUNTER — Other Ambulatory Visit: Payer: Self-pay | Admitting: *Deleted

## 2015-03-08 MED ORDER — MIRTAZAPINE 7.5 MG PO TABS: ORAL_TABLET | ORAL | Status: DC

## 2015-03-15 ENCOUNTER — Telehealth: Payer: Self-pay | Admitting: Internal Medicine

## 2015-03-15 NOTE — Telephone Encounter (Signed)
[facility] Patient Name: Cassidy Ortiz DOB: 12-Aug-1941 Initial Comment Caller states she is having an anxiety attack. Nurse Assessment Nurse: Martyn Ehrich, RN, Felicia Date/Time (Eastern Time): 03/15/2015 5:02:30 PM Confirm and document reason for call. If symptomatic, describe symptoms. ---Pt has been having these episodes of anxiety in her chest for a week - xanax Has the patient traveled out of the country within the last 30 days? ---No Does the patient require triage? ---Yes Related visit to physician within the last 2 weeks? ---No Does the PT have any chronic conditions? (i.e. diabetes, asthma, etc.) ---NoGuidelines Guideline Title Affirmed Question Affirmed Notes Chest Pain [1] Chest pain lasts > 5 minutes AND [2] described as crushing, pressure-like, or heavy Eye Pain [1] Painful rash near eye AND [2] multiple small blisters grouped together Final Disposition User Call EMS 911 Now Gaddy, RN, Solmon Ice Comments CALLER DECLINED TO CALL 911- SHE WANTS AN APPOINTMENT TOMORROW - TOLD HER SOMEONE WILL CALL HER BACK FROM THE OFFICE she disagreed with disposition Now she also says the corner of her eye feels sore. No fever. Eyes are puffy with blisters around her eye now she says no blisters else where - no pus and no fever after record was faxed caller had another symptoms - blisters arund her eyes - that was triaged. SHE REFUSED TO GO TO [facility] TODAY -WANTS APPOINTMENT TOMORROW 911 WAS FINAL DISPOSITION THOUGH SEE ABOVE

## 2015-03-15 NOTE — Telephone Encounter (Signed)
[facility] Patient Name: Cassidy Ortiz DOB: February 21, 1941 Initial Comment Caller states she is having an anxiety attack. Nurse Assessment Nurse: Martyn Ehrich, RN, Felicia Date/Time (Eastern Time): 03/15/2015 5:02:30 PM Confirm and document reason for call. If symptomatic, describe symptoms. ---Pt has been having these episodes of anxiety in her chest for a week - xanax Has the patient traveled out of the country within the last 30 days? ---No Does the patient require triage? ---Yes Related visit to physician within the last 2 weeks? ---NoDoes the PT have any chronic conditions? (i.e. diabetes, asthma, etc.) ---No Guidelines Guideline Title Affirmed Question Affirmed Notes Chest Pain [1] Chest pain lasts > 5 minutes AND [2] described as crushing, pressure-like, or heavy Final Disposition User Call EMS 911 Now Gaddy, RN, Felicia Comments CALLER DECLINED TO CALL 911- SHE WANTS AN APPOINTMENT TOMORROW - TOLD HER SOMEONE WILL CALL HER BACK FROM THE OFFICE Call Id: [id number]

## 2015-03-16 ENCOUNTER — Ambulatory Visit (INDEPENDENT_AMBULATORY_CARE_PROVIDER_SITE_OTHER): Payer: Medicare Other | Admitting: Internal Medicine

## 2015-03-16 ENCOUNTER — Encounter: Payer: Self-pay | Admitting: Internal Medicine

## 2015-03-16 ENCOUNTER — Ambulatory Visit: Payer: Medicare Other | Admitting: Internal Medicine

## 2015-03-16 VITALS — BP 145/75 | HR 69 | Temp 98.6°F | Ht 65.0 in | Wt 215.0 lb

## 2015-03-16 DIAGNOSIS — F332 Major depressive disorder, recurrent severe without psychotic features: Secondary | ICD-10-CM | POA: Diagnosis not present

## 2015-03-16 DIAGNOSIS — E669 Obesity, unspecified: Secondary | ICD-10-CM | POA: Diagnosis not present

## 2015-03-16 DIAGNOSIS — N8110 Cystocele, unspecified: Secondary | ICD-10-CM | POA: Diagnosis not present

## 2015-03-16 DIAGNOSIS — I10 Essential (primary) hypertension: Secondary | ICD-10-CM | POA: Diagnosis not present

## 2015-03-16 DIAGNOSIS — R32 Unspecified urinary incontinence: Secondary | ICD-10-CM | POA: Diagnosis not present

## 2015-03-16 DIAGNOSIS — N952 Postmenopausal atrophic vaginitis: Secondary | ICD-10-CM | POA: Diagnosis not present

## 2015-03-16 DIAGNOSIS — R0789 Other chest pain: Secondary | ICD-10-CM | POA: Diagnosis not present

## 2015-03-16 MED ORDER — ARIPIPRAZOLE 2 MG PO TABS: 2.0000 mg | ORAL_TABLET | Freq: Every day | ORAL | Status: DC

## 2015-03-16 MED ORDER — ALPRAZOLAM 0.25 MG PO TABS: 0.2500 mg | ORAL_TABLET | Freq: Three times a day (TID) | ORAL | Status: DC | PRN

## 2015-03-16 NOTE — Assessment & Plan Note (Signed)
Recurrent severe depression with anxiety. Will add Abilify starting at 2mg  daily. Continue Viibryd. Continue prn alprazolam, however we discussed limiting use. Will set up evaluation with Dr. Nicolasa Ducking. Follow up in 2 weeks.

## 2015-03-16 NOTE — Telephone Encounter (Signed)
I would recommend ER evaluation given "chest pressure."

## 2015-03-16 NOTE — Telephone Encounter (Signed)
FYI, pt offered 10:30am appoint today by Almyra Free; pt declined appoint

## 2015-03-16 NOTE — Progress Notes (Signed)
Subjective:    Patient ID: Cassidy Ortiz, female    DOB: [birth year], 74 y.o.   MRN: [id number]  HPI 74YO female presents for acute visit for anxiety and chest pressure.  Chest pressure and anxiety- over last 1-2 weeks. Last about 2hr. No dyspnea. Just feels heaviness over chest. No diaphoresis or nausea. No palpitations. Took Xanax with some improvement. Depression seems worse. Goes days without coming out of room. Generalize dislike of things and some apathy. No interested in eating.  Past medical, surgical, family and social history per today's encounter.  Review of Systems  Constitutional: Negative for fever, chills, appetite change, fatigue and unexpected weight change.  Eyes: Negative for visual disturbance.  Respiratory: Positive for chest tightness. Negative for shortness of breath.   Cardiovascular: Positive for chest pain. Negative for leg swelling.  Gastrointestinal: Negative for abdominal pain.  Skin: Negative for color change and rash.  Hematological: Negative for adenopathy. Does not bruise/bleed easily.  Psychiatric/Behavioral: Positive for behavioral problems, sleep disturbance, dysphoric mood and decreased concentration. Negative for suicidal ideas, hallucinations and self-injury. The patient is nervous/anxious.        Objective:    BP 145/75 mmHg  Pulse 69  Temp(Src) 98.6 F (37 C) (Oral)  Ht 5\' 5"  (1.651 m)  Wt 215 lb (97.523 kg)  BMI 35.78 kg/m2  SpO2 91% Physical Exam  Constitutional: She is oriented to person, place, and time. She appears well-developed and well-nourished. No distress.  HENT:  Head: Normocephalic and atraumatic.  Right Ear: External ear normal.  Left Ear: External ear normal.  Nose: Nose normal.  Mouth/Throat: Oropharynx is clear and moist. No oropharyngeal exudate.  Eyes: Conjunctivae are normal. Pupils are equal, round, and reactive to light. Right eye exhibits no discharge. Left eye exhibits no discharge. No scleral icterus.  Neck:  Normal range of motion. Neck supple. No tracheal deviation present. No thyromegaly present.  Cardiovascular: Normal rate, regular rhythm, normal heart sounds and intact distal pulses.  Exam reveals no gallop and no friction rub.   No murmur heard. Pulmonary/Chest: Effort normal and breath sounds normal. No respiratory distress. She has no wheezes. She has no rales. She exhibits no tenderness.  Musculoskeletal: Normal range of motion. She exhibits no edema or tenderness.  Lymphadenopathy:    She has no cervical adenopathy.  Neurological: She is alert and oriented to person, place, and time. No cranial nerve deficit. She exhibits normal muscle tone. Coordination normal.  Skin: Skin is warm and dry. No rash noted. She is not diaphoretic. No erythema. No pallor.  Psychiatric: Her speech is normal and behavior is normal. Judgment and thought content normal. Her mood appears anxious. Cognition and memory are normal. She exhibits a depressed mood. She expresses no suicidal ideation.          Assessment & Plan:   Problem List Items Addressed This Visit      Unprioritized   Chest pressure    Chest pressure with episodes of anxiety. Symptoms most consistent with anxiety/depression, as noted. EKG today showed non-specific changes. Will make sure she has follow up with cardiology. Follow up here in 2 weeks..      Relevant Orders   EKG 12-Lead (Completed)   Major depressive disorder, recurrent, severe without psychotic features - Primary    Recurrent severe depression with anxiety. Will add Abilify starting at 2mg  daily. Continue Viibryd. Continue prn alprazolam, however we discussed limiting use. Will set up evaluation with Dr. Nicolasa Ducking. Follow up in 2 weeks.  Relevant Medications   ARIPiprazole (ABILIFY) tablet   ALPRAZolam  Duanne Moron) tablet   Other Relevant Orders   Ambulatory referral to Psychiatry       Return in about 2 weeks (around 03/30/2015) for Recheck.

## 2015-03-16 NOTE — Patient Instructions (Signed)
Start Abilify 2mg  daily.   Continue Viibryd. Continue Xanax as needed.  We will set up evaluation with Dr. Nicolasa Ducking.

## 2015-03-16 NOTE — Assessment & Plan Note (Addendum)
Chest pressure with episodes of anxiety. Symptoms most consistent with anxiety/depression, as noted. EKG today showed non-specific changes. Will make sure she has follow up with cardiology. Follow up here in 2 weeks.Marland Kitchen

## 2015-03-16 NOTE — Telephone Encounter (Signed)
Per Dr Gilford Rile put pt at 1:15 today, already notified pt

## 2015-03-16 NOTE — Progress Notes (Signed)
Pre visit review using our clinic review tool, if applicable. No additional management support is needed unless otherwise documented below in the visit note. 

## 2015-03-24 ENCOUNTER — Ambulatory Visit: Payer: Medicare Other | Admitting: Cardiovascular Disease

## 2015-03-30 ENCOUNTER — Ambulatory Visit (INDEPENDENT_AMBULATORY_CARE_PROVIDER_SITE_OTHER): Payer: Medicare Other | Admitting: Internal Medicine

## 2015-03-30 ENCOUNTER — Encounter: Payer: Self-pay | Admitting: Internal Medicine

## 2015-03-30 VITALS — BP 130/66 | HR 74 | Resp 14 | Ht 65.0 in | Wt 215.6 lb

## 2015-03-30 DIAGNOSIS — E669 Obesity, unspecified: Secondary | ICD-10-CM | POA: Diagnosis not present

## 2015-03-30 DIAGNOSIS — F332 Major depressive disorder, recurrent severe without psychotic features: Secondary | ICD-10-CM

## 2015-03-30 DIAGNOSIS — N952 Postmenopausal atrophic vaginitis: Secondary | ICD-10-CM | POA: Diagnosis not present

## 2015-03-30 DIAGNOSIS — R32 Unspecified urinary incontinence: Secondary | ICD-10-CM

## 2015-03-30 DIAGNOSIS — I10 Essential (primary) hypertension: Secondary | ICD-10-CM | POA: Diagnosis not present

## 2015-03-30 NOTE — Assessment & Plan Note (Signed)
Discussed potential risks and benefits of adding Abilify. Will hold for now. Await referral with Dr. Nicolasa Ducking. Follow up in 3 months or sooner as needed.

## 2015-03-30 NOTE — Progress Notes (Signed)
   Subjective:    Patient ID: Cassidy Ortiz, female    DOB: 1941-11-25, 74 y.o.   MRN: [id number]  HPI  74YO female presents for follow up.  Last seen 4/7 for depression. Started on Abilify. Never tried medication. Concerned about side effects. She reports that she is feeling better. Able to get out and garden some. Her husband reports that this is typically a good time of year for her. Later summer, she typically will be indoors and more depressed  Seen by Urology today. Started on Estrogen cream. Planning to start pelvic physical therapy.  Past medical, surgical, family and social history per today's encounter.  Review of Systems  Constitutional: Negative for fever, chills, appetite change, fatigue and unexpected weight change.  Eyes: Negative for visual disturbance.  Respiratory: Negative for shortness of breath.   Cardiovascular: Negative for chest pain and leg swelling.  Gastrointestinal: Negative for abdominal pain.  Genitourinary: Negative for dysuria, urgency, frequency, hematuria, vaginal bleeding, vaginal discharge, vaginal pain and pelvic pain.  Skin: Negative for color change and rash.  Hematological: Negative for adenopathy. Does not bruise/bleed easily.  Psychiatric/Behavioral: Positive for dysphoric mood. Negative for sleep disturbance. The patient is not nervous/anxious.        Objective:    BP 130/66 mmHg  Pulse 74  Resp 14  Ht 5\' 5"  (1.651 m)  Wt 215 lb 9.6 oz (97.796 kg)  BMI 35.88 kg/m2  SpO2 94% Physical Exam  Constitutional: She is oriented to person, place, and time. She appears well-developed and well-nourished. No distress.  HENT:  Head: Normocephalic and atraumatic.  Right Ear: External ear normal.  Left Ear: External ear normal.  Nose: Nose normal.  Mouth/Throat: Oropharynx is clear and moist. No oropharyngeal exudate.  Eyes: Conjunctivae are normal. Pupils are equal, round, and reactive to light. Right eye exhibits no discharge. Left eye  exhibits no discharge. No scleral icterus.  Neck: Normal range of motion. Neck supple. No tracheal deviation present. No thyromegaly present.  Cardiovascular: Normal rate, regular rhythm, normal heart sounds and intact distal pulses.  Exam reveals no gallop and no friction rub.   No murmur heard. Pulmonary/Chest: Effort normal and breath sounds normal. No respiratory distress. She has no wheezes. She has no rales. She exhibits no tenderness.  Musculoskeletal: Normal range of motion. She exhibits no edema or tenderness.  Lymphadenopathy:    She has no cervical adenopathy.  Neurological: She is alert and oriented to person, place, and time. No cranial nerve deficit. She exhibits normal muscle tone. Coordination normal.  Skin: Skin is warm and dry. No rash noted. She is not diaphoretic. No erythema. No pallor.  Psychiatric: She has a normal mood and affect. Her behavior is normal. Judgment and thought content normal.          Assessment & Plan:   Problem List Items Addressed This Visit      Unprioritized   Absence of bladder continence    Discussed pros and cons of Premarin. Will continue Premarin and follow up with Urology.      Major depressive disorder, recurrent, severe without psychotic features - Primary    Discussed potential risks and benefits of adding Abilify. Will hold for now. Await referral with Dr. Nicolasa Ducking. Follow up in 3 months or sooner as needed.          Return in about 3 months (around 06/29/2015) for Recheck.

## 2015-03-30 NOTE — Assessment & Plan Note (Signed)
Discussed pros and cons of Premarin. Will continue Premarin and follow up with Urology.

## 2015-03-30 NOTE — Progress Notes (Signed)
Pre visit review using our clinic review tool, if applicable. No additional management support is needed unless otherwise documented below in the visit note. 

## 2015-03-30 NOTE — Patient Instructions (Addendum)
We will set up an evaluation with Dr. Nicolasa Ducking.

## 2015-04-03 ENCOUNTER — Emergency Department
Admit: 2015-04-03 | Disposition: A | Discharge status: Home / Self Care | Payer: Self-pay | Admitting: Emergency Medicine

## 2015-04-03 ENCOUNTER — Telehealth: Payer: Self-pay | Admitting: Internal Medicine

## 2015-04-03 DIAGNOSIS — I671 Cerebral aneurysm, nonruptured: Secondary | ICD-10-CM | POA: Diagnosis not present

## 2015-04-03 DIAGNOSIS — F332 Major depressive disorder, recurrent severe without psychotic features: Secondary | ICD-10-CM | POA: Diagnosis not present

## 2015-04-03 DIAGNOSIS — R4182 Altered mental status, unspecified: Secondary | ICD-10-CM | POA: Diagnosis not present

## 2015-04-03 DIAGNOSIS — I10 Essential (primary) hypertension: Secondary | ICD-10-CM | POA: Diagnosis not present

## 2015-04-03 LAB — CBC
HCT: 40.5 % (ref 35.0–47.0)
HGB: 13 g/dL (ref 12.0–16.0)
MCH: 28.7 pg (ref 26.0–34.0)
MCHC: 32 g/dL (ref 32.0–36.0)
MCV: 90 fL (ref 80–100)
Platelet: 172 10*3/uL (ref 150–440)
RBC: 4.52 10*6/uL (ref 3.80–5.20)
RDW: 15 % — ABNORMAL HIGH (ref 11.5–14.5)
WBC: 8.3 10*3/uL (ref 3.6–11.0)

## 2015-04-03 LAB — BASIC METABOLIC PANEL
ANION GAP: 9 (ref 7–16)
BUN: 31 mg/dL — ABNORMAL HIGH
Calcium, Total: 9.4 mg/dL
Chloride: 111 mmol/L
Co2: 24 mmol/L
Creatinine: 1.49 mg/dL — ABNORMAL HIGH
GFR CALC AF AMER: 40 — AB
GFR CALC NON AF AMER: 35 — AB
Glucose: 94 mg/dL
Potassium: 3.9 mmol/L
SODIUM: 144 mmol/L

## 2015-04-03 LAB — TROPONIN I: Troponin-I: 0.03 ng/mL

## 2015-04-03 NOTE — Telephone Encounter (Signed)
Call from Dr. Nicolasa Ducking. Pt at her office to be evaluated, had BP 205/108.  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

## 2015-04-05 ENCOUNTER — Ambulatory Visit (INDEPENDENT_AMBULATORY_CARE_PROVIDER_SITE_OTHER): Payer: Medicare Other | Admitting: Cardiovascular Disease

## 2015-04-05 ENCOUNTER — Encounter: Payer: Self-pay | Admitting: Cardiovascular Disease

## 2015-04-05 VITALS — BP 150/87 | HR 73 | Ht 67.0 in | Wt 211.2 lb

## 2015-04-05 DIAGNOSIS — R011 Cardiac murmur, unspecified: Secondary | ICD-10-CM

## 2015-04-05 DIAGNOSIS — I5031 Acute diastolic (congestive) heart failure: Secondary | ICD-10-CM

## 2015-04-05 DIAGNOSIS — I159 Secondary hypertension, unspecified: Secondary | ICD-10-CM | POA: Diagnosis not present

## 2015-04-05 DIAGNOSIS — I10 Essential (primary) hypertension: Secondary | ICD-10-CM

## 2015-04-05 DIAGNOSIS — R010 Benign and innocent cardiac murmurs: Secondary | ICD-10-CM

## 2015-04-05 DIAGNOSIS — G44229 Chronic tension-type headache, not intractable: Secondary | ICD-10-CM

## 2015-04-05 MED ORDER — DOXAZOSIN MESYLATE 8 MG PO TABS: 8.0000 mg | ORAL_TABLET | Freq: Every day | ORAL | Status: DC

## 2015-04-05 MED ORDER — NITROGLYCERIN 0.4 MG SL SUBL: 0.4000 mg | SUBLINGUAL_TABLET | SUBLINGUAL | Status: DC | PRN

## 2015-04-05 MED ORDER — CARVEDILOL 12.5 MG PO TABS: 12.5000 mg | ORAL_TABLET | Freq: Two times a day (BID) | ORAL | Status: DC

## 2015-04-05 NOTE — Assessment & Plan Note (Signed)
Blood pressure continues to run high History of labile pressures We have recommended she increase the carvedilol up to 12.5 mg twice a day and start Cardura 4 mg in the evening. If blood pressure continues to run high, we will increase the Cardura up to 8 mg in the evening If blood pressure is greater than 99991111 systolic, recommended she take sublingual nitroglycerin and take extra hydralazine or clonidine

## 2015-04-05 NOTE — Progress Notes (Signed)
Patient ID: Cassidy Ortiz, female    DOB: Jan 08, 1941, 74 y.o.   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 She initially presented with lower extremity edema.  Previous  elevated creatinine of 1.5. Edema improved by holding diltiazem She presents today for follow-up of her blood pressure  In follow-up today, she reports that she went to the emergency room 04/03/2015 for severe hypertension Systolic pressures were greater than 200. She reports compliance with her medication She was not anxious, was not in pain, denied any stressors that could have contributed to her high blood pressure  they gave her hydralazine IV, gave her her regular afternoon medications with mild improvement of her numbers and then discharged her home In follow-up, she reports that she feels well with no complaints. She's not been monitoring her blood pressure at home very much  Review of hospital records shows creatinine 1.49, BUN 31, hematocrit 40, EKG with normal sinus rhythm, rate 56 bpm, CT scan of the head with no acute findings  Other past medical history  EGD on December 3 and had severe hypertension prior to the procedure. EGD showed small region of Barrett's esophagus After she got home, blood pressure seem to improve down to her baseline in the 130 range Went in for colonoscopy 11/18/2014 and again had severe hypertension Again after the procedure, went home and blood pressure significantly improved  Blood work showing total  cholesterol 150, LDL 69  Echocardiogram showed severe LVH, mild outflow tract gradient. No mention of elevated right ventricular systolic pressures. Previous  Problems with anemia before requiring IM iron, now resolved  Previous EGD showed  ulcer, H. Pylori negative.  Earlier in 2013, we discontinued the diltiazem and Her edema resolved.  She reports that in followup today, she is depressed. Recently started on Remeron for sleep over the past several months. Uncertain if this is helping but sleep is okay. She's not doing any exercise. Very sedentary. Feels her blood pressure is doing well but is not checking any numbers. Weight continues to trend upwards  previous assessment/evaluation at [facility] for history of aneurysms and shunt (leak?) showed that shunt is not working but it is not a problem. No recent carotid ultrasound   Allergies  Allergen Reactions  . Amlodipine     Leg swelling    Outpatient Encounter Prescriptions as of 04/05/2015  Medication Sig  . ALPRAZolam (XANAX) 0.25 MG tablet Take 1 tablet (0.25 mg total) by mouth 3 (three) times daily as needed for anxiety.  . ARIPiprazole (ABILIFY) 2 MG tablet Take 2 mg by mouth daily.  Marland Kitchen aspirin 81 MG tablet Take 81 mg by mouth daily. Takes 2 tablets daily  . atorvastatin (LIPITOR) 40 MG tablet TAKE 1 TABLET DAILY  . carvedilol (COREG) 12.5 MG tablet Take 1 tablet (12.5 mg total) by mouth 2 (two) times daily with a meal.  . cloNIDine (CATAPRES) 0.1 MG tablet Take 1 tablet (0.1 mg total) by mouth 3 (three) times daily.  . Esomeprazole Magnesium (NEXIUM PO) Take 20 mg by mouth every morning.   Marland Kitchen  hydrALAZINE (APRESOLINE) 100 MG tablet TAKE 1 TABLET 3 TIMES A DAY  . lisinopril (PRINIVIL,ZESTRIL) 10 MG tablet Take 10 mg by mouth daily.  . mirtazapine (REMERON) 7.5 MG tablet TAKE 2 TABLETS (15 MG TOTAL) BY MOUTH AT BEDTIME.  . Multiple Vitamin (MULTIVITAMIN) tablet Take 1 tablet by mouth daily.    Marland Kitchen rOPINIRole (REQUIP) 1 MG tablet Take 1 mg  by mouth daily as needed.   Marland Kitchen SYNTHROID 175 MCG tablet TAKE 1 TABLET BY MOUTH EVERY DAY  . VIIBRYD 40 MG TABS TAKE 1 TABLET DAILY  . [DISCONTINUED] carvedilol (COREG) 6.25 MG tablet Take 1 tablet (6.25 mg total) by mouth 2 (two) times daily with a meal.  . doxazosin (CARDURA) 8 MG tablet Take 1 tablet (8 mg total) by mouth daily.  . nitroGLYCERIN (NITROSTAT) 0.4 MG SL tablet Place 1 tablet (0.4 mg total) under the tongue every 5 (five) minutes as needed for chest pain.  . [DISCONTINUED] doxazosin (CARDURA) 8 MG tablet Take 1 tablet (8 mg total) by mouth daily.    Past Medical History  Diagnosis Date  . Vitamin D deficiency   . Mixed hyperlipidemia   . Anxiety state, unspecified   . Depressive disorder, not elsewhere classified   . Obstructive sleep apnea (adult) (pediatric)   . Hypertensive kidney disease, benign   . Cerebral aneurysm, nonruptured 2008    s/p coil and shunt, performed in [place]  . Duodenal ulcer   . Chronic kidney disease, stage III (moderate)   . Acquired cyst of kidney   . Abnormal weight gain   . Fatty liver   . Diverticulosis of colon (without mention of hemorrhage)   . Personal history of colonic polyps 10/22/2002    hyperplastic   . Hypertension   . Duodenal mass   . Baker's cyst of knee     Left, pt does not remember  . Edema   . SOB (shortness of breath)   . Malignant neoplasm of breast (female), unspecified site 2001    right, s/p lumpectomy and XRT  . 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 Unspecified hypothyroidism   . Grave's disease   . Hypothyroid   . Sleep apnea     off cpap  . Cerebral aneurysm rupture     stent    Past Surgical History  Procedure Laterality Date  . Breast lumpectomy Right     right  . Partial nephrectomy      right  . Shoulder surgery      left  . Cataract extraction      bilateral  . Coronary stent placement      brain  . Femoral artery repair  2011  . Csf shunt   08/17/2008  . Hemiarthroplasty shoulder fracture      left  . Vaginal delivery      3  . Upper gastrointestinal endoscopy  11-10-2014  . Colonoscopy      last 2012.+ TA polyps    Social History  reports that she quit smoking about 7 years ago. Her smoking use included Cigarettes. She started smoking about 51 years ago. She has a 43 pack-year smoking history. She has never used smokeless tobacco. She reports that she drinks about 0.6 oz of alcohol per week. She reports that she does not use illicit drugs.  Family History family history includes Cancer in her sister; Colon cancer in her sister; Heart disease in her father. There is no history  of Esophageal cancer, Rectal cancer, or Stomach cancer.   Review of Systems  Constitutional: Negative.   Respiratory: Negative.   Cardiovascular: Negative.           Gastrointestinal: Negative.   Endocrine: Negative.   Musculoskeletal: Negative.   Skin: Negative.   Neurological: Negative.   Hematological: Negative.   Psychiatric/Behavioral: The patient is nervous/anxious.   All other systems reviewed and are negative.  BP 150/87 mmHg  Pulse 73  Ht 5\' 7"  (1.702 m)  Wt 211 lb 4 oz (95.822 kg)  BMI 33.08 kg/m2  Physical Exam  Constitutional: She is oriented to person, place, and time. She appears well-developed and well-nourished.  HENT:  Head: Normocephalic.  Nose: Nose normal.  Mouth/Throat: Oropharynx is clear and moist.  Eyes: Conjunctivae are normal. Pupils are equal, round, and reactive to light.  Neck: Normal range of motion. Neck supple. No JVD present.  Cardiovascular: Normal rate, regular rhythm, S1 normal, S2 normal and intact distal pulses.  Exam reveals no gallop and no friction rub.   Murmur heard.  Systolic murmur is present with a grade of 2/6  Pulmonary/Chest: Effort normal and breath sounds normal. No respiratory distress. She has no wheezes. She has no rales. She exhibits no tenderness.  Abdominal: Soft. Bowel sounds  are normal. She exhibits no distension. There is no tenderness.  Musculoskeletal: Normal range of motion. She exhibits no edema or tenderness.  Lymphadenopathy:    She has no cervical adenopathy.  Neurological: She is alert and oriented to person, place, and time. Coordination normal.  Skin: Skin is warm and dry. No rash noted. No erythema.  Psychiatric: She has a normal mood and affect. Her behavior is normal. Judgment and thought content normal.    Assessment and Plan  Nursing note and vitals reviewed.

## 2015-04-05 NOTE — Patient Instructions (Signed)
You are doing well.  Please increase the coreg up to 12.5 mg twice a day Please start cardura 1/2 pill in the evening (for blood pressure)  In an emergency, with high blood pressure, (>190) Take a Nitro under the tongue (lay in the bed or chair) Ok to take extra clonidine or hydralazine as needed for high blood rpessure  Please call us if you have new issues that need to be addressed before your next appt.  Your physician wants you to follow-up in: 6 months.  You will receive a reminder letter in the mail two months in advance. If you don't receive a letter, please call our office to schedule the follow-up appointment.

## 2015-04-05 NOTE — Assessment & Plan Note (Signed)
Low-grade aortic valve murmur. No further workup at this time

## 2015-04-05 NOTE — Assessment & Plan Note (Signed)
We have encouraged continued exercise, careful diet management in an effort to lose weight. 

## 2015-04-05 NOTE — Assessment & Plan Note (Signed)
Etiology unclear, likely unrelated to her blood pressure. She has mild headache today in the clinic, blood pressure mildly elevated.

## 2015-04-05 NOTE — Assessment & Plan Note (Signed)
Appears relatively euvolemic. Recent lab work suggesting mild dehydration as both BUN and creatinine were elevated Currently not on a diuretic

## 2015-04-06 ENCOUNTER — Telehealth: Payer: Self-pay | Admitting: *Deleted

## 2015-04-06 ENCOUNTER — Encounter
Admit: 2015-04-06 | Disposition: A | Discharge status: Home / Self Care | Payer: Self-pay | Attending: Urology | Admitting: Urology

## 2015-04-06 DIAGNOSIS — R32 Unspecified urinary incontinence: Secondary | ICD-10-CM | POA: Diagnosis not present

## 2015-04-06 DIAGNOSIS — R278 Other lack of coordination: Secondary | ICD-10-CM | POA: Diagnosis not present

## 2015-04-06 DIAGNOSIS — G4733 Obstructive sleep apnea (adult) (pediatric): Secondary | ICD-10-CM

## 2015-04-06 NOTE — Telephone Encounter (Signed)
Received a message from pt's rehab physician, Dr Jerl Mina, stating that pt told her that she has not used her CPAP machine in the past 5 yrs.  Dr Aurea Graff states that the pt needs a new sleep study completed.  I will call pt and set up an appt with you to have the sleep study order placed.

## 2015-04-06 NOTE — Telephone Encounter (Signed)
OK Order placed

## 2015-04-07 NOTE — Telephone Encounter (Signed)
Notified pt's husband, Jenny Reichmann, that pt needs to call our office and schedule an appt for sleep study to be ordered.

## 2015-04-13 ENCOUNTER — Telehealth: Payer: Self-pay

## 2015-04-13 ENCOUNTER — Encounter: Payer: Self-pay | Admitting: Physical Therapy

## 2015-04-13 ENCOUNTER — Ambulatory Visit: Payer: Medicare Other | Admitting: Physical Therapy

## 2015-04-13 VITALS — BP 130/70

## 2015-04-13 DIAGNOSIS — R293 Abnormal posture: Secondary | ICD-10-CM

## 2015-04-13 DIAGNOSIS — R279 Unspecified lack of coordination: Secondary | ICD-10-CM

## 2015-04-13 DIAGNOSIS — M6281 Muscle weakness (generalized): Secondary | ICD-10-CM

## 2015-04-13 DIAGNOSIS — R32 Unspecified urinary incontinence: Secondary | ICD-10-CM | POA: Insufficient documentation

## 2015-04-13 DIAGNOSIS — R278 Other lack of coordination: Secondary | ICD-10-CM | POA: Diagnosis not present

## 2015-04-13 NOTE — Telephone Encounter (Signed)
pts rehab MD called requesting status of sleep study.  Reviewed chart, saw referral from 4.28.16 for Sleep Study, called left detailed messae on Dr Ellery Plunk VM advising of same.

## 2015-04-13 NOTE — Patient Instructions (Signed)
PELVIC FLOOR / KEGEL EXERCISES   Pelvic floor/ Kegel exercises are used to strengthen the muscles in the base of your pelvis that are responsible for supporting your pelvic organs and preventing urine/feces leakage. Based on your therapist's recommendations, they can be performed while standing, sitting, or lying down.  Make yourself aware of this muscle group by using these cues: . Imagine you are in a crowded room and you feel the need to pass gas. Your response is to pull up and in at the rectum. . Close the vagina and rectum. Pull the muscles up inside your body. Imagine picking up marbles with your vagina. . Place your hand on top of your pubic bone. Tighten and draw in the muscles around the anal and vaginal openings. You will feel the muscles lift towards your pubic bone and squeeze the openings shut.  Common Errors: . Breath holding: If you are holding your breath, you may be bearing down against your bladder instead of pulling it up. If you belly bulges up while you are squeezing, you are holding your breath. Be sure to breathe gently in and out while exercising. Counting out loud may help you avoid holding your breath. . Accessory muscle use: You should not see or feel other muscle movement when performing pelvic floor exercises. When done properly, no one can tell that you are performing the exercises. Keep the buttocks, belly and inner thighs relaxed. . Overdoing it: Your muscles can fatigue and stop working for you if you over-exercise. You may actually leak more or feel soreness at the lower abdomen or rectum.  YOUR HOME EXERCISE PROGRAM  QUICK FLICKS:  Position: laying or sitting  . Inhale and then exhale to squeeze the muscle quick and hold  for one second, then let go for one second.  . Perform 5 repetitions,  3 times/day  LONG HOLDS: Position: laying down . Inhale and then exhale to squeeze the muscle and hold it for 6 seconds. Rest for 6 seconds= 3 breaths.  . Perform  . 5  repetitions, 3 times/day = 15 total this week.  Marland Kitchen Next week, 7 reps x 3/day= 21 total

## 2015-04-13 NOTE — Telephone Encounter (Signed)
Looks like Dr. Thomes Dinning office is already taking care of this.

## 2015-04-13 NOTE — Therapy (Signed)
[place] MAIN [facility] SERVICES [address] [place], [place], [address] Phone: [phone]   Fax:  [phone]  Physical Therapy Treatment  Patient Details  Name: Cassidy Ortiz MRN: [id number] Date of Birth: [birth year] Referring Provider:  Nori Riis, PA-C  Encounter Date: 04/13/2015      PT End of Session - 04/13/15 1153    Visit Number 2   Number of Visits 12   Date for PT Re-Evaluation 06/08/15   Authorization Type Medicare G-code every 10th visit, today 2/10   PT Start Time 1010   PT Stop Time 1115   PT Time Calculation (min) 65 min   Activity Tolerance Patient tolerated treatment well   Behavior During Therapy [facility] for tasks assessed/performed      Past Medical History  Diagnosis Date  . Vitamin D deficiency   . Mixed hyperlipidemia   . Anxiety state, unspecified   . Depressive disorder, not elsewhere classified   . Obstructive sleep apnea (adult) (pediatric)   . Hypertensive kidney disease, benign   . Cerebral aneurysm, nonruptured 2008    s/p coil and shunt, performed in [place]  . Duodenal ulcer   . Chronic kidney disease, stage III (moderate)   . Acquired cyst of kidney   . Abnormal weight gain   . Fatty liver   . Diverticulosis of colon (without mention of hemorrhage)   . Personal history of colonic polyps 10/22/2002    hyperplastic   . Hypertension   . Duodenal mass   . Baker's cyst of knee     Left, pt does not remember  . Edema   . SOB (shortness of breath)   . Malignant neoplasm of breast (female), unspecified site 2001    right, s/p lumpectomy and XRT  . 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 Unspecified hypothyroidism   . Grave's disease   . Hypothyroid   . Sleep apnea     off cpap  . Cerebral aneurysm rupture     stent    Past Surgical History  Procedure Laterality Date  . Breast lumpectomy Right     right  . Partial nephrectomy      right  . Shoulder  surgery      left  . Cataract extraction      bilateral  . Coronary stent placement      brain  . Femoral artery repair  2011  . Csf shunt  08/17/2008  . Hemiarthroplasty shoulder fracture      left  . Vaginal delivery      3  . Upper gastrointestinal endoscopy  11-10-2014  . Colonoscopy      last 2012.+ TA polyps    Filed Vitals:   04/13/15 1030  BP: 130/70    Visit Diagnosis:  Muscle weakness (generalized)  Abnormal posture  Lack of coordination      Subjective Assessment - 04/13/15 1031    Subjective Pt reported today feeling tired due to lack of sleep. Pt has not heard back from her MD's office re: PT's request for f/u on sleep study as PT was concerned for lack of compliance to use of CPAP for the past 5+ years. Since last visit,. pt has purchased squatty pottty and practiced toileting posture. Pt reported she has increased water intake to 4 glasses /day. Pt stopped drinking tea after 6pm. Pt reported taking her BP meds this morning. Pt appeared drowsy.    Patient is  accompained by: Family member  Mr. Whitson       O:   Patient Education/Advocacy:  1.Pt's husband was brought into room to review medications as he was a more reliable historian for pt's meds. PT reviewed meds with pt's list which husband provided.  2. With pt in the room, PT called Pt's PCP a second time. Left msg w/ staff re:  pt's poor compliance w/ CPAP (> 5 yo) and possible need to for updated sleep study to address current c/o poor sleep quality for past months and recent cardiac conditions that lead pt to ER.  PT also called Pt's cardiologist office and left msg w/ staff. Staff stated they will relay msg to MD and wrote down PT and pt's phone #.                   Assessment of Pelvic Floor Muscle and Neuro-Redu: Pt consented for internal exam and denied infections and other contraindications.  1. Noted no abnormal descent of pelvic organs with cue for bearing down pelvic floor muscles.  2. Pt  intitially demo'd excessive abdominal mm activation with cue for isolated pelvic floor contraction. Pt able to demo correctly after verbal and tactile cues.  3. Grade 3-/6/5/5 4. Educated alignment for seated posture and technique for diaphragmatic breathing (explained benefits and physiology) for rest periods between pelvic floor contractions.                       PT Education - 04/13/15 1152    Education provided Yes   Education Details HEP and importance of CPAP compliance   Person(s) Educated Patient;Spouse   Methods Explanation;Demonstration;Tactile cues;Verbal cues;Handout   Comprehension Verbalized understanding;Returned demonstration          PT Short Term Goals - 04/13/15 1412    PT SHORT TERM GOAL #1   Title Pt decrease her tea intake/ day by 50% (from 4 cups to 2 cups/day)  and increase her water intake by 50% ( from 1 cup to 4 cups) in order to participate more community events with decreased frequency to urinate.   Time 4   Period Weeks   Status On-going   PT SHORT TERM GOAL #2   Title Patient will be independent with completion of home exercise program for pelvic floor exercise, progressing to standing for all ADL's/activities.   Time 4   Period Weeks   Status On-going           PT Long Term Goals - 04/13/15 1422    PT LONG TERM GOAL #1   Title Pt will report nocturia decrease from 3-4x/night to < 3x/night in order to improve sleep.    Time 12   Period Weeks   Status On-going   PT LONG TERM GOAL #2   Title Pt will report no longer leaking upon arriving at her home and is able to make it her toilet for 2 occasions in order to demo improved urge and continent Sx.    Time 12   Period Weeks   Status On-going   PT LONG TERM GOAL #3   Title Patient will score decrease in her PDFI UDI score from 58.33% to < 48% in order to demo improved QOL and function.    Time 12   Period Weeks   Status On-going   PT LONG TERM GOAL #4   Title Patient will  not require more than 1 pads/day for protection against leaking.   Time 12  Period Weeks   Status On-going               Plan - 05/11/2015 1157    Clinical Impression Statement She is progressing well towards her goals and was quick to learn proper isolated pelvicfloor contraction technique with Grade 3-, 6 sec holds, 5 reps for endurance training. Pt's prognosis is limited by her poor sleep quality and co-morbidities.Marland KitchenFollowed-up with pt's MDs during appt today  to address her need for updated sleep study and build compliance for CPAP use.  Ancitipate nocturia may improve w. better management of OSA.   Pt will benefit from skilled therapeutic intervention in order to improve on the following deficits Decreased coordination;Decreased endurance;Decreased skin integrity;Decreased mobility;Decreased strength;Pain;Improper body mechanics;Postural dysfunction   Rehab Potential Good   Clinical Impairments Affecting Rehab Potential co-morbidities   PT Frequency 1x / week   PT Duration 2 weeks   PT Treatment/Interventions ADLs/Self Care Home Management;Gait training;Neuromuscular re-education;Aquatic Therapy;Cryotherapy;Biofeedback;Functional mobility training;Therapeutic activities;Patient/family education;Therapeutic exercise;Manual techniques;Moist Heat;Balance training   PT Next Visit Plan hip strengthening, dyn stabilization    PT Home Exercise Plan progress pelvic floor exercise and see above   Consulted and Agree with Plan of Care Patient          G-Codes - 2015/05/11 1154    Functional Assessment Tool Used [id number]   Functional Limitation Self care   Self Care Current Status [id number]) At least 40 percent but less than 60 percent impaired, limited or restricted   Self Care Goal Status [id number]) At least 20 percent but less than 40 percent impaired, limited or restricted      Problem List Patient Active Problem List   Diagnosis Date Noted  . Major depressive disorder, recurrent,  severe without psychotic features 03/16/2015  . Chest pressure 03/16/2015  . Absence of bladder continence 02/16/2015  . Obesity (BMI 30-39.9) 08/18/2014  . Right leg swelling 02/22/2014  . Hyperlipidemia 01/05/2014  . Insomnia 10/15/2013  . Headache 05/26/2013  . Abdominal pain, epigastric 05/26/2013  . Anemia 03/24/2013  . Morbid obesity 02/15/2013  . Medicare annual wellness visit, subsequent 12/21/2012  . Bradycardia 11/11/2012  . Cerebral aneurysm 10/30/2012  . S/P VP shunt 10/30/2012  . Hypothyroidism 10/23/2012  . Thyroid disease 09/23/2012  . Acute diastolic CHF (congestive heart failure) 05/18/2012  . 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 Edema 11/29/2011  . Duodenal ulcer, unspecified as acute or chronic, without hemorrhage, perforation, or obstruction 09/06/2011  . Other specified disorder of stomach and duodenum 09/06/2011  . Esophagitis 09/06/2011  . Duodenal ulcer disease 05/28/2011  . Fatty liver 05/28/2011  . Benign neoplasm of colon 05/13/2011  . Mass of colon 05/13/2011  . Duodenal mass 05/13/2011  . GERD (gastroesophageal reflux disease) 05/13/2011  . Abdominal pain, chronic, epigastric 05/13/2011  . Family history of malignant neoplasm of gastrointestinal tract 05/02/2011  . Esophageal reflux 05/02/2011  . Epigastric pain 05/02/2011  . Hypertension 05/02/2011  . History of kidney cancer 05/02/2011  . Breast cancer 05/02/2011  . MURMUR 09/13/2009  . GRAVES' DISEASE 09/12/2009  . Depression 09/12/2009  . MIGRAINE HEADACHE 09/12/2009  . CEREBRAL ANEURYSM 09/12/2009   Jerl Mina, PT, DPT 11-May-2015, 2:26 PM  [place] MAIN [facility] SERVICES [address] [place], [place], [address] Phone: [phone]   Fax:  [phone]

## 2015-04-13 NOTE — Telephone Encounter (Signed)
Also able to call cell [phone] PT is concerned about pt, states pt has not been using her sleep pap in over 5 years. She has contacted her PCP to let her know of pt not wearing, but has not heard back from them. States she thought maybe Dr. Darnell Level can expedite this. States pt has not slept well in months. Please call. Also would request pt be called at home [phone]

## 2015-04-17 DIAGNOSIS — F332 Major depressive disorder, recurrent severe without psychotic features: Secondary | ICD-10-CM | POA: Diagnosis not present

## 2015-04-18 ENCOUNTER — Ambulatory Visit: Payer: Medicare Other | Admitting: Cardiovascular Disease

## 2015-04-19 ENCOUNTER — Ambulatory Visit: Payer: Medicare Other | Admitting: Physical Therapy

## 2015-04-19 ENCOUNTER — Encounter: Payer: Self-pay | Admitting: Physical Therapy

## 2015-04-19 VITALS — BP 143/64

## 2015-04-19 DIAGNOSIS — R293 Abnormal posture: Secondary | ICD-10-CM

## 2015-04-19 DIAGNOSIS — R278 Other lack of coordination: Secondary | ICD-10-CM | POA: Diagnosis not present

## 2015-04-19 DIAGNOSIS — R279 Unspecified lack of coordination: Secondary | ICD-10-CM

## 2015-04-19 DIAGNOSIS — M6281 Muscle weakness (generalized): Secondary | ICD-10-CM

## 2015-04-19 DIAGNOSIS — R32 Unspecified urinary incontinence: Secondary | ICD-10-CM | POA: Diagnosis not present

## 2015-04-20 ENCOUNTER — Ambulatory Visit (HOSPITAL_BASED_OUTPATIENT_CLINIC_OR_DEPARTMENT_OTHER): Payer: Medicare Other

## 2015-04-20 ENCOUNTER — Other Ambulatory Visit: Payer: Self-pay | Admitting: *Deleted

## 2015-04-20 ENCOUNTER — Ambulatory Visit (HOSPITAL_BASED_OUTPATIENT_CLINIC_OR_DEPARTMENT_OTHER): Payer: Medicare Other | Admitting: Hematology & Oncology

## 2015-04-20 ENCOUNTER — Encounter: Payer: Self-pay | Admitting: Hematology & Oncology

## 2015-04-20 ENCOUNTER — Encounter: Payer: Medicare Other | Admitting: Physical Therapy

## 2015-04-20 ENCOUNTER — Other Ambulatory Visit (HOSPITAL_BASED_OUTPATIENT_CLINIC_OR_DEPARTMENT_OTHER): Payer: Medicare Other

## 2015-04-20 VITALS — BP 158/67 | HR 66 | Temp 98.6°F | Resp 16 | Ht 67.0 in | Wt 216.0 lb

## 2015-04-20 VITALS — BP 159/68 | HR 62 | Resp 20

## 2015-04-20 DIAGNOSIS — F329 Major depressive disorder, single episode, unspecified: Secondary | ICD-10-CM

## 2015-04-20 DIAGNOSIS — C50912 Malignant neoplasm of unspecified site of left female breast: Secondary | ICD-10-CM

## 2015-04-20 DIAGNOSIS — D500 Iron deficiency anemia secondary to blood loss (chronic): Secondary | ICD-10-CM

## 2015-04-20 DIAGNOSIS — C50919 Malignant neoplasm of unspecified site of unspecified female breast: Secondary | ICD-10-CM | POA: Diagnosis not present

## 2015-04-20 DIAGNOSIS — E0590 Thyrotoxicosis, unspecified without thyrotoxic crisis or storm: Secondary | ICD-10-CM

## 2015-04-20 DIAGNOSIS — Z853 Personal history of malignant neoplasm of breast: Secondary | ICD-10-CM

## 2015-04-20 DIAGNOSIS — Z8553 Personal history of malignant neoplasm of renal pelvis: Secondary | ICD-10-CM

## 2015-04-20 DIAGNOSIS — D509 Iron deficiency anemia, unspecified: Secondary | ICD-10-CM

## 2015-04-20 DIAGNOSIS — G4739 Other sleep apnea: Secondary | ICD-10-CM

## 2015-04-20 LAB — COMPREHENSIVE METABOLIC PANEL
ALBUMIN: 3.6 g/dL (ref 3.5–5.2)
ALK PHOS: 70 U/L (ref 39–117)
ALT: 15 U/L (ref 0–35)
AST: 14 U/L (ref 0–37)
BUN: 35 mg/dL — ABNORMAL HIGH (ref 6–23)
CALCIUM: 8.9 mg/dL (ref 8.4–10.5)
CO2: 22 mEq/L (ref 19–32)
Chloride: 111 mEq/L (ref 96–112)
Creatinine, Ser: 1.53 mg/dL — ABNORMAL HIGH (ref 0.50–1.10)
GLUCOSE: 119 mg/dL — AB (ref 70–99)
POTASSIUM: 4 meq/L (ref 3.5–5.3)
SODIUM: 145 meq/L (ref 135–145)
TOTAL PROTEIN: 6.1 g/dL (ref 6.0–8.3)
Total Bilirubin: 0.4 mg/dL (ref 0.2–1.2)

## 2015-04-20 LAB — CBC WITH DIFFERENTIAL (CANCER CENTER ONLY)
BASO#: 0 10*3/uL (ref 0.0–0.2)
BASO%: 0.3 % (ref 0.0–2.0)
EOS%: 2.6 % (ref 0.0–7.0)
Eosinophils Absolute: 0.2 10*3/uL (ref 0.0–0.5)
HCT: 37.5 % (ref 34.8–46.6)
HGB: 12.4 g/dL (ref 11.6–15.9)
LYMPH#: 1.4 10*3/uL (ref 0.9–3.3)
LYMPH%: 20.4 % (ref 14.0–48.0)
MCH: 29.7 pg (ref 26.0–34.0)
MCHC: 33.1 g/dL (ref 32.0–36.0)
MCV: 90 fL (ref 81–101)
MONO#: 0.5 10*3/uL (ref 0.1–0.9)
MONO%: 7.8 % (ref 0.0–13.0)
NEUT#: 4.7 10*3/uL (ref 1.5–6.5)
NEUT%: 68.9 % (ref 39.6–80.0)
Platelets: 157 10*3/uL (ref 145–400)
RBC: 4.18 10*6/uL (ref 3.70–5.32)
RDW: 14.9 % (ref 11.1–15.7)
WBC: 6.8 10*3/uL (ref 3.9–10.0)

## 2015-04-20 MED ORDER — SODIUM CHLORIDE 0.9 % IV SOLN
510.0000 mg | Freq: Once | INTRAVENOUS | Status: AC
  Administered 2015-04-20: 510 mg via INTRAVENOUS
  Filled 2015-04-20: qty 17

## 2015-04-20 MED ORDER — SODIUM CHLORIDE 0.9 % IV SOLN
INTRAVENOUS | Status: DC
  Administered 2015-04-20: 16:00:00 via INTRAVENOUS

## 2015-04-20 NOTE — Patient Instructions (Signed)

## 2015-04-20 NOTE — Progress Notes (Signed)
Hematology and Oncology Follow Up Visit  Betzy Wickers [id number] Jul 21, 1941 74 y.o. 04/20/2015   Principle Diagnosis:  1. 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 2. 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 3. Intermittent iron-deficiency anemia.  Current Therapy:    observation     Interim History:  Ms.  Thompson is back for followup. She seems very depressed today. She is not doing anything. She said that she has no energy area and she really cannot do any exercise. She is gaining weight. She eats but again does not exercise. She says she does not have the energy for this.  She has seen a psychiatrist. He has been adjusting her medications. It looks like depression is an issue.  She is not sleeping well. She has a CPAP machine but doesn't use this. Hopefully this will be done and she will be able to get better sleep which I think will make her feel better.  Only last saw her, her alkaline phosphatase was up quite a bit. I am rechecking that today. I think this probably is from medications. It also could represent a fatty liver.  She's had no rashes. There's been no bleeding. She's had no leg swelling.  Her cat has hyperthyroidism. She is him medication for . h.   I think her family doctor is monitoring her thyroid function.    Medications:  Current outpatient prescriptions:  .  ALPRAZolam (XANAX) 0.25 MG tablet, Take 1 tablet (0.25 mg total) by mouth 3 (three) times daily as needed for anxiety., Disp: 60 tablet, Rfl: 4 .  ARIPiprazole (ABILIFY) 2 MG tablet, Take 2 mg by mouth daily., Disp: , Rfl:  .  aspirin 81 MG tablet, Take 81 mg by mouth daily. Takes 2 tablets daily, Disp: , Rfl:  .  atorvastatin (LIPITOR) 40 MG tablet, TAKE 1 TABLET DAILY, Disp: 90 tablet, Rfl: 3 .  carvedilol (COREG) 12.5 MG tablet, Take 1 tablet (12.5 mg total) by mouth 2 (two) times daily with a meal., Disp: 180 tablet, Rfl: 3 .  cloNIDine (CATAPRES) 0.1 MG tablet, Take  1 tablet (0.1 mg total) by mouth 3 (three) times daily., Disp: 270 tablet, Rfl: 3 .  doxazosin (CARDURA) 8 MG tablet, Take 1 tablet (8 mg total) by mouth daily. (Patient taking differently: Take 8 mg by mouth daily. 04-20-15  Taking 1/2 tab daily), Disp: 90 tablet, Rfl: 3 .  Esomeprazole Magnesium (NEXIUM PO), Take 20 mg by mouth every morning. , Disp: , Rfl:  .  hydrALAZINE (APRESOLINE) 100 MG tablet, TAKE 1 TABLET 3 TIMES A DAY, Disp: 270 tablet, Rfl: 3 .  lisinopril (PRINIVIL,ZESTRIL) 10 MG tablet, Take 10 mg by mouth daily., Disp: , Rfl:  .  mirtazapine (REMERON) 30 MG tablet, Take 30 mg by mouth at bedtime., Disp: , Rfl:  .  Multiple Vitamin (MULTIVITAMIN) tablet, Take 1 tablet by mouth daily.  , Disp: , Rfl:  .  rOPINIRole (REQUIP) 1 MG tablet, Take 1 mg by mouth daily as needed. , Disp: , Rfl:  .  SYNTHROID 175 MCG tablet, TAKE 1 TABLET BY MOUTH EVERY DAY, Disp: 90 tablet, Rfl: 3 .  VIIBRYD 40 MG TABS, TAKE 1 TABLET DAILY, Disp: 90 tablet, Rfl: 0 .  zolpidem (AMBIEN CR) 12.5 MG CR tablet, Take 12.5 mg by mouth at bedtime., Disp: , Rfl: 1 .  nitroGLYCERIN (NITROSTAT) 0.4 MG SL tablet, Place 1 tablet (0.4 mg total) under the tongue every 5 (  five) minutes as needed for chest pain. (Patient not taking: Reported on 04/20/2015), Disp: 25 tablet, Rfl: 3 No current facility-administered medications for this visit.  Facility-Administered Medications Ordered in Other Visits:  .  0.9 %  sodium chloride infusion, , Intravenous, Continuous, Volanda Napoleon, MD, Stopped at 04/20/15 1651  Allergies:  Allergies  Allergen Reactions  . Amlodipine     Leg swelling    Past Medical History, Surgical history, Social history, and Family History were reviewed and updated.  Review of Systems: As above  Physical Exam:  height is 5\' 7"  (1.702 m) and weight is 216 lb (97.977 kg). Her oral temperature is 98.6 F (37 C). Her blood pressure is 158/67 and her pulse is 66. Her respiration is 16.    Well-developed and well-nourished white female. Head and neck exam shows no ocular or oral lesions. She has no palpable cervical or supraclavicular lymph nodes. Lungs are clear. Cardiac exam regular in rhythm with no murmurs rubs or bruits. Abdomen is soft. She is good bowel sounds. There is no fluid wave. There is no guarding or rebound tenderness. There is no palpable liver or spleen tip.extremities shows the compression stockings bilaterally. She has minimal edema in her legs. No erythema is noted. She good range of motion and strength. Skin exam no rashes. Neurological exam is non-focal.breast exam shows left breast no masses edema or erythema. There is no left axillary adenopathy. Right breast shows contraction of the tissue. She has the nipple inversion which is chronic. She has some radiation changes. She has no obvious mass in the right breast. There is no right axillary adenopathy.   Lab Results  Component Value Date   WBC 6.8 04/20/2015   HGB 12.4 04/20/2015   HCT 37.5 04/20/2015   MCV 90 04/20/2015   PLT 157 04/20/2015     Chemistry      Component Value Date/Time   NA 144 04/03/2015 1427   NA 142 01/19/2015 1320   NA 140 11/17/2014 0954   NA 142 07/06/2012 1153   K 3.9 04/03/2015 1427   K 3.7 01/19/2015 1320   K 3.9 11/17/2014 0954   CL 111 04/03/2015 1427   CL 107 01/19/2015 1320   CL 108 11/17/2014 0954   CO2 24 04/03/2015 1427   CO2 25 01/19/2015 1320   CO2 25 11/17/2014 0954   BUN 31* 04/03/2015 1427   BUN 27* 01/19/2015 1320   BUN 28* 11/17/2014 0954   BUN 24 07/06/2012 1153   CREATININE 1.49* 04/03/2015 1427   CREATININE 1.3* 01/19/2015 1320   CREATININE 1.5* 11/17/2014 0954      Component Value Date/Time   CALCIUM 9.4 04/03/2015 1427   CALCIUM 9.3 01/19/2015 1320   CALCIUM 9.3 11/17/2014 0954   ALKPHOS 171* 01/19/2015 1320   ALKPHOS 83 11/17/2014 0954   AST 34 01/19/2015 1320   AST 22 11/17/2014 0954   ALT 38 01/19/2015 1320   ALT 19 11/17/2014 0954    BILITOT 0.40 01/19/2015 1320   BILITOT 0.4 11/17/2014 0954         Impression and Plan: Ms. Jensen is 74 year old female. She has remote history of stage I ductal carcinoma of the right breast.she has a past history of right renal cell carcinoma. Again I don't think these are a problem.   *Seems like depression is a big issue. She does see a psychiatrist. They are trying to adjust her medications.  I am going to give her some iron today. When she  was here last time, her iron levels were borderline. I don't think it would be about idea to give [place] mind to see this helps with her fatigue.  Her affect is just so flat. She is seems as if depression is a real problem.  I also think that sleep apnea is an issue. She has a CPAP machine but does not use it because she does not like the appliance that attaches to her face. I think she gets a new one sitting.  I will like to see her back in another couple months or so just to follow-up with her.Volanda Napoleon, MD 5/12/20166:18 PM

## 2015-04-20 NOTE — Therapy (Signed)
[place] MAIN [facility] SERVICES [address] [place], [place], [address] Phone: [phone]   Fax:  [phone]  Physical Therapy Treatment  Patient Details  Name: Cassidy Ortiz MRN: [id number] Date of Birth: 19-Jan-1941 Referring Provider:  Nori Riis, PA-C  Encounter Date: 04/19/2015      PT End of Session - 04/20/15 1009    Visit Number 3   Number of Visits 12   Date for PT Re-Evaluation 06/08/15   Authorization Type Medicare G-code every 10th visit, today 2/10   PT Start Time 1444   PT Stop Time 1545   PT Time Calculation (min) 61 min   Activity Tolerance Patient tolerated treatment well   Behavior During Therapy [facility] for tasks assessed/performed      Past Medical History  Diagnosis Date  . Vitamin D deficiency   . Mixed hyperlipidemia   . Anxiety state, unspecified   . Depressive disorder, not elsewhere classified   . Obstructive sleep apnea (adult) (pediatric)   . Hypertensive kidney disease, benign   . Cerebral aneurysm, nonruptured 2008    s/p coil and shunt, performed in [place]  . Duodenal ulcer   . Chronic kidney disease, stage III (moderate)   . Acquired cyst of kidney   . Abnormal weight gain   . Fatty liver   . Diverticulosis of colon (without mention of hemorrhage)   . Personal history of colonic polyps 10/22/2002    hyperplastic   . Hypertension   . Duodenal mass   . Baker's cyst of knee     Left, pt does not remember  . Edema   . SOB (shortness of breath)   . Malignant neoplasm of breast (female), unspecified site 2001    right, s/p lumpectomy and XRT  . 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 Unspecified hypothyroidism   . Grave's disease   . Hypothyroid   . Sleep apnea     off cpap  . Cerebral aneurysm rupture     stent    Past Surgical History  Procedure Laterality Date  . Breast lumpectomy Right     right  . Partial nephrectomy      right  . Shoulder  surgery      left  . Cataract extraction      bilateral  . Coronary stent placement      brain  . Femoral artery repair  2011  . Csf shunt  08/17/2008  . Hemiarthroplasty shoulder fracture      left  . Vaginal delivery      3  . Upper gastrointestinal endoscopy  11-10-2014  . Colonoscopy      last 2012.+ TA polyps    Filed Vitals:   04/19/15 1448  BP: 143/64    Visit Diagnosis:  Muscle weakness (generalized)  Abnormal posture  Lack of coordination      Subjective Assessment - 04/19/15 1449    Subjective Pt reported having diarrhea for the past 3-4 weeks after each meal. Pt reported she had f/u with [facility] clinic to schedule an appt but has not heard back from them. Pt reported she was able to sleep 8 hrs for the past two nights with 1x nocturia each night. However she still feels tired. Pt is interested in working with a dietitian to help her lose weight.  Pelvic Floor Special Questions - 04/20/15 0947    External Palpation es donned. PFM: Noted proper lift, cue for count aloud           [facility] Adult PT Treatment/Exercise - 04/20/15 0937    Lumbar Exercises: Seated   Sit to Stand 5 reps  with pelvic floor activation   Other Seated Lumbar Exercises yellow band resistance-hip abd 10reps x 5, without band: hip abd w/ shoulder overhead 10 reps   Knee/Hip Exercises: Sidelying   Clams 10 reps x 2 w/ cues for pelvic alignment   Manual Therapy   Manual Therapy Massage;Other (comment)  colon massage                PT Education - 04/20/15 1007    Education provided Yes   Education Details HEP   Person(s) Educated Patient   Methods Explanation;Demonstration;Tactile cues;Verbal cues;Handout   Comprehension Verbalized understanding;Returned demonstration;Verbal cues required;Tactile cues required          PT Short Term Goals - 04/20/15 1031    PT SHORT TERM GOAL #1   Title Pt decrease her tea intake/ day by 50% (from  4 cups to 2 cups/day)  and increase her water intake by 50% ( from 1 cup to 4 cups) in order to participate more community events with decreased frequency to urinate.   Baseline 5/11: pt reported she has decreased her to 1 cup/day and increased her water intake.   Time 4   Period Weeks   Status Achieved   PT SHORT TERM GOAL #2   Title Patient will be independent with completion of home exercise program for pelvic floor exercise, progressing to standing for all ADL's/activities.   Time 4   Period Weeks   Status Partially Met           PT Long Term Goals - 04/20/15 1033    PT LONG TERM GOAL #1   Title Pt will report nocturia decrease from 3-4x/night to < 3x/night in order to improve sleep.    Baseline 5/12 Pt reported she stopped drinking liquids prior to bed and was able to sleep 8hr straight for 2 nights with 1x/ nocturia.    Time 12   Period Weeks   Status Achieved   PT LONG TERM GOAL #2   Title Pt will report no longer leaking upon arriving at her home and is able to make it her toilet for 2 occasions in order to demo improved urge and continent Sx.    Time 12   Period Weeks   Status On-going   PT LONG TERM GOAL #3   Title Patient will score decrease in her PDFI UDI score from 58.33% to < 48% in order to demo improved QOL and function.    Time 12   Period Weeks   Status On-going   PT LONG TERM GOAL #4   Title Patient will not require more than 1 pads/day for protection against leaking.   Time 12   Period Weeks   Status On-going               Plan - 04/20/15 1016    Clinical Impression Statement Pt demo proper breathing and pelvic floor technique and is progressing to longer endurance strengthening along with quick activation during functional positions. Initiated abdominal massage today and hip strengthening which pt tolerated. Pt is progressing towards her goals but she continues to be limited by low energy. PT had called MD after last session and was informed  an  order was sent to sleep clinic. However, PT called sleep clinic during session and was informed they did not receive an order from them. PT f/u w/ MD's office and another order was sent. PT verified with sleep clinic and an order was received thus pt will be notified for scheduling.  Pt's BP pre/post session: 143/63 - > 136/68. Pt reported better compliance with BP meds and was provided dietitian contact at [place] per pt's interest in losing weight.    Pt will benefit from skilled therapeutic intervention in order to improve on the following deficits Decreased coordination;Decreased endurance;Decreased skin integrity;Decreased mobility;Decreased strength;Pain;Improper body mechanics;Postural dysfunction   Rehab Potential Good   Clinical Impairments Affecting Rehab Potential co-morbidities   PT Frequency 1x / week   PT Duration 2 weeks   PT Treatment/Interventions ADLs/Self Care Home Management;Gait training;Neuromuscular re-education;Aquatic Therapy;Cryotherapy;Biofeedback;Functional mobility training;Therapeutic activities;Patient/family education;Therapeutic exercise;Manual techniques;Moist Heat;Balance training   PT Next Visit Plan hip strengthening, dyn stabilization    Consulted and Agree with Plan of Care Patient        Problem List Patient Active Problem List   Diagnosis Date Noted  . Major depressive disorder, recurrent, severe without psychotic features 03/16/2015  . Chest pressure 03/16/2015  . Absence of bladder continence 02/16/2015  . Obesity (BMI 30-39.9) 08/18/2014  . Right leg swelling 02/22/2014  . Hyperlipidemia 01/05/2014  . Insomnia 10/15/2013  . Headache 05/26/2013  . Abdominal pain, epigastric 05/26/2013  . Anemia 03/24/2013  . Morbid obesity 02/15/2013  . Medicare annual wellness visit, subsequent 12/21/2012  . Bradycardia 11/11/2012  . Cerebral aneurysm 10/30/2012  . S/P VP shunt 10/30/2012  . Hypothyroidism 10/23/2012  . Thyroid disease 09/23/2012   . Acute diastolic CHF (congestive heart failure) 05/18/2012  . 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 Edema 11/29/2011  . Duodenal ulcer, unspecified as acute or chronic, without hemorrhage, perforation, or obstruction 09/06/2011  . Other specified disorder of stomach and duodenum 09/06/2011  . Esophagitis 09/06/2011  . Duodenal ulcer disease 05/28/2011  . Fatty liver 05/28/2011  . Benign neoplasm of colon 05/13/2011  . Mass of colon 05/13/2011  . Duodenal mass 05/13/2011  . GERD (gastroesophageal reflux disease) 05/13/2011  . Abdominal pain, chronic, epigastric 05/13/2011  . Family history of malignant neoplasm of gastrointestinal tract 05/02/2011  . Esophageal reflux 05/02/2011  . Epigastric pain 05/02/2011  . Hypertension 05/02/2011  . History of kidney cancer 05/02/2011  . Breast cancer 05/02/2011  . MURMUR 09/13/2009  . GRAVES' DISEASE 09/12/2009  . Depression 09/12/2009  . MIGRAINE HEADACHE 09/12/2009  . CEREBRAL ANEURYSM 09/12/2009    Cassidy Ortiz  ,PT, DPT, E-RYT  04/20/2015, 10:36 AM  [place] MAIN [facility] SERVICES [address] [place], [place], [address] Phone: [phone]   Fax:  [phone]

## 2015-04-20 NOTE — Patient Instructions (Signed)
__Handout for pelvic floor progression to 6 secx 10 reps (3xday) with counting aloud (avoid breathholding), sidelying clams and squats by a counter with pelvic floor contractions 10reps x 2 , 2x/day , colon massage.  __Provided dietitian number via [place] and informed pt PT will help f/u with MD on order for sleep study.

## 2015-04-21 DIAGNOSIS — F332 Major depressive disorder, recurrent severe without psychotic features: Secondary | ICD-10-CM | POA: Diagnosis not present

## 2015-04-21 DIAGNOSIS — Z79899 Other long term (current) drug therapy: Secondary | ICD-10-CM | POA: Diagnosis not present

## 2015-04-25 ENCOUNTER — Encounter: Payer: Medicare Other | Admitting: Physical Therapy

## 2015-04-25 ENCOUNTER — Ambulatory Visit: Payer: Medicare Other | Attending: Urology | Admitting: Physical Therapy

## 2015-04-25 VITALS — BP 148/75

## 2015-04-25 DIAGNOSIS — R279 Unspecified lack of coordination: Secondary | ICD-10-CM

## 2015-04-25 DIAGNOSIS — M6281 Muscle weakness (generalized): Secondary | ICD-10-CM

## 2015-04-25 DIAGNOSIS — R293 Abnormal posture: Secondary | ICD-10-CM

## 2015-04-25 NOTE — Therapy (Signed)
[place] MAIN [facility] SERVICES [address] [place], [place], [address] Phone: [phone]   Fax:  [phone]  Physical Therapy Treatment  Patient Details  Name: Cassidy Ortiz MRN: [id number] Date of Birth: Jun 19, 1941 Referring Provider:  Nori Riis, PA-C  Encounter Date: 04/25/2015    Past Medical History  Diagnosis Date  . Vitamin D deficiency   . Mixed hyperlipidemia   . Anxiety state, unspecified   . Depressive disorder, not elsewhere classified   . Obstructive sleep apnea (adult) (pediatric)   . Hypertensive kidney disease, benign   . Cerebral aneurysm, nonruptured 2008    s/p coil and shunt, performed in [place]  . Duodenal ulcer   . Chronic kidney disease, stage III (moderate)   . Acquired cyst of kidney   . Abnormal weight gain   . Fatty liver   . Diverticulosis of colon (without mention of hemorrhage)   . Personal history of colonic polyps 10/22/2002    hyperplastic   . Hypertension   . Duodenal mass   . Baker's cyst of knee     Left, pt does not remember  . Edema   . SOB (shortness of breath)   . Malignant neoplasm of breast (female), unspecified site 2001    right, s/p lumpectomy and XRT  . 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 Unspecified hypothyroidism   . Grave's disease   . Hypothyroid   . Sleep apnea     off cpap  . Cerebral aneurysm rupture     stent    Past Surgical History  Procedure Laterality Date  . Breast lumpectomy Right     right  . Partial nephrectomy      right  . Shoulder surgery      left  . Cataract extraction      bilateral  . Coronary stent placement      brain  . Femoral artery repair  2011  . Csf shunt  08/17/2008  . Hemiarthroplasty shoulder fracture      left  . Vaginal delivery      3  . Upper gastrointestinal endoscopy  11-10-2014  . Colonoscopy      last 2012.+ TA polyps    Filed Vitals:   04/25/15 1027  BP: 148/75     Visit Diagnosis:  Muscle weakness (generalized)  Abnormal posture  Lack of coordination      Subjective Assessment - 04/25/15 1027    Subjective Pt reported she feels extremiely tired and did not sleep well alst night. Pt stated she has been scheduled for a sleep study this week and her MD's office f/u to ensure she has been scheduled.  Pt continues to practice her HEP but  has not seen any improvement w/ urinary Sx. Pt has contaced the dietitian to seek help with dieting for weightloss.                                    PT Short Term Goals - 04/20/15 1031    PT SHORT TERM GOAL #1   Title Pt decrease her tea intake/ day by 50% (from 4 cups to 2 cups/day)  and increase her water intake by 50% ( from 1 cup to 4 cups) in order to participate more community events with decreased frequency to urinate.   Baseline 5/11: pt reported she has decreased her to  1 cup/day and increased her water intake.   Time 4   Period Weeks   Status Achieved   PT SHORT TERM GOAL #2   Title Patient will be independent with completion of home exercise program for pelvic floor exercise, progressing to standing for all ADL's/activities.   Time 4   Period Weeks   Status Partially Met           PT Long Term Goals - 04/20/15 1033    PT LONG TERM GOAL #1   Title Pt will report nocturia decrease from 3-4x/night to < 3x/night in order to improve sleep.    Baseline 5/12 Pt reported she stopped drinking liquids prior to bed and was able to sleep 8hr straight for 2 nights with 1x/ nocturia.    Time 12   Period Weeks   Status Achieved   PT LONG TERM GOAL #2   Title Pt will report no longer leaking upon arriving at her home and is able to make it her toilet for 2 occasions in order to demo improved urge and continent Sx.    Time 12   Period Weeks   Status On-going   PT LONG TERM GOAL #3   Title Patient will score decrease in her PDFI UDI score from 58.33% to < 48% in order to demo  improved QOL and function.    Time 12   Period Weeks   Status On-going   PT LONG TERM GOAL #4   Title Patient will not require more than 1 pads/day for protection against leaking.   Time 12   Period Weeks   Status On-going               Plan - 04/25/15 1033    Clinical Impression Statement Pt arrived to appt fatigued w/ slowed gait speed, stating she did not sleep well again last night. Pt originally had wanted to stay home to sleep instead of coming to PT . Pt reported she is scheduled for a sleep study this week.  Pt expressed she f/u with a dietitian appt and is interested in signing up for water aerobics for exercise and weightloss. PT recommended pt to withhold from water aerobics due to no changes in urinary Sx and informed her of POC to progress her with deep core strengthening at next visit to help adbvance her towards her goals. Advised pt to return home to rest. When asked if her husband needed to be contacted to pick her up,  pt stated she was alert enough to drive home. Anticipate pt will show increased tolerance to PT and better progress after she undergoes proper treatment for OSA with increased compliant use of CPAP.  Today's  BP 148/75.     Pt will benefit from skilled therapeutic intervention in order to improve on the following deficits Decreased coordination;Decreased endurance;Decreased skin integrity;Decreased mobility;Decreased strength;Pain;Improper body mechanics;Postural dysfunction   Clinical Impairments Affecting Rehab Potential co-morbidities   PT Frequency 1x / week   PT Duration 2 weeks   PT Treatment/Interventions ADLs/Self Care Home Management;Gait training;Neuromuscular re-education;Aquatic Therapy;Cryotherapy;Biofeedback;Functional mobility training;Therapeutic activities;Patient/family education;Therapeutic exercise;Manual techniques;Moist Heat;Balance training   PT Next Visit Plan hip strengthening, dyn stabilization    PT Home Exercise Plan progress  pelvic floor exercise and see above        Problem List Patient Active Problem List   Diagnosis Date Noted  . Major depressive disorder, recurrent, severe without psychotic features 03/16/2015  . Chest pressure 03/16/2015  . Absence of bladder continence 02/16/2015  .  Obesity (BMI 30-39.9) 08/18/2014  . Right leg swelling 02/22/2014  . Hyperlipidemia 01/05/2014  . Insomnia 10/15/2013  . Headache 05/26/2013  . Abdominal pain, epigastric 05/26/2013  . Anemia 03/24/2013  . Morbid obesity 02/15/2013  . Medicare annual wellness visit, subsequent 12/21/2012  . Bradycardia 11/11/2012  . Cerebral aneurysm 10/30/2012  . S/P VP shunt 10/30/2012  . Hypothyroidism 10/23/2012  . Thyroid disease 09/23/2012  . Acute diastolic CHF (congestive heart failure) 05/18/2012  . 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 Edema 11/29/2011  . Duodenal ulcer, unspecified as acute or chronic, without hemorrhage, perforation, or obstruction 09/06/2011  . Other specified disorder of stomach and duodenum 09/06/2011  . Esophagitis 09/06/2011  . Duodenal ulcer disease 05/28/2011  . Fatty liver 05/28/2011  . Benign neoplasm of colon 05/13/2011  . Mass of colon 05/13/2011  . Duodenal mass 05/13/2011  . GERD (gastroesophageal reflux disease) 05/13/2011  . Abdominal pain, chronic, epigastric 05/13/2011  . Family history of malignant neoplasm of gastrointestinal tract 05/02/2011  . Esophageal reflux 05/02/2011  . Epigastric pain 05/02/2011  . Hypertension 05/02/2011  . History of kidney cancer 05/02/2011  . Breast cancer 05/02/2011  . MURMUR 09/13/2009  . GRAVES' DISEASE 09/12/2009  . Depression 09/12/2009  . MIGRAINE HEADACHE 09/12/2009  . CEREBRAL ANEURYSM 09/12/2009    Jerl Mina ,PT, DPT, E-RYT 04/25/2015, 10:45 AM  [place] MAIN [facility] SERVICES [address] [place], [place], [address] Phone: [phone]   Fax:   [phone]

## 2015-04-27 ENCOUNTER — Ambulatory Visit: Payer: Medicare Other | Attending: Specialist

## 2015-04-27 DIAGNOSIS — R0902 Hypoxemia: Secondary | ICD-10-CM | POA: Diagnosis not present

## 2015-04-27 DIAGNOSIS — G4733 Obstructive sleep apnea (adult) (pediatric): Secondary | ICD-10-CM | POA: Insufficient documentation

## 2015-04-28 ENCOUNTER — Encounter: Payer: Medicare Other | Admitting: Physical Therapy

## 2015-05-02 ENCOUNTER — Ambulatory Visit: Payer: Medicare Other | Admitting: Physical Therapy

## 2015-05-02 VITALS — BP 122/78

## 2015-05-02 DIAGNOSIS — R32 Unspecified urinary incontinence: Secondary | ICD-10-CM | POA: Diagnosis not present

## 2015-05-02 DIAGNOSIS — R279 Unspecified lack of coordination: Secondary | ICD-10-CM

## 2015-05-02 DIAGNOSIS — M6281 Muscle weakness (generalized): Secondary | ICD-10-CM

## 2015-05-02 DIAGNOSIS — R293 Abnormal posture: Secondary | ICD-10-CM

## 2015-05-02 DIAGNOSIS — R278 Other lack of coordination: Secondary | ICD-10-CM | POA: Diagnosis not present

## 2015-05-02 NOTE — Patient Instructions (Signed)
  Clam Shell 45 Degrees   Lying with hips and knees bent 45, one pillow between knees and ankles. Lift knee with exhale. Be sure pelvis does not roll backward. Do not arch back. Do 10 times, each leg, 2 times per day.  http://ss.exer.us/75   Copyright  VHI. All rights reserved.     [employer] [facility] [email or web address]    Medical Nutrition Therapy offered at the [facility] ( [place], [place] [address]) on the [place]   Making sense of food labels, grocery choices and restaurant menus is a challenge, especially if you are trying to control a medical condition, lose weight or avoid food allergens.  Our medical nutrition therapy program offers eating, cooking and food shopping guidelines tailored to each patient's individual needs. We can help you find healthy recipes, understand whether specific dietary approaches, such as the Mediterranean diet, might be helpful, and make sense of conflicting nutritional advice you may hear in the media.  Patients are referred by a physician and receive one-on-one nutrition counseling from a registered dietitian. This program can assist with a range of medical goals and conditions, including:      Weight loss     High blood pressure     Diabetes     Food allergies     Renal failure

## 2015-05-03 ENCOUNTER — Encounter: Payer: Self-pay | Admitting: Physical Therapy

## 2015-05-03 NOTE — Therapy (Signed)
[place] MAIN [facility] SERVICES [address] [place], [place], [address] Phone: [phone]   Fax:  [phone]  Physical Therapy Treatment  Patient Details  Name: Cassidy Ortiz MRN: [id number] Date of Birth: [birth year] Referring Provider:  Nori Riis, PA-C  Encounter Date: 05/02/2015      PT End of Session - 05/03/15 1301    Visit Number 4   Number of Visits 12   Date for PT Re-Evaluation 06/08/15   Authorization Type Medicare G-code every 10th visit, today 4/10   PT Start Time 3151   PT Stop Time 1100   PT Time Calculation (min) 45 min   Activity Tolerance Patient tolerated treatment well   Behavior During Therapy [facility] for tasks assessed/performed      Past Medical History  Diagnosis Date  . Vitamin D deficiency   . Mixed hyperlipidemia   . Anxiety state, unspecified   . Depressive disorder, not elsewhere classified   . Obstructive sleep apnea (adult) (pediatric)   . Hypertensive kidney disease, benign   . Cerebral aneurysm, nonruptured 2008    s/p coil and shunt, performed in [place]  . Duodenal ulcer   . Chronic kidney disease, stage III (moderate)   . Acquired cyst of kidney   . Abnormal weight gain   . Fatty liver   . Diverticulosis of colon (without mention of hemorrhage)   . Personal history of colonic polyps 10/22/2002    hyperplastic   . Hypertension   . Duodenal mass   . Baker's cyst of knee     Left, pt does not remember  . Edema   . SOB (shortness of breath)   . Malignant neoplasm of breast (female), unspecified site 2001    right, s/p lumpectomy and XRT  . 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 Unspecified hypothyroidism   . Grave's disease   . Hypothyroid   . Sleep apnea     off cpap  . Cerebral aneurysm rupture     stent    Past Surgical History  Procedure Laterality Date  . Breast lumpectomy Right     right  . Partial nephrectomy      right  . Shoulder  surgery      left  . Cataract extraction      bilateral  . Coronary stent placement      brain  . Femoral artery repair  2011  . Csf shunt  08/17/2008  . Hemiarthroplasty shoulder fracture      left  . Vaginal delivery      3  . Upper gastrointestinal endoscopy  11-10-2014  . Colonoscopy      last 2012.+ TA polyps    Filed Vitals:   05/02/15 1052  BP: 122/78    Visit Diagnosis:  Muscle weakness (generalized)  Abnormal posture  Lack of coordination      Subjective Assessment - 05/02/15 1015    Subjective Pt reported she continues to feel tired. Pt has completed her sleep study and is waiting for the results. Pt has been performing her HEP but is not sure if she is doing them correctly.                          [place] Adult PT Treatment/Exercise - 05/03/15 0001    Bed Mobility   Bed Mobility Supine to Sit   Exercises   Other Exercises  Supine Pelvic  floor exercises reviewed 6 sec holdsx 10 reps. Palpation assessment w/ clothing donned showed proper coordination.   required cuing to count aloud, no breathholding.                 PT Education - 05/03/15 1257    Education provided Yes   Education Details HEP review, Nutrition references, Educated pt on holding PT for 2 weeks until pt's medical conditions are stabilized (f/u with sleep study, begin use of CPAP) because her low energy is currently  limiting her ability to participate in strengthening exercises and retention of learning.   Person(s) Educated Patient   Methods Explanation;Demonstration;Tactile cues;Verbal cues;Handout   Comprehension Verbalized understanding;Returned demonstration          PT Short Term Goals - 04/20/15 1031    PT SHORT TERM GOAL #1   Title Pt decrease her tea intake/ day by 50% (from 4 cups to 2 cups/day)  and increase her water intake by 50% ( from 1 cup to 4 cups) in order to participate more community events with decreased frequency to urinate.   Baseline  5/11: pt reported she has decreased her to 1 cup/day and increased her water intake.   Time 4   Period Weeks   Status Achieved   PT SHORT TERM GOAL #2   Title Patient will be independent with completion of home exercise program for pelvic floor exercise, progressing to standing for all ADL's/activities.   Time 4   Period Weeks   Status Partially Met           PT Long Term Goals - 04/20/15 1033    PT LONG TERM GOAL #1   Title Pt will report nocturia decrease from 3-4x/night to < 3x/night in order to improve sleep.    Baseline 5/12 Pt reported she stopped drinking liquids prior to bed and was able to sleep 8hr straight for 2 nights with 1x/ nocturia.    Time 12   Period Weeks   Status Achieved   PT LONG TERM GOAL #2   Title Pt will report no longer leaking upon arriving at her home and is able to make it her toilet for 2 occasions in order to demo improved urge and continent Sx.    Time 12   Period Weeks   Status On-going   PT LONG TERM GOAL #3   Title Patient will score decrease in her PDFI UDI score from 58.33% to < 48% in order to demo improved QOL and function.    Time 12   Period Weeks   Status On-going   PT LONG TERM GOAL #4   Title Patient will not require more than 1 pads/day for protection against leaking.   Time 12   Period Weeks   Status On-going               Plan - 05/02/15 1200    Clinical Impression Statement Due to pt's c/o fatigue again today, PT witheld progression of exercises. Withholding PT for 2 weeks until pt's medical conditions are stabilized (f/u with sleep study, begin use of CPAP) because her low energy is currently limiting her participation in her POC. Reviewed HEP with pt and pt demo'd corrections IND.    Provided pt nutrition contacts per pt's request.    Pt will benefit from skilled therapeutic intervention in order to improve on the following deficits Decreased coordination;Decreased endurance;Decreased skin integrity;Decreased  mobility;Decreased strength;Pain;Improper body mechanics;Postural dysfunction   Clinical Impairments Affecting Rehab Potential co-morbidities  PT Frequency 1x / week   PT Duration 2 weeks   PT Treatment/Interventions ADLs/Self Care Home Management;Gait training;Neuromuscular re-education;Aquatic Therapy;Cryotherapy;Biofeedback;Functional mobility training;Therapeutic activities;Patient/family education;Therapeutic exercise;Manual techniques;Moist Heat;Balance training        Problem List Patient Active Problem List   Diagnosis Date Noted  . Major depressive disorder, recurrent, severe without psychotic features 03/16/2015  . Chest pressure 03/16/2015  . Absence of bladder continence 02/16/2015  . Obesity (BMI 30-39.9) 08/18/2014  . Right leg swelling 02/22/2014  . Hyperlipidemia 01/05/2014  . Insomnia 10/15/2013  . Headache 05/26/2013  . Abdominal pain, epigastric 05/26/2013  . Anemia 03/24/2013  . Morbid obesity 02/15/2013  . Medicare annual wellness visit, subsequent 12/21/2012  . Bradycardia 11/11/2012  . Cerebral aneurysm 10/30/2012  . S/P VP shunt 10/30/2012  . Hypothyroidism 10/23/2012  . Thyroid disease 09/23/2012  . Acute diastolic CHF (congestive heart failure) 05/18/2012  . 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 Edema 11/29/2011  . Duodenal ulcer, unspecified as acute or chronic, without hemorrhage, perforation, or obstruction 09/06/2011  . Other specified disorder of stomach and duodenum 09/06/2011  . Esophagitis 09/06/2011  . Duodenal ulcer disease 05/28/2011  . Fatty liver 05/28/2011  . Benign neoplasm of colon 05/13/2011  . Mass of colon 05/13/2011  . Duodenal mass 05/13/2011  . GERD (gastroesophageal reflux disease) 05/13/2011  . Abdominal pain, chronic, epigastric 05/13/2011  . Family history of malignant neoplasm of gastrointestinal tract 05/02/2011  . Esophageal reflux 05/02/2011  . Epigastric pain 05/02/2011  . Hypertension  05/02/2011  . History of kidney cancer 05/02/2011  . Breast cancer 05/02/2011  . MURMUR 09/13/2009  . GRAVES' DISEASE 09/12/2009  . Depression 09/12/2009  . MIGRAINE HEADACHE 09/12/2009  . CEREBRAL ANEURYSM 09/12/2009    Jerl Mina ,PT, DPT, E-RYT  05/03/2015, 1:09 PM  [place] MAIN [facility] SERVICES [address] [place], [place], [address] Phone: [phone]   Fax:  [phone]

## 2015-05-09 ENCOUNTER — Ambulatory Visit: Payer: Medicare Other | Admitting: Physical Therapy

## 2015-05-10 ENCOUNTER — Encounter: Payer: Self-pay | Admitting: *Deleted

## 2015-05-10 NOTE — Progress Notes (Signed)
Fax from [place], [place] for [place] approved through 05/09/17.

## 2015-05-11 ENCOUNTER — Encounter: Payer: Self-pay | Admitting: Nurse Practitioner

## 2015-05-11 ENCOUNTER — Ambulatory Visit (INDEPENDENT_AMBULATORY_CARE_PROVIDER_SITE_OTHER): Payer: Medicare Other | Admitting: Nurse Practitioner

## 2015-05-11 VITALS — BP 130/78 | HR 60 | Temp 98.0°F | Resp 14 | Ht 67.0 in | Wt 224.0 lb

## 2015-05-11 DIAGNOSIS — F329 Major depressive disorder, single episode, unspecified: Secondary | ICD-10-CM | POA: Diagnosis not present

## 2015-05-11 DIAGNOSIS — R609 Edema, unspecified: Secondary | ICD-10-CM | POA: Diagnosis not present

## 2015-05-11 DIAGNOSIS — E039 Hypothyroidism, unspecified: Secondary | ICD-10-CM

## 2015-05-11 DIAGNOSIS — R198 Other specified symptoms and signs involving the digestive system and abdomen: Secondary | ICD-10-CM | POA: Diagnosis not present

## 2015-05-11 DIAGNOSIS — I5031 Acute diastolic (congestive) heart failure: Secondary | ICD-10-CM

## 2015-05-11 DIAGNOSIS — F32A Depression, unspecified: Secondary | ICD-10-CM

## 2015-05-11 DIAGNOSIS — R194 Change in bowel habit: Secondary | ICD-10-CM

## 2015-05-11 LAB — TSH: TSH: 0.74 u[IU]/mL (ref 0.35–4.50)

## 2015-05-11 LAB — BASIC METABOLIC PANEL
BUN: 32 mg/dL — ABNORMAL HIGH (ref 6–23)
CO2: 27 meq/L (ref 19–32)
Calcium: 9.1 mg/dL (ref 8.4–10.5)
Chloride: 107 mEq/L (ref 96–112)
Creatinine, Ser: 1.52 mg/dL — ABNORMAL HIGH (ref 0.40–1.20)
GFR: 35.71 mL/min — ABNORMAL LOW (ref >60.00)
Glucose, Bld: 96 mg/dL (ref 70–99)
Potassium: 4.3 mEq/L (ref 3.5–5.1)
Sodium: 140 mEq/L (ref 135–145)

## 2015-05-11 LAB — BRAIN NATRIURETIC PEPTIDE: Pro B Natriuretic peptide (BNP): 187 pg/mL — ABNORMAL HIGH (ref 0.0–100.0)

## 2015-05-11 LAB — T4, FREE: Free T4: 0.91 ng/dL (ref 0.60–1.60)

## 2015-05-11 NOTE — Progress Notes (Signed)
   Subjective:    Patient ID: Cassidy Ortiz, female    DOB: [birth year], 74 y.o.   MRN: [id number]  HPI  Cassidy Ortiz is a 74 yo female with a CC of right and left leg swelling x 5 days.   1) Abilify 2 mg, Viibryd , Remeron     150 dollars a month, not helped, she wants to stop the medications.  2) Frequent bowel movents normal and small, loose stools, denies blood, 10 x a day or more, same amount of fiber as usual, cramping happens every time she goes  3) Leg swelling was not discussed other than they want to make sure her CHF isn't worsening.   Review of Systems  Constitutional: Negative for fever, chills, diaphoresis and fatigue.  Respiratory: Negative for chest tightness, shortness of breath and wheezing.   Cardiovascular: Positive for leg swelling. Negative for chest pain and palpitations.  Gastrointestinal: Positive for diarrhea. Negative for nausea and vomiting.  Skin: Negative for rash.  Neurological: Negative for dizziness, weakness, numbness and headaches.  Psychiatric/Behavioral: Positive for agitation. Negative for suicidal ideas and sleep disturbance. The patient is not nervous/anxious.       Objective:   Physical Exam  Constitutional: She is oriented to person, place, and time. She appears well-developed and well-nourished. No distress.  BP 130/78 mmHg  Pulse 60  Temp(Src) 98 F (36.7 C) (Oral)  Resp 14  Ht 5\' 7"  (1.702 m)  Wt 224 lb (101.606 kg)  BMI 35.08 kg/m2  SpO2 95%   HENT:  Head: Normocephalic and atraumatic.  Right Ear: External ear normal.  Left Ear: External ear normal.  Cardiovascular: Normal rate and regular rhythm.   Pulmonary/Chest: Effort normal and breath sounds normal. No respiratory distress. She has no wheezes. She has no rales. She exhibits no tenderness.  Neurological: She is alert and oriented to person, place, and time. No cranial nerve deficit. She exhibits normal muscle tone. Coordination normal.  Skin: Skin is warm and dry. No rash  noted. She is not diaphoretic.  Non-pitting edema of legs  Psychiatric: She has a normal mood and affect. Her behavior is normal. Judgment and thought content normal.      Assessment & Plan:

## 2015-05-11 NOTE — Patient Instructions (Addendum)
Please see Dr. Nicolasa Ducking for changing the Abilify and other medications.   We will contact you with results.

## 2015-05-11 NOTE — Progress Notes (Signed)
Pre visit review using our clinic review tool, if applicable. No additional management support is needed unless otherwise documented below in the visit note. 

## 2015-05-12 DIAGNOSIS — F332 Major depressive disorder, recurrent severe without psychotic features: Secondary | ICD-10-CM | POA: Diagnosis not present

## 2015-05-16 ENCOUNTER — Ambulatory Visit: Payer: Medicare Other | Attending: Urology | Admitting: Physical Therapy

## 2015-05-16 VITALS — BP 166/88

## 2015-05-16 DIAGNOSIS — R32 Unspecified urinary incontinence: Secondary | ICD-10-CM | POA: Diagnosis not present

## 2015-05-16 DIAGNOSIS — R279 Unspecified lack of coordination: Secondary | ICD-10-CM

## 2015-05-16 DIAGNOSIS — R278 Other lack of coordination: Secondary | ICD-10-CM | POA: Insufficient documentation

## 2015-05-16 DIAGNOSIS — N952 Postmenopausal atrophic vaginitis: Secondary | ICD-10-CM | POA: Insufficient documentation

## 2015-05-16 DIAGNOSIS — M6281 Muscle weakness (generalized): Secondary | ICD-10-CM

## 2015-05-16 DIAGNOSIS — R293 Abnormal posture: Secondary | ICD-10-CM

## 2015-05-16 NOTE — Patient Instructions (Signed)
Pelvic floor contraction with exhale, belly sinks in, pelvic floor lifts.  Faulty pattern: belly pushed out and pelvic floor bulge.

## 2015-05-16 NOTE — Therapy (Signed)
[place] MAIN [facility] SERVICES [address] [place], [place], [address] Phone: [phone]   Fax:  [phone]   PHYSICAL THERAPY DISCHARGE SUMMARY  Visits from Start of Care:  5 visits ([place]: O4/28/16)   Current functional level related to goals / functional outcomes: see below   Remaining deficits: see below   Education: see below Plan: Patient agrees to discharge.  Patient goals were partially met. STG: 100% met, LTG 75% met Patient is being discharged due to meeting the stated rehab goals.  ?????           Physical Therapy Treatment  Patient Details  Name: Cassidy Ortiz MRN: [id number] Date of Birth: 09-24-1941 Referring Provider:  Nori Riis, PA-C  Encounter Date: 05/16/2015      PT End of Session - 05/16/15 1129    Visit Number 5   Number of Visits 12   Date for PT Re-Evaluation 06/08/15   Authorization Type Medicare G-code every 10th visit, today 4/10   PT Start Time 1034   PT Stop Time 1110   PT Time Calculation (min) 36 min   Activity Tolerance Patient tolerated treatment well   Behavior During Therapy [facility] for tasks assessed/performed      Past Medical History  Diagnosis Date  . Vitamin D deficiency   . Mixed hyperlipidemia   . Anxiety state, unspecified   . Depressive disorder, not elsewhere classified   . Obstructive sleep apnea (adult) (pediatric)   . Hypertensive kidney disease, benign   . Cerebral aneurysm, nonruptured 2008    s/p coil and shunt, performed in [place]  . Duodenal ulcer   . Chronic kidney disease, stage III (moderate)   . Acquired cyst of kidney   . Abnormal weight gain   . Fatty liver   . Diverticulosis of colon (without mention of hemorrhage)   . Personal history of colonic polyps 10/22/2002    hyperplastic   . Hypertension   . Duodenal mass   . Baker's cyst of knee     Left, pt does not remember  . Edema   . SOB (shortness of breath)   . Malignant neoplasm of breast (female),  unspecified site 2001    right, s/p lumpectomy and XRT  . 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 Unspecified hypothyroidism   . Grave's disease   . Hypothyroid   . Sleep apnea     off cpap  . Cerebral aneurysm rupture     stent    Past Surgical History  Procedure Laterality Date  . Breast lumpectomy Right     right  . Partial nephrectomy      right  . Shoulder surgery      left  . Cataract extraction      bilateral  . Coronary stent placement      brain  . Femoral artery repair  2011  . Csf shunt  08/17/2008  . Hemiarthroplasty shoulder fracture      left  . Vaginal delivery      3  . Upper gastrointestinal endoscopy  11-10-2014  . Colonoscopy      last 2012.+ TA polyps    Filed Vitals:   05/16/15 1126  BP: 166/88    Visit Diagnosis:  Muscle weakness (generalized)  Abnormal posture  Lack of coordination      Subjective Assessment - 05/16/15 1038    Subjective Pt reported she had diarrhea for the past week  and she had medication changes the past weeks which caused swelling in her legs. Pt completed her sleep study but has not received results yet. Pt performs pelvic floor exercises constantly and feels sore.    Patient is accompained by:                          [facility] Adult PT Treatment/Exercise - 05/16/15 1100    Posture/Postural Control   Posture Comments Neuro-redu: pelvic floor contractions 10 sec hold,  6 reps hooklying, seated and hooklying  initailly showed abdominal.pelvic floor straining                PT Education - 05/16/15 1124    Education provided Yes   Education Details correct pelvic floor coordination in hooklying and seated position and with exercises. D/C plans: referred to [employer] program for wellness/strengthening and monitoring of cardiac conditions and walking at home for cardiovascular health.    Person(s) Educated Patient   Methods Explanation   Comprehension  Verbalized understanding          PT Short Term Goals - 05/16/15 1050    PT SHORT TERM GOAL #1   Title Pt decrease her tea intake/ day by 50% (from 4 cups to 2 cups/day)  and increase her water intake by 50% ( from 1 cup to 4 cups) in order to participate more community events with decreased frequency to urinate.   Baseline 5/11: pt reported she has decreased her to 1 cup/day and increased her water intake.   Time 4   Period Weeks   Status Achieved   PT SHORT TERM GOAL #2   Title Patient will be independent with completion of home exercise program for pelvic floor exercise, progressing to standing for all ADL's/activities.   Time 4   Period Weeks   Status Achieved           PT Long Term Goals - 05/16/15 1050    PT LONG TERM GOAL #1   Title Pt will report nocturia decrease from 3-4x/night to < 3x/night in order to improve sleep.    Baseline 5/12 Pt reported she stopped drinking liquids prior to bed and was able to sleep 8hr straight for 2 nights with 1x/ nocturia.    Time 12   Period Weeks   Status Achieved   PT LONG TERM GOAL #2   Title Pt will report no longer leaking upon arriving at her home and is able to make it her toilet for 2 occasions in order to demo improved urge and continent Sx.    Time 12   Period Weeks   Status Achieved   PT LONG TERM GOAL #3   Title Patient will score decrease in her PDFI UDI score from 58.33% to < 48% in order to demo improved QOL and function.  (DC 05/16/15: 50% )    Time 12   Period Weeks   Status Achieved   PT LONG TERM GOAL #4   Title Patient will not require more than 1 pads/day for protection against leaking.   Time 12   Period Weeks   Status Not Met               Plan - 05/16/15 1130    Clinical Impression Statement Despite pt's medical conditions, pt has met 100% of her STG and a 75% of her LTG with ability to coordinate pelvic floor muscles properly with increased strength and less straining with bowel  movements.  Although pt's pad use and leakage amount has not changed, pt has reported she is able to make it to the toilet without leaking, her nocturia frequency has decreased, and her score on UD- improved from 58% to 50%. Today, pt PT reviewed proper coordination technique of pelvic floor contractions to minimize soreness caused by improper coordination and pt demo'd correctly and understanding.  PT did not progress pt to overall strengthening/aerobic phase of POC due to pt's current fluctuations in BP and report of LE swelling due to medications (under MD monitoring). Pt was educated about wellness programs, guided to use [employer] program at [employer] as opposed to a community gym because she would benefit more from being monitored by exercise physiologists within a specialized program for individuals with cardiac conditions. Pt's info has been sent to program  coordinators to be in contact with her. Pt remains motivated to lose weight, improve sleep, and exercise. Pt was educated on deep core coordination with simulated [employer] exercises to continue optimizing her outcomes with pelvic floor rehab. Pt is ready for D/C.    Pt will benefit from skilled therapeutic intervention in order to improve on the following deficits Decreased coordination;Decreased endurance;Decreased skin integrity;Decreased mobility;Decreased strength;Pain;Improper body mechanics;Postural dysfunction   Rehab Potential Good   Clinical Impairments Affecting Rehab Potential co-morbidities   PT Treatment/Interventions ADLs/Self Care Home Management;Gait training;Neuromuscular re-education;Aquatic Therapy;Cryotherapy;Biofeedback;Functional mobility training;Therapeutic activities;Patient/family education;Therapeutic exercise;Manual techniques;Moist Heat;Balance training   PT Next Visit Plan D/C today   Consulted and Agree with Plan of Care Patient          G-Codes - 23-May-2015 1143    Functional Assessment Tool Used UDI-6   Functional  Limitation Self care   Self Care Current Status (O1157) At least 40 percent but less than 60 percent impaired, limited or restricted   Self Care Goal Status (W6203) At least 40 percent but less than 60 percent impaired, limited or restricted   Self Care Discharge Status [phone]) At least 40 percent but less than 60 percent impaired, limited or restricted  Pt's score decreased from 58% to 50% and reports she is able to make it the toilet without leakage.       Problem List Patient Active Problem List   Diagnosis Date Noted  . Major depressive disorder, recurrent, severe without psychotic features 03/16/2015  . Chest pressure 03/16/2015  . Absence of bladder continence 02/16/2015  . Obesity (BMI 30-39.9) 08/18/2014  . Right leg swelling 02/22/2014  . Hyperlipidemia 01/05/2014  . Insomnia 10/15/2013  . Headache 05/26/2013  . Abdominal pain, epigastric 05/26/2013  . Anemia 03/24/2013  . Morbid obesity 02/15/2013  . Medicare annual wellness visit, subsequent 12/21/2012  . Bradycardia 11/11/2012  . Cerebral aneurysm 10/30/2012  . S/P VP shunt 10/30/2012  . Hypothyroidism 10/23/2012  . Acute diastolic CHF (congestive heart failure) 05/18/2012  . 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 Edema 11/29/2011  . Duodenal ulcer, unspecified as acute or chronic, without hemorrhage, perforation, or obstruction 09/06/2011  . Other specified disorder of stomach and duodenum 09/06/2011  . Esophagitis 09/06/2011  . Duodenal ulcer disease 05/28/2011  . Fatty liver 05/28/2011  . Benign neoplasm of colon 05/13/2011  . Mass of colon 05/13/2011  . Duodenal mass 05/13/2011  . GERD (gastroesophageal reflux disease) 05/13/2011  . Family history of malignant neoplasm of gastrointestinal tract 05/02/2011  . Epigastric pain 05/02/2011  . Hypertension 05/02/2011  . History of kidney cancer 05/02/2011  . Breast cancer 05/02/2011  . MURMUR  09/13/2009  . GRAVES' DISEASE 09/12/2009  .  Depression 09/12/2009  . MIGRAINE HEADACHE 09/12/2009    Jerl Mina ,PT, DPT, E-RYT  05/16/2015, 11:45 AM  [place] [facility] MAIN [facility] SERVICES [address] [place], [place], [address] Phone: [phone]   Fax:  [phone]

## 2015-05-22 ENCOUNTER — Ambulatory Visit: Payer: Medicare Other | Admitting: Internal Medicine

## 2015-05-28 NOTE — Assessment & Plan Note (Signed)
Checking TSH and T4 in case it is out of range.

## 2015-05-28 NOTE — Assessment & Plan Note (Signed)
Pt wants to stop her medications. I asked her not to and since this visit does not provide adequate time to discuss- asked her to follow up with her PCP.

## 2015-05-28 NOTE — Assessment & Plan Note (Signed)
Stable. Will check BNP, BMET. Recent loose stools. Pt reports her swelling is improved today from last night.

## 2015-06-01 ENCOUNTER — Ambulatory Visit: Payer: Medicare Other | Attending: Otolaryngology

## 2015-06-01 DIAGNOSIS — F5101 Primary insomnia: Secondary | ICD-10-CM | POA: Diagnosis not present

## 2015-06-01 DIAGNOSIS — G2581 Restless legs syndrome: Secondary | ICD-10-CM | POA: Diagnosis not present

## 2015-06-01 DIAGNOSIS — G4733 Obstructive sleep apnea (adult) (pediatric): Secondary | ICD-10-CM | POA: Diagnosis not present

## 2015-06-01 DIAGNOSIS — F332 Major depressive disorder, recurrent severe without psychotic features: Secondary | ICD-10-CM | POA: Diagnosis not present

## 2015-06-20 DIAGNOSIS — E785 Hyperlipidemia, unspecified: Secondary | ICD-10-CM | POA: Diagnosis not present

## 2015-06-20 DIAGNOSIS — N2581 Secondary hyperparathyroidism of renal origin: Secondary | ICD-10-CM | POA: Diagnosis not present

## 2015-06-20 DIAGNOSIS — R809 Proteinuria, unspecified: Secondary | ICD-10-CM | POA: Diagnosis not present

## 2015-06-20 DIAGNOSIS — I10 Essential (primary) hypertension: Secondary | ICD-10-CM | POA: Diagnosis not present

## 2015-06-20 DIAGNOSIS — N183 Chronic kidney disease, stage 3 (moderate): Secondary | ICD-10-CM | POA: Diagnosis not present

## 2015-06-22 ENCOUNTER — Encounter: Payer: Self-pay | Admitting: Family

## 2015-06-22 ENCOUNTER — Other Ambulatory Visit (HOSPITAL_BASED_OUTPATIENT_CLINIC_OR_DEPARTMENT_OTHER): Payer: Medicare Other

## 2015-06-22 ENCOUNTER — Telehealth: Payer: Self-pay | Admitting: Internal Medicine

## 2015-06-22 ENCOUNTER — Ambulatory Visit (HOSPITAL_BASED_OUTPATIENT_CLINIC_OR_DEPARTMENT_OTHER): Payer: Medicare Other | Admitting: Family

## 2015-06-22 VITALS — BP 176/87 | HR 60 | Temp 98.0°F | Resp 16 | Ht 67.0 in | Wt 217.0 lb

## 2015-06-22 DIAGNOSIS — Z853 Personal history of malignant neoplasm of breast: Secondary | ICD-10-CM

## 2015-06-22 DIAGNOSIS — C50919 Malignant neoplasm of unspecified site of unspecified female breast: Secondary | ICD-10-CM | POA: Diagnosis not present

## 2015-06-22 DIAGNOSIS — D500 Iron deficiency anemia secondary to blood loss (chronic): Secondary | ICD-10-CM

## 2015-06-22 DIAGNOSIS — C50912 Malignant neoplasm of unspecified site of left female breast: Secondary | ICD-10-CM

## 2015-06-22 DIAGNOSIS — D509 Iron deficiency anemia, unspecified: Secondary | ICD-10-CM

## 2015-06-22 DIAGNOSIS — Z85528 Personal history of other malignant neoplasm of kidney: Secondary | ICD-10-CM

## 2015-06-22 LAB — CBC WITH DIFFERENTIAL (CANCER CENTER ONLY)
BASO#: 0 10*3/uL (ref 0.0–0.2)
BASO%: 0.3 % (ref 0.0–2.0)
EOS%: 2.5 % (ref 0.0–7.0)
Eosinophils Absolute: 0.2 10*3/uL (ref 0.0–0.5)
HCT: 40.6 % (ref 34.8–46.6)
HGB: 13.8 g/dL (ref 11.6–15.9)
LYMPH#: 1.4 10*3/uL (ref 0.9–3.3)
LYMPH%: 20.2 % (ref 14.0–48.0)
MCH: 30.5 pg (ref 26.0–34.0)
MCHC: 34 g/dL (ref 32.0–36.0)
MCV: 90 fL (ref 81–101)
MONO#: 0.6 10*3/uL (ref 0.1–0.9)
MONO%: 8.6 % (ref 0.0–13.0)
NEUT#: 4.6 10*3/uL (ref 1.5–6.5)
NEUT%: 68.4 % (ref 39.6–80.0)
Platelets: 154 10*3/uL (ref 145–400)
RBC: 4.53 10*6/uL (ref 3.70–5.32)
RDW: 14.5 % (ref 11.1–15.7)
WBC: 6.8 10*3/uL (ref 3.9–10.0)

## 2015-06-22 LAB — COMPREHENSIVE METABOLIC PANEL
ALBUMIN: 3.8 g/dL (ref 3.5–5.2)
ALT: 13 U/L (ref 0–35)
AST: 15 U/L (ref 0–37)
Alkaline Phosphatase: 74 U/L (ref 39–117)
BILIRUBIN TOTAL: 0.4 mg/dL (ref 0.2–1.2)
BUN: 28 mg/dL — ABNORMAL HIGH (ref 6–23)
CHLORIDE: 105 meq/L (ref 96–112)
CO2: 27 meq/L (ref 19–32)
Calcium: 9.3 mg/dL (ref 8.4–10.5)
Creatinine, Ser: 1.56 mg/dL — ABNORMAL HIGH (ref 0.50–1.10)
Glucose, Bld: 89 mg/dL (ref 70–99)
Potassium: 4.2 mEq/L (ref 3.5–5.3)
SODIUM: 142 meq/L (ref 135–145)
TOTAL PROTEIN: 6.3 g/dL (ref 6.0–8.3)

## 2015-06-22 LAB — CHCC SATELLITE - SMEAR

## 2015-06-22 NOTE — Progress Notes (Signed)
Hematology and Oncology Follow Up Visit  Cassidy Ortiz [id number] 1941-04-20 73 y.o. 06/22/2015   Principle Diagnosis:  1. 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 2. 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 3. Intermittent iron-deficiency anemia.  Current Therapy:   Observation    Interim History:  Cassidy Ortiz is here today for a follow-up. She is feeling better. She received Feraheme in May. Her iron saturation in February was 17% with a ferritin of 122.  She is seeing a psychiatrist and has started a new medication. She is feeling happier and has smiled several times while we talked. Much different from her last visit. She will start therapy sessions with a psychologist soon.  She has ordered a CPAP machine and is waiting for it to be delivered. She plans to use it nightly once it arrives. She denies fever, chills, n/v, cough, rash, dizziness, SOB, chest pain, palpitations, abdominal pain, changes in bowel or bladder habits. No blood in her urine or stool.  She has had no swelling, tenderness, numbness or tingling in her extremities. No new aches or pains.  She has a good appetite and is eating. She is well hydrated. Her weight is stable.  Her mammogram in October was negative.  Medications:    Medication List       This list is accurate as of: 06/22/15  2:56 PM.  Always use your most recent med list.               ALPRAZolam 0.25 MG tablet  Commonly known as:  XANAX  Take 1 tablet (0.25 mg total) by mouth 3 (three) times daily as needed for anxiety.     ARIPiprazole 2 MG tablet  Commonly known as:  ABILIFY  Take 2 mg by mouth daily.     aspirin 81 MG tablet  Take 81 mg by mouth daily. Takes 2 tablets daily     atorvastatin 40 MG tablet  Commonly known as:  LIPITOR  TAKE 1 TABLET DAILY     carvedilol 12.5 MG tablet  Commonly known as:  COREG  Take 1 tablet (12.5 mg total) by mouth 2 (two) times daily with a meal.     cloNIDine  0.1 MG tablet  Commonly known as:  CATAPRES  Take 1 tablet (0.1 mg total) by mouth 3 (three) times daily.     doxazosin 8 MG tablet  Commonly known as:  CARDURA  Take 1 tablet (8 mg total) by mouth daily.     hydrALAZINE 100 MG tablet  Commonly known as:  APRESOLINE  TAKE 1 TABLET 3 TIMES A DAY     lisinopril 10 MG tablet  Commonly known as:  PRINIVIL,ZESTRIL  Take 10 mg by mouth daily.     mirtazapine 30 MG tablet  Commonly known as:  REMERON  Take 30 mg by mouth at bedtime.     multivitamin tablet  Take 1 tablet by mouth daily.     NEXIUM PO  Take 20 mg by mouth every morning.     nitroGLYCERIN 0.4 MG SL tablet  Commonly known as:  NITROSTAT  Place 1 tablet (0.4 mg total) under the tongue every 5 (five) minutes as needed for chest pain.     rOPINIRole 1 MG tablet  Commonly known as:  REQUIP  Take 1 mg by mouth daily as needed.     SYNTHROID 175 MCG tablet  Generic drug:  levothyroxine  TAKE 1 TABLET BY  MOUTH EVERY DAY     VIIBRYD 40 MG Tabs  Generic drug:  Vilazodone HCl  TAKE 1 TABLET DAILY     zolpidem 12.5 MG CR tablet  Commonly known as:  AMBIEN CR  Take 12.5 mg by mouth at bedtime.        Allergies:  Allergies  Allergen Reactions  . Amlodipine     Leg swelling    Past Medical History, Surgical history, Social history, and Family History were reviewed and updated.  Review of Systems: All other 10 point review of systems is negative.   Physical Exam:  vitals were not taken for this visit.  Wt Readings from Last 3 Encounters:  05/11/15 224 lb (101.606 kg)  04/20/15 216 lb (97.977 kg)  04/05/15 211 lb 4 oz (95.822 kg)    Ocular: Sclerae unicteric, pupils equal, round and reactive to light Ear-nose-throat: Oropharynx clear, dentition fair Lymphatic: No cervical or supraclavicular adenopathy Lungs no rales or rhonchi, good excursion bilaterally Heart regular rate and rhythm, no murmur appreciated Abd soft, nontender, positive bowel  sounds MSK no focal spinal tenderness, no joint edema Neuro: non-focal, well-oriented, appropriate affect Breasts: No changes. No mass, lesion, rash or lymphadenopathy.   Lab Results  Component Value Date   WBC 6.8 06/22/2015   HGB 13.8 06/22/2015   HCT 40.6 06/22/2015   MCV 90 06/22/2015   PLT 154 06/22/2015   Lab Results  Component Value Date   FERRITIN 122 01/19/2015   IRON 36* 01/19/2015   TIBC 216* 01/19/2015   UIBC 181 01/19/2015   IRONPCTSAT 17* 01/19/2015   Lab Results  Component Value Date   RBC 4.53 06/22/2015   No results found for: KPAFRELGTCHN, LAMBDASER, KAPLAMBRATIO No results found for: Kandis Cocking, IGMSERUM No results found for: Odetta Pink, SPEI   Chemistry      Component Value Date/Time   NA 140 05/11/2015 1512   NA 144 04/03/2015 1427   NA 142 01/19/2015 1320   NA 142 07/06/2012 1153   K 4.3 05/11/2015 1512   K 3.9 04/03/2015 1427   K 3.7 01/19/2015 1320   CL 107 05/11/2015 1512   CL 111 04/03/2015 1427   CL 107 01/19/2015 1320   CO2 27 05/11/2015 1512   CO2 24 04/03/2015 1427   CO2 25 01/19/2015 1320   BUN 32* 05/11/2015 1512   BUN 31* 04/03/2015 1427   BUN 27* 01/19/2015 1320   BUN 24 07/06/2012 1153   CREATININE 1.52* 05/11/2015 1512   CREATININE 1.49* 04/03/2015 1427   CREATININE 1.3* 01/19/2015 1320      Component Value Date/Time   CALCIUM 9.1 05/11/2015 1512   CALCIUM 9.4 04/03/2015 1427   CALCIUM 9.3 01/19/2015 1320   ALKPHOS 70 04/20/2015 1407   ALKPHOS 171* 01/19/2015 1320   AST 14 04/20/2015 1407   AST 34 01/19/2015 1320   ALT 15 04/20/2015 1407   ALT 38 01/19/2015 1320   BILITOT 0.4 04/20/2015 1407   BILITOT 0.40 01/19/2015 1320     Impression and Plan: Cassidy Ortiz is 74 year old female with iron deficiency anemia. She also has a remote history of stage I ductal carcinoma of the right breast and of right renal cell carcinoma.  There has been no evidence of  recurrence.  Her depression is improving since adjustments were made to her medications. She is also seeing a Engineer, water.  She received Feraheme in May. We will see what her iron studies show and bring her  in next week for a dose if needed.  Once her CPAP arrives she will be wearing it each night.  We will plan to see her back in 2 months for labs and follow-up.  Both she and her husband know to call here with any questions or concerns. We can certainly see her sooner if need be.   Eliezer Bottom, NP 7/14/20162:56 PM

## 2015-06-22 NOTE — Telephone Encounter (Signed)
Faye with [facility] called to get progress notes to get approval for CPAP supplies. Please contact/msn

## 2015-06-23 LAB — IRON AND TIBC CHCC
%SAT: 33 % (ref 21–57)
Iron: 78 ug/dL (ref 41–142)
TIBC: 239 ug/dL (ref 236–444)
UIBC: 160 ug/dL (ref 120–384)

## 2015-06-23 LAB — FERRITIN CHCC: Ferritin: 44 ng/ml (ref 9–269)

## 2015-06-29 ENCOUNTER — Encounter: Payer: Self-pay | Admitting: Internal Medicine

## 2015-06-29 ENCOUNTER — Ambulatory Visit (INDEPENDENT_AMBULATORY_CARE_PROVIDER_SITE_OTHER): Payer: Medicare Other | Admitting: Internal Medicine

## 2015-06-29 VITALS — BP 98/60 | HR 58 | Temp 98.7°F | Ht 67.0 in | Wt 217.2 lb

## 2015-06-29 DIAGNOSIS — F332 Major depressive disorder, recurrent severe without psychotic features: Secondary | ICD-10-CM | POA: Diagnosis not present

## 2015-06-29 DIAGNOSIS — G4733 Obstructive sleep apnea (adult) (pediatric): Secondary | ICD-10-CM

## 2015-06-29 DIAGNOSIS — I10 Essential (primary) hypertension: Secondary | ICD-10-CM | POA: Diagnosis not present

## 2015-06-29 MED ORDER — ROPINIROLE HCL 1 MG PO TABS: 1.0000 mg | ORAL_TABLET | Freq: Every day | ORAL | Status: DC | PRN

## 2015-06-29 NOTE — Patient Instructions (Addendum)
Follow up in 3 months

## 2015-06-29 NOTE — Progress Notes (Signed)
Pre visit review using our clinic review tool, if applicable. No additional management support is needed unless otherwise documented below in the visit note. 

## 2015-06-29 NOTE — Assessment & Plan Note (Signed)
Recent sleep study showed sleep apnea. CPAP ordered for home.

## 2015-06-29 NOTE — Progress Notes (Signed)
   Subjective:    Patient ID: Cassidy Ortiz, female    DOB: 07-30-1941, 74 y.o.   MRN: [id number]  HPI  74YO female presents for follow up.  Feeling more tired last 2 days. No focal symptoms.  Started on Amlodipine by nephrologist. Did not start because of history of leg swelling.  Not sleeping well recently. Working on getting CPAP device set up.  Continues to follow up with Dr. Nicolasa Ducking. Planning to start counseling.  Past medical, surgical, family and social history per today's encounter.  Review of Systems  Constitutional: Positive for fatigue. Negative for fever, chills, appetite change and unexpected weight change.  Eyes: Negative for visual disturbance.  Respiratory: Negative for shortness of breath.   Cardiovascular: Negative for chest pain and leg swelling.  Gastrointestinal: Negative for nausea, vomiting, abdominal pain, diarrhea and constipation.  Skin: Negative for color change and rash.  Hematological: Negative for adenopathy. Does not bruise/bleed easily.  Psychiatric/Behavioral: Positive for dysphoric mood. Negative for suicidal ideas and sleep disturbance. The patient is not nervous/anxious.        Objective:    BP 98/60 mmHg  Pulse 58  Temp(Src) 98.7 F (37.1 C) (Oral)  Ht 5\' 7"  (1.702 m)  Wt 217 lb 4 oz (98.544 kg)  BMI 34.02 kg/m2  SpO2 91% Physical Exam  Constitutional: She is oriented to person, place, and time. She appears well-developed and well-nourished. No distress.  HENT:  Head: Normocephalic and atraumatic.  Right Ear: External ear normal.  Left Ear: External ear normal.  Nose: Nose normal.  Mouth/Throat: Oropharynx is clear and moist. No oropharyngeal exudate.  Eyes: Conjunctivae are normal. Pupils are equal, round, and reactive to light. Right eye exhibits no discharge. Left eye exhibits no discharge. No scleral icterus.  Neck: Normal range of motion. Neck supple. No tracheal deviation present. No thyromegaly present.  Cardiovascular: Normal  rate, regular rhythm, normal heart sounds and intact distal pulses.  Exam reveals no gallop and no friction rub.   No murmur heard. Pulmonary/Chest: Effort normal and breath sounds normal. No respiratory distress. She has no wheezes. She has no rales. She exhibits no tenderness.  Musculoskeletal: Normal range of motion. She exhibits no edema or tenderness.  Lymphadenopathy:    She has no cervical adenopathy.  Neurological: She is alert and oriented to person, place, and time. No cranial nerve deficit. She exhibits normal muscle tone. Coordination normal.  Skin: Skin is warm and dry. No rash noted. She is not diaphoretic. No erythema. No pallor.  Psychiatric: She has a normal mood and affect. Her speech is normal and behavior is normal. Judgment and thought content normal. She does not exhibit a depressed mood.          Assessment & Plan:   Problem List Items Addressed This Visit      Unprioritized   Hypertension    BP Readings from Last 3 Encounters:  06/29/15 98/60  06/22/15 176/87  05/16/15 166/88   BP well controlled, slightly lower today. Will continue current medications.      Major depressive disorder, recurrent, severe without psychotic features - Primary    Symptoms improving. Continue current medications. Continue follow up with Dr. Nicolasa Ducking.      OSA (obstructive sleep apnea)    Recent sleep study showed sleep apnea. CPAP ordered for home.          Return in about 3 months (around 09/29/2015) for Wellness Visit.

## 2015-06-29 NOTE — Assessment & Plan Note (Signed)
BP Readings from Last 3 Encounters:  06/29/15 98/60  06/22/15 176/87  05/16/15 166/88   BP well controlled, slightly lower today. Will continue current medications.

## 2015-06-29 NOTE — Assessment & Plan Note (Signed)
Symptoms improving. Continue current medications. Continue follow up with Dr. Nicolasa Ducking.

## 2015-08-14 ENCOUNTER — Other Ambulatory Visit: Payer: Self-pay | Admitting: Cardiovascular Disease

## 2015-08-23 ENCOUNTER — Encounter: Payer: Self-pay | Admitting: Family Medicine

## 2015-08-23 ENCOUNTER — Other Ambulatory Visit: Payer: Self-pay | Admitting: *Deleted

## 2015-08-23 ENCOUNTER — Ambulatory Visit (INDEPENDENT_AMBULATORY_CARE_PROVIDER_SITE_OTHER): Payer: Medicare Other | Admitting: Family Medicine

## 2015-08-23 ENCOUNTER — Telehealth: Payer: Self-pay | Admitting: Internal Medicine

## 2015-08-23 ENCOUNTER — Telehealth: Payer: Self-pay | Admitting: Hematology & Oncology

## 2015-08-23 VITALS — BP 122/69 | HR 59 | Temp 99.4°F | Ht 67.0 in | Wt 211.2 lb

## 2015-08-23 DIAGNOSIS — L723 Sebaceous cyst: Secondary | ICD-10-CM | POA: Diagnosis not present

## 2015-08-23 DIAGNOSIS — L089 Local infection of the skin and subcutaneous tissue, unspecified: Secondary | ICD-10-CM

## 2015-08-23 MED ORDER — TRAMADOL HCL 50 MG PO TABS: 50.0000 mg | ORAL_TABLET | Freq: Three times a day (TID) | ORAL | Status: DC | PRN

## 2015-08-23 MED ORDER — DOXYCYCLINE HYCLATE 100 MG PO TABS: 100.0000 mg | ORAL_TABLET | Freq: Two times a day (BID) | ORAL | Status: DC

## 2015-08-23 NOTE — Telephone Encounter (Signed)
Patient called and cx 08/24/15 apt and resch for 09/15/15

## 2015-08-23 NOTE — Progress Notes (Signed)
Subjective:  Patient ID: Cassidy Ortiz, female    DOB: 1941/01/27  Age: 74 y.o. MRN: [id number]  CC: Cyst  HPI:  74 year old female with a complicated PMH presents with complaints of a cyst on her back.  Patient reports that she has had cysts removed x from an area of her right upper back. She reports she recently developed a recurrence. It has been enlarging for the past few days.  She feels as if it is going to burst.  No associated fever, chills. No exacerbating or relieving factors. Patient desires removal.  Social Hx   Social History   Social History  . Marital Status: Married    Spouse Name: N/A  . Number of Children: 3  . Years of Education: N/A   Occupational History  . retired    Social History Main Topics  . Smoking status: Former Smoker -- 1.00 packs/day for 43 years    Types: Cigarettes    Start date: 04/01/1964    Quit date: 11/24/2007  . Smokeless tobacco: Never Used     Comment: quit smoking 7 years ago  . Alcohol Use: 0.6 oz/week    1 Glasses of wine per week     Comment: occasssionally  . Drug Use: No  . Sexual Activity: Not Asked   Other Topics Concern  . None   Social History Narrative   Lives in [place] with husband. From [place]. Children live [place] and [place].   Pets - 1 cat in home.      Work - retired Theme park manager, Educational psychologist   Diet - regular, limited calories         Review of Systems  Constitutional: Negative for fever and chills.  Skin:       Cystic lesion on back.   Objective:  BP 122/69 mmHg  Pulse 59  Temp(Src) 99.4 F (37.4 C) (Oral)  Ht 5\' 7"  (1.702 m)  Wt 211 lb 4 oz (95.822 kg)  BMI 33.08 kg/m2  SpO2 93%  BP/Weight 08/23/2015 06/29/2015 123XX123  Systolic BP 123XX123 98 0000000  Diastolic BP 69 60 87  Wt. (Lbs) 211.25 217.25 217  BMI 33.08 34.02 33.98   Physical Exam  Constitutional: She is oriented to person, place, and time. She appears well-developed and well-nourished. No distress.  Neurological: She is alert and oriented to person,  place, and time.  Skin:  3 x 3.25 cm fluctuant area of the right upper back. Surrounding erythema noted. Prior central scarring noted (from prior I & D or excision).  Psychiatric: She has a normal mood and affect.   Lab Results  Component Value Date   WBC 6.8 06/22/2015   HGB 13.8 06/22/2015   HCT 40.6 06/22/2015   PLT 154 06/22/2015   GLUCOSE 89 06/22/2015   CHOL 150 07/13/2014   TRIG 114.0 07/13/2014   HDL 58.20 07/13/2014   LDLCALC 69 07/13/2014   ALT 13 06/22/2015   AST 15 06/22/2015   NA 142 06/22/2015   K 4.2 06/22/2015   CL 105 06/22/2015   CREATININE 1.56* 06/22/2015   BUN 28* 06/22/2015   CO2 27 06/22/2015   TSH 0.74 05/11/2015   INR 0.94 07/24/2011   Incision and Drainage Procedure Note  Pre-operative Diagnosis: Infected sebaceous cyst.   Post-operative Diagnosis: same  Indications: Enlarging and infected sebaceous cyst.  Anesthesia: 2% lidocaine with epinephrine  Procedure Details  The procedure, risks and complications have been discussed in detail (including, but not limited to infection, bleeding) with the  patient, and the patient has signed consent to the procedure.  The skin was sterilely prepped in the usual fashion. After adequate local anesthesia, I&D with a #15 blade was performed. Purulent drainage and sebaceous material was removed from the lesion. The patient tolerated procedure well. Wound was packed with gauze and dressed with Telfa and gauze.  EBL: Minimal.  Condition: Tolerated procedure well  Complications: none.  Assessment & Plan:   Problem List Items Addressed This Visit    Infected sebaceous cyst - Primary    I & D performed today. Started patient on Doxycycline for prophylaxis. Follow up later this week for a wound re-check.        Meds ordered this encounter  Medications  . doxycycline (VIBRA-TABS) 100 MG tablet    Sig: Take 1 tablet (100 mg total) by mouth 2 (two) times daily.    Dispense:  14 tablet    Refill:  0      Follow-up: Next week for wound check  Cassidy Salt, Cassidy Ortiz

## 2015-08-23 NOTE — Assessment & Plan Note (Signed)
I & D performed today. Started patient on Doxycycline for prophylaxis. Follow up later this week for a wound re-check.

## 2015-08-23 NOTE — Telephone Encounter (Signed)
Pt request referral to surgery. Pt has boil/cyst on back.

## 2015-08-23 NOTE — Patient Instructions (Signed)
Take the antibiotic twice daily with food.  Please call if you develop fever, chills or anything concerning.  Follow up for a wound check on Friday.  Take care  Dr. Lacinda Axon

## 2015-08-23 NOTE — Progress Notes (Signed)
Pre visit review using our clinic review tool, if applicable. No additional management support is needed unless otherwise documented below in the visit note. 

## 2015-08-23 NOTE — Telephone Encounter (Signed)
Pt saw seen by Dr Lacinda Axon today, Dr Lacinda Axon removed cyst, no referral needed

## 2015-08-24 ENCOUNTER — Ambulatory Visit: Payer: Medicare Other | Admitting: Hematology & Oncology

## 2015-08-24 ENCOUNTER — Other Ambulatory Visit: Payer: Medicare Other

## 2015-08-25 ENCOUNTER — Ambulatory Visit (INDEPENDENT_AMBULATORY_CARE_PROVIDER_SITE_OTHER): Payer: Self-pay | Admitting: Family Medicine

## 2015-08-25 ENCOUNTER — Ambulatory Visit: Payer: Medicare Other | Admitting: Family Medicine

## 2015-08-25 ENCOUNTER — Encounter: Payer: Self-pay | Admitting: Family Medicine

## 2015-08-25 VITALS — BP 124/72 | HR 58 | Temp 98.7°F | Ht 67.0 in | Wt 212.4 lb

## 2015-08-25 DIAGNOSIS — L089 Local infection of the skin and subcutaneous tissue, unspecified: Secondary | ICD-10-CM

## 2015-08-25 DIAGNOSIS — L723 Sebaceous cyst: Secondary | ICD-10-CM

## 2015-08-25 NOTE — Patient Instructions (Signed)
Continue dressing changes.  Follow up next week.

## 2015-08-25 NOTE — Progress Notes (Signed)
Pre visit review using our clinic review tool, if applicable. No additional management support is needed unless otherwise documented below in the visit note. 

## 2015-08-25 NOTE — Assessment & Plan Note (Signed)
Wound is healing appropriately at this time. Wound was repacked and dressed today. Husband to continue daily packing and dressing changes. Patient to continue antibiotic. Follow-up the next week for a wound recheck.

## 2015-08-25 NOTE — Progress Notes (Signed)
Subjective:  Patient ID: Cassidy Ortiz, female    DOB: 07/13/1941  Age: 74 y.o. MRN: [id number]  CC: Wound check  HPI:  74 year old female presents to clinic today for a wound check.  Patient had I & D for an infected sebaceous cyst on 9/14.   She presented today for a wound check.  Her husband has been packing the wound since her last visit. He reports that there has been some drainage from the wound. Redness and swelling is markedly improved. She is doing well on antibiotic at this time. No recent fevers or chills. She is otherwise feeling well.   Social Hx   Social History   Social History  . Marital Status: Married    Spouse Name: N/A  . Number of Children: 3  . Years of Education: N/A   Occupational History  . retired    Social History Main Topics  . Smoking status: Former Smoker -- 1.00 packs/day for 43 years    Types: Cigarettes    Start date: 04/01/1964    Quit date: 11/24/2007  . Smokeless tobacco: Never Used     Comment: quit smoking 7 years ago  . Alcohol Use: 0.6 oz/week    1 Glasses of wine per week     Comment: occasssionally  . Drug Use: No  . Sexual Activity: Not Asked   Other Topics Concern  . None   Social History Narrative   Lives in [place] with husband. From [place]. Children live [place] and [place].   Pets - 1 cat in home.      Work - retired Theme park manager, Educational psychologist   Diet - regular, limited calories         Review of Systems  Constitutional: Negative for fever and chills.  Skin: Positive for wound.   Objective:  BP 124/72 mmHg  Pulse 58  Temp(Src) 98.7 F (37.1 C) (Oral)  Ht 5\' 7"  (1.702 m)  Wt 212 lb 6 oz (96.333 kg)  BMI 33.25 kg/m2  SpO2 90%  BP/Weight 08/25/2015 08/23/2015 [id number]  Systolic BP A999333 123XX123 98  Diastolic BP 72 69 60  Wt. (Lbs) 212.38 211.25 217.25  BMI 33.25 33.08 34.02    Physical Exam  Constitutional: She is oriented to person, place, and time. She appears well-developed and well-nourished. No distress.  Neurological: She  is alert and oriented to person, place, and time.  Skin:  R upper back near the midline - wound from I & D is clean. Minimal surrounding erythema. Packing was removed today and wound was repacked and dressed.    Lab Results  Component Value Date   WBC 6.8 06/22/2015   HGB 13.8 06/22/2015   HCT 40.6 06/22/2015   PLT 154 06/22/2015   GLUCOSE 89 06/22/2015   CHOL 150 07/13/2014   TRIG 114.0 07/13/2014   HDL 58.20 07/13/2014   LDLCALC 69 07/13/2014   ALT 13 06/22/2015   AST 15 06/22/2015   NA 142 06/22/2015   K 4.2 06/22/2015   CL 105 06/22/2015   CREATININE 1.56* 06/22/2015   BUN 28* 06/22/2015   CO2 27 06/22/2015   TSH 0.74 05/11/2015   INR 0.94 07/24/2011    Assessment & Plan:   Problem List Items Addressed This Visit    Infected sebaceous cyst - Primary    Wound is healing appropriately at this time. Wound was repacked and dressed today. Husband to continue daily packing and dressing changes. Patient to continue antibiotic. Follow-up the next week for a wound recheck.  Follow-up: Next week.  Thersa Salt, DO

## 2015-08-29 ENCOUNTER — Ambulatory Visit: Payer: Self-pay | Admitting: Surgery

## 2015-09-01 ENCOUNTER — Ambulatory Visit (INDEPENDENT_AMBULATORY_CARE_PROVIDER_SITE_OTHER): Payer: Self-pay | Admitting: Family Medicine

## 2015-09-01 ENCOUNTER — Encounter: Payer: Self-pay | Admitting: Family Medicine

## 2015-09-01 VITALS — BP 110/62 | HR 56 | Temp 99.1°F | Ht 67.0 in | Wt 210.4 lb

## 2015-09-01 DIAGNOSIS — L089 Local infection of the skin and subcutaneous tissue, unspecified: Secondary | ICD-10-CM

## 2015-09-01 DIAGNOSIS — L723 Sebaceous cyst: Secondary | ICD-10-CM

## 2015-09-01 NOTE — Assessment & Plan Note (Signed)
Healing well. I anticipate that it will close later next week. Husband to continue packing until there is no more open space. Patient is cleared from my perspective and does not need to see me for follow-up regarding this.

## 2015-09-01 NOTE — Progress Notes (Signed)
Pre visit review using our clinic review tool, if applicable. No additional management support is needed unless otherwise documented below in the visit note. 

## 2015-09-01 NOTE — Progress Notes (Signed)
   Subjective:  Patient ID: Cassidy Ortiz, female    DOB: 1941/04/08  Age: 74 y.o. MRN: [id number]  CC: Wound check  HPI: Patient had I & D for an infected sebaceous cyst on 9/14.  She presents today for a wound check. Wound is healing well. Husband continues to pack wound at home. She did have some drainage recently. She has finished her antibiotic course. No recent fever, chills. She is feeling well.  Social Hx   Social History   Social History  . Marital Status: Married    Spouse Name: N/A  . Number of Children: 3  . Years of Education: N/A   Occupational History  . retired    Social History Main Topics  . Smoking status: Former Smoker -- 1.00 packs/day for 43 years    Types: Cigarettes    Start date: 04/01/1964    Quit date: 11/24/2007  . Smokeless tobacco: Never Used     Comment: quit smoking 7 years ago  . Alcohol Use: 0.6 oz/week    1 Glasses of wine per week     Comment: occasssionally  . Drug Use: No  . Sexual Activity: Not Asked   Other Topics Concern  . None   Social History Narrative   Lives in [place] with husband. From [place]. Children live [place] and [place].   Pets - 1 cat in home.      Work - retired Theme park manager, Educational psychologist   Diet - regular, limited calories         Review of Systems  Constitutional: Negative for fever and chills.  Skin: Positive for wound.   Objective:  BP 110/62 mmHg  Pulse 56  Temp(Src) 99.1 F (37.3 C) (Oral)  Ht 5\' 7"  (1.702 m)  Wt 210 lb 6 oz (95.425 kg)  BMI 32.94 kg/m2  SpO2 95%  BP/Weight 09/01/2015 08/25/2015 [id number]  Systolic BP A999333 A999333 123XX123  Diastolic BP 62 72 69  Wt. (Lbs) 210.38 212.38 211.25  BMI 32.94 33.25 33.08   Physical Exam  Constitutional: She is oriented to person, place, and time. She appears well-developed and well-nourished. No distress.  Neurological: She is alert and oriented to person, place, and time.  Skin:  Wound - R upper back: Wound is healing well. No overlying erythema. Some drainage on exam  today. Wound was cleaned and repacked.  Psychiatric: She has a normal mood and affect.    Assessment & Plan:   Problem List Items Addressed This Visit    Infected sebaceous cyst - Primary    Healing well. I anticipate that it will close later next week. Husband to continue packing until there is no more open space. Patient is cleared from my perspective and does not need to see me for follow-up regarding this.         Follow-up: PRN  Thersa Salt, DO

## 2015-09-01 NOTE — Addendum Note (Signed)
Addended by: Coral Spikes on: 09/01/2015 02:17 PM   Modules accepted: Level of Service

## 2015-09-15 ENCOUNTER — Encounter: Payer: Self-pay | Admitting: Hematology & Oncology

## 2015-09-15 ENCOUNTER — Ambulatory Visit (HOSPITAL_BASED_OUTPATIENT_CLINIC_OR_DEPARTMENT_OTHER): Payer: Medicare Other | Admitting: Hematology & Oncology

## 2015-09-15 ENCOUNTER — Other Ambulatory Visit (HOSPITAL_BASED_OUTPATIENT_CLINIC_OR_DEPARTMENT_OTHER): Payer: Medicare Other

## 2015-09-15 ENCOUNTER — Ambulatory Visit (HOSPITAL_BASED_OUTPATIENT_CLINIC_OR_DEPARTMENT_OTHER): Payer: Medicare Other

## 2015-09-15 VITALS — BP 165/84 | HR 55 | Temp 97.9°F | Resp 16 | Ht 67.0 in | Wt 207.0 lb

## 2015-09-15 DIAGNOSIS — Z85528 Personal history of other malignant neoplasm of kidney: Secondary | ICD-10-CM

## 2015-09-15 DIAGNOSIS — D509 Iron deficiency anemia, unspecified: Secondary | ICD-10-CM

## 2015-09-15 DIAGNOSIS — Z23 Encounter for immunization: Secondary | ICD-10-CM

## 2015-09-15 DIAGNOSIS — C50012 Malignant neoplasm of nipple and areola, left female breast: Principal | ICD-10-CM

## 2015-09-15 DIAGNOSIS — C50011 Malignant neoplasm of nipple and areola, right female breast: Secondary | ICD-10-CM

## 2015-09-15 DIAGNOSIS — Z853 Personal history of malignant neoplasm of breast: Secondary | ICD-10-CM

## 2015-09-15 DIAGNOSIS — C50912 Malignant neoplasm of unspecified site of left female breast: Secondary | ICD-10-CM

## 2015-09-15 DIAGNOSIS — D500 Iron deficiency anemia secondary to blood loss (chronic): Secondary | ICD-10-CM

## 2015-09-15 LAB — CBC WITH DIFFERENTIAL (CANCER CENTER ONLY)
BASO#: 0 10*3/uL (ref 0.0–0.2)
BASO%: 0.5 % (ref 0.0–2.0)
EOS ABS: 0.2 10*3/uL (ref 0.0–0.5)
EOS%: 3.5 % (ref 0.0–7.0)
HEMATOCRIT: 39.4 % (ref 34.8–46.6)
HEMOGLOBIN: 13.2 g/dL (ref 11.6–15.9)
LYMPH#: 1.3 10*3/uL (ref 0.9–3.3)
LYMPH%: 23.1 % (ref 14.0–48.0)
MCH: 30 pg (ref 26.0–34.0)
MCHC: 33.5 g/dL (ref 32.0–36.0)
MCV: 90 fL (ref 81–101)
MONO#: 0.6 10*3/uL (ref 0.1–0.9)
MONO%: 9.5 % (ref 0.0–13.0)
NEUT%: 63.4 % (ref 39.6–80.0)
NEUTROS ABS: 3.7 10*3/uL (ref 1.5–6.5)
Platelets: 158 10*3/uL (ref 145–400)
RBC: 4.4 10*6/uL (ref 3.70–5.32)
RDW: 14.3 % (ref 11.1–15.7)
WBC: 5.8 10*3/uL (ref 3.9–10.0)

## 2015-09-15 LAB — COMPREHENSIVE METABOLIC PANEL (CC13)
ALBUMIN: 3.8 g/dL (ref 3.5–5.0)
ALK PHOS: 88 U/L (ref 40–150)
ALT: 15 U/L (ref 0–55)
AST: 16 U/L (ref 5–34)
Anion Gap: 7 mEq/L (ref 3–11)
BILIRUBIN TOTAL: 0.46 mg/dL (ref 0.20–1.20)
BUN: 28.6 mg/dL — AB (ref 7.0–26.0)
CO2: 27 mEq/L (ref 22–29)
Calcium: 9.4 mg/dL (ref 8.4–10.4)
Chloride: 113 mEq/L — ABNORMAL HIGH (ref 98–109)
Creatinine: 1.5 mg/dL — ABNORMAL HIGH (ref 0.6–1.1)
EGFR: 34 mL/min/{1.73_m2} — AB (ref >90)
GLUCOSE: 91 mg/dL (ref 70–140)
Potassium: 4.2 mEq/L (ref 3.5–5.1)
SODIUM: 147 meq/L — AB (ref 136–145)
TOTAL PROTEIN: 6.2 g/dL — AB (ref 6.4–8.3)

## 2015-09-15 LAB — CHCC SATELLITE - SMEAR

## 2015-09-15 MED ORDER — INFLUENZA VAC SPLIT QUAD 0.5 ML IM SUSY
0.5000 mL | PREFILLED_SYRINGE | Freq: Once | INTRAMUSCULAR | Status: AC
  Administered 2015-09-15: 0.5 mL via INTRAMUSCULAR
  Filled 2015-09-15: qty 0.5

## 2015-09-15 NOTE — Progress Notes (Signed)
Hematology and Oncology Follow Up Visit  Cassidy Ortiz [id number] Dec 10, 1940 74 y.o. 09/15/2015   Principle Diagnosis:  1. 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 2. 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 3. Intermittent iron-deficiency anemia.  Current Therapy:    observation     Interim History:  Ms.  Ortiz is back for followup. She looks a lot better. She is exercising. She has lost some weight.  She is using her CPAP machine. She does not like using it but she feels that it is making her feel more active.  Unfortunately, they did lose their [place] couple weeks ago. They had to let her go because she was declining and not eating.  We last saw her in July, her ferritin was 44 with an iron saturation of 33%.  She has had no change in medications.  She's had no change in bowel or bladder habits.  His been no fever. She's had no rashes. She's had no leg swelling.   Medications:  Current outpatient prescriptions:  .  ALPRAZolam (XANAX) 0.25 MG tablet, Take 1 tablet (0.25 mg total) by mouth 3 (three) times daily as needed for anxiety., Disp: 60 tablet, Rfl: 4 .  aspirin 81 MG tablet, Take 81 mg by mouth daily. Takes 2 tablets daily, Disp: , Rfl:  .  atorvastatin (LIPITOR) 40 MG tablet, TAKE 1 TABLET DAILY, Disp: 90 tablet, Rfl: 3 .  buPROPion (WELLBUTRIN SR) 100 MG 12 hr tablet, Take 100 mg by mouth daily., Disp: , Rfl: 2 .  carvedilol (COREG) 12.5 MG tablet, Take 1 tablet (12.5 mg total) by mouth 2 (two) times daily with a meal., Disp: 180 tablet, Rfl: 3 .  doxazosin (CARDURA) 8 MG tablet, Take 1 tablet (8 mg total) by mouth daily. (Patient taking differently: Take 4 mg by mouth daily. ), Disp: 90 tablet, Rfl: 3 .  doxycycline (VIBRA-TABS) 100 MG tablet, Take 1 tablet (100 mg total) by mouth 2 (two) times daily., Disp: 14 tablet, Rfl: 0 .  Esomeprazole Magnesium (NEXIUM PO), Take 20 mg by mouth every morning. , Disp: , Rfl:  .  hydrALAZINE  (APRESOLINE) 100 MG tablet, TAKE 1 TABLET 3 TIMES A DAY, Disp: 270 tablet, Rfl: 3 .  lisinopril (PRINIVIL,ZESTRIL) 10 MG tablet, Take 10 mg by mouth daily., Disp: , Rfl:  .  mirtazapine (REMERON) 30 MG tablet, Take 30 mg by mouth at bedtime., Disp: , Rfl:  .  Multiple Vitamin (MULTIVITAMIN) tablet, Take 1 tablet by mouth daily.  , Disp: , Rfl:  .  nitroGLYCERIN (NITROSTAT) 0.4 MG SL tablet, Place 1 tablet (0.4 mg total) under the tongue every 5 (five) minutes as needed for chest pain., Disp: 25 tablet, Rfl: 3 .  rOPINIRole (REQUIP) 1 MG tablet, Take 1 tablet (1 mg total) by mouth daily as needed., Disp: 30 tablet, Rfl: 1 .  SYNTHROID 175 MCG tablet, TAKE 1 TABLET BY MOUTH EVERY DAY, Disp: 90 tablet, Rfl: 3 .  traMADol (ULTRAM) 50 MG tablet, Take 1 tablet (50 mg total) by mouth every 8 (eight) hours as needed., Disp: 30 tablet, Rfl: 0 .  zolpidem (AMBIEN) 10 MG tablet, Take 10 mg by mouth at bedtime as needed. for sleep, Disp: , Rfl: 2 .  cloNIDine (CATAPRES) 0.1 MG tablet, Take 1 tablet (0.1 mg total) by mouth 3 (three) times daily., Disp: 270 tablet, Rfl: 3  Current facility-administered medications:  .  Influenza vac split quadrivalent PF (FLUARIX) injection  0.5 mL, 0.5 mL, Intramuscular, Once, Volanda Napoleon, MD  Facility-Administered Medications Ordered in Other Visits:  .  0.9 %  sodium chloride infusion, , Intravenous, Continuous, Volanda Napoleon, MD, Stopped at 04/20/15 1651  Allergies:  Allergies  Allergen Reactions  . Amlodipine     Leg swelling    Past Medical History, Surgical history, Social history, and Family History were reviewed and updated.  Review of Systems: As above  Physical Exam:  height is 5\' 7"  (1.702 m) and weight is 207 lb (93.895 kg). Her oral temperature is 97.9 F (36.6 C). Her blood pressure is 165/84 and her pulse is 55. Her respiration is 16.   Well-developed and well-nourished white female. Head and neck exam shows no ocular or oral lesions. She has  no palpable cervical or supraclavicular lymph nodes. Lungs are clear. Cardiac exam regular in rhythm with no murmurs rubs or bruits. Abdomen is soft. She is good bowel sounds. There is no fluid wave. There is no guarding or rebound tenderness. There is no palpable liver or spleen tip.extremities shows the compression stockings bilaterally. She has minimal edema in her legs. No erythema is noted. She good range of motion and strength. Skin exam no rashes. Neurological exam is non-focal.breast exam shows left breast no masses edema or erythema. There is no left axillary adenopathy. Right breast shows contraction of the tissue. She has the nipple inversion which is chronic. She has some radiation changes. She has no obvious mass in the right breast. There is no right axillary adenopathy.   Lab Results  Component Value Date   WBC 5.8 09/15/2015   HGB 13.2 09/15/2015   HCT 39.4 09/15/2015   MCV 90 09/15/2015   PLT 158 09/15/2015     Chemistry      Component Value Date/Time   NA 142 06/22/2015 1444   NA 144 04/03/2015 1427   NA 142 01/19/2015 1320   NA 142 07/06/2012 1153   K 4.2 06/22/2015 1444   K 3.9 04/03/2015 1427   K 3.7 01/19/2015 1320   CL 105 06/22/2015 1444   CL 111 04/03/2015 1427   CL 107 01/19/2015 1320   CO2 27 06/22/2015 1444   CO2 24 04/03/2015 1427   CO2 25 01/19/2015 1320   BUN 28* 06/22/2015 1444   BUN 31* 04/03/2015 1427   BUN 27* 01/19/2015 1320   BUN 24 07/06/2012 1153   CREATININE 1.56* 06/22/2015 1444   CREATININE 1.49* 04/03/2015 1427   CREATININE 1.3* 01/19/2015 1320      Component Value Date/Time   CALCIUM 9.3 06/22/2015 1444   CALCIUM 9.4 04/03/2015 1427   CALCIUM 9.3 01/19/2015 1320   ALKPHOS 74 06/22/2015 1444   ALKPHOS 171* 01/19/2015 1320   AST 15 06/22/2015 1444   AST 34 01/19/2015 1320   ALT 13 06/22/2015 1444   ALT 38 01/19/2015 1320   BILITOT 0.4 06/22/2015 1444   BILITOT 0.40 01/19/2015 1320         Impression and Plan: Cassidy Ortiz is  74 year old female. She has remote history of stage I ductal carcinoma of the right breast.she has a past history of right renal cell carcinoma. Again I don't think these are a problem.   She really looks a lot better. She feels better. She's lost some weight. She's exercising more.  I do feel bad that he lost their cat a couple weeks ago. The had to let her go because she was not eating.  Otherwise, I don't think  that we had really do anything with her right now.  Think we are probably get her back to see [place] in 6 months. I think that would be very reasonable...  Volanda Napoleon, MD 10/7/20162:09 PM

## 2015-09-15 NOTE — Patient Instructions (Signed)

## 2015-09-18 LAB — IRON AND TIBC CHCC
%SAT: 36 % (ref 21–57)
Iron: 79 ug/dL (ref 41–142)
TIBC: 220 ug/dL — AB (ref 236–444)
UIBC: 142 ug/dL (ref 120–384)

## 2015-09-18 LAB — FERRITIN CHCC: Ferritin: 53 ng/ml (ref 9–269)

## 2015-09-21 DIAGNOSIS — G4700 Insomnia, unspecified: Secondary | ICD-10-CM | POA: Diagnosis not present

## 2015-09-21 DIAGNOSIS — F332 Major depressive disorder, recurrent severe without psychotic features: Secondary | ICD-10-CM | POA: Diagnosis not present

## 2015-10-02 ENCOUNTER — Encounter: Payer: Self-pay | Admitting: Internal Medicine

## 2015-10-02 ENCOUNTER — Ambulatory Visit (INDEPENDENT_AMBULATORY_CARE_PROVIDER_SITE_OTHER): Payer: Medicare Other | Admitting: Internal Medicine

## 2015-10-02 VITALS — BP 137/85 | HR 61 | Temp 98.3°F | Ht 67.0 in | Wt 207.0 lb

## 2015-10-02 DIAGNOSIS — E669 Obesity, unspecified: Secondary | ICD-10-CM | POA: Diagnosis not present

## 2015-10-02 DIAGNOSIS — R197 Diarrhea, unspecified: Secondary | ICD-10-CM

## 2015-10-02 DIAGNOSIS — L309 Dermatitis, unspecified: Secondary | ICD-10-CM

## 2015-10-02 DIAGNOSIS — I10 Essential (primary) hypertension: Secondary | ICD-10-CM | POA: Diagnosis not present

## 2015-10-02 MED ORDER — TRIAMCINOLONE ACETONIDE 0.1 % EX CREA: 1.0000 "application " | TOPICAL_CREAM | Freq: Two times a day (BID) | CUTANEOUS | Status: DC

## 2015-10-02 NOTE — Assessment & Plan Note (Signed)
Wt Readings from Last 3 Encounters:  10/02/15 207 lb (93.895 kg)  09/15/15 207 lb (93.895 kg)  09/01/15 210 lb 6 oz (95.425 kg)   Congratulated patient on weight loss. Encouraged continued healthy diet and exercise.

## 2015-10-02 NOTE — Patient Instructions (Addendum)
  Start Triamcinolone cream to face twice daily for one week.  Continue healthy diet and exercise.

## 2015-10-02 NOTE — Progress Notes (Signed)
Subjective:    Patient ID: Cassidy Ortiz, female    DOB: July 03, 1941, 74 y.o.   MRN: [id number]  HPI  74YO female presents for follow up.  Rash - over face at site of CPAP mask. Trying to wear a barrier with no improvement. Rash gets red and dry.  Aside from this, feeling well. Working out at [employer] with bike, [employer] and [employer]. Bkfst - eggs toast Lunch - tuna or [place] on lettuce Dinner - half sweet potato and chicken or fish  Diarrhea - about 1 time per month, has episode of watery diarrhea. No persistent abdominal pain. Cannot link to any particular food. No blood in stool. No mucous in stool. Not taking anything for this.   Wt Readings from Last 3 Encounters:  10/02/15 207 lb (93.895 kg)  09/15/15 207 lb (93.895 kg)  09/01/15 210 lb 6 oz (95.425 kg)   BP Readings from Last 3 Encounters:  10/02/15 137/85  09/15/15 165/84  09/01/15 110/62    Past Medical History  Diagnosis Date  . Vitamin D deficiency   . Mixed hyperlipidemia   . Anxiety state, unspecified   . Depressive disorder, not elsewhere classified   . Obstructive sleep apnea (adult) (pediatric)   . Hypertensive kidney disease, benign   . Cerebral aneurysm, nonruptured 2008    s/p coil and shunt, performed in [place]  . Duodenal ulcer   . Chronic kidney disease, stage III (moderate)   . Acquired cyst of kidney   . Abnormal weight gain   . Fatty liver   . Diverticulosis of colon (without mention of hemorrhage)   . Personal history of colonic polyps 10/22/2002    hyperplastic   . Hypertension   . Duodenal mass   . Baker's cyst of knee     Left, pt does not remember  . Edema   . SOB (shortness of breath)   . Malignant neoplasm of breast (female), unspecified site 2001    right, s/p lumpectomy and XRT  . 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 Unspecified hypothyroidism   . Grave's disease   . Hypothyroid   . Sleep apnea     off cpap  . Cerebral aneurysm  rupture (HCC)     stent   Family History  Problem Relation Age of Onset  . Colon cancer Sister   . Cancer Sister     colon  . Heart disease Father   . Esophageal cancer Neg Hx   . Rectal cancer Neg Hx   . Stomach cancer Neg Hx    Past Surgical History  Procedure Laterality Date  . Breast lumpectomy Right     right  . Partial nephrectomy      right  . Shoulder surgery      left  . Cataract extraction      bilateral  . Coronary stent placement      brain  . Femoral artery repair  2011  . Csf shunt  08/17/2008  . Hemiarthroplasty shoulder fracture      left  . Vaginal delivery      3  . Upper gastrointestinal endoscopy  11-10-2014  . Colonoscopy      last 2012.+ TA polyps   Social History   Social History  . Marital Status: Married    Spouse Name: N/A  . Number of Children: 3  . Years of Education: N/A   Occupational History  . retired  Social History Main Topics  . Smoking status: Former Smoker -- 1.00 packs/day for 43 years    Types: Cigarettes    Start date: 04/01/1964    Quit date: 11/24/2007  . Smokeless tobacco: Never Used     Comment: quit smoking 7 years ago  . Alcohol Use: 0.6 oz/week    1 Glasses of wine per week     Comment: occasssionally  . Drug Use: No  . Sexual Activity: Not Asked   Other Topics Concern  . None   Social History Narrative   Lives in [place] with husband. From [place]. Children live [place] and [place].   Pets - 1 cat in home.      Work - retired Theme park manager, Educational psychologist   Diet - regular, limited calories          Review of Systems  Constitutional: Negative for fever, chills, appetite change, fatigue and unexpected weight change.  Eyes: Negative for visual disturbance.  Respiratory: Negative for shortness of breath.   Cardiovascular: Negative for chest pain, palpitations and leg swelling.  Gastrointestinal: Positive for diarrhea. Negative for nausea, vomiting, abdominal pain and constipation.  Musculoskeletal: Negative for myalgias  and arthralgias.  Skin: Positive for color change and rash.  Hematological: Negative for adenopathy. Does not bruise/bleed easily.  Psychiatric/Behavioral: Negative for suicidal ideas, sleep disturbance and dysphoric mood. The patient is not nervous/anxious.        Objective:    BP 137/85 mmHg  Pulse 61  Temp(Src) 98.3 F (36.8 C) (Oral)  Ht 5\' 7"  (1.702 m)  Wt 207 lb (93.895 kg)  BMI 32.41 kg/m2  SpO2 94% Physical Exam  Constitutional: She is oriented to person, place, and time. She appears well-developed and well-nourished. No distress.  HENT:  Head: Normocephalic and atraumatic.  Right Ear: External ear normal.  Left Ear: External ear normal.  Nose: Nose normal.  Mouth/Throat: Oropharynx is clear and moist. No oropharyngeal exudate.  Eyes: Conjunctivae are normal. Pupils are equal, round, and reactive to light. Right eye exhibits no discharge. Left eye exhibits no discharge. No scleral icterus.  Neck: Normal range of motion. Neck supple. No tracheal deviation present. No thyromegaly present.  Cardiovascular: Normal rate, regular rhythm, normal heart sounds and intact distal pulses.  Exam reveals no gallop and no friction rub.   No murmur heard. Pulmonary/Chest: Effort normal and breath sounds normal. No respiratory distress. She has no wheezes. She has no rales. She exhibits no tenderness.  Musculoskeletal: Normal range of motion. She exhibits no edema or tenderness.  Lymphadenopathy:    She has no cervical adenopathy.  Neurological: She is alert and oriented to person, place, and time. No cranial nerve deficit. She exhibits normal muscle tone. Coordination normal.  Skin: Skin is warm and dry. Rash noted. Rash is maculopapular (erythematous over nose and cheeks). She is not diaphoretic. No erythema. No pallor.  Psychiatric: She has a normal mood and affect. Her behavior is normal. Judgment and thought content normal.          Assessment & Plan:   Problem List Items  Addressed This Visit      Unprioritized   Dermatitis - Primary    Dermatitis at site of CPAP mask skin irritation. Will start Triamcinolone. Discussed risks of using this medication longer than 1 week. She will call if symptoms are persistent.      Relevant Medications   triamcinolone cream (KENALOG) 0.1 %   Diarrhea    Watery diarrhea intermittent. Likely triggered by certain foods.  Discussed avoiding dairy, artificial sweeteners. Encouraged her to keep a diary of her symptoms. Follow up in 4 weeks.      Hypertension    BP Readings from Last 3 Encounters:  10/02/15 137/85  09/15/15 165/84  09/01/15 110/62   BP well controlled. Continue current medication. Renal function with labs today.      Relevant Orders   Comprehensive metabolic panel   Obesity (BMI 30-39.9)    Wt Readings from Last 3 Encounters:  10/02/15 207 lb (93.895 kg)  09/15/15 207 lb (93.895 kg)  09/01/15 210 lb 6 oz (95.425 kg)   Congratulated patient on weight loss. Encouraged continued healthy diet and exercise.          Return in about 4 weeks (around 10/30/2015) for Recheck.

## 2015-10-02 NOTE — Assessment & Plan Note (Signed)
Dermatitis at site of CPAP mask skin irritation. Will start Triamcinolone. Discussed risks of using this medication longer than 1 week. She will call if symptoms are persistent.

## 2015-10-02 NOTE — Progress Notes (Signed)
Pre visit review using our clinic review tool, if applicable. No additional management support is needed unless otherwise documented below in the visit note. 

## 2015-10-02 NOTE — Assessment & Plan Note (Signed)
BP Readings from Last 3 Encounters:  10/02/15 137/85  09/15/15 165/84  09/01/15 110/62   BP well controlled. Continue current medication. Renal function with labs today.

## 2015-10-02 NOTE — Assessment & Plan Note (Signed)
Watery diarrhea intermittent. Likely triggered by certain foods. Discussed avoiding dairy, artificial sweeteners. Encouraged her to keep a diary of her symptoms. Follow up in 4 weeks.

## 2015-10-03 ENCOUNTER — Other Ambulatory Visit: Payer: Self-pay | Admitting: Cardiovascular Disease

## 2015-10-03 LAB — COMPREHENSIVE METABOLIC PANEL
ALBUMIN: 3.9 g/dL (ref 3.5–5.2)
ALK PHOS: 81 U/L (ref 39–117)
ALT: 17 U/L (ref 0–35)
AST: 22 U/L (ref 0–37)
BILIRUBIN TOTAL: 0.3 mg/dL (ref 0.2–1.2)
BUN: 36 mg/dL — AB (ref 6–23)
CALCIUM: 9.6 mg/dL (ref 8.4–10.5)
CO2: 28 meq/L (ref 19–32)
Chloride: 107 mEq/L (ref 96–112)
Creatinine, Ser: 1.46 mg/dL — ABNORMAL HIGH (ref 0.40–1.20)
GFR: 37.36 mL/min — ABNORMAL LOW (ref >60.00)
GLUCOSE: 125 mg/dL — AB (ref 70–99)
POTASSIUM: 4.2 meq/L (ref 3.5–5.1)
Sodium: 144 mEq/L (ref 135–145)
TOTAL PROTEIN: 6.7 g/dL (ref 6.0–8.3)

## 2015-10-04 ENCOUNTER — Other Ambulatory Visit: Payer: Self-pay | Admitting: Cardiovascular Disease

## 2015-10-05 ENCOUNTER — Other Ambulatory Visit: Payer: Self-pay

## 2015-10-05 ENCOUNTER — Telehealth: Payer: Self-pay | Admitting: Internal Medicine

## 2015-10-05 MED ORDER — LEVOTHYROXINE SODIUM 175 MCG PO TABS: 175.0000 ug | ORAL_TABLET | Freq: Every day | ORAL | Status: DC

## 2015-10-05 NOTE — Telephone Encounter (Signed)
Left message that refill was completed.

## 2015-10-05 NOTE — Telephone Encounter (Signed)
Pt need a refill on Synthroid 175 mcg sent to [place]. Please advise pt when sent

## 2015-10-23 ENCOUNTER — Ambulatory Visit (INDEPENDENT_AMBULATORY_CARE_PROVIDER_SITE_OTHER): Payer: Medicare Other | Admitting: Urology

## 2015-10-23 ENCOUNTER — Encounter: Payer: Self-pay | Admitting: Urology

## 2015-10-23 VITALS — BP 177/89 | HR 64 | Ht 67.0 in | Wt 201.0 lb

## 2015-10-23 DIAGNOSIS — N3946 Mixed incontinence: Secondary | ICD-10-CM

## 2015-10-23 DIAGNOSIS — N952 Postmenopausal atrophic vaginitis: Secondary | ICD-10-CM

## 2015-10-23 LAB — URINALYSIS, COMPLETE
BILIRUBIN UA: NEGATIVE
Glucose, UA: NEGATIVE
KETONES UA: NEGATIVE
LEUKOCYTES UA: NEGATIVE
Nitrite, UA: NEGATIVE
PH UA: 5 (ref 5.0–7.5)
RBC UA: NEGATIVE
SPEC GRAV UA: 1.02 (ref 1.005–1.030)
Urobilinogen, Ur: 0.2 mg/dL (ref 0.2–1.0)

## 2015-10-23 LAB — MICROSCOPIC EXAMINATION
Bacteria, UA: NONE SEEN
RBC MICROSCOPIC, UA: NONE SEEN /HPF (ref 0–?)

## 2015-10-23 LAB — BLADDER SCAN AMB NON-IMAGING

## 2015-10-23 NOTE — Progress Notes (Signed)
10/23/2015 2:18 PM   Cassidy Ortiz [birth year] [id number]  Referring provider: Jackolyn Confer, MD [id number] [place], [place] [address]  Chief Complaint  Patient presents with  . Urinary Incontinence    HPI: Mrs. Saleeby is a 74 yo WF who returns in fu for his history of mixed incontinence.   She has wears 5 large pads daily.  She has urgency with UUI.  She also has SUI with coughing and sneezing.  She has been working on weight loss and doesn't drink much fluid.   She has Ortiz  In the door urgency and maps restrooms.  She has frequency and nocturia x1-3.  She has a good stream and she feels like she is emptying well.  She has had no dysuria or hematuria.   Her only treatment is vaginal cream and PT.  The Kegels don't help.  She has not had UTI's or GU surgery.    PMH: Past Medical History  Diagnosis Date  . Vitamin D deficiency   . Mixed hyperlipidemia   . Anxiety state, unspecified   . Depressive disorder, not elsewhere classified   . Obstructive sleep apnea (adult) (pediatric)   . Hypertensive kidney disease, benign   . Cerebral aneurysm, nonruptured 2008    s/p coil and shunt, performed in [place]  . Duodenal ulcer   . Chronic kidney disease, stage III (moderate)   . Acquired cyst of kidney   . Abnormal weight gain   . Fatty liver   . Diverticulosis of colon (without mention of hemorrhage)   . Personal history of colonic polyps 10/22/2002    hyperplastic   . Hypertension   . Duodenal mass   . Baker's cyst of knee     Left, pt does not remember  . Edema   . SOB (shortness of breath)   . Malignant neoplasm of breast (female), unspecified site 2001    right, s/p lumpectomy and XRT  . 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 Unspecified hypothyroidism   . Grave's disease   . Hypothyroid   . Sleep apnea     off cpap  . Cerebral aneurysm rupture [facility])     stent    Surgical History: Past Surgical History    Procedure Laterality Date  . Breast lumpectomy Right     right  . Partial nephrectomy      right  . Shoulder surgery      left  . Cataract extraction      bilateral  . Coronary stent placement      brain  . Femoral artery repair  2011  . Csf shunt  08/17/2008  . Hemiarthroplasty shoulder fracture      left  . Vaginal delivery      3  . Upper gastrointestinal endoscopy  11-10-2014  . Colonoscopy      last 2012.+ TA polyps    Home Medications:    Medication List       This list is accurate as of: 10/23/15  2:18 PM.  Always use your most recent med list.               ALPRAZolam 0.25 MG tablet  Commonly known as:  XANAX  Take 1 tablet (0.25 mg total) by mouth 3 (three) times daily as needed for anxiety.     AMBIEN 10 MG tablet  Generic drug:  zolpidem  Take 10 mg by mouth at bedtime as needed  for sleep.     aspirin 81 MG tablet  Take 81 mg by mouth daily. Takes 2 tablets daily     atorvastatin 40 MG tablet  Commonly known as:  LIPITOR  TAKE 1 TABLET DAILY     buPROPion 100 MG 12 hr tablet  Commonly known as:  WELLBUTRIN SR  Take 100 mg by mouth daily.     carvedilol 12.5 MG tablet  Commonly known as:  COREG  Take 1 tablet (12.5 mg total) by mouth 2 (two) times daily with a meal.     cloNIDine 0.1 MG tablet  Commonly known as:  CATAPRES  TAKE 1 TABLET 3 TIMES A DAY     doxazosin 8 MG tablet  Commonly known as:  CARDURA  Take 1 tablet (8 mg total) by mouth daily.     hydrALAZINE 100 MG tablet  Commonly known as:  APRESOLINE  TAKE 1 TABLET 3 TIMES A DAY     levothyroxine 175 MCG tablet  Commonly known as:  SYNTHROID  Take 1 tablet (175 mcg total) by mouth daily.     lisinopril 10 MG tablet  Commonly known as:  PRINIVIL,ZESTRIL  Take 10 mg by mouth daily.     mirtazapine 30 MG tablet  Commonly known as:  REMERON  Take 30 mg by mouth at bedtime.     multivitamin tablet  Take 1 tablet by mouth daily.     NEXIUM PO  Take 20 mg by mouth every  morning.     nitroGLYCERIN 0.4 MG SL tablet  Commonly known as:  NITROSTAT  Place 1 tablet (0.4 mg total) under the tongue every 5 (five) minutes as needed for chest pain.     rOPINIRole 1 MG tablet  Commonly known as:  REQUIP  Take 1 tablet (1 mg total) by mouth daily as needed.     triamcinolone cream 0.1 %  Commonly known as:  KENALOG  Apply 1 application topically 2 (two) times daily.        Allergies:  Allergies  Allergen Reactions  . Amlodipine     Leg swelling    Family History: Family History  Problem Relation Age of Onset  . Colon cancer Sister   . Cancer Sister     colon  . Heart disease Father   . Esophageal cancer Neg Hx   . Rectal cancer Neg Hx   . Stomach cancer Neg Hx     Social History:  reports that she quit smoking about 7 years ago. Her smoking use included Cigarettes. She started smoking about 51 years ago. She has a 43 pack-year smoking history. She has never used smokeless tobacco. She reports that she drinks about 0.6 oz of alcohol per week. She reports that she does not use illicit drugs.  ROS: UROLOGY Frequent Urination?: Yes Hard to postpone urination?: Yes Burning/pain with urination?: No Get up at night to urinate?: No Leakage of urine?: Yes Urine stream starts and stops?: No Trouble starting stream?: No Do you have to strain to urinate?: No Blood in urine?: No Urinary tract infection?: No Sexually transmitted disease?: No Injury to kidneys or bladder?: No Painful intercourse?: No Weak stream?: No Currently pregnant?: No Vaginal bleeding?: No Last menstrual period?: n  Gastrointestinal Nausea?: No Vomiting?: No Indigestion/heartburn?: No Diarrhea?: No Constipation?: No  Constitutional Fever: No Night sweats?: No Weight loss?: No Fatigue?: No  Skin Skin rash/lesions?: Yes Itching?: No  Eyes Blurred vision?: No Double vision?: No  Ears/Nose/Throat Sore throat?: No  Sinus problems?:  No  Hematologic/Lymphatic Swollen glands?: No Easy bruising?: No  Cardiovascular Leg swelling?: No  Respiratory Cough?: No Shortness of breath?: No  Endocrine Excessive thirst?: No  Musculoskeletal Back pain?: No Joint pain?: No  Neurological Headaches?: No Dizziness?: No  Psychologic Depression?: No Anxiety?: No  Physical Exam: BP 177/89 mmHg  Pulse 64  Ht 5\' 7"  (1.702 m)  Wt 201 lb (91.173 kg)  BMI 31.47 kg/m2  Physical Exam  Abdominal: Soft. She exhibits no mass. There is no tenderness.  Genitourinary:  She has a normal introitus with atrophic mucosa.  The meatus is unremarkable.   There is minimal hypermobility and no significant cystocele or rectocele. The bladder is non-tender.  The uterus is atrophied but not prolapsed.  There are no adnexal masses.    Her PVR was 39ml by US  Lymphadenopathy:       Right: No inguinal adenopathy present.       Left: No inguinal adenopathy present.     Laboratory Data: Lab Results  Component Value Date   WBC 5.8 09/15/2015   HGB 13.2 09/15/2015   HCT 39.4 09/15/2015   MCV 90 09/15/2015   PLT 158 09/15/2015    Lab Results  Component Value Date   CREATININE 1.46* 10/02/2015    No results found for: PSA  No results found for: TESTOSTERONE  No results found for: HGBA1C  Urinalysis    Component Value Date/Time   COLORURINE YELLOW 07/24/2011 1347   APPEARANCEUR CLOUDY* 07/24/2011 1347   LABSPEC 1.026 07/24/2011 1347   PHURINE 5.5 07/24/2011 1347   GLUCOSEU NEGATIVE 07/24/2011 1347   HGBUR NEGATIVE 07/24/2011 1347   [place] 07/24/2011 1347   KETONESUR TRACE* 07/24/2011 1347   PROTEINUR 100* 07/24/2011 1347   UROBILINOGEN 0.2 07/24/2011 1347   NITRITE NEGATIVE 07/24/2011 [place] 07/24/2011 1347    Pertinent Imaging: [place] PVR is 48ml  Assessment & Plan:  She has mixed incontinence but minimal prolapse and no SUI on exam today.   She also has atrophic vaginitis.  I  discussed the options including medical therapy with an anticholinergic vs urodynamics to see if she might be a candidate for an outlet procedure such as Macroplastique or a sling.  She would prefer to avoid additional medications at this time so I will set her up for urodynamics in [facility] for further evaluation.     I briefly discussed PTNS, Interstim and Botox.   She is not a candidate for Myrbetriq because of her HTN.       Problem List Items Addressed This Visit    None    Visit Diagnoses    Urinary incontinence, mixed    -  Primary    Relevant Orders    Urinalysis, Complete       No Follow-up on file.  Malka So, MD  [facility] [address] [place], [place] [address] [phone]

## 2015-10-24 ENCOUNTER — Ambulatory Visit (INDEPENDENT_AMBULATORY_CARE_PROVIDER_SITE_OTHER): Payer: Medicare Other

## 2015-10-24 VITALS — BP 130/78 | HR 60 | Temp 97.4°F | Resp 14 | Ht 66.0 in | Wt 202.6 lb

## 2015-10-24 DIAGNOSIS — Z1231 Encounter for screening mammogram for malignant neoplasm of breast: Secondary | ICD-10-CM

## 2015-10-24 DIAGNOSIS — Z0000 Encounter for general adult medical examination without abnormal findings: Secondary | ICD-10-CM | POA: Diagnosis not present

## 2015-10-24 DIAGNOSIS — Z13820 Encounter for screening for osteoporosis: Secondary | ICD-10-CM | POA: Diagnosis not present

## 2015-10-24 NOTE — Patient Instructions (Addendum)
[name],  Thank you for taking time to come for your Medicare Wellness Visit.  I appreciate your ongoing commitment to your health goals. Please review the following plan we discussed and let me know if I can assist you in the future.  Bone Density as directed  Mammogram as directed  Bring a copy of advanced directives  Return for 4 week scheduled follow up   Follow up with TDAP and ZOSTAVAX as discussed   Bone Densitometry Bone densitometry is an imaging test that uses a special X-ray to measure the amount of calcium and other minerals in your bones (bone density). This test is also known as a bone mineral density test or dual-energy X-ray absorptiometry (DXA). The test can measure bone density at your hip and your spine. It is similar to having a regular X-ray. You may have this test to:  Diagnose a condition that causes weak or thin bones (osteoporosis).  Predict your risk of a broken bone (fracture).  Determine how well osteoporosis treatment is working. LET [facility] CARE PROVIDER KNOW ABOUT:  Any allergies you have.  All medicines you are taking, including vitamins, herbs, eye drops, creams, and over-the-counter medicines.  Previous problems you or members of your family have had with the use of anesthetics.  Any blood disorders you have.  Previous surgeries you have had.  Medical conditions you have.  Possibility of pregnancy.  Any other medical test you had within the previous 14 days that used contrast material. RISKS AND COMPLICATIONS Generally, this is a safe procedure. However, problems can occur and may include the following:  This test exposes you to a very small amount of radiation.  The risks of radiation exposure may be greater to unborn children. BEFORE THE PROCEDURE  Do not take any calcium supplements for 24 hours before having the test. You can otherwise eat and drink what you usually do.  Take off all metal jewelry, eyeglasses, dental  appliances, and any other metal objects. PROCEDURE  You may lie on an exam table. There will be an X-ray generator below you and an imaging device above you.  Other devices, such as boxes or braces, may be used to position your body properly for the scan.  You will need to lie still while the machine slowly scans your body.  The images will show up on a computer monitor. AFTER THE PROCEDURE You may need more testing at a later time.   This information is not intended to replace advice given to you by your health care provider. Make sure you discuss any questions you have with your health care provider.   Document Released: 12/17/2004 Document Revised: 12/16/2014 Document Reviewed: 05/05/2014 Elsevier Interactive Patient Education 2016 [place] A mammogram is an X-ray of the breasts that is done to check for changes that are not normal. This test can screen for and find any changes that may suggest breast cancer. This test can also help to find other changes and variations in the breast. BEFORE THE PROCEDURE  Have this test done about 1-2 weeks after your period. This is usually when your breasts are the least tender.  If you are visiting a new doctor or clinic, send any past mammogram images to your new doctor's office.  Wash your breasts and under your arms the day of the test.  Do not use deodorants, perfumes, lotions, or powders on the day of the test.  Take off any jewelry from your neck.  Wear clothes  that you can change into and out of easily. PROCEDURE  You will undress from the waist up. You will put on a gown.  You will stand in front of the X-ray machine.  Each breast will be placed between two plastic or glass plates. The plates will press down on your breast for a few seconds. Try to stay as relaxed as possible. This does not cause any harm to your breasts. Any discomfort you feel will be very brief.  X-rays will be taken from different angles of  each breast. The procedure may vary among doctors and hospitals. AFTER THE PROCEDURE  The mammogram will be looked at by a specialist (radiologist).  You may need to do certain parts of the test again. This depends on the quality of the images.  Ask when your test results will be ready. Make sure you get your test results.  You may go back to your normal activities.   This information is not intended to replace advice given to you by your health care provider. Make sure you discuss any questions you have with your health care provider.   Document Released: 02/17/2012 Document Revised: 08/16/2015 Document Reviewed: 02/03/2015 Elsevier Interactive Patient Education 2016 [employer] Inc[employer]  Fat and Cholesterol Restricted Diet High levels of fat and cholesterol in your blood may lead to various health problems, such as diseases of the heart, blood vessels, gallbladder, liver, and pancreas. Fats are concentrated sources of energy that come in various forms. Certain types of fat, including saturated fat, may be harmful in excess. Cholesterol is a substance needed by your body in small amounts. Your body makes all the cholesterol it needs. Excess cholesterol comes from the food you eat. When you have high levels of cholesterol and saturated fat in your blood, health problems can develop because the excess fat and cholesterol will gather along the walls of your blood vessels, causing them to narrow. Choosing the right foods will help you control your intake of fat and cholesterol. This will help keep the levels of these substances in your blood within normal limits and reduce your risk of disease. WHAT IS MY PLAN? Your health care provider recommends that you:  Get no more than __________ % of the total calories in your daily diet from fat.  Limit your intake of saturated fat to less than ______% of your total calories each day.  Limit the amount of cholesterol in your diet to less than _________mg  per day. WHAT TYPES OF FAT SHOULD I CHOOSE?  Choose healthy fats more often. Choose monounsaturated and polyunsaturated fats, such as olive and canola oil, flaxseeds, walnuts, almonds, and seeds.  Eat more omega-3 fats. Good choices include salmon, mackerel, sardines, tuna, flaxseed oil, and ground flaxseeds. Aim to eat fish at least two times a week.  Limit saturated fats. Saturated fats are primarily found in animal products, such as meats, butter, and cream. Plant sources of saturated fats include palm oil, palm kernel oil, and coconut oil.  Avoid foods with partially hydrogenated oils in them. These contain trans fats. Examples of foods that contain trans fats are stick margarine, some tub margarines, cookies, crackers, and other baked goods. WHAT GENERAL GUIDELINES DO I NEED TO FOLLOW? These guidelines for healthy eating will help you control your intake of fat and cholesterol:  Check food labels carefully to identify foods with trans fats or high amounts of saturated fat.  Fill one half of your plate with vegetables and green salads.  Fill one  fourth of your plate with whole grains. Look for the word "whole" as the first word in the ingredient list.  Fill one fourth of your plate with lean protein foods.  Limit fruit to two servings a day. Choose fruit instead of juice.  Eat more foods that contain soluble fiber. Examples of foods that contain this type of fiber are apples, broccoli, carrots, beans, peas, and barley. Aim to get 20-30 g of fiber per day.  Eat more home-cooked food and less restaurant, buffet, and fast food.  Limit or avoid alcohol.  Limit foods high in starch and sugar.  Limit fried foods.  Cook foods using methods other than frying. Baking, boiling, grilling, and broiling are all great options.  Lose weight if you are overweight. Losing just 5-10% of your initial body weight can help your overall health and prevent diseases such as diabetes and heart  disease. WHAT FOODS CAN I EAT? Grains Whole grains, such as whole wheat or whole grain breads, crackers, cereals, and pasta. Unsweetened oatmeal, bulgur, barley, quinoa, or brown rice. Corn or whole wheat flour tortillas. Vegetables Fresh or frozen vegetables (raw, steamed, roasted, or grilled). Green salads. Fruits All fresh, canned (in natural juice), or frozen fruits. Meat and Other Protein Products Ground beef (85% or leaner), grass-fed beef, or beef trimmed of fat. Skinless chicken or [place]. Ground chicken or [place]. Pork trimmed of fat. All fish and seafood. Eggs. Dried beans, peas, or lentils. Unsalted nuts or seeds. Unsalted canned or dry beans. Dairy Low-fat dairy products, such as skim or 1% milk, 2% or reduced-fat cheeses, low-fat ricotta or cottage cheese, or plain low-fat yogurt. Fats and Oils Tub margarines without trans fats. Light or reduced-fat mayonnaise and salad dressings. Avocado. Olive, canola, sesame, or safflower oils. Natural peanut or almond butter (choose ones without added sugar and oil). The items listed above may not be a complete list of recommended foods or beverages. Contact your dietitian for more options. WHAT FOODS ARE NOT RECOMMENDED? Grains White bread. White pasta. White rice. Cornbread. Bagels, pastries, and croissants. Crackers that contain trans fat. Vegetables White potatoes. Corn. Creamed or fried vegetables. Vegetables in a cheese sauce. Fruits Dried fruits. Canned fruit in light or heavy syrup. Fruit juice. Meat and Other Protein Products Fatty cuts of meat. Ribs, chicken wings, bacon, sausage, bologna, salami, chitterlings, fatback, hot dogs, bratwurst, and packaged luncheon meats. Liver and organ meats. Dairy Whole or 2% milk, cream, half-and-half, and cream cheese. Whole milk cheeses. Whole-fat or sweetened yogurt. Full-fat cheeses. Nondairy creamers and whipped toppings. Processed cheese, cheese spreads, or cheese curds. Sweets and  Desserts Corn syrup, sugars, honey, and molasses. Candy. Jam and jelly. Syrup. Sweetened cereals. Cookies, pies, cakes, donuts, muffins, and ice cream. Fats and Oils Butter, stick margarine, lard, shortening, ghee, or bacon fat. Coconut, palm kernel, or palm oils. Beverages Alcohol. Sweetened drinks (such as sodas, lemonade, and fruit drinks or punches). The items listed above may not be a complete list of foods and beverages to avoid. Contact your dietitian for more information.   This information is not intended to replace advice given to you by your health care provider. Make sure you discuss any questions you have with your health care provider.   Document Released: 11/25/2005 Document Revised: 12/16/2014 Document Reviewed: 02/23/2014 Elsevier Interactive Patient Education 2016 Elsevier Inc.  Fat and Cholesterol Restricted Diet Getting too much fat and cholesterol in your diet may cause health problems. Following this diet helps keep your fat and cholesterol at normal  levels. This can keep you from getting sick. WHAT TYPES OF FAT SHOULD I CHOOSE?  Choose monosaturated and polyunsaturated fats. These are found in foods such as olive oil, canola oil, flaxseeds, walnuts, almonds, and seeds.  Eat more omega-3 fats. Good choices include salmon, mackerel, sardines, tuna, flaxseed oil, and ground flaxseeds.  Limit saturated fats. These are in animal products such as meats, butter, and cream. They can also be in plant products such as palm oil, palm kernel oil, and coconut oil.   Avoid foods with partially hydrogenated oils in them. These contain trans fats. Examples of foods that have trans fats are stick margarine, some tub margarines, cookies, crackers, and other baked goods. WHAT GENERAL GUIDELINES DO I NEED TO FOLLOW?   Check food labels. Look for the words "trans fat" and "saturated fat."  When preparing a meal:  Fill half of your plate with vegetables and green salads.  Fill one  fourth of your plate with whole grains. Look for the word "whole" as the first word in the ingredient list.  Fill one fourth of your plate with lean protein foods.  Limit fruit to two servings a day. Choose fruit instead of juice.  Eat more foods with soluble fiber. Examples of foods with this type of fiber are apples, broccoli, carrots, beans, peas, and barley. Try to get 20-30 g (grams) of fiber per day.  Eat more home-cooked foods. Eat less at restaurants and buffets.  Limit or avoid alcohol.  Limit foods high in starch and sugar.  Limit fried foods.  Cook foods without frying them. Baking, boiling, grilling, and broiling are all great options.  Lose weight if you are overweight. Losing even a small amount of weight can help your overall health. It can also help prevent diseases such as diabetes and heart disease. WHAT FOODS CAN I EAT? Grains Whole grains, such as whole wheat or whole grain breads, crackers, cereals, and pasta. Unsweetened oatmeal, bulgur, barley, quinoa, or brown rice. Corn or whole wheat flour tortillas. Vegetables Fresh or frozen vegetables (raw, steamed, roasted, or grilled). Green salads. Fruits All fresh, canned (in natural juice), or frozen fruits. Meat and Other Protein Products Ground beef (85% or leaner), grass-fed beef, or beef trimmed of fat. Skinless chicken or [place]. Ground chicken or [place]. Pork trimmed of fat. All fish and seafood. Eggs. Dried beans, peas, or lentils. Unsalted nuts or seeds. Unsalted canned or dry beans. Dairy Low-fat dairy products, such as skim or 1% milk, 2% or reduced-fat cheeses, low-fat ricotta or cottage cheese, or plain low-fat yogurt. Fats and Oils Tub margarines without trans fats. Light or reduced-fat mayonnaise and salad dressings. Avocado. Olive, canola, sesame, or safflower oils. Natural peanut or almond butter (choose ones without added sugar and oil). The items listed above may not be a complete list of recommended  foods or beverages. Contact your dietitian for more options. WHAT FOODS ARE NOT RECOMMENDED? Grains White bread. White pasta. White rice. Cornbread. Bagels, pastries, and croissants. Crackers that contain trans fat. Vegetables White potatoes. Corn. Creamed or fried vegetables. Vegetables in a cheese sauce. Fruits Dried fruits. Canned fruit in light or heavy syrup. Fruit juice. Meat and Other Protein Products Fatty cuts of meat. Ribs, chicken wings, bacon, sausage, bologna, salami, chitterlings, fatback, hot dogs, bratwurst, and packaged luncheon meats. Liver and organ meats. Dairy Whole or 2% milk, cream, half-and-half, and cream cheese. Whole milk cheeses. Whole-fat or sweetened yogurt. Full-fat cheeses. Nondairy creamers and whipped toppings. Processed cheese, cheese spreads, or  cheese curds. Sweets and Desserts Corn syrup, sugars, honey, and molasses. Candy. Jam and jelly. Syrup. Sweetened cereals. Cookies, pies, cakes, donuts, muffins, and ice cream. Fats and Oils Butter, stick margarine, lard, shortening, ghee, or bacon fat. Coconut, palm kernel, or palm oils. Beverages Alcohol. Sweetened drinks (such as sodas, lemonade, and fruit drinks or punches). The items listed above may not be a complete list of foods and beverages to avoid. Contact your dietitian for more information.   This information is not intended to replace advice given to you by your health care provider. Make sure you discuss any questions you have with your health care provider.   Document Released: 05/26/2012 Document Revised: 12/16/2014 Document Reviewed: 02/24/2014 Elsevier Interactive Patient Education [employer].

## 2015-10-24 NOTE — Progress Notes (Signed)
Annual Wellness Visit as completed by Health Coach was reviewed in full.  

## 2015-10-24 NOTE — Progress Notes (Signed)
Subjective:   Cassidy Ortiz is a 74 y.o. female who presents for Medicare Annual (Subsequent) preventive examination.  Review of Systems:  No ROS.  Medicare Wellness Visit.  Cardiac Risk Factors include: advanced age (>69men, >21 women);hypertension     Objective:     Vitals: BP 130/78 mmHg  Pulse 60  Temp(Src) 97.4 F (36.3 C) (Oral)  Resp 14  Ht 5\' 6"  (1.676 m)  Wt 202 lb 9.6 oz (91.899 kg)  BMI 32.72 kg/m2  SpO2 94%  Tobacco History  Smoking status  . Former Smoker -- 1.00 packs/day for 43 years  . Types: Cigarettes  . Start date: 04/01/1964  . Quit date: 11/24/2007  Smokeless tobacco  . Never Used    Comment: quit smoking 7 years ago     Counseling given: Not Answered   Past Medical History  Diagnosis Date  . Vitamin D deficiency   . Mixed hyperlipidemia   . Anxiety state, unspecified   . Depressive disorder, not elsewhere classified   . Obstructive sleep apnea (adult) (pediatric)   . Hypertensive kidney disease, benign   . Cerebral aneurysm, nonruptured 2008    s/p coil and shunt, performed in [place]  . Duodenal ulcer   . Chronic kidney disease, stage III (moderate)   . Acquired cyst of kidney   . Abnormal weight gain   . Fatty liver   . Diverticulosis of colon (without mention of hemorrhage)   . Personal history of colonic polyps 10/22/2002    hyperplastic   . Hypertension   . Duodenal mass   . Baker's cyst of knee     Left, pt does not remember  . Edema   . SOB (shortness of breath)   . Malignant neoplasm of breast (female), unspecified site 2001    right, s/p lumpectomy and XRT  . 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 Unspecified hypothyroidism   . Grave's disease   . Hypothyroid   . Sleep apnea     off cpap  . Cerebral aneurysm rupture [facility])     stent   Past Surgical History  Procedure Laterality Date  . Breast lumpectomy Right     right  . Partial nephrectomy      right  . Shoulder surgery       left  . Cataract extraction      bilateral  . Coronary stent placement      brain  . Femoral artery repair  2011  . Csf shunt  08/17/2008  . Hemiarthroplasty shoulder fracture      left  . Vaginal delivery      3  . Upper gastrointestinal endoscopy  11-10-2014  . Colonoscopy      last 2012.+ TA polyps   Family History  Problem Relation Age of Onset  . Colon cancer Sister   . Cancer Sister     colon  . Heart disease Father   . Esophageal cancer Neg Hx   . Rectal cancer Neg Hx   . Stomach cancer Neg Hx    History  Sexual Activity  . Sexual Activity: Not Currently    Outpatient Encounter Prescriptions as of 10/24/2015  Medication Sig  . ALPRAZolam (XANAX) 0.25 MG tablet Take 1 tablet (0.25 mg total) by mouth 3 (three) times daily as needed for anxiety.  Marland Kitchen aspirin 81 MG tablet Take 81 mg by mouth daily. Takes 2 tablets daily  . atorvastatin (LIPITOR) 40 MG tablet TAKE  1 TABLET DAILY  . buPROPion (WELLBUTRIN SR) 100 MG 12 hr tablet Take 100 mg by mouth daily.  . carvedilol (COREG) 12.5 MG tablet Take 1 tablet (12.5 mg total) by mouth 2 (two) times daily with a meal.  . cloNIDine (CATAPRES) 0.1 MG tablet TAKE 1 TABLET 3 TIMES A DAY  . doxazosin (CARDURA) 8 MG tablet Take 1 tablet (8 mg total) by mouth daily. (Patient taking differently: Take 4 mg by mouth daily. )  . Esomeprazole Magnesium (NEXIUM PO) Take 20 mg by mouth every morning.   . hydrALAZINE (APRESOLINE) 100 MG tablet TAKE 1 TABLET 3 TIMES A DAY  . levothyroxine (SYNTHROID) 175 MCG tablet Take 1 tablet (175 mcg total) by mouth daily.  Marland Kitchen lisinopril (PRINIVIL,ZESTRIL) 10 MG tablet Take 10 mg by mouth daily.  . mirtazapine (REMERON) 30 MG tablet Take 30 mg by mouth at bedtime.  . Multiple Vitamin (MULTIVITAMIN) tablet Take 1 tablet by mouth daily.    . nitroGLYCERIN (NITROSTAT) 0.4 MG SL tablet Place 1 tablet (0.4 mg total) under the tongue every 5 (five) minutes as needed for chest pain.  Marland Kitchen rOPINIRole (REQUIP) 1 MG  tablet Take 1 tablet (1 mg total) by mouth daily as needed.  . triamcinolone cream (KENALOG) 0.1 % Apply 1 application topically 2 (two) times daily.  Marland Kitchen zolpidem (AMBIEN) 10 MG tablet Take 10 mg by mouth at bedtime as needed for sleep.   Facility-Administered Encounter Medications as of 10/24/2015  Medication  . 0.9 %  sodium chloride infusion    Activities of Daily Living In your present state of health, do you have any difficulty performing the following activities: 10/24/2015  Hearing? N  Vision? N  Difficulty concentrating or making decisions? N  Walking or climbing stairs? N  Dressing or bathing? N  Doing errands, shopping? N  Preparing Food and eating ? N  Using the Toilet? N  In the past six months, have you accidently leaked urine? Y  Do you have problems with loss of bowel control? N  Managing your Medications? N  Managing your Finances? N  Housekeeping or managing your Housekeeping? N    Patient Care Team: Jackolyn Confer, MD as PCP - General (Internal Medicine)    Assessment:    This is a routine wellness examination for [facility]. The goal of the wellness visit is to assist the patient how to close the gaps in care and create a preventative care plan for the patient.   Medications reviewed; taking as prescribed and without barriers.  Bone Density/Osteoporosis discussed.  DEXA SCAN referral placed.  MAMMOGRAM referral placed, per patient request.  ZOSTAVAX and TDAP postponed per patient request. Follow up with PCP.  Safety issues reviewed; smoke detectors in the home. Firearms locked in a secure area.  Wears seatbelts when driving or riding with others. No violence in the home.  The patient was oriented x 3; appropriate in dress and manner and no objective failures at ADL's or IADL's.   Patient concerns: Not at this time. Follow up with PCP as needed.   Exercise Activities and Dietary recommendations Current Exercise Habits:: Structured exercise class  (Swimmimg x5 days a week, 1 hour per day), Type of exercise: treadmill;calisthenics, Time (Minutes): 40, Frequency (Times/Week): 5, Weekly Exercise (Minutes/Week): 200, Intensity: Moderate  Goals    . Maintain     Make healthy food choices when dining out.  Education provided. Maintain exercising regiment already in place. Maintain healthy meal choices at home.  Fall Risk Fall Risk  10/24/2015 09/15/2015 04/20/2015 09/01/2014 07/13/2014  Falls in the past year? No No No No Yes  Number falls in past yr: - - - - 1  Injury with Fall? - - - - Yes  Risk Factor Category  - - - - High Fall Risk  Risk for fall due to : - - - - History of fall(s)   Depression Screen PHQ 2/9 Scores 10/24/2015 07/13/2014 10/30/2012  PHQ - 2 Score 0 4 0  PHQ- 9 Score - 4 -     Cognitive Testing MMSE - Mini Mental State Exam 10/24/2015  Orientation to time 5  Orientation to Place 5  Registration 3  Attention/ Calculation 5  Recall 3  Language- name 2 objects 2  Language- repeat 1  Language- follow 3 step command 3  Language- read & follow direction 1  Write a sentence 1  Copy design 1  Total score 30    Immunization History  Administered Date(s) Administered  . Influenza Split 08/23/2012  . Influenza,inj,Quad PF,36+ Mos 09/10/2013, 08/18/2014, 09/15/2015  . Pneumococcal Conjugate-13 07/13/2014  . Pneumococcal Polysaccharide-23 09/23/2011   Screening Tests Health Maintenance  Topic Date Due  . DEXA SCAN  02/12/2008  . TETANUS/TDAP  10/21/2016 (Originally 02/11/1960)  . ZOSTAVAX  10/23/2016 (Originally 02/12/2003)  . INFLUENZA VACCINE  07/09/2016  . MAMMOGRAM  09/29/2016  . COLONOSCOPY  11/19/2019      Plan:   End of life planning; Advance aging; Advanced directives discussed. Plans to complete HCPOA/LivingWill forms with husband and return with a copy.   Return in 2 weeks for scheduled follow up with PCP.  MAMMOGRAM as directed.  DEXA SCAN as directed.  Return in 1 year for annual  wellness visit with Health Coach.  During the course of the visit the patient was educated and counseled about the following appropriate screening and preventive services:   Vaccines to include Pneumoccal, Influenza, Hepatitis B, Td, Zostavax, HCV  Electrocardiogram  Cardiovascular Disease  Colorectal cancer screening  Bone density screening  Diabetes screening  Glaucoma screening  Mammography/PAP  Nutrition counseling   Patient Instructions (the written plan) was given to the patient.   Varney Biles, LPN  [phone]

## 2015-10-25 ENCOUNTER — Telehealth: Payer: Self-pay

## 2015-10-25 NOTE — Telephone Encounter (Signed)
Would you like for me to call the pt and make them aware or just put information in their chart?

## 2015-10-25 NOTE — Telephone Encounter (Signed)
-----   Message from Irine Seal, MD sent at 10/25/2015  8:38 AM EST ----- Regarding: RE: Results Available OK but has some proteinuria that is likely secondary to her chronic kidney disease.  ----- Message -----    From: Noralyn Pick    Sent: 10/24/2015   3:03 PM      To: Irine Seal, MD Subject: Results Available                              The following results are available for your review: Urinalysis

## 2015-10-26 NOTE — Telephone Encounter (Signed)
-----   Message from Irine Seal, MD sent at 10/26/2015  7:32 AM EST ----- You can just file her UA in the chart.   I didn't see a why to reply to the message you sent me about her UA.

## 2015-11-08 ENCOUNTER — Ambulatory Visit (INDEPENDENT_AMBULATORY_CARE_PROVIDER_SITE_OTHER): Payer: Medicare Other | Admitting: Internal Medicine

## 2015-11-08 ENCOUNTER — Encounter: Payer: Self-pay | Admitting: Internal Medicine

## 2015-11-08 VITALS — BP 136/81 | HR 68 | Temp 98.7°F | Ht 66.0 in | Wt 202.2 lb

## 2015-11-08 DIAGNOSIS — F332 Major depressive disorder, recurrent severe without psychotic features: Secondary | ICD-10-CM

## 2015-11-08 DIAGNOSIS — G4733 Obstructive sleep apnea (adult) (pediatric): Secondary | ICD-10-CM

## 2015-11-08 DIAGNOSIS — R197 Diarrhea, unspecified: Secondary | ICD-10-CM | POA: Diagnosis not present

## 2015-11-08 DIAGNOSIS — R32 Unspecified urinary incontinence: Secondary | ICD-10-CM | POA: Diagnosis not present

## 2015-11-08 DIAGNOSIS — I10 Essential (primary) hypertension: Secondary | ICD-10-CM | POA: Diagnosis not present

## 2015-11-08 DIAGNOSIS — Z1239 Encounter for other screening for malignant neoplasm of breast: Secondary | ICD-10-CM | POA: Diagnosis not present

## 2015-11-08 NOTE — Patient Instructions (Signed)
Follow up next spring.

## 2015-11-08 NOTE — Assessment & Plan Note (Signed)
Stopped using CPAP because of allergy to silicone mask. She understands risk of untreated OSA.

## 2015-11-08 NOTE — Progress Notes (Signed)
Pre visit review using our clinic review tool, if applicable. No additional management support is needed unless otherwise documented below in the visit note. 

## 2015-11-08 NOTE — Assessment & Plan Note (Signed)
Symptoms have resolved. Suspect viral gastroenteritis. Will monitor for any recurrent symptoms.

## 2015-11-08 NOTE — Assessment & Plan Note (Addendum)
Symptoms well controlled on current medications.

## 2015-11-08 NOTE — Assessment & Plan Note (Signed)
Reviewed notes from urology. Urodynamic testing scheduled. She has opted not to try medications to help with bladder continence. Follow up prn.

## 2015-11-08 NOTE — Progress Notes (Signed)
Subjective:    Patient ID: Cassidy Ortiz, female    DOB: 07/14/1941, 74 y.o.   MRN: [id number]  HPI  73YO female presents for follow up.  Last seen in 09/2015 with diarrhea and dermatitis.  Diarrhea has resolved.  Dermatitis improved. Stopped using CPAP mask. Started using a bed with raised head.  Seeing urology next week for urodynamic testing. Evaluating cause of urinary incontinence. Continues to have urinary leakage. Wearing a pad most days. Some chronic leakage and some urge incontinence.  Depression - Symptoms well controlled on current medications. She has been more active and has been exercising at the [facility] 5 days per week.    Wt Readings from Last 3 Encounters:  11/08/15 202 lb 4 oz (91.74 kg)  10/24/15 202 lb 9.6 oz (91.899 kg)  10/23/15 201 lb (91.173 kg)   BP Readings from Last 3 Encounters:  11/08/15 136/81  10/24/15 130/78  10/23/15 177/89    Past Medical History  Diagnosis Date  . Vitamin D deficiency   . Mixed hyperlipidemia   . Anxiety state, unspecified   . Depressive disorder, not elsewhere classified   . Obstructive sleep apnea (adult) (pediatric)   . Hypertensive kidney disease, benign   . Cerebral aneurysm, nonruptured 2008    s/p coil and shunt, performed in [place]  . Duodenal ulcer   . Chronic kidney disease, stage III (moderate)   . Acquired cyst of kidney   . Abnormal weight gain   . Fatty liver   . Diverticulosis of colon (without mention of hemorrhage)   . Personal history of colonic polyps 10/22/2002    hyperplastic   . Hypertension   . Duodenal mass   . Baker's cyst of knee     Left, pt does not remember  . Edema   . SOB (shortness of breath)   . Malignant neoplasm of breast (female), unspecified site 2001    right, s/p lumpectomy and XRT  . 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 Unspecified hypothyroidism   . Grave's disease   . Hypothyroid   . Sleep apnea     off cpap  . Cerebral  aneurysm rupture (HCC)     stent   Family History  Problem Relation Age of Onset  . Colon cancer Sister   . Cancer Sister     colon  . Heart disease Father   . Esophageal cancer Neg Hx   . Rectal cancer Neg Hx   . Stomach cancer Neg Hx    Past Surgical History  Procedure Laterality Date  . Breast lumpectomy Right     right  . Partial nephrectomy      right  . Shoulder surgery      left  . Cataract extraction      bilateral  . Coronary stent placement      brain  . Femoral artery repair  2011  . Csf shunt  08/17/2008  . Hemiarthroplasty shoulder fracture      left  . Vaginal delivery      3  . Upper gastrointestinal endoscopy  11-10-2014  . Colonoscopy      last 2012.+ TA polyps   Social History   Social History  . Marital Status: Married    Spouse Name: N/A  . Number of Children: 3  . Years of Education: N/A   Occupational History  . retired    Social History Main Topics  . Smoking status: Former Smoker --  1.00 packs/day for 43 years    Types: Cigarettes    Start date: 04/01/1964    Quit date: 11/24/2007  . Smokeless tobacco: Never Used     Comment: quit smoking 7 years ago  . Alcohol Use: 0.6 oz/week    1 Glasses of wine per week     Comment: occasssionally  . Drug Use: No  . Sexual Activity: Not Currently   Other Topics Concern  . None   Social History Narrative   Lives in [place] with husband. From [place]. Children live [place] and [place].   Pets - 1 cat in home.      Work - retired Theme park manager, Educational psychologist   Diet - regular, limited calories          Review of Systems  Constitutional: Negative for fever, chills, appetite change, fatigue and unexpected weight change.  Eyes: Negative for visual disturbance.  Respiratory: Negative for shortness of breath.   Cardiovascular: Negative for chest pain and leg swelling.  Gastrointestinal: Negative for nausea, vomiting, abdominal pain, diarrhea and constipation.  Musculoskeletal: Negative for myalgias and  arthralgias.  Skin: Negative for color change and rash.  Neurological: Negative for weakness.  Hematological: Negative for adenopathy. Does not bruise/bleed easily.  Psychiatric/Behavioral: Negative for sleep disturbance and dysphoric mood. The patient is not nervous/anxious.        Objective:    BP 136/81 mmHg  Pulse 68  Temp(Src) 98.7 F (37.1 C) (Oral)  Ht 5\' 6"  (1.676 m)  Wt 202 lb 4 oz (91.74 kg)  BMI 32.66 kg/m2  SpO2 94% Physical Exam  Constitutional: She is oriented to person, place, and time. She appears well-developed and well-nourished. No distress.  HENT:  Head: Normocephalic and atraumatic.  Right Ear: External ear normal.  Left Ear: External ear normal.  Nose: Nose normal.  Mouth/Throat: Oropharynx is clear and moist. No oropharyngeal exudate.  Eyes: Conjunctivae are normal. Pupils are equal, round, and reactive to light. Right eye exhibits no discharge. Left eye exhibits no discharge. No scleral icterus.  Neck: Normal range of motion. Neck supple. No tracheal deviation present. No thyromegaly present.  Cardiovascular: Normal rate, regular rhythm and intact distal pulses.  Exam reveals no gallop and no friction rub.   Murmur heard. Pulmonary/Chest: Effort normal and breath sounds normal. No respiratory distress. She has no wheezes. She has no rales. She exhibits no tenderness.  Musculoskeletal: Normal range of motion. She exhibits no edema or tenderness.  Lymphadenopathy:    She has no cervical adenopathy.  Neurological: She is alert and oriented to person, place, and time. No cranial nerve deficit. She exhibits normal muscle tone. Coordination normal.  Skin: Skin is warm and dry. No rash noted. She is not diaphoretic. No erythema. No pallor.  Psychiatric: She has a normal mood and affect. Her behavior is normal. Judgment and thought content normal.          Assessment & Plan:   Problem List Items Addressed This Visit      Unprioritized   Absence of  bladder continence    Reviewed notes from urology. Urodynamic testing scheduled. She has opted not to try medications to help with bladder continence. Follow up prn.      Diarrhea    Symptoms have resolved. Suspect viral gastroenteritis. Will monitor for any recurrent symptoms.      Hypertension    BP Readings from Last 3 Encounters:  11/08/15 136/81  10/24/15 130/78  10/23/15 177/89   BP well controlled. Recent renal function  stable. Continue current medications.      Major depressive disorder, recurrent, severe without psychotic features ([place])    Symptoms well controlled on current medications.      OSA (obstructive sleep apnea)    Stopped using CPAP because of allergy to silicone mask. She understands risk of untreated OSA.       Other Visit Diagnoses    Screening for breast cancer    -  Primary    Relevant Orders    MM Digital Screening        Return in about 6 months (around 05/07/2016) for Recheck.

## 2015-11-08 NOTE — Assessment & Plan Note (Signed)
BP Readings from Last 3 Encounters:  11/08/15 136/81  10/24/15 130/78  10/23/15 177/89   BP well controlled. Recent renal function stable. Continue current medications.

## 2015-11-09 ENCOUNTER — Telehealth: Payer: Self-pay | Admitting: Internal Medicine

## 2015-11-09 DIAGNOSIS — Z1283 Encounter for screening for malignant neoplasm of skin: Secondary | ICD-10-CM

## 2015-11-09 NOTE — Telephone Encounter (Signed)
Dr. Gilford Rile can you please put in referral in for dermatology.

## 2015-11-09 NOTE — Telephone Encounter (Signed)
Pt husband called about wanting a referral to the dermatologist they forgot to mention it yesterday. Pt prefers a doctor in [place]. Thank You!

## 2015-11-23 DIAGNOSIS — N3946 Mixed incontinence: Secondary | ICD-10-CM | POA: Diagnosis not present

## 2015-11-23 DIAGNOSIS — N3642 Intrinsic sphincter deficiency (ISD): Secondary | ICD-10-CM | POA: Diagnosis not present

## 2015-11-23 DIAGNOSIS — N3281 Overactive bladder: Secondary | ICD-10-CM | POA: Diagnosis not present

## 2015-11-23 DIAGNOSIS — R3915 Urgency of urination: Secondary | ICD-10-CM | POA: Diagnosis not present

## 2015-11-28 ENCOUNTER — Encounter: Payer: Self-pay | Admitting: Urology

## 2015-11-28 ENCOUNTER — Ambulatory Visit (INDEPENDENT_AMBULATORY_CARE_PROVIDER_SITE_OTHER): Payer: Medicare Other | Admitting: Urology

## 2015-11-28 VITALS — BP 187/76 | HR 63 | Ht 70.0 in | Wt 198.8 lb

## 2015-11-28 DIAGNOSIS — N3946 Mixed incontinence: Secondary | ICD-10-CM | POA: Insufficient documentation

## 2015-11-28 NOTE — Progress Notes (Signed)
2:20 PM   Cassidy Ortiz 01/14/1941 [id number]  Referring provider: Jackolyn Confer, MD [id number] [place], [place] [address]  Chief Complaint  Patient presents with  . Results    HPI:  1 - Stress Predominant Mixed Incontinence - G3P3 all SVD with incrasing bother from mixed incontinence, now managing with 6 soaked thick pads per day. Pelvic 2016 w/o significant prolapse. Urodynamics 2016 with stress-predominant leakage with VLPP 44, no DSD, minimal instability. PVR <73mL. No prior pelvic surgery. Has failed kegel's / formal PT.  2 - History of Right Renal Cell Carcinoma - s/p right robotic partial nephrectomy around 2009 per report for unknown grade / stage lesion. CT 2012 in [place] without recurrence. Gets f/u throught cancer center for this and h/o breast cancer.  Today "Cassidy Ortiz" is seen in f/u above. She had interval urodynamics which reveald stress-predominant leakage with ISD and some stress induced urge.    PMH: Past Medical History  Diagnosis Date  . Vitamin D deficiency   . Mixed hyperlipidemia   . Anxiety state, unspecified   . Depressive disorder, not elsewhere classified   . Obstructive sleep apnea (adult) (pediatric)   . Hypertensive kidney disease, benign   . Cerebral aneurysm, nonruptured 2008    s/p coil and shunt, performed in [place]  . Duodenal ulcer   . Chronic kidney disease, stage III (moderate)   . Acquired cyst of kidney   . Abnormal weight gain   . Fatty liver   . Diverticulosis of colon (without mention of hemorrhage)   . Personal history of colonic polyps 10/22/2002    hyperplastic   . Hypertension   . Duodenal mass   . Baker's cyst of knee     Left, pt does not remember  . Edema   . SOB (shortness of breath)   . Malignant neoplasm of breast (female), unspecified site 2001    right, s/p lumpectomy and XRT  . 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 Unspecified hypothyroidism   .  Grave's disease   . Hypothyroid   . Sleep apnea     off cpap  . Cerebral aneurysm rupture [facility])     stent    Surgical History: Past Surgical History  Procedure Laterality Date  . Breast lumpectomy Right     right  . Partial nephrectomy      right  . Shoulder surgery      left  . Cataract extraction      bilateral  . Coronary stent placement      brain  . Femoral artery repair  2011  . Csf shunt  08/17/2008  . Hemiarthroplasty shoulder fracture      left  . Vaginal delivery      3  . Upper gastrointestinal endoscopy  11-10-2014  . Colonoscopy      last 2012.+ TA polyps    Home Medications:    Medication List       This list is accurate as of: 11/28/15  2:20 PM.  Always use your most recent med list.               ALPRAZolam 0.25 MG tablet  Commonly known as:  XANAX  Take 1 tablet (0.25 mg total) by mouth 3 (three) times daily as needed for anxiety.     AMBIEN 10 MG tablet  Generic drug:  zolpidem  Take 10 mg by mouth at bedtime as needed for sleep.  aspirin 81 MG tablet  Take 81 mg by mouth daily. Takes 2 tablets daily     atorvastatin 40 MG tablet  Commonly known as:  LIPITOR  TAKE 1 TABLET DAILY     buPROPion 100 MG 12 hr tablet  Commonly known as:  WELLBUTRIN SR  Take 100 mg by mouth daily.     carvedilol 12.5 MG tablet  Commonly known as:  COREG  Take 1 tablet (12.5 mg total) by mouth 2 (two) times daily with a meal.     cloNIDine 0.1 MG tablet  Commonly known as:  CATAPRES  TAKE 1 TABLET 3 TIMES A DAY     doxazosin 8 MG tablet  Commonly known as:  CARDURA  Take 1 tablet (8 mg total) by mouth daily.     hydrALAZINE 100 MG tablet  Commonly known as:  APRESOLINE  TAKE 1 TABLET 3 TIMES A DAY     levothyroxine 175 MCG tablet  Commonly known as:  SYNTHROID  Take 1 tablet (175 mcg total) by mouth daily.     lisinopril 10 MG tablet  Commonly known as:  PRINIVIL,ZESTRIL  Take 10 mg by mouth daily.     mirtazapine 30 MG tablet    Commonly known as:  REMERON  Take 30 mg by mouth at bedtime.     multivitamin tablet  Take 1 tablet by mouth daily.     NEXIUM PO  Take 20 mg by mouth every morning.     nitroGLYCERIN 0.4 MG SL tablet  Commonly known as:  NITROSTAT  Place 1 tablet (0.4 mg total) under the tongue every 5 (five) minutes as needed for chest pain.     rOPINIRole 1 MG tablet  Commonly known as:  REQUIP  Take 1 tablet (1 mg total) by mouth daily as needed.     triamcinolone cream 0.1 %  Commonly known as:  KENALOG  Apply 1 application topically 2 (two) times daily.        Allergies:  Allergies  Allergen Reactions  . Amlodipine     Leg swelling    Family History: Family History  Problem Relation Age of Onset  . Colon cancer Sister   . Cancer Sister     colon  . Heart disease Father   . Esophageal cancer Neg Hx   . Rectal cancer Neg Hx   . Stomach cancer Neg Hx     Social History:  reports that she quit smoking about 8 years ago. Her smoking use included Cigarettes. She started smoking about 51 years ago. She has a 43 pack-year smoking history. She has never used smokeless tobacco. She reports that she drinks about 0.6 oz of alcohol per week. She reports that she does not use illicit drugs.  ROS: UROLOGY Frequent Urination?: No Hard to postpone urination?: Yes Burning/pain with urination?: No Get up at night to urinate?: No Leakage of urine?: Yes Urine stream starts and stops?: No Trouble starting stream?: No Do you have to strain to urinate?: No Blood in urine?: No Urinary tract infection?: No Sexually transmitted disease?: No Injury to kidneys or bladder?: No Painful intercourse?: No Weak stream?: No Currently pregnant?: No Vaginal bleeding?: No Last menstrual period?: n  Gastrointestinal Nausea?: No Vomiting?: No Indigestion/heartburn?: No Diarrhea?: No Constipation?: No  Constitutional Fever: No Night sweats?: No Weight loss?: No Fatigue?: Yes  Skin Skin  rash/lesions?: No Itching?: No  Eyes Blurred vision?: No Double vision?: No  Ears/Nose/Throat Sore throat?: No Sinus problems?: No  Hematologic/Lymphatic Swollen glands?: No Easy bruising?: No  Cardiovascular Leg swelling?: No Chest pain?: No  Respiratory Cough?: No Shortness of breath?: No  Endocrine Excessive thirst?: No  Musculoskeletal Back pain?: No Joint pain?: No  Neurological Headaches?: No Dizziness?: No  Psychologic Depression?: No Anxiety?: No  Physical Exam: BP 187/76 mmHg  Pulse 63  Ht 5\' 10"  (1.778 m)  Wt 198 lb 12.8 oz (90.175 kg)  BMI 28.52 kg/m2  Physical Exam  Abdominal: Soft. She exhibits no mass. There is no tenderness.  Genitourinary:  She has a normal introitus with atrophic mucosa.  The meatus is unremarkable.   There is minimal hypermobility and no significant cystocele or rectocele. The bladder is non-tender.  The uterus is atrophied but not prolapsed.  There are no adnexal masses.    Her PVR was 3ml by US  Lymphadenopathy:       Right: No inguinal adenopathy present.       Left: No inguinal adenopathy present.     Laboratory Data: Lab Results  Component Value Date   WBC 5.8 09/15/2015   HGB 13.2 09/15/2015   HCT 39.4 09/15/2015   MCV 90 09/15/2015   PLT 158 09/15/2015    Lab Results  Component Value Date   CREATININE 1.46* 10/02/2015    No results found for: PSA  No results found for: TESTOSTERONE  No results found for: HGBA1C  Urinalysis    Component Value Date/Time   COLORURINE YELLOW 07/24/2011 1347   APPEARANCEUR CLOUDY* 07/24/2011 1347   LABSPEC 1.026 07/24/2011 1347   PHURINE 5.5 07/24/2011 1347   GLUCOSEU Negative 10/23/2015 1406   HGBUR NEGATIVE 07/24/2011 1347   BILIRUBINUR Negative 10/23/2015 1406   [place] 07/24/2011 1347   KETONESUR TRACE* 07/24/2011 1347   PROTEINUR 100* 07/24/2011 1347   UROBILINOGEN 0.2 07/24/2011 1347   NITRITE Negative 10/23/2015 1406   NITRITE NEGATIVE  07/24/2011 1347   LEUKOCYTESUR Negative 10/23/2015 1406   LEUKOCYTESUR NEGATIVE 07/24/2011 1347    Pertinent Imaging: As per HPI  Assessment & Plan:    1 - Stress Predominant Mixed Incontinence - given volume of leakage and bother, rec referral for consideration of possible mid-urethral sling. Will refer MacDiarmid in [place] office for opinion. General peri-op course discussed today.   2 - History of Right Renal Cell Carcinoma - no recurrence by imaging over 3 years from definitive therapy. This fulfuills surveillance for stage 1 disease which she likely had per history. Rec continued f/u through cancer center .     Problem List Items Addressed This Visit    Mixed stress and urge urinary incontinence - Primary      Return if symptoms worsen or fail to improve.  Alexis Frock, [facility] [address] [place], [place] [address] [phone]

## 2015-11-29 ENCOUNTER — Ambulatory Visit (INDEPENDENT_AMBULATORY_CARE_PROVIDER_SITE_OTHER): Payer: Medicare Other | Admitting: Family Medicine

## 2015-11-29 ENCOUNTER — Encounter: Payer: Self-pay | Admitting: Family Medicine

## 2015-11-29 VITALS — BP 114/82 | HR 52 | Temp 98.1°F | Ht 70.0 in | Wt 191.8 lb

## 2015-11-29 DIAGNOSIS — I10 Essential (primary) hypertension: Secondary | ICD-10-CM | POA: Diagnosis not present

## 2015-11-29 NOTE — Assessment & Plan Note (Signed)
Recently elevated BP. Unclear reason for this as her BP is well controlled today. Advised continued compliance with Lisinopril, Hydralazine, Coreg, Clonidine.

## 2015-11-29 NOTE — Progress Notes (Signed)
Pre visit review using our clinic review tool, if applicable. No additional management support is needed unless otherwise documented below in the visit note. 

## 2015-11-29 NOTE — Progress Notes (Signed)
Subjective:  Patient ID: Cassidy Ortiz, female    DOB: [birth year]  Age: 74 y.o. MRN: [id number]  CC: Elevated BP  HPI:  74 year old female with a collocated past medical history including congestive heart failure, hypertension, & cerebral aneurysm presents for evaluation following recent elevated blood pressure.  Patient was seen by urology yesterday and her blood pressure was elevated at 187/76. She was instructed to follow-up with her primary care office regarding her labile blood pressure.  Patient states that she's been feeling well. No reports of chest pain, shortness breath, vision changes. He endorses compliance with her home medications - clonidine, hydralazine, Coreg, lisinopril. She is concerned about her recent blood pressure elevation as she has a known cerebral aneurysm.  Social Hx   Social History   Social History  . Marital Status: Married    Spouse Name: N/A  . Number of Children: 3  . Years of Education: N/A   Occupational History  . retired    Social History Main Topics  . Smoking status: Former Smoker -- 1.00 packs/day for 43 years    Types: Cigarettes    Start date: 04/01/1964    Quit date: 11/24/2007  . Smokeless tobacco: Never Used     Comment: quit smoking 7 years ago  . Alcohol Use: 0.6 oz/week    1 Glasses of wine per week     Comment: occasssionally  . Drug Use: No  . Sexual Activity: Not Currently   Other Topics Concern  . None   Social History Narrative   Lives in [place] with husband. From [place]. Children live [place] and [place].   Pets - 1 cat in home.      Work - retired Theme park manager, Educational psychologist   Diet - regular, limited calories         Review of Systems  Eyes: Negative for visual disturbance.  Respiratory: Negative for shortness of breath.   Cardiovascular: Negative for chest pain.   Objective:  BP 114/82 mmHg  Pulse 52  Temp(Src) 98.1 F (36.7 C) (Oral)  Ht 5\' 10"  (1.778 m)  Wt 191 lb 12 oz (86.977 kg)  BMI 27.51 kg/m2  SpO2  92%  BP/Weight 11/29/2015 11/28/2015 99991111  Systolic BP [id number]  Diastolic BP 82 76 81  Wt. (Lbs) 191.75 198.8 202.25  BMI 27.51 28.52 32.66   Physical Exam  Constitutional: She appears well-developed. No distress.  Cardiovascular: Normal rate and regular rhythm.   Murmur heard.  Systolic murmur is present with a grade of 2/6  Pulmonary/Chest: Effort normal and breath sounds normal. No respiratory distress. She has no wheezes. She has no rales.  Neurological: She is alert.  Psychiatric:  Depressed mood and flat affect.  Vitals reviewed.  Lab Results  Component Value Date   WBC 5.8 09/15/2015   HGB 13.2 09/15/2015   HCT 39.4 09/15/2015   PLT 158 09/15/2015   GLUCOSE 125* 10/02/2015   CHOL 150 07/13/2014   TRIG 114.0 07/13/2014   HDL 58.20 07/13/2014   LDLCALC 69 07/13/2014   ALT 17 10/02/2015   AST 22 10/02/2015   NA 144 10/02/2015   K 4.2 10/02/2015   CL 107 10/02/2015   CREATININE 1.46* 10/02/2015   BUN 36* 10/02/2015   CO2 28 10/02/2015   TSH 0.74 05/11/2015   INR 0.94 07/24/2011    Assessment & Plan:   Problem List Items Addressed This Visit    Hypertension - Primary    Recently elevated BP. Unclear reason for this as  her BP is well controlled today. Advised continued compliance with Lisinopril, Hydralazine, Coreg, Clonidine.          Follow-up: PRN  [place]

## 2015-12-12 DIAGNOSIS — G4700 Insomnia, unspecified: Secondary | ICD-10-CM | POA: Diagnosis not present

## 2015-12-12 DIAGNOSIS — F332 Major depressive disorder, recurrent severe without psychotic features: Secondary | ICD-10-CM | POA: Diagnosis not present

## 2015-12-15 ENCOUNTER — Ambulatory Visit (INDEPENDENT_AMBULATORY_CARE_PROVIDER_SITE_OTHER): Payer: Medicare Other | Admitting: Cardiovascular Disease

## 2015-12-15 ENCOUNTER — Encounter: Payer: Self-pay | Admitting: Cardiovascular Disease

## 2015-12-15 VITALS — BP 110/62 | HR 53 | Ht 67.0 in | Wt 197.2 lb

## 2015-12-15 DIAGNOSIS — I10 Essential (primary) hypertension: Secondary | ICD-10-CM

## 2015-12-15 DIAGNOSIS — I5031 Acute diastolic (congestive) heart failure: Secondary | ICD-10-CM

## 2015-12-15 DIAGNOSIS — R011 Cardiac murmur, unspecified: Secondary | ICD-10-CM

## 2015-12-15 DIAGNOSIS — G4733 Obstructive sleep apnea (adult) (pediatric): Secondary | ICD-10-CM

## 2015-12-15 DIAGNOSIS — E669 Obesity, unspecified: Secondary | ICD-10-CM

## 2015-12-15 DIAGNOSIS — Q248 Other specified congenital malformations of heart: Secondary | ICD-10-CM

## 2015-12-15 NOTE — Assessment & Plan Note (Signed)
Outflow tract gradient appreciated on echocardiogram 2013 Consistent with murmur on exam No further workup at this time

## 2015-12-15 NOTE — Assessment & Plan Note (Signed)
Compliant with her CPAP

## 2015-12-15 NOTE — Progress Notes (Signed)
Patient ID: Cassidy Ortiz, female    DOB: [birth year], 75 y.o.   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 She initially presented with lower extremity edema.  Previous  elevated creatinine of 1.5. Edema improved by holding diltiazem She presents today for follow-up of her blood pressure  In follow-up today, blood pressure is well controlled [id number] up to [id number] systolic, checked by myself She does report having elevated blood pressure on recent visit to urology. She was worried about having a procedure and feels this caused her blood pressure to go high. She does not check her blood pressure very much at home. Otherwise she feels well, working out with water aerobics, goes to the gym every day, down at least 15 pounds from her prior clinic visit Sleeping better, good mood  EKG on today's visit shows normal sinus rhythm with rate 53 bpm, LVH, no change from prior EKG  Past medical history went to the emergency room 04/03/2015 for severe hypertension Systolic pressures were greater than 200.  compliance with her medication She was not anxious, was not in pain, denied any stressors that could have contributed to her high blood pressure  they gave her hydralazine IV, gave her her regular afternoon medications   creatinine 1.49, BUN 31, hematocrit 40, EKG with normal sinus rhythm, rate 56 bpm, CT scan of the head with no acute findings   EGD on December 3 and had severe hypertension prior to the procedure. EGD showed small  region of Barrett's esophagus After she got home, blood pressure seem to improve down to her baseline in the 130 range Went in for colonoscopy 11/18/2014 and again had severe hypertension Again after the procedure, went home and blood pressure significantly improved  Blood work showing total cholesterol 150, LDL 69  Echocardiogram showed severe LVH, mild outflow tract gradient. No mention of elevated right ventricular systolic pressures. Previous  Problems with anemia before requiring IM iron, now resolved  Previous EGD showed  ulcer, H. Pylori negative.  Earlier in 2013, we discontinued the diltiazem and Her edema resolved.  She reports that in followup today, she is depressed. Recently started on Remeron for sleep over the past several months. Uncertain if this is helping but sleep is okay. She's not doing any exercise. Very sedentary. Feels her blood pressure is doing well but is not checking any numbers. Weight continues to trend upwards  previous assessment/evaluation at [facility] for history of aneurysms and shunt (leak?) showed that shunt is not working but it is not a problem. No recent carotid ultrasound   Allergies  Allergen Reactions  . Amlodipine     Leg swelling    Outpatient Encounter Prescriptions as of 12/15/2015  Medication Sig  . ALPRAZolam (XANAX) 0.25 MG tablet Take 1 tablet (0.25 mg total) by mouth 3 (three) times daily as needed for anxiety.  Marland Kitchen aspirin 81 MG tablet Take 81 mg by mouth daily. Takes 2 tablets daily  . atorvastatin (LIPITOR) 40 MG tablet TAKE 1 TABLET DAILY  . buPROPion (WELLBUTRIN SR) 100 MG 12  hr tablet Take 100 mg by mouth daily.  . carvedilol (COREG) 12.5 MG tablet Take 1 tablet (12.5 mg total) by mouth 2 (two) times daily with a meal.  . cloNIDine (CATAPRES) 0.1 MG tablet TAKE 1 TABLET 3 TIMES A DAY  . doxazosin (CARDURA) 8 MG tablet Take 1 tablet (8 mg total) by mouth daily. (Patient taking differently: Take 4 mg by mouth daily. )  . Esomeprazole  Magnesium (NEXIUM PO) Take 20 mg by mouth every morning.   . hydrALAZINE (APRESOLINE) 100 MG tablet TAKE 1 TABLET 3 TIMES A DAY  . levothyroxine (SYNTHROID) 175 MCG tablet Take 1 tablet (175 mcg total) by mouth daily.  Marland Kitchen lisinopril (PRINIVIL,ZESTRIL) 10 MG tablet Take 10 mg by mouth daily.  . mirtazapine (REMERON) 30 MG tablet Take 30 mg by mouth at bedtime.  . Multiple Vitamin (MULTIVITAMIN) tablet Take 1 tablet by mouth daily.    . nitroGLYCERIN (NITROSTAT) 0.4 MG SL tablet Place 1 tablet (0.4 mg total) under the tongue every 5 (five) minutes as needed for chest pain.  Marland Kitchen rOPINIRole (REQUIP) 1 MG tablet Take 1 tablet (1 mg total) by mouth daily as needed.  . triamcinolone cream (KENALOG) 0.1 % Apply 1 application topically 2 (two) times daily.  Marland Kitchen zolpidem (AMBIEN) 10 MG tablet Take 10 mg by mouth at bedtime as needed for sleep.   Facility-Administered Encounter Medications as of 12/15/2015  Medication  . 0.9 %  sodium chloride infusion    Past Medical History  Diagnosis Date  . Vitamin D deficiency   . Mixed hyperlipidemia   . Anxiety state, unspecified   . Depressive disorder, not elsewhere classified   . Obstructive sleep apnea (adult) (pediatric)   . Hypertensive kidney disease, benign   . Cerebral aneurysm, nonruptured 2008    s/p coil and shunt, performed in [place]  . Duodenal ulcer   . Chronic kidney disease, stage III (moderate)   . Acquired cyst of kidney   . Abnormal weight gain   . Fatty liver   . Diverticulosis of colon (without mention of hemorrhage)   . Personal history of colonic polyps 10/22/2002    hyperplastic   . Hypertension   . Duodenal mass   . Baker's cyst of knee     Left, pt does not remember  . Edema   . SOB (shortness of breath)   . Malignant neoplasm of breast (female), unspecified site 2001    right, s/p lumpectomy and XRT  . 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 Unspecified hypothyroidism   . Grave's disease    . Hypothyroid   . Sleep apnea     off cpap  . Cerebral aneurysm rupture [facility])     stent    Past Surgical History  Procedure Laterality Date  . Breast lumpectomy Right     right  . Partial nephrectomy      right  . Shoulder surgery      left  . Cataract extraction      bilateral  . Coronary stent placement      brain  . Femoral artery repair  2011  . Csf shunt  08/17/2008  . Hemiarthroplasty shoulder fracture      left  . Vaginal delivery      3  . Upper gastrointestinal endoscopy  11-10-2014  . Colonoscopy      last 2012.+ TA polyps    Social History  reports that she quit smoking  about 8 years ago. Her smoking use included Cigarettes. She started smoking about 51 years ago. She has a 43 pack-year smoking history. She has never used smokeless tobacco. She reports that she drinks about 0.6 oz of alcohol per week. She reports that she does not use illicit drugs.  Family History family history includes Cancer in her sister; Colon cancer in her sister; Heart disease in her father. There is no history of Esophageal cancer, Rectal cancer, or Stomach cancer.   Review of Systems  Constitutional: Negative.   Respiratory: Negative.   Cardiovascular: Negative.           Gastrointestinal: Negative.   Endocrine: Negative.   Musculoskeletal: Negative.   Skin: Negative.   Neurological: Negative.   Hematological: Negative.   Psychiatric/Behavioral: Negative.   All other systems reviewed and are negative.  BP 110/62 mmHg  Pulse 53  Ht 5\' 7"  (1.702 m)  Wt 197 lb 4 oz (89.472 kg)  BMI 30.89 kg/m2  Physical Exam  Constitutional: She is oriented to person, place, and time. She appears well-developed and well-nourished.  HENT:  Head: Normocephalic.  Nose: Nose normal.  Mouth/Throat: Oropharynx is clear and moist.  Eyes: Conjunctivae are normal. Pupils are equal, round, and reactive to light.  Neck: Normal range of motion. Neck supple. No JVD present.  Cardiovascular:  Normal rate, regular rhythm, S1 normal, S2 normal and intact distal pulses.  Exam reveals no gallop and no friction rub.   Murmur heard.  Systolic murmur is present with a grade of 2/6  Pulmonary/Chest: Effort normal and breath sounds normal. No respiratory distress. She has no wheezes. She has no rales. She exhibits no tenderness.  Abdominal: Soft. Bowel sounds are normal. She exhibits no distension. There is no tenderness.  Musculoskeletal: Normal range of motion. She exhibits no edema or tenderness.  Lymphadenopathy:    She has no cervical adenopathy.  Neurological: She is alert and oriented to person, place, and time. Coordination normal.  Skin: Skin is warm and dry. No rash noted. No erythema.  Psychiatric: She has a normal mood and affect. Her behavior is normal. Judgment and thought content normal.    Assessment and Plan  Nursing note and vitals reviewed.

## 2015-12-15 NOTE — Patient Instructions (Signed)
You are doing well. No medication changes were made.  For dizzy spells when you stand up or low blood pressure on your monitor, Cut the hydralazine in 1/2 three times a day  Please call us if you have new issues that need to be addressed before your next appt.  Your physician wants you to follow-up in: 6 months.  You will receive a reminder letter in the mail two months in advance. If you don't receive a letter, please call our office to schedule the follow-up appointment.

## 2015-12-15 NOTE — Assessment & Plan Note (Signed)
Congratulated her on her recent weight loss, more than 15 pounds since her last clinic visit She is working out at [employer], now has a strict diet

## 2015-12-15 NOTE — Assessment & Plan Note (Signed)
She appears relatively euvolemic on today's visit No changes to her medications

## 2015-12-15 NOTE — Assessment & Plan Note (Signed)
Blood pressure is well controlled on today's visit. No changes made to the medications. We have recommended if blood pressure runs low, that she cut her hydralazine down to 50 mg 3 times a day

## 2015-12-19 DIAGNOSIS — E785 Hyperlipidemia, unspecified: Secondary | ICD-10-CM | POA: Diagnosis not present

## 2015-12-19 DIAGNOSIS — R809 Proteinuria, unspecified: Secondary | ICD-10-CM | POA: Diagnosis not present

## 2015-12-19 DIAGNOSIS — N183 Chronic kidney disease, stage 3 (moderate): Secondary | ICD-10-CM | POA: Diagnosis not present

## 2015-12-19 DIAGNOSIS — I129 Hypertensive chronic kidney disease with stage 1 through stage 4 chronic kidney disease, or unspecified chronic kidney disease: Secondary | ICD-10-CM | POA: Diagnosis not present

## 2015-12-20 DIAGNOSIS — H04123 Dry eye syndrome of bilateral lacrimal glands: Secondary | ICD-10-CM | POA: Diagnosis not present

## 2015-12-20 DIAGNOSIS — H16142 Punctate keratitis, left eye: Secondary | ICD-10-CM | POA: Diagnosis not present

## 2015-12-22 DIAGNOSIS — L718 Other rosacea: Secondary | ICD-10-CM | POA: Diagnosis not present

## 2015-12-26 ENCOUNTER — Encounter: Payer: Self-pay | Admitting: Internal Medicine

## 2015-12-29 ENCOUNTER — Encounter: Payer: Self-pay | Admitting: Internal Medicine

## 2015-12-29 ENCOUNTER — Telehealth: Payer: Self-pay | Admitting: Internal Medicine

## 2015-12-29 ENCOUNTER — Ambulatory Visit (INDEPENDENT_AMBULATORY_CARE_PROVIDER_SITE_OTHER): Payer: Medicare Other | Admitting: Internal Medicine

## 2015-12-29 VITALS — BP 132/84 | HR 100 | Temp 98.4°F | Resp 20 | Ht 67.0 in | Wt 195.1 lb

## 2015-12-29 DIAGNOSIS — L719 Rosacea, unspecified: Secondary | ICD-10-CM | POA: Diagnosis not present

## 2015-12-29 DIAGNOSIS — F332 Major depressive disorder, recurrent severe without psychotic features: Secondary | ICD-10-CM

## 2015-12-29 DIAGNOSIS — G4700 Insomnia, unspecified: Secondary | ICD-10-CM

## 2015-12-29 DIAGNOSIS — N3946 Mixed incontinence: Secondary | ICD-10-CM | POA: Diagnosis not present

## 2015-12-29 MED ORDER — ZOLPIDEM TARTRATE 10 MG PO TABS: 10.0000 mg | ORAL_TABLET | Freq: Every evening | ORAL | Status: DC | PRN

## 2015-12-29 MED ORDER — METRONIDAZOLE 1 % EX GEL: Freq: Every day | CUTANEOUS | Status: DC

## 2015-12-29 MED ORDER — MIRTAZAPINE 15 MG PO TABS: 15.0000 mg | ORAL_TABLET | Freq: Every day | ORAL | Status: DC

## 2015-12-29 NOTE — Progress Notes (Signed)
Subjective:    Patient ID: Cassidy Ortiz, female    DOB: [birth year], 75 y.o.   MRN: [id number]  HPI  75YO female presents for consultation.  Seen by urology, recommended mid-urethral sling. She would like to know the potential benefits and risks of this procedure. She has an appointment scheduled for further evaluation with urology next week. She is currently soaking 6 or more pads per day with urine. Her clothes are often soaked with urine. This has led to poor quality of life.  Seen by ophthalmology. Started on Systane and Restasis for dry eyes. Told by dermatology she has Rosacea. Questions if treatment for rosacea might help with her dry eyes. Notes redness over her cheeks and nose which seems to be worse with using a face mask for CPAP. She has never previously been told she had rosacea. She does not generally have flushing with hot temperatures or sun exposure.  Insomnia - Having trouble staying asleep. Goes to bed at 11pm but watches TV for 4-5 hours. Wakes at 10am. Taking Ambien with no improvement. Feeling more depressed. Difficult time over the holidays. Her children will not speak with her.  Planning to see counselor next week, Dr. Richardson Landry.  Wt Readings from Last 3 Encounters:  12/29/15 195 lb 2 oz (88.508 kg)  12/15/15 197 lb 4 oz (89.472 kg)  11/29/15 191 lb 12 oz (86.977 kg)   BP Readings from Last 3 Encounters:  12/29/15 132/84  12/15/15 110/62  11/29/15 114/82    Past Medical History  Diagnosis Date  . Vitamin D deficiency   . Mixed hyperlipidemia   . Anxiety state, unspecified   . Depressive disorder, not elsewhere classified   . Obstructive sleep apnea (adult) (pediatric)   . Hypertensive kidney disease, benign   . Cerebral aneurysm, nonruptured 2008    s/p coil and shunt, performed in [place]  . Duodenal ulcer   . Chronic kidney disease, stage III (moderate)   . Acquired cyst of kidney   . Abnormal weight gain   . Fatty liver   . Diverticulosis of colon  (without mention of hemorrhage)   . Personal history of colonic polyps 10/22/2002    hyperplastic   . Hypertension   . Duodenal mass   . Baker's cyst of knee     Left, pt does not remember  . Edema   . SOB (shortness of breath)   . Malignant neoplasm of breast (female), unspecified site 2001    right, s/p lumpectomy and XRT  . 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 Unspecified hypothyroidism   . Grave's disease   . Hypothyroid   . Sleep apnea     off cpap  . Cerebral aneurysm rupture (HCC)     stent   Family History  Problem Relation Age of Onset  . Colon cancer Sister   . Cancer Sister     colon  . Heart disease Father   . Esophageal cancer Neg Hx   . Rectal cancer Neg Hx   . Stomach cancer Neg Hx    Past Surgical History  Procedure Laterality Date  . Breast lumpectomy Right     right  . Partial nephrectomy      right  . Shoulder surgery      left  . Cataract extraction      bilateral  . Coronary stent placement      brain  . Femoral artery repair  2011  .  Csf shunt  08/17/2008  . Hemiarthroplasty shoulder fracture      left  . Vaginal delivery      3  . Upper gastrointestinal endoscopy  11-10-2014  . Colonoscopy      last 2012.+ TA polyps   Social History   Social History  . Marital Status: Married    Spouse Name: N/A  . Number of Children: 3  . Years of Education: N/A   Occupational History  . retired    Social History Main Topics  . Smoking status: Former Smoker -- 1.00 packs/day for 43 years    Types: Cigarettes    Start date: 04/01/1964    Quit date: 11/24/2007  . Smokeless tobacco: Never Used     Comment: quit smoking 7 years ago  . Alcohol Use: 0.6 oz/week    1 Glasses of wine per week     Comment: occasssionally  . Drug Use: No  . Sexual Activity: Not Currently   Other Topics Concern  . None   Social History Narrative   Lives in [place] with husband. From [place]. Children live [place] and [place].   Pets  - 1 cat in home.      Work - retired Theme park manager, Educational psychologist   Diet - regular, limited calories          Review of Systems  Constitutional: Negative for fever, chills, appetite change, fatigue and unexpected weight change.  Eyes: Negative for visual disturbance.  Respiratory: Negative for shortness of breath.   Cardiovascular: Negative for chest pain and leg swelling.  Gastrointestinal: Negative for nausea, vomiting, abdominal pain, diarrhea and constipation.  Genitourinary: Positive for urgency and frequency. Negative for dysuria, flank pain, difficulty urinating and pelvic pain.  Skin: Positive for color change. Negative for rash.  Hematological: Negative for adenopathy. Does not bruise/bleed easily.  Psychiatric/Behavioral: Positive for sleep disturbance and dysphoric mood. Negative for suicidal ideas and self-injury. The patient is not nervous/anxious.        Objective:    BP 132/84 mmHg  Pulse 100  Temp(Src) 98.4 F (36.9 C) (Oral)  Resp 20  Ht 5\' 7"  (1.702 m)  Wt 195 lb 2 oz (88.508 kg)  BMI 30.55 kg/m2  SpO2 94% Physical Exam  Constitutional: She is oriented to person, place, and time. She appears well-developed and well-nourished. No distress.  HENT:  Head: Normocephalic and atraumatic.  Right Ear: External ear normal.  Left Ear: External ear normal.  Nose: Nose normal.  Mouth/Throat: Oropharynx is clear and moist. No oropharyngeal exudate.  Eyes: Conjunctivae are normal. Pupils are equal, round, and reactive to light. Right eye exhibits no discharge. Left eye exhibits no discharge. No scleral icterus.  Neck: Normal range of motion. Neck supple. No tracheal deviation present. No thyromegaly present.  Cardiovascular: Normal rate, regular rhythm, normal heart sounds and intact distal pulses.  Exam reveals no gallop and no friction rub.   No murmur heard. Pulmonary/Chest: Effort normal and breath sounds normal. No respiratory distress. She has no wheezes. She has no  rales. She exhibits no tenderness.  Musculoskeletal: Normal range of motion. She exhibits no edema or tenderness.  Lymphadenopathy:    She has no cervical adenopathy.  Neurological: She is alert and oriented to person, place, and time. No cranial nerve deficit. She exhibits normal muscle tone. Coordination normal.  Skin: Skin is warm and dry. Rash noted. Rash is papular (erythematous papular rash over cheeks). She is not diaphoretic. There is erythema (mild over cheeks). No pallor.  Psychiatric:  Her speech is normal. Judgment and thought content normal. She is withdrawn. Cognition and memory are normal. She exhibits a depressed mood.          Assessment & Plan:  Over 36min of which >50% spent in face-to-face contact with patient and her husband discussing plan of care, including reviewing specialist notes with her and her husband.  Problem List Items Addressed This Visit      Unprioritized   Insomnia    Worsening insomnia, exacerbated by lack of physical activity and severe depression. Discussed some options today including trying Lunesta. Will increase Remeron. She will email with update next week. Follow up in 2 weeks.      Major depressive disorder, recurrent, severe without psychotic features ([place]) - Primary    Recurrent severe depression. Worsened with Holidays and estrangement from her children. Offered support. Encouraged her to start back exercising on a daily basis. Increase nightly remeron to 45mg . Counseling with Dr. Richardson Landry starting next month. Follow up with Dr. Nicolasa Ducking as scheduled.      Relevant Medications   mirtazapine (REMERON) 15 MG tablet   Mixed stress and urge urinary incontinence    Reviewed notes from Dr. Tresa Moore. Encouraged patient to follow up with specialist next week to discuss mid urethral sling. Review of literature suggests favorable outcomes in 70-90% of patients.      Rosacea    Symptoms c/w rosacea. Will start topical Metrogel. Follow up recheck 2  weeks.      Relevant Medications   metroNIDAZOLE (METROGEL) 1 % gel       Return in about 2 weeks (around 01/12/2016) for Recheck.

## 2015-12-29 NOTE — Telephone Encounter (Signed)
Follow up.

## 2015-12-29 NOTE — Progress Notes (Signed)
Pre visit review using our clinic review tool, if applicable. No additional management support is needed unless otherwise documented below in the visit note. 

## 2015-12-29 NOTE — Patient Instructions (Signed)
Increase Remeron to 45mg  daily.  Continue Ambien 10mg  at bedtime.  Follow up 2 weeks.

## 2015-12-29 NOTE — Assessment & Plan Note (Signed)
Symptoms c/w rosacea. Will start topical Metrogel. Follow up recheck 2 weeks.

## 2015-12-29 NOTE — Assessment & Plan Note (Signed)
Recurrent severe depression. Worsened with Holidays and estrangement from her children. Offered support. Encouraged her to start back exercising on a daily basis. Increase nightly remeron to 45mg . Counseling with Dr. Richardson Landry starting next month. Follow up with Dr. Nicolasa Ducking as scheduled.

## 2015-12-29 NOTE — Assessment & Plan Note (Signed)
Worsening insomnia, exacerbated by lack of physical activity and severe depression. Discussed some options today including trying Lunesta. Will increase Remeron. She will email with update next week. Follow up in 2 weeks.

## 2015-12-29 NOTE — Assessment & Plan Note (Signed)
Reviewed notes from Dr. Tresa Moore. Encouraged patient to follow up with specialist next week to discuss mid urethral sling. Review of literature suggests favorable outcomes in 70-90% of patients.

## 2016-01-07 ENCOUNTER — Encounter: Payer: Self-pay | Admitting: Internal Medicine

## 2016-01-11 DIAGNOSIS — R351 Nocturia: Secondary | ICD-10-CM | POA: Diagnosis not present

## 2016-01-11 DIAGNOSIS — Z0000 Encounter for general adult medical examination without abnormal findings: Secondary | ICD-10-CM | POA: Diagnosis not present

## 2016-01-11 DIAGNOSIS — N3946 Mixed incontinence: Secondary | ICD-10-CM | POA: Diagnosis not present

## 2016-01-12 ENCOUNTER — Encounter: Payer: Self-pay | Admitting: Internal Medicine

## 2016-01-12 ENCOUNTER — Ambulatory Visit (INDEPENDENT_AMBULATORY_CARE_PROVIDER_SITE_OTHER): Payer: Medicare Other | Admitting: Internal Medicine

## 2016-01-12 VITALS — BP 130/65 | HR 60 | Temp 98.5°F | Ht 67.0 in | Wt 197.0 lb

## 2016-01-12 DIAGNOSIS — G4700 Insomnia, unspecified: Secondary | ICD-10-CM | POA: Diagnosis not present

## 2016-01-12 DIAGNOSIS — F332 Major depressive disorder, recurrent severe without psychotic features: Secondary | ICD-10-CM

## 2016-01-12 MED ORDER — TRAZODONE HCL 50 MG PO TABS: 50.0000 mg | ORAL_TABLET | Freq: Every day | ORAL | Status: DC

## 2016-01-12 NOTE — Assessment & Plan Note (Signed)
Add back Trazodone 50mg  at bedtime. Follow up 1 week. She understands the potential risks of medications to aid with sleep.

## 2016-01-12 NOTE — Progress Notes (Signed)
Subjective:    Patient ID: Cassidy Ortiz, female    DOB: 06/20/1941, 75 y.o.   MRN: [id number]  HPI  75YO female presents for follow up.  Insomnia - Goes to bed around 10-11pm. Sleeps about 67min then up and cannot sleep. No napping during day. Taking 10mg  of Ambien with no improvement. Not exercising.  Depression - Feels that counseling going well. Meeting weekly with counselor.   Wt Readings from Last 3 Encounters:  01/12/16 197 lb (89.359 kg)  12/29/15 195 lb 2 oz (88.508 kg)  12/15/15 197 lb 4 oz (89.472 kg)   BP Readings from Last 3 Encounters:  01/12/16 130/65  12/29/15 132/84  12/15/15 110/62    Past Medical History  Diagnosis Date  . Vitamin D deficiency   . Mixed hyperlipidemia   . Anxiety state, unspecified   . Depressive disorder, not elsewhere classified   . Obstructive sleep apnea (adult) (pediatric)   . Hypertensive kidney disease, benign   . Cerebral aneurysm, nonruptured 2008    s/p coil and shunt, performed in [place]  . Duodenal ulcer   . Chronic kidney disease, stage III (moderate)   . Acquired cyst of kidney   . Abnormal weight gain   . Fatty liver   . Diverticulosis of colon (without mention of hemorrhage)   . Personal history of colonic polyps 10/22/2002    hyperplastic   . Hypertension   . Duodenal mass   . Baker's cyst of knee     Left, pt does not remember  . Edema   . SOB (shortness of breath)   . Malignant neoplasm of breast (female), unspecified site 2001    right, s/p lumpectomy and XRT  . 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 Unspecified hypothyroidism   . Grave's disease   . Hypothyroid   . Sleep apnea     off cpap  . Cerebral aneurysm rupture (HCC)     stent   Family History  Problem Relation Age of Onset  . Colon cancer Sister   . Cancer Sister     colon  . Heart disease Father   . Esophageal cancer Neg Hx   . Rectal cancer Neg Hx   . Stomach cancer Neg Hx    Past Surgical  History  Procedure Laterality Date  . Breast lumpectomy Right     right  . Partial nephrectomy      right  . Shoulder surgery      left  . Cataract extraction      bilateral  . Coronary stent placement      brain  . Femoral artery repair  2011  . Csf shunt  08/17/2008  . Hemiarthroplasty shoulder fracture      left  . Vaginal delivery      3  . Upper gastrointestinal endoscopy  11-10-2014  . Colonoscopy      last 2012.+ TA polyps   Social History   Social History  . Marital Status: Married    Spouse Name: N/A  . Number of Children: 3  . Years of Education: N/A   Occupational History  . retired    Social History Main Topics  . Smoking status: Former Smoker -- 1.00 packs/day for 43 years    Types: Cigarettes    Start date: 04/01/1964    Quit date: 11/24/2007  . Smokeless tobacco: Never Used     Comment: quit smoking 7 years ago  . Alcohol  Use: 0.6 oz/week    1 Glasses of wine per week     Comment: occasssionally  . Drug Use: No  . Sexual Activity: Not Currently   Other Topics Concern  . None   Social History Narrative   Lives in [place] with husband. From [place]. Children live [place] and [place].   Pets - 1 cat in home.      Work - retired Theme park manager, Educational psychologist   Diet - regular, limited calories          Review of Systems  Constitutional: Negative for fever, chills, appetite change, fatigue and unexpected weight change.  Eyes: Negative for visual disturbance.  Respiratory: Negative for shortness of breath.   Cardiovascular: Negative for chest pain and leg swelling.  Gastrointestinal: Negative for abdominal pain.  Skin: Negative for color change and rash.  Hematological: Negative for adenopathy. Does not bruise/bleed easily.  Psychiatric/Behavioral: Positive for sleep disturbance, dysphoric mood and decreased concentration. Negative for suicidal ideas, behavioral problems, confusion and agitation. The patient is not nervous/anxious.        Objective:    BP 130/65  mmHg  Pulse 60  Temp(Src) 98.5 F (36.9 C) (Oral)  Ht 5\' 7"  (1.702 m)  Wt 197 lb (89.359 kg)  BMI 30.85 kg/m2  SpO2 95% Physical Exam  Constitutional: She is oriented to person, place, and time. She appears well-developed and well-nourished. No distress.  HENT:  Head: Normocephalic and atraumatic.  Right Ear: External ear normal.  Left Ear: External ear normal.  Nose: Nose normal.  Mouth/Throat: Oropharynx is clear and moist. No oropharyngeal exudate.  Eyes: Conjunctivae are normal. Pupils are equal, round, and reactive to light. Right eye exhibits no discharge. Left eye exhibits no discharge. No scleral icterus.  Neck: Normal range of motion. Neck supple. No tracheal deviation present. No thyromegaly present.  Cardiovascular: Normal rate, regular rhythm, normal heart sounds and intact distal pulses.  Exam reveals no gallop and no friction rub.   No murmur heard. Pulmonary/Chest: Effort normal and breath sounds normal. No respiratory distress. She has no wheezes. She has no rales. She exhibits no tenderness.  Musculoskeletal: Normal range of motion. She exhibits no edema or tenderness.  Lymphadenopathy:    She has no cervical adenopathy.  Neurological: She is alert and oriented to person, place, and time. No cranial nerve deficit. She exhibits normal muscle tone. Coordination normal.  Skin: Skin is warm and dry. No rash noted. She is not diaphoretic. No erythema. No pallor.  Psychiatric: Her speech is normal and behavior is normal. Judgment and thought content normal. She exhibits a depressed mood. She expresses no suicidal ideation.          Assessment & Plan:   Problem List Items Addressed This Visit      Unprioritized   Insomnia - Primary    Add back Trazodone 50mg  at bedtime. Follow up 1 week. She understands the potential risks of medications to aid with sleep.      Major depressive disorder, recurrent, severe without psychotic features [facility])    Encouraged her to  continue with counseling and psychiatric care with Dr. Nicolasa Ducking. Continue current medication.      Relevant Medications   traZODone (DESYREL) 50 MG tablet       Return in about 1 year (around 01/11/2017) for Recheck.

## 2016-01-12 NOTE — Patient Instructions (Signed)
Start Trazodone 50mg  at bedtime. If no improvement, then increase to 100mg  daily.

## 2016-01-12 NOTE — Assessment & Plan Note (Signed)
Encouraged her to continue with counseling and psychiatric care with Dr. Nicolasa Ducking. Continue current medication.

## 2016-01-12 NOTE — Progress Notes (Signed)
Pre visit review using our clinic review tool, if applicable. No additional management support is needed unless otherwise documented below in the visit note. 

## 2016-01-16 ENCOUNTER — Encounter: Payer: Self-pay | Admitting: Internal Medicine

## 2016-01-16 ENCOUNTER — Ambulatory Visit (INDEPENDENT_AMBULATORY_CARE_PROVIDER_SITE_OTHER): Payer: Medicare Other | Admitting: Internal Medicine

## 2016-01-16 VITALS — BP 132/68 | HR 51 | Temp 98.4°F | Ht 67.0 in | Wt 197.4 lb

## 2016-01-16 DIAGNOSIS — F332 Major depressive disorder, recurrent severe without psychotic features: Secondary | ICD-10-CM | POA: Diagnosis not present

## 2016-01-16 DIAGNOSIS — G4700 Insomnia, unspecified: Secondary | ICD-10-CM

## 2016-01-16 NOTE — Progress Notes (Signed)
Pre visit review using our clinic review tool, if applicable. No additional management support is needed unless otherwise documented below in the visit note. 

## 2016-01-16 NOTE — Assessment & Plan Note (Signed)
Symptoms improved with weekly counseling with Dr. Tollie Pizza. Will continue.

## 2016-01-16 NOTE — Patient Instructions (Addendum)
Continue current medications.  We will set up mammogram.

## 2016-01-16 NOTE — Progress Notes (Signed)
Subjective:    Patient ID: Cassidy Ortiz, female    DOB: 26-Jun-1941, 75 y.o.   MRN: [id number]  HPI  75YO female presents for follow up.  Last seen 2/3. Started back on Trazodone to help with sleep.  Insomnia - Slept well for the last two nights. No daytime somnolence with medication.  Depression - Symptoms improving. Feels good connection to her counselor, Dr. Tollie Pizza. Going weekly.   Wt Readings from Last 3 Encounters:  01/16/16 197 lb 6 oz (89.529 kg)  01/12/16 197 lb (89.359 kg)  12/29/15 195 lb 2 oz (88.508 kg)   BP Readings from Last 3 Encounters:  01/16/16 132/68  01/12/16 130/65  12/29/15 132/84    Past Medical History  Diagnosis Date  . Vitamin D deficiency   . Mixed hyperlipidemia   . Anxiety state, unspecified   . Depressive disorder, not elsewhere classified   . Obstructive sleep apnea (adult) (pediatric)   . Hypertensive kidney disease, benign   . Cerebral aneurysm, nonruptured 2008    s/p coil and shunt, performed in [place]  . Duodenal ulcer   . Chronic kidney disease, stage III (moderate)   . Acquired cyst of kidney   . Abnormal weight gain   . Fatty liver   . Diverticulosis of colon (without mention of hemorrhage)   . Personal history of colonic polyps 10/22/2002    hyperplastic   . Hypertension   . Duodenal mass   . Baker's cyst of knee     Left, pt does not remember  . Edema   . SOB (shortness of breath)   . Malignant neoplasm of breast (female), unspecified site 2001    right, s/p lumpectomy and XRT  . 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 Unspecified hypothyroidism   . Grave's disease   . Hypothyroid   . Sleep apnea     off cpap  . Cerebral aneurysm rupture (HCC)     stent   Family History  Problem Relation Age of Onset  . Colon cancer Sister   . Cancer Sister     colon  . Heart disease Father   . Esophageal cancer Neg Hx   . Rectal cancer Neg Hx   . Stomach cancer Neg Hx    Past Surgical  History  Procedure Laterality Date  . Breast lumpectomy Right     right  . Partial nephrectomy      right  . Shoulder surgery      left  . Cataract extraction      bilateral  . Coronary stent placement      brain  . Femoral artery repair  2011  . Csf shunt  08/17/2008  . Hemiarthroplasty shoulder fracture      left  . Vaginal delivery      3  . Upper gastrointestinal endoscopy  11-10-2014  . Colonoscopy      last 2012.+ TA polyps   Social History   Social History  . Marital Status: Married    Spouse Name: N/A  . Number of Children: 3  . Years of Education: N/A   Occupational History  . retired    Social History Main Topics  . Smoking status: Former Smoker -- 1.00 packs/day for 43 years    Types: Cigarettes    Start date: 04/01/1964    Quit date: 11/24/2007  . Smokeless tobacco: Never Used     Comment: quit smoking 7 years ago  .  Alcohol Use: 0.6 oz/week    1 Glasses of wine per week     Comment: occasssionally  . Drug Use: No  . Sexual Activity: Not Currently   Other Topics Concern  . None   Social History Narrative   Lives in [place] with husband. From [place]. Children live [place] and [place].   Pets - 1 cat in home.      Work - retired Theme park manager, Educational psychologist   Diet - regular, limited calories          Review of Systems  Constitutional: Negative for fever, chills, appetite change, fatigue and unexpected weight change.  Eyes: Negative for visual disturbance.  Respiratory: Negative for shortness of breath.   Cardiovascular: Negative for chest pain and leg swelling.  Gastrointestinal: Negative for abdominal pain.  Skin: Negative for color change and rash.  Hematological: Negative for adenopathy. Does not bruise/bleed easily.  Psychiatric/Behavioral: Negative for suicidal ideas, sleep disturbance and dysphoric mood. The patient is not nervous/anxious.        Objective:    BP 132/68 mmHg  Pulse 51  Temp(Src) 98.4 F (36.9 C) (Oral)  Ht 5\' 7"  (1.702 m)  Wt 197  lb 6 oz (89.529 kg)  BMI 30.91 kg/m2  SpO2 95% Physical Exam  Constitutional: She is oriented to person, place, and time. She appears well-developed and well-nourished. No distress.  HENT:  Head: Normocephalic and atraumatic.  Right Ear: External ear normal.  Left Ear: External ear normal.  Nose: Nose normal.  Mouth/Throat: Oropharynx is clear and moist. No oropharyngeal exudate.  Eyes: Conjunctivae are normal. Pupils are equal, round, and reactive to light. Right eye exhibits no discharge. Left eye exhibits no discharge. No scleral icterus.  Neck: Normal range of motion. Neck supple. No tracheal deviation present. No thyromegaly present.  Cardiovascular: Normal rate, regular rhythm, normal heart sounds and intact distal pulses.  Exam reveals no gallop and no friction rub.   No murmur heard. Pulmonary/Chest: Effort normal and breath sounds normal. No respiratory distress. She has no wheezes. She has no rales. She exhibits no tenderness.  Musculoskeletal: Normal range of motion. She exhibits no edema or tenderness.  Lymphadenopathy:    She has no cervical adenopathy.  Neurological: She is alert and oriented to person, place, and time. No cranial nerve deficit. She exhibits normal muscle tone. Coordination normal.  Skin: Skin is warm and dry. No rash noted. She is not diaphoretic. No erythema. No pallor.  Psychiatric: She has a normal mood and affect. Her speech is normal and behavior is normal. Judgment and thought content normal. Cognition and memory are normal.          Assessment & Plan:   Problem List Items Addressed This Visit      Unprioritized   Insomnia - Primary    Symptoms much improved with Trazodone. Will continue. Follow up 3 months and prn.      Major depressive disorder, recurrent, severe without psychotic features ([facility])    Symptoms improved with weekly counseling with Dr. Tollie Pizza. Will continue.          Return in about 3 months (around 04/14/2016) for  Recheck.

## 2016-01-16 NOTE — Assessment & Plan Note (Signed)
Symptoms much improved with Trazodone. Will continue. Follow up 3 months and prn.

## 2016-01-24 ENCOUNTER — Encounter: Payer: Self-pay | Admitting: Internal Medicine

## 2016-02-02 ENCOUNTER — Other Ambulatory Visit: Payer: Self-pay | Admitting: Internal Medicine

## 2016-02-06 DIAGNOSIS — Z0000 Encounter for general adult medical examination without abnormal findings: Secondary | ICD-10-CM | POA: Diagnosis not present

## 2016-02-06 DIAGNOSIS — N3946 Mixed incontinence: Secondary | ICD-10-CM | POA: Diagnosis not present

## 2016-02-06 DIAGNOSIS — H04123 Dry eye syndrome of bilateral lacrimal glands: Secondary | ICD-10-CM | POA: Diagnosis not present

## 2016-02-12 ENCOUNTER — Ambulatory Visit
Admission: RE | Admit: 2016-02-12 | Discharge: 2016-02-12 | Disposition: A | Discharge status: Home / Self Care | Payer: Medicare Other | Source: Ambulatory Visit | Attending: Internal Medicine | Admitting: Internal Medicine

## 2016-02-12 DIAGNOSIS — Z13820 Encounter for screening for osteoporosis: Secondary | ICD-10-CM | POA: Diagnosis not present

## 2016-02-12 DIAGNOSIS — Z1231 Encounter for screening mammogram for malignant neoplasm of breast: Secondary | ICD-10-CM | POA: Insufficient documentation

## 2016-02-12 DIAGNOSIS — M8580 Other specified disorders of bone density and structure, unspecified site: Secondary | ICD-10-CM | POA: Insufficient documentation

## 2016-02-12 DIAGNOSIS — IMO0001 Reserved for inherently not codable concepts without codable children: Secondary | ICD-10-CM | POA: Insufficient documentation

## 2016-02-12 DIAGNOSIS — R921 Mammographic calcification found on diagnostic imaging of breast: Secondary | ICD-10-CM | POA: Diagnosis not present

## 2016-02-12 DIAGNOSIS — Z780 Asymptomatic menopausal state: Secondary | ICD-10-CM | POA: Diagnosis not present

## 2016-02-12 DIAGNOSIS — M85851 Other specified disorders of bone density and structure, right thigh: Secondary | ICD-10-CM | POA: Diagnosis not present

## 2016-02-12 DIAGNOSIS — Z1239 Encounter for other screening for malignant neoplasm of breast: Secondary | ICD-10-CM

## 2016-02-12 DIAGNOSIS — IMO0002 Reserved for concepts with insufficient information to code with codable children: Secondary | ICD-10-CM

## 2016-02-12 HISTORY — DX: Malignant neoplasm of unspecified site of unspecified female breast: C50.919

## 2016-02-12 HISTORY — DX: Reserved for concepts with insufficient information to code with codable children: IMO0002

## 2016-02-14 ENCOUNTER — Other Ambulatory Visit: Payer: Self-pay | Admitting: Internal Medicine

## 2016-02-14 DIAGNOSIS — R921 Mammographic calcification found on diagnostic imaging of breast: Secondary | ICD-10-CM

## 2016-02-15 DIAGNOSIS — F332 Major depressive disorder, recurrent severe without psychotic features: Secondary | ICD-10-CM | POA: Diagnosis not present

## 2016-02-15 DIAGNOSIS — G4700 Insomnia, unspecified: Secondary | ICD-10-CM | POA: Diagnosis not present

## 2016-02-19 ENCOUNTER — Telehealth: Payer: Self-pay

## 2016-02-19 NOTE — Telephone Encounter (Signed)
Spoke with patient and scheduled for 1pm tomorrow. Thanks.

## 2016-02-19 NOTE — Telephone Encounter (Signed)
There is a patient at 1130 already tomorrow, please advise?

## 2016-02-19 NOTE — Telephone Encounter (Signed)
1pm tomorrow 49min

## 2016-02-19 NOTE — Telephone Encounter (Signed)
Pt states that she just received her mammo results and she wants to talk to you about the findings of her left brest. Pt's anxiety has grown b/c of history of breast cancer diagnosis in 2001. Pt is getting ready to go on vaction and is very stressed. Please advise

## 2016-02-19 NOTE — Telephone Encounter (Signed)
11:30am visit tomorrow for 28min

## 2016-02-20 ENCOUNTER — Ambulatory Visit (INDEPENDENT_AMBULATORY_CARE_PROVIDER_SITE_OTHER): Payer: Medicare Other | Admitting: Internal Medicine

## 2016-02-20 ENCOUNTER — Encounter: Payer: Self-pay | Admitting: Internal Medicine

## 2016-02-20 VITALS — BP 162/78 | HR 47 | Temp 98.1°F | Wt 194.0 lb

## 2016-02-20 DIAGNOSIS — N3946 Mixed incontinence: Secondary | ICD-10-CM | POA: Diagnosis not present

## 2016-02-20 DIAGNOSIS — G4700 Insomnia, unspecified: Secondary | ICD-10-CM

## 2016-02-20 DIAGNOSIS — R928 Other abnormal and inconclusive findings on diagnostic imaging of breast: Secondary | ICD-10-CM | POA: Diagnosis not present

## 2016-02-20 DIAGNOSIS — M8580 Other specified disorders of bone density and structure, unspecified site: Secondary | ICD-10-CM

## 2016-02-20 NOTE — Progress Notes (Signed)
Subjective:    Patient ID: Cassidy Ortiz, female    DOB: [birth year], 75 y.o.   MRN: [id number]  HPI  75YO female presents for acute visit.  Concerned about mammogram and bone density results.  Mammogram - 02/12/2016 left breast notable for calcifications. Right breast was normal. Additional imaging with diagnostic mammogram and [place] pending.  Bone density - T-score -2.1 which is consistent with osteopenia.  Insomnia - Recently seen by Dr. Nicolasa Ducking. Ambien and Trazodone stopped. Started on Temazepam. Reports sleeping poorly, typically less than 4hr per night. Planning to start Melatonin, but has not started this.  Seen by Urology. Symptoms of urinary incontinence nearly completely resolved with Vesicare. However had dry mouth with this medication.  Planning to leave on a cruise at the end of this week.  Wt Readings from Last 3 Encounters:  02/20/16 194 lb (87.998 kg)  01/16/16 197 lb 6 oz (89.529 kg)  01/12/16 197 lb (89.359 kg)   BP Readings from Last 3 Encounters:  02/20/16 162/78  01/16/16 132/68  01/12/16 130/65    Past Medical History  Diagnosis Date  . Vitamin D deficiency   . Mixed hyperlipidemia   . Anxiety state, unspecified   . Depressive disorder, not elsewhere classified   . Obstructive sleep apnea (adult) (pediatric)   . Hypertensive kidney disease, benign   . Cerebral aneurysm, nonruptured 2008    s/p coil and shunt, performed in [place]  . Duodenal ulcer   . Chronic kidney disease, stage III (moderate)   . Acquired cyst of kidney   . Abnormal weight gain   . Fatty liver   . Diverticulosis of colon (without mention of hemorrhage)   . Personal history of colonic polyps 10/22/2002    hyperplastic   . Hypertension   . Duodenal mass   . Baker's cyst of knee     Left, pt does not remember  . Edema   . SOB (shortness of breath)   . Malignant neoplasm of breast (female), unspecified site 2001    right, s/p lumpectomy and XRT  . Malignant neoplasm of kidney, except  pelvis 2008    s/p partial nephrectomy  . Unspecified hypothyroidism   . Grave's disease   . Hypothyroid   . Sleep apnea     off cpap  . Cerebral aneurysm rupture (HCC)     stent  . Breast cancer ([place]) 2001    RT LUMPECTOMY  . Radiation 2001    BREAST CA   Family History  Problem Relation Age of Onset  . Colon cancer Sister   . Cancer Sister     colon  . Heart disease Father   . Esophageal cancer Neg Hx   . Rectal cancer Neg Hx   . Stomach cancer Neg Hx    Past Surgical History  Procedure Laterality Date  . Breast lumpectomy Right     right  . Partial nephrectomy      right  . Shoulder surgery      left  . Cataract extraction      bilateral  . Coronary stent placement      brain  . Femoral artery repair  2011  . Csf shunt  08/17/2008  . Hemiarthroplasty shoulder fracture      left  . Vaginal delivery      3  . Upper gastrointestinal endoscopy  11-10-2014  . Colonoscopy      last 2012.+ TA polyps   Social History   Social History  . Marital  Status: Married    Spouse Name: N/A  . Number of Children: 3  . Years of Education: N/A   Occupational History  . retired    Social History Main Topics  . Smoking status: Former Smoker -- 1.00 packs/day for 43 years    Types: Cigarettes    Start date: 04/01/1964    Quit date: 11/24/2007  . Smokeless tobacco: Never Used     Comment: quit smoking 7 years ago  . Alcohol Use: 0.6 oz/week    1 Glasses of wine per week     Comment: occasssionally  . Drug Use: No  . Sexual Activity: Not Currently   Other Topics Concern  . None   Social History Narrative   Lives in [place] with husband. From [place]. Children live [place] and [place].   Pets - 1 cat in home.      Work - retired Theme park manager, Educational psychologist   Diet - regular, limited calories          Review of Systems  Constitutional: Negative for fever, chills, appetite change, fatigue and unexpected weight change.  Eyes: Negative for visual disturbance.  Respiratory: Negative  for shortness of breath.   Cardiovascular: Negative for chest pain, palpitations and leg swelling.  Gastrointestinal: Negative for abdominal pain, diarrhea and constipation.  Musculoskeletal: Negative for myalgias and arthralgias.  Skin: Negative for color change and rash.  Hematological: Negative for adenopathy. Does not bruise/bleed easily.  Psychiatric/Behavioral: Positive for sleep disturbance. Negative for suicidal ideas and dysphoric mood. The patient is not nervous/anxious.        Objective:    BP 162/78 mmHg  Pulse 47  Temp(Src) 98.1 F (36.7 C) (Oral)  Wt 194 lb (87.998 kg)  SpO2 95% Physical Exam  Constitutional: She is oriented to person, place, and time. She appears well-developed and well-nourished. No distress.  HENT:  Head: Normocephalic and atraumatic.  Right Ear: External ear normal.  Left Ear: External ear normal.  Nose: Nose normal.  Mouth/Throat: Oropharynx is clear and moist. No oropharyngeal exudate.  Eyes: Conjunctivae are normal. Pupils are equal, round, and reactive to light. Right eye exhibits no discharge. Left eye exhibits no discharge. No scleral icterus.  Neck: Normal range of motion. Neck supple. No tracheal deviation present. No thyromegaly present.  Cardiovascular: Normal rate, regular rhythm, normal heart sounds and intact distal pulses.  Exam reveals no gallop and no friction rub.   No murmur heard. Pulmonary/Chest: Effort normal and breath sounds normal. No respiratory distress. She has no wheezes. She has no rales. She exhibits no tenderness.  Musculoskeletal: Normal range of motion. She exhibits no edema or tenderness.  Lymphadenopathy:    She has no cervical adenopathy.  Neurological: She is alert and oriented to person, place, and time. No cranial nerve deficit. She exhibits normal muscle tone. Coordination normal.  Skin: Skin is warm and dry. No rash noted. She is not diaphoretic. No erythema. No pallor.  Psychiatric: She has a normal mood  and affect. Her behavior is normal. Judgment and thought content normal.          Assessment & Plan:   Problem List Items Addressed This Visit      Unprioritized   Abnormal mammogram of left breast    Recent mammogram showed microcalcifications in the left breast. Mammogram with additional views scheduled for 03/13/2015. Will follow up after additional imaging complete.      Insomnia - Primary    Recently stopped Ambien and Trazodone at direction of Dr. Nicolasa Ducking.  Started on Temazepam. We discussed that it may take a few weeks for sleep to improve. Will continue to monitor.      Mixed stress and urge urinary incontinence    Started on Vesicare with significant improvement in symptoms. Dry mouth noted. Will continue to follow up with urology.      Osteopenia    Reviewed results of bone density testing. T-score -2.1. 10 year risk of fracture 19%, 4% hip. Discussed options for treatment. She is a poor candidate for oral bisphosphonates because of GERD/Barretts. Recommended consideration of Reclast. Will see is this could be administered at Dr. Antonieta Pert office. Encouraged physical activity, such as walking, adequate dietary calcium and supplemental Vit D. We will plan to check Vit D with next labs.          Return in about 4 weeks (around 03/19/2016) for Recheck.  Ronette Deter, MD [facility]

## 2016-02-20 NOTE — Assessment & Plan Note (Signed)
Started on Vesicare with significant improvement in symptoms. Dry mouth noted. Will continue to follow up with urology.

## 2016-02-20 NOTE — Assessment & Plan Note (Signed)
Recent mammogram showed microcalcifications in the left breast. Mammogram with additional views scheduled for 03/13/2015. Will follow up after additional imaging complete.

## 2016-02-20 NOTE — Progress Notes (Signed)
Pre visit review using our clinic review tool, if applicable. No additional management support is needed unless otherwise documented below in the visit note. 

## 2016-02-20 NOTE — Assessment & Plan Note (Signed)
Reviewed results of bone density testing. T-score -2.1. 10 year risk of fracture 19%, 4% hip. Discussed options for treatment. She is a poor candidate for oral bisphosphonates because of GERD/Barretts. Recommended consideration of Reclast. Will see is this could be administered at Dr. Antonieta Pert office. Encouraged physical activity, such as walking, adequate dietary calcium and supplemental Vit D. We will plan to check Vit D with next labs.

## 2016-02-20 NOTE — Assessment & Plan Note (Signed)
Recently stopped Ambien and Trazodone at direction of Dr. Nicolasa Ducking. Started on Temazepam. We discussed that it may take a few weeks for sleep to improve. Will continue to monitor.

## 2016-02-20 NOTE — Patient Instructions (Signed)
We will check with Dr. Marin Olp to see if his office gives Reclast infusion for osteopenia.  We will follow up after the mammogram.

## 2016-03-09 ENCOUNTER — Other Ambulatory Visit: Payer: Self-pay | Admitting: Internal Medicine

## 2016-03-12 ENCOUNTER — Ambulatory Visit: Payer: Medicare Other | Admitting: Internal Medicine

## 2016-03-12 ENCOUNTER — Other Ambulatory Visit: Payer: Self-pay | Admitting: Internal Medicine

## 2016-03-12 ENCOUNTER — Ambulatory Visit (INDEPENDENT_AMBULATORY_CARE_PROVIDER_SITE_OTHER): Payer: BLUE CROSS/BLUE SHIELD | Admitting: Internal Medicine

## 2016-03-12 ENCOUNTER — Encounter: Payer: Self-pay | Admitting: Internal Medicine

## 2016-03-12 ENCOUNTER — Ambulatory Visit
Admission: RE | Admit: 2016-03-12 | Discharge: 2016-03-12 | Disposition: A | Discharge status: Home / Self Care | Payer: Medicare Other | Source: Ambulatory Visit | Attending: Internal Medicine | Admitting: Internal Medicine

## 2016-03-12 VITALS — BP 99/59 | HR 60 | Temp 98.5°F | Ht 67.0 in | Wt 196.0 lb

## 2016-03-12 DIAGNOSIS — R921 Mammographic calcification found on diagnostic imaging of breast: Secondary | ICD-10-CM

## 2016-03-12 DIAGNOSIS — R920 Mammographic microcalcification found on diagnostic imaging of breast: Secondary | ICD-10-CM | POA: Diagnosis not present

## 2016-03-12 DIAGNOSIS — N3000 Acute cystitis without hematuria: Secondary | ICD-10-CM

## 2016-03-12 DIAGNOSIS — R350 Frequency of micturition: Secondary | ICD-10-CM

## 2016-03-12 LAB — POCT URINALYSIS DIPSTICK
Bilirubin, UA: NEGATIVE
Glucose, UA: NEGATIVE
Ketones, UA: NEGATIVE
NITRITE UA: NEGATIVE
PH UA: 6
Spec Grav, UA: >=1.03
UROBILINOGEN UA: 0.2

## 2016-03-12 MED ORDER — CIPROFLOXACIN HCL 250 MG PO TABS
250.0000 mg | ORAL_TABLET | Freq: Two times a day (BID) | ORAL | Status: DC
Start: 2016-03-12 — End: 2016-04-08

## 2016-03-12 NOTE — Patient Instructions (Signed)
Start Cipro twice daily.  Increase fluid intake.  Follow up if symptoms are not improving.

## 2016-03-12 NOTE — Progress Notes (Signed)
Pre visit review using our clinic review tool, if applicable. No additional management support is needed unless otherwise documented below in the visit note. 

## 2016-03-12 NOTE — Assessment & Plan Note (Signed)
Symptoms and exam most consistent with UTI. UA pos for blood and leuk. Will send urine culture. Start Cipro. Increase fluid intake. Azo prn.She will email or call if symptoms are not improving.

## 2016-03-12 NOTE — Progress Notes (Signed)
Subjective:    Patient ID: Cassidy Ortiz, female    DOB: 01-15-1941, 75 y.o.   MRN: [id number]  HPI  75YO female presents for acute visit.  Pressure and pain in lower abdomen.  Frequent urination with cloudy urine.  No fever, chills. No flank pain. No hematuria. Started yesterday. No other GI symptoms. Drinking cranberry juice with minimal improvement.    Wt Readings from Last 3 Encounters:  03/12/16 196 lb (88.905 kg)  02/20/16 194 lb (87.998 kg)  01/16/16 197 lb 6 oz (89.529 kg)   BP Readings from Last 3 Encounters:  03/12/16 99/59  02/20/16 162/78  01/16/16 132/68    Past Medical History  Diagnosis Date  . Vitamin D deficiency   . Mixed hyperlipidemia   . Anxiety state, unspecified   . Depressive disorder, not elsewhere classified   . Obstructive sleep apnea (adult) (pediatric)   . Hypertensive kidney disease, benign   . Cerebral aneurysm, nonruptured 2008    s/p coil and shunt, performed in [place]  . Duodenal ulcer   . Chronic kidney disease, stage III (moderate)   . Acquired cyst of kidney   . Abnormal weight gain   . Fatty liver   . Diverticulosis of colon (without mention of hemorrhage)   . Personal history of colonic polyps 10/22/2002    hyperplastic   . Hypertension   . Duodenal mass   . Baker's cyst of knee     Left, pt does not remember  . Edema   . SOB (shortness of breath)   . Malignant neoplasm of breast (female), unspecified site 2001    right, s/p lumpectomy and XRT  . 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 Unspecified hypothyroidism   . Grave's disease   . Hypothyroid   . Sleep apnea     off cpap  . Cerebral aneurysm rupture (HCC)     stent  . Breast cancer ([place]) 2001    RT LUMPECTOMY  . Radiation 2001    BREAST CA   Family History  Problem Relation Age of Onset  . Colon cancer Sister   . Cancer Sister     colon  . Heart disease Father   . Esophageal cancer Neg Hx   . Rectal cancer Neg Hx     . Stomach cancer Neg Hx    Past Surgical History  Procedure Laterality Date  . Breast lumpectomy Right     right  . Partial nephrectomy      right  . Shoulder surgery      left  . Cataract extraction      bilateral  . Coronary stent placement      brain  . Femoral artery repair  2011  . Csf shunt  08/17/2008  . Hemiarthroplasty shoulder fracture      left  . Vaginal delivery      3  . Upper gastrointestinal endoscopy  11-10-2014  . Colonoscopy      last 2012.+ TA polyps   Social History   Social History  . Marital Status: Married    Spouse Name: N/A  . Number of Children: 3  . Years of Education: N/A   Occupational History  . retired    Social History Main Topics  . Smoking status: Former Smoker -- 1.00 packs/day for 43 years    Types: Cigarettes    Start date: 04/01/1964    Quit date: 11/24/2007  . Smokeless tobacco: Never Used  Comment: quit smoking 7 years ago  . Alcohol Use: 0.6 oz/week    1 Glasses of wine per week     Comment: occasssionally  . Drug Use: No  . Sexual Activity: Not Currently   Other Topics Concern  . None   Social History Narrative   Lives in [place] with husband. From [place]. Children live [place] and [place].   Pets - 1 cat in home.      Work - retired Theme park manager, Educational psychologist   Diet - regular, limited calories          Review of Systems  Constitutional: Negative for fever, chills, appetite change, fatigue and unexpected weight change.  Eyes: Negative for visual disturbance.  Respiratory: Negative for shortness of breath.   Cardiovascular: Negative for chest pain and leg swelling.  Gastrointestinal: Positive for abdominal pain. Negative for nausea, vomiting, diarrhea, constipation and rectal pain.  Genitourinary: Positive for dysuria, urgency, frequency and pelvic pain. Negative for hematuria, flank pain, decreased urine volume, vaginal bleeding, vaginal discharge, difficulty urinating and vaginal pain.  Skin: Negative for color change and  rash.  Hematological: Negative for adenopathy. Does not bruise/bleed easily.  Psychiatric/Behavioral: Negative for dysphoric mood. The patient is not nervous/anxious.        Objective:    BP 99/59 mmHg  Pulse 60  Temp(Src) 98.5 F (36.9 C) (Oral)  Ht 5\' 7"  (1.702 m)  Wt 196 lb (88.905 kg)  BMI 30.69 kg/m2  SpO2 94% Physical Exam  Constitutional: She is oriented to person, place, and time. She appears well-developed and well-nourished. No distress.  HENT:  Head: Normocephalic and atraumatic.  Right Ear: External ear normal.  Left Ear: External ear normal.  Nose: Nose normal.  Mouth/Throat: Oropharynx is clear and moist. No oropharyngeal exudate.  Eyes: Conjunctivae are normal. Pupils are equal, round, and reactive to light. Right eye exhibits no discharge. Left eye exhibits no discharge. No scleral icterus.  Neck: Normal range of motion. Neck supple. No tracheal deviation present. No thyromegaly present.  Cardiovascular: Normal rate, regular rhythm, normal heart sounds and intact distal pulses.  Exam reveals no gallop and no friction rub.   No murmur heard. Pulmonary/Chest: Effort normal and breath sounds normal. No respiratory distress. She has no wheezes. She has no rales. She exhibits no tenderness.  Abdominal: Soft. She exhibits no distension and no mass. There is tenderness (suprapubic). There is no rebound.  Musculoskeletal: Normal range of motion. She exhibits no edema or tenderness.  Lymphadenopathy:    She has no cervical adenopathy.  Neurological: She is alert and oriented to person, place, and time. No cranial nerve deficit. She exhibits normal muscle tone. Coordination normal.  Skin: Skin is warm and dry. No rash noted. She is not diaphoretic. No erythema. No pallor.  Psychiatric: She has a normal mood and affect. Her behavior is normal. Judgment and thought content normal.          Assessment & Plan:   Problem List Items Addressed This Visit       Unprioritized   Acute cystitis without hematuria - Primary    Symptoms and exam most consistent with UTI. UA pos for blood and leuk. Will send urine culture. Start Cipro. Increase fluid intake. Azo prn.She will email or call if symptoms are not improving.      Relevant Medications   ciprofloxacin (CIPRO) 250 MG tablet   Other Relevant Orders   CULTURE, URINE COMPREHENSIVE    Other Visit Diagnoses    Urinary frequency  Relevant Orders    POCT Urinalysis Dipstick (Completed)        Return if symptoms worsen or fail to improve.  Ronette Deter, MD Internal [facility]

## 2016-03-13 ENCOUNTER — Encounter: Payer: Self-pay | Admitting: Internal Medicine

## 2016-03-13 ENCOUNTER — Telehealth: Payer: Self-pay | Admitting: *Deleted

## 2016-03-13 NOTE — Telephone Encounter (Signed)
I sent them a mychart. She is scheduled for breast biopsy.

## 2016-03-13 NOTE — Telephone Encounter (Signed)
Spoke with the patient.  It looks like her results were discussed yesterday and that you had left a message to speak with her, is there anything else you needed to talk to her about? Second, on her AVS yesterday she noticed that there is a possible duplicate of a drug notice that was at the bottom.  States that is has to do with one of her new drugs from the urologist.  Can you please look at her drug list and see if you see any duplicates, She thinks it may be the last one one the list.  Thanks.

## 2016-03-13 NOTE — Telephone Encounter (Signed)
Patient stated that she received a call from Dr. Gilford Rile to return her call.  Patient was not sure what the call was for.  Pt.contact [phone]

## 2016-03-14 ENCOUNTER — Ambulatory Visit
Admission: RE | Admit: 2016-03-14 | Discharge: 2016-03-14 | Disposition: A | Discharge status: Home / Self Care | Payer: Medicare Other | Source: Ambulatory Visit | Attending: Internal Medicine | Admitting: Internal Medicine

## 2016-03-14 ENCOUNTER — Ambulatory Visit: Payer: Medicare Other | Admitting: Internal Medicine

## 2016-03-14 DIAGNOSIS — D0582 Other specified type of carcinoma in situ of left breast: Secondary | ICD-10-CM | POA: Diagnosis not present

## 2016-03-14 DIAGNOSIS — C50212 Malignant neoplasm of upper-inner quadrant of left female breast: Secondary | ICD-10-CM | POA: Diagnosis not present

## 2016-03-14 DIAGNOSIS — R920 Mammographic microcalcification found on diagnostic imaging of breast: Secondary | ICD-10-CM | POA: Diagnosis not present

## 2016-03-14 HISTORY — PX: BREAST BIOPSY: SHX20

## 2016-03-15 ENCOUNTER — Other Ambulatory Visit: Payer: Medicare Other

## 2016-03-15 ENCOUNTER — Ambulatory Visit: Payer: Medicare Other | Admitting: Hematology & Oncology

## 2016-03-15 ENCOUNTER — Telehealth: Payer: Self-pay

## 2016-03-15 LAB — CULTURE, URINE COMPREHENSIVE

## 2016-03-15 NOTE — Telephone Encounter (Signed)
Attempted to call patient with results, but got answering machine. Will try again later tonight or this weekend to reach her with results.

## 2016-03-15 NOTE — Telephone Encounter (Signed)
STAT from Left Breast Stereotactic core needle biopsy.  Invasive carcinoma, DCIS both with calcifications.   She stated that the results will be in [facility] for viewing in just a few minutes.  She will have more results next week, per the technologist.   Please advise.

## 2016-03-18 ENCOUNTER — Other Ambulatory Visit: Payer: Self-pay | Admitting: Hematology & Oncology

## 2016-03-18 ENCOUNTER — Other Ambulatory Visit: Payer: Self-pay | Admitting: Internal Medicine

## 2016-03-18 DIAGNOSIS — C50919 Malignant neoplasm of unspecified site of unspecified female breast: Secondary | ICD-10-CM

## 2016-03-18 DIAGNOSIS — C50912 Malignant neoplasm of unspecified site of left female breast: Secondary | ICD-10-CM

## 2016-03-18 LAB — SURGICAL PATHOLOGY

## 2016-03-19 ENCOUNTER — Ambulatory Visit (INDEPENDENT_AMBULATORY_CARE_PROVIDER_SITE_OTHER): Payer: Medicare Other | Admitting: Internal Medicine

## 2016-03-19 ENCOUNTER — Encounter: Payer: Self-pay | Admitting: Internal Medicine

## 2016-03-19 VITALS — BP 174/78 | HR 58 | Temp 98.6°F | Ht 67.0 in | Wt 198.1 lb

## 2016-03-19 DIAGNOSIS — D0510 Intraductal carcinoma in situ of unspecified breast: Secondary | ICD-10-CM

## 2016-03-19 LAB — COMPREHENSIVE METABOLIC PANEL
ALK PHOS: 122 U/L — AB (ref 39–117)
ALT: 16 U/L (ref 0–35)
AST: 14 U/L (ref 0–37)
Albumin: 3.6 g/dL (ref 3.5–5.2)
BILIRUBIN TOTAL: 0.4 mg/dL (ref 0.2–1.2)
BUN: 26 mg/dL — AB (ref 6–23)
CHLORIDE: 106 meq/L (ref 96–112)
CO2: 31 meq/L (ref 19–32)
CREATININE: 1.42 mg/dL — AB (ref 0.40–1.20)
Calcium: 9.2 mg/dL (ref 8.4–10.5)
GFR: 38.53 mL/min — ABNORMAL LOW (ref >60.00)
Glucose, Bld: 94 mg/dL (ref 70–99)
Potassium: 4.3 mEq/L (ref 3.5–5.1)
Sodium: 144 mEq/L (ref 135–145)
TOTAL PROTEIN: 6.1 g/dL (ref 6.0–8.3)

## 2016-03-19 NOTE — Progress Notes (Signed)
Subjective:    Patient ID: Cassidy Ortiz, female    DOB: 1941/01/18, 75 y.o.   MRN: [id number]  HPI  75YO female presents for follow up.  Recently had breast biopsy which showed DCIS. ER/PR pos. Has evaluation with Dr. Dalbert Batman in general surgery pending for 4/21. MRI breast is pending for 4/17.   Tearful describing recent diagnosis. Concerned about upcoming surgery and potential changes to her appearance. Also notes difficult time caring for her sister in law who has dementia.   Wt Readings from Last 3 Encounters:  03/19/16 198 lb 2 oz (89.869 kg)  03/12/16 196 lb (88.905 kg)  02/20/16 194 lb (87.998 kg)   BP Readings from Last 3 Encounters:  03/19/16 174/78  03/12/16 99/59  02/20/16 162/78    Past Medical History  Diagnosis Date  . Vitamin D deficiency   . Mixed hyperlipidemia   . Anxiety state, unspecified   . Depressive disorder, not elsewhere classified   . Obstructive sleep apnea (adult) (pediatric)   . Hypertensive kidney disease, benign   . Cerebral aneurysm, nonruptured 2008    s/p coil and shunt, performed in [place]  . Duodenal ulcer   . Chronic kidney disease, stage III (moderate)   . Acquired cyst of kidney   . Abnormal weight gain   . Fatty liver   . Diverticulosis of colon (without mention of hemorrhage)   . Personal history of colonic polyps 10/22/2002    hyperplastic   . Hypertension   . Duodenal mass   . Baker's cyst of knee     Left, pt does not remember  . Edema   . SOB (shortness of breath)   . Unspecified hypothyroidism   . Grave's disease   . Hypothyroid   . Sleep apnea     off cpap  . Cerebral aneurysm rupture (HCC)     stent  . Radiation 2001    BREAST CA  . Malignant neoplasm of breast (female), unspecified site 2001    right, s/p lumpectomy and XRT  . 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 Breast cancer ([place]) 2001    RT LUMPECTOMY   Family History  Problem Relation Age of Onset  . Colon  cancer Sister   . Cancer Sister     colon  . Heart disease Father   . Esophageal cancer Neg Hx   . Rectal cancer Neg Hx   . Stomach cancer Neg Hx    Past Surgical History  Procedure Laterality Date  . Breast lumpectomy Right     right  . Partial nephrectomy      right  . Shoulder surgery      left  . Cataract extraction      bilateral  . Coronary stent placement      brain  . Femoral artery repair  2011  . Csf shunt  08/17/2008  . Hemiarthroplasty shoulder fracture      left  . Vaginal delivery      3  . Upper gastrointestinal endoscopy  11-10-2014  . Colonoscopy      last 2012.+ TA polyps  . Breast biopsy Left 03/14/2016    stereo   Social History   Social History  . Marital Status: Married    Spouse Name: N/A  . Number of Children: 3  . Years of Education: N/A   Occupational History  . retired    Social History Main Topics  . Smoking status: Former  Smoker -- 1.00 packs/day for 43 years    Types: Cigarettes    Start date: 04/01/1964    Quit date: 11/24/2007  . Smokeless tobacco: Never Used     Comment: quit smoking 7 years ago  . Alcohol Use: 0.6 oz/week    1 Glasses of wine per week     Comment: occasssionally  . Drug Use: No  . Sexual Activity: Not Currently   Other Topics Concern  . None   Social History Narrative   Lives in [place] with husband. From [place]. Children live [place] and [place].   Pets - 1 cat in home.      Work - retired Theme park manager, Educational psychologist   Diet - regular, limited calories          Review of Systems  Constitutional: Negative for fever, chills, appetite change, fatigue and unexpected weight change.  Eyes: Negative for visual disturbance.  Respiratory: Negative for cough and shortness of breath.   Cardiovascular: Negative for chest pain and leg swelling.  Gastrointestinal: Negative for nausea, vomiting, abdominal pain, diarrhea and constipation.  Musculoskeletal: Negative for myalgias and arthralgias.  Skin: Negative for color change and  rash.  Hematological: Negative for adenopathy. Does not bruise/bleed easily.  Psychiatric/Behavioral: Positive for sleep disturbance and dysphoric mood. The patient is nervous/anxious.        Objective:    BP 174/78 mmHg  Pulse 58  Temp(Src) 98.6 F (37 C) (Oral)  Ht 5\' 7"  (1.702 m)  Wt 198 lb 2 oz (89.869 kg)  BMI 31.02 kg/m2  SpO2 94% Physical Exam  Constitutional: She is oriented to person, place, and time. She appears well-developed and well-nourished. No distress.  HENT:  Head: Normocephalic and atraumatic.  Right Ear: External ear normal.  Left Ear: External ear normal.  Nose: Nose normal.  Mouth/Throat: Oropharynx is clear and moist. No oropharyngeal exudate.  Eyes: Conjunctivae are normal. Pupils are equal, round, and reactive to light. Right eye exhibits no discharge. Left eye exhibits no discharge. No scleral icterus.  Neck: Normal range of motion. Neck supple. No tracheal deviation present. No thyromegaly present.  Cardiovascular: Normal rate, regular rhythm, normal heart sounds and intact distal pulses.  Exam reveals no gallop and no friction rub.   No murmur heard. Pulmonary/Chest: Effort normal and breath sounds normal. No respiratory distress. She has no wheezes. She has no rales. She exhibits no tenderness.  Musculoskeletal: Normal range of motion. She exhibits no edema or tenderness.  Lymphadenopathy:    She has no cervical adenopathy.  Neurological: She is alert and oriented to person, place, and time. No cranial nerve deficit. She exhibits normal muscle tone. Coordination normal.  Skin: Skin is warm and dry. No rash noted. She is not diaphoretic. No erythema. No pallor.  Psychiatric: Her speech is normal and behavior is normal. Judgment and thought content normal. Her mood appears anxious. She exhibits a depressed mood. She expresses no suicidal ideation.          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 Scheduled for MRI breasts and then evaluation with Dr. Dalbert Batman in General Surgery. She has already discussed results with Dr. Marin Olp, her oncologist. Will check renal function today prior to MRI. Offered support today. Follow up 4 weeks and prn.      Relevant Orders   Comprehensive metabolic  panel       Return in about 4 weeks (around 04/16/2016) for Recheck.  Ronette Deter, MD Internal Medicine [facility]

## 2016-03-19 NOTE — Assessment & Plan Note (Signed)
Recent diagnosis of DCIS on left breast biopsy. Scheduled for MRI breasts and then evaluation with Dr. Dalbert Batman in General Surgery. She has already discussed results with Dr. Marin Olp, her oncologist. Will check renal function today prior to MRI. Offered support today. Follow up 4 weeks and prn.

## 2016-03-19 NOTE — Patient Instructions (Signed)
Labs today

## 2016-03-19 NOTE — Progress Notes (Signed)
Pre visit review using our clinic review tool, if applicable. No additional management support is needed unless otherwise documented below in the visit note. 

## 2016-03-22 NOTE — Progress Notes (Signed)
  Oncology Nurse Navigator Documentation  Navigator Location: [facility] (03/22/16 1400) Navigator Encounter Type: Other (Positive path received) (03/22/16 1400)   Abnormal Finding Date: 03/12/16 (03/22/16 1400) Confirmed Diagnosis Date: 03/14/16 (03/22/16 1400)         Barriers/Navigation Needs:  (Navigation) (03/22/16 1400)   Interventions:  (Notified Navigation in [place]) (03/22/16 1400)                      Time Spent with Patient: 30 (03/22/16 1400)   Received abnormal pathology .  Collecting info for MDBC.  Patient to follow-up with Dr. Dalbert Batman at [facility] surgery on 03/29/16.  She is currently followed by Dr. Marin Olp at [facility].  Notified [facility] Navigator that patient will be need navigation there.

## 2016-03-25 ENCOUNTER — Other Ambulatory Visit: Payer: Self-pay | Admitting: Hematology & Oncology

## 2016-03-25 ENCOUNTER — Ambulatory Visit (HOSPITAL_COMMUNITY)
Admission: RE | Admit: 2016-03-25 | Discharge: 2016-03-25 | Disposition: A | Discharge status: Home / Self Care | Payer: Medicare Other | Source: Ambulatory Visit | Attending: Hematology & Oncology | Admitting: Hematology & Oncology

## 2016-03-25 ENCOUNTER — Other Ambulatory Visit: Payer: Self-pay | Admitting: Cardiovascular Disease

## 2016-03-25 DIAGNOSIS — C50912 Malignant neoplasm of unspecified site of left female breast: Secondary | ICD-10-CM

## 2016-03-25 MED ORDER — GADOBENATE DIMEGLUMINE 529 MG/ML IV SOLN
9.0000 mL | Freq: Once | INTRAVENOUS | Status: AC | PRN
  Administered 2016-03-25: 9 mL via INTRAVENOUS

## 2016-03-29 ENCOUNTER — Telehealth: Payer: Self-pay

## 2016-03-29 ENCOUNTER — Other Ambulatory Visit: Payer: Self-pay | Admitting: General Surgery

## 2016-03-29 DIAGNOSIS — C50212 Malignant neoplasm of upper-inner quadrant of left female breast: Secondary | ICD-10-CM | POA: Diagnosis not present

## 2016-03-29 DIAGNOSIS — Z982 Presence of cerebrospinal fluid drainage device: Secondary | ICD-10-CM | POA: Diagnosis not present

## 2016-03-29 DIAGNOSIS — Z0000 Encounter for general adult medical examination without abnormal findings: Secondary | ICD-10-CM | POA: Diagnosis not present

## 2016-03-29 DIAGNOSIS — R011 Cardiac murmur, unspecified: Secondary | ICD-10-CM | POA: Diagnosis not present

## 2016-03-29 DIAGNOSIS — Z905 Acquired absence of kidney: Secondary | ICD-10-CM | POA: Diagnosis not present

## 2016-03-29 DIAGNOSIS — Z8639 Personal history of other endocrine, nutritional and metabolic disease: Secondary | ICD-10-CM | POA: Diagnosis not present

## 2016-03-29 DIAGNOSIS — Z853 Personal history of malignant neoplasm of breast: Secondary | ICD-10-CM | POA: Diagnosis not present

## 2016-03-29 DIAGNOSIS — Z9889 Other specified postprocedural states: Secondary | ICD-10-CM | POA: Diagnosis not present

## 2016-03-29 DIAGNOSIS — F329 Major depressive disorder, single episode, unspecified: Secondary | ICD-10-CM | POA: Diagnosis not present

## 2016-03-29 DIAGNOSIS — Z8679 Personal history of other diseases of the circulatory system: Secondary | ICD-10-CM | POA: Diagnosis not present

## 2016-03-29 DIAGNOSIS — I10 Essential (primary) hypertension: Secondary | ICD-10-CM | POA: Diagnosis not present

## 2016-03-29 DIAGNOSIS — N3946 Mixed incontinence: Secondary | ICD-10-CM | POA: Diagnosis not present

## 2016-03-29 DIAGNOSIS — R350 Frequency of micturition: Secondary | ICD-10-CM | POA: Diagnosis not present

## 2016-03-29 NOTE — Telephone Encounter (Signed)
Received cardiac clearance request for pt to proceed left breast lumpectomy w/ radioactive seed and sentinel node biopsy in the near future. Pt does not have a surgery date scheduled yet b/c she will require written clearance in order to get a surgery date.   She is interested in having surgery ASAP. [facility] is requesting letter be faxed to [phone], Attn: Yehuda Mao, RMA.  Letter and office notes placed on Dr. Donivan Scull desk for review.

## 2016-04-01 ENCOUNTER — Encounter: Payer: Self-pay | Admitting: *Deleted

## 2016-04-02 NOTE — Telephone Encounter (Signed)
Acceptable risk for surgery  no further testing needed Does not appear to have any medications to hold

## 2016-04-02 NOTE — Telephone Encounter (Signed)
Yehuda Mao, RMA from [facility] asking about below Please send back soon.  Pt is in need of surgery.

## 2016-04-02 NOTE — Telephone Encounter (Signed)
Dr. Donivan Scull recommendation routed to Yehuda Mao, [place] @ [phone].

## 2016-04-04 ENCOUNTER — Other Ambulatory Visit: Payer: Self-pay | Admitting: General Surgery

## 2016-04-04 DIAGNOSIS — C50212 Malignant neoplasm of upper-inner quadrant of left female breast: Secondary | ICD-10-CM

## 2016-04-05 ENCOUNTER — Other Ambulatory Visit: Payer: Self-pay | Admitting: Cardiovascular Disease

## 2016-04-08 ENCOUNTER — Other Ambulatory Visit (HOSPITAL_BASED_OUTPATIENT_CLINIC_OR_DEPARTMENT_OTHER): Payer: Medicare Other

## 2016-04-08 ENCOUNTER — Ambulatory Visit: Payer: Medicare Other

## 2016-04-08 ENCOUNTER — Encounter: Payer: Self-pay | Admitting: Hematology & Oncology

## 2016-04-08 ENCOUNTER — Ambulatory Visit (HOSPITAL_BASED_OUTPATIENT_CLINIC_OR_DEPARTMENT_OTHER): Payer: Medicare Other | Admitting: Hematology & Oncology

## 2016-04-08 VITALS — BP 158/68 | HR 51 | Temp 98.1°F | Resp 14 | Ht 67.0 in | Wt 195.0 lb

## 2016-04-08 DIAGNOSIS — Z853 Personal history of malignant neoplasm of breast: Secondary | ICD-10-CM

## 2016-04-08 DIAGNOSIS — D509 Iron deficiency anemia, unspecified: Secondary | ICD-10-CM

## 2016-04-08 DIAGNOSIS — Z23 Encounter for immunization: Secondary | ICD-10-CM

## 2016-04-08 DIAGNOSIS — C50012 Malignant neoplasm of nipple and areola, left female breast: Secondary | ICD-10-CM

## 2016-04-08 DIAGNOSIS — C50912 Malignant neoplasm of unspecified site of left female breast: Secondary | ICD-10-CM

## 2016-04-08 DIAGNOSIS — Z85528 Personal history of other malignant neoplasm of kidney: Secondary | ICD-10-CM

## 2016-04-08 DIAGNOSIS — C50011 Malignant neoplasm of nipple and areola, right female breast: Secondary | ICD-10-CM

## 2016-04-08 HISTORY — PX: BREAST EXCISIONAL BIOPSY: SUR124

## 2016-04-08 LAB — CBC WITH DIFFERENTIAL (CANCER CENTER ONLY)
BASO#: 0 10*3/uL (ref 0.0–0.2)
BASO%: 0.3 % (ref 0.0–2.0)
EOS%: 3.6 % (ref 0.0–7.0)
Eosinophils Absolute: 0.2 10*3/uL (ref 0.0–0.5)
HEMATOCRIT: 36.8 % (ref 34.8–46.6)
HGB: 12.5 g/dL (ref 11.6–15.9)
LYMPH#: 1.3 10*3/uL (ref 0.9–3.3)
LYMPH%: 21.6 % (ref 14.0–48.0)
MCH: 30.5 pg (ref 26.0–34.0)
MCHC: 34 g/dL (ref 32.0–36.0)
MCV: 90 fL (ref 81–101)
MONO#: 0.6 10*3/uL (ref 0.1–0.9)
MONO%: 8.9 % (ref 0.0–13.0)
NEUT#: 4 10*3/uL (ref 1.5–6.5)
NEUT%: 65.6 % (ref 39.6–80.0)
Platelets: 147 10*3/uL (ref 145–400)
RBC: 4.1 10*6/uL (ref 3.70–5.32)
RDW: 14 % (ref 11.1–15.7)
WBC: 6.2 10*3/uL (ref 3.9–10.0)

## 2016-04-08 LAB — CMP (CANCER CENTER ONLY)
ALBUMIN: 3.1 g/dL — AB (ref 3.3–5.5)
ALT(SGPT): 19 U/L (ref 10–47)
AST: 22 U/L (ref 11–38)
Alkaline Phosphatase: 91 U/L — ABNORMAL HIGH (ref 26–84)
BUN, Bld: 30 mg/dL — ABNORMAL HIGH (ref 7–22)
CALCIUM: 9 mg/dL (ref 8.0–10.3)
CHLORIDE: 107 meq/L (ref 98–108)
CO2: 26 meq/L (ref 18–33)
CREATININE: 1.4 mg/dL — AB (ref 0.6–1.2)
Glucose, Bld: 125 mg/dL — ABNORMAL HIGH (ref 73–118)
Potassium: 4.1 mEq/L (ref 3.3–4.7)
Sodium: 143 mEq/L (ref 128–145)
TOTAL PROTEIN: 6.4 g/dL (ref 6.4–8.1)
Total Bilirubin: 0.6 mg/dl (ref 0.20–1.60)

## 2016-04-08 NOTE — Progress Notes (Signed)
Hematology and Oncology Follow Up Visit  Cassidy Ortiz [id number] 1941/06/02 75 y.o. 04/08/2016   Principle Diagnosis:  1. 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 2. 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 3. Intermittent iron-deficiency anemia. 4.  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  Unfortunately, it now looks like she has a new breast cancer. She had a routine mammogram done couple months ago. This shows some suspicious calcifications in the left breast. She did not noted any changes in the left breast. There is no swelling, warmth, redness or pain. There is no swelling of the left arm.  She ultimately underwent a biopsy. This was done at [facility]. The pathology report  ([id number]) showed an invasive ductal carcinoma. It was 6.5 mm in size. Diagnostic markers showed the tumor to be ER positive and HER-2 negative.  She had a breast MRI done. This really did not show an actual mass in the left breast.  She is due for surgery next week. She is incredibly anxious as to the length of time is taken to get surgery. I told her that I would call her surgeon to see if he can move it up.  Otherwise, she is doing fairly well. She's had no cough. She is not smoking. She's had no nausea or vomiting. She's had no change in bowel or bladder habits. She's had no headache.  Overall, her performance status is ECOG 1.    Medications:  Current outpatient prescriptions:  .  ALPRAZolam (XANAX) 0.25 MG tablet, Take 1 tablet (0.25 mg total) by mouth 3 (three) times daily as needed for anxiety., Disp: 60 tablet, Rfl: 4 .  aspirin 81 MG tablet, Take 81 mg by mouth daily. Takes 2 tablets daily, Disp: , Rfl:  .  atorvastatin (LIPITOR) 40 MG tablet, Take 1 tablet (40 mg total) by mouth daily., Disp: 90 tablet, Rfl: 0 .  buPROPion (WELLBUTRIN SR) 100 MG  12 hr tablet, Take 100 mg by mouth daily., Disp: , Rfl: 2 .  carvedilol (COREG) 12.5 MG tablet, TAKE 1 TABLET TWICE A DAY  WITH MEALS., Disp: 180 tablet, Rfl: 3 .  cloNIDine (CATAPRES) 0.1 MG tablet, Take 1 tablet (0.1 mg total) by mouth 3 (three) times daily., Disp: 270 tablet, Rfl: 3 .  doxazosin (CARDURA) 8 MG tablet, Take 1 tablet (8 mg total) by mouth daily. (Patient taking differently: Take 4 mg by mouth daily. ), Disp: 90 tablet, Rfl: 3 .  Esomeprazole Magnesium (NEXIUM PO), Take 20 mg by mouth every morning. , Disp: , Rfl:  .  levothyroxine (SYNTHROID) 175 MCG tablet, Take 1 tablet (175 mcg total) by mouth daily., Disp: 90 tablet, Rfl: 3 .  lisinopril (PRINIVIL,ZESTRIL) 10 MG tablet, Take 10 mg by mouth daily., Disp: , Rfl:  .  metroNIDAZOLE (METROGEL) 1 % gel, Apply topically daily., Disp: 45 g, Rfl: 0 .  mirtazapine (REMERON) 15 MG tablet, Take 1 tablet (15 mg total) by mouth at bedtime., Disp: 30 tablet, Rfl: 3 .  mirtazapine (REMERON) 30 MG tablet, Take 30 mg by mouth at bedtime., Disp: , Rfl:  .  Multiple Vitamin (MULTIVITAMIN) tablet, Take 1 tablet by mouth daily.  , Disp: , Rfl:  .  nitroGLYCERIN (NITROSTAT) 0.4 MG SL tablet, Place 1 tablet (0.4 mg total) under the tongue every 5 (  five) minutes as needed for chest pain., Disp: 25 tablet, Rfl: 3 .  rOPINIRole (REQUIP) 1 MG tablet, TAKE 1 TABLET (1 MG TOTAL) BY MOUTH DAILY AS NEEDED., Disp: 30 tablet, Rfl: 1 .  Trospium Chloride 60 MG CP24, Take by mouth., Disp: , Rfl:  .  zolpidem (AMBIEN) 10 MG tablet, TAKE 1 AT BEDTIME AS NEEDED, Disp: , Rfl: 2 .  hydrALAZINE (APRESOLINE) 100 MG tablet, TAKE 1 TABLET 3 TIMES A DAY, Disp: 270 tablet, Rfl: 3 .  traZODone (DESYREL) 50 MG tablet, Take 50 mg by mouth at bedtime., Disp: , Rfl: 3 No current facility-administered medications for this visit.  Facility-Administered Medications Ordered in Other Visits:  .  0.9 %  sodium chloride infusion, , Intravenous, Continuous, Volanda Napoleon, MD,  Stopped at 04/20/15 1651  Allergies:  Allergies  Allergen Reactions  . Amlodipine     Leg swelling    Past Medical History, Surgical history, Social history, and Family History were reviewed and updated.  Review of Systems: As above  Physical Exam:  height is 5' 7" (1.702 m) and weight is 195 lb (88.451 kg). Her oral temperature is 98.1 F (36.7 C). Her blood pressure is 158/68 and her pulse is 51. Her respiration is 14.   Well-developed and well-nourished white female. Head and neck exam shows no ocular or oral lesions. She has no palpable cervical or supraclavicular lymph nodes. Lungs are clear. Cardiac exam regular in rhythm with no murmurs rubs or bruits. Abdomen is soft. She is good bowel sounds. There is no fluid wave. There is no guarding or rebound tenderness. There is no palpable liver or spleen tip.extremities shows the compression stockings bilaterally. She has minimal edema in her legs. No erythema is noted. She good range of motion and strength. Skin exam no rashes. Neurological exam is non-focal.breast exam shows left breast no masses edema or erythema. There is no left axillary adenopathy. Right breast shows contraction of the tissue. She has the nipple inversion which is chronic. She has some radiation changes. She has no obvious mass in the right breast. There is no right axillary adenopathy.   Lab Results  Component Value Date   WBC 6.2 04/08/2016   HGB 12.5 04/08/2016   HCT 36.8 04/08/2016   MCV 90 04/08/2016   PLT 147 04/08/2016     Chemistry      Component Value Date/Time   NA 143 04/08/2016 1413   NA 144 03/19/2016 1128   NA 147* 09/15/2015 1316   NA 144 04/03/2015 1427   NA 142 07/06/2012 1153   K 4.1 04/08/2016 1413   K 4.3 03/19/2016 1128   K 4.2 09/15/2015 1316   K 3.9 04/03/2015 1427   CL 107 04/08/2016 1413   CL 106 03/19/2016 1128   CL 111 04/03/2015 1427   CO2 26 04/08/2016 1413   CO2 31 03/19/2016 1128   CO2 27 09/15/2015 1316   CO2 24  04/03/2015 1427   BUN 30* 04/08/2016 1413   BUN 26* 03/19/2016 1128   BUN 28.6* 09/15/2015 1316   BUN 31* 04/03/2015 1427   BUN 24 07/06/2012 1153   CREATININE 1.4* 04/08/2016 1413   CREATININE 1.42* 03/19/2016 1128   CREATININE 1.5* 09/15/2015 1316   CREATININE 1.49* 04/03/2015 1427      Component Value Date/Time   CALCIUM 9.0 04/08/2016 1413   CALCIUM 9.2 03/19/2016 1128   CALCIUM 9.4 09/15/2015 1316   CALCIUM 9.4 04/03/2015 1427   ALKPHOS 91* 04/08/2016 1413  ALKPHOS 122* 03/19/2016 1128   ALKPHOS 88 09/15/2015 1316   AST 22 04/08/2016 1413   AST 14 03/19/2016 1128   AST 16 09/15/2015 1316   ALT 19 04/08/2016 1413   ALT 16 03/19/2016 1128   ALT 15 09/15/2015 1316   BILITOT 0.60 04/08/2016 1413   BILITOT 0.4 03/19/2016 1128   BILITOT 0.46 09/15/2015 1316         Impression and Plan: Ms. Loudermilk is 74-year-old female. 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 She has a past history of right renal cell carcinoma. Again I don't think these are a problem.   Thin problem now is the new left breast cancer. This looks like a wound stage I tumor. I would be surprised if it is anything more than stage I.  I reassure her that I do not believe that she was going to need any chemotherapy. I suppose that once we get the results back from the surgery, which I think will be a lumpectomy, and we sent off the Oncotype assay.  As far as her needing radiation, if she has a lumpectomy, I suspect that she probably will need a course of adjuvant radiation.  I believe that we can probably just get her on aromatase inhibitor therapy. I think this would be very appropriate for her.  I think the chance of her needing chemotherapy will be less than 10%.   I spent about 45 Ms. with she and her husband. Again, she was very worried about breast cancer. I tried to reassure her that I just do not feel that this was going to cause her any problems.   , R,  MD 5/1/20174:40 PM 

## 2016-04-09 LAB — FERRITIN: FERRITIN: 53 ng/mL (ref 9–269)

## 2016-04-09 LAB — IRON AND TIBC
%SAT: 26 % (ref 21–57)
Iron: 57 ug/dL (ref 41–142)
TIBC: 219 ug/dL — ABNORMAL LOW (ref 236–444)
UIBC: 162 ug/dL (ref 120–384)

## 2016-04-10 ENCOUNTER — Ambulatory Visit: Payer: Medicare Other | Admitting: Internal Medicine

## 2016-04-10 ENCOUNTER — Encounter (HOSPITAL_BASED_OUTPATIENT_CLINIC_OR_DEPARTMENT_OTHER): Payer: Self-pay | Admitting: *Deleted

## 2016-04-11 ENCOUNTER — Encounter (HOSPITAL_BASED_OUTPATIENT_CLINIC_OR_DEPARTMENT_OTHER)
Admission: RE | Admit: 2016-04-11 | Discharge: 2016-04-11 | Disposition: A | Discharge status: Home / Self Care | Payer: Medicare Other | Source: Ambulatory Visit | Attending: General Surgery | Admitting: General Surgery

## 2016-04-11 NOTE — Progress Notes (Signed)
Pt was given one 8 oz Boost Breeze to drink by 4 am morning of surgery. Instructed only the boost no other liquid after midnight except sip of water with meds morning of surgery

## 2016-04-16 ENCOUNTER — Encounter: Payer: Self-pay | Admitting: Internal Medicine

## 2016-04-16 ENCOUNTER — Ambulatory Visit (INDEPENDENT_AMBULATORY_CARE_PROVIDER_SITE_OTHER): Payer: Medicare Other | Admitting: Internal Medicine

## 2016-04-16 VITALS — BP 126/80 | HR 52 | Temp 97.9°F | Resp 14 | Ht 67.0 in | Wt 195.4 lb

## 2016-04-16 DIAGNOSIS — I10 Essential (primary) hypertension: Secondary | ICD-10-CM | POA: Diagnosis not present

## 2016-04-16 DIAGNOSIS — C50012 Malignant neoplasm of nipple and areola, left female breast: Secondary | ICD-10-CM | POA: Diagnosis not present

## 2016-04-16 NOTE — Patient Instructions (Signed)
Follow up after surgery

## 2016-04-16 NOTE — Assessment & Plan Note (Signed)
Scheduled for lumpectomy Friday. Reviewed notes from Oncology and General Surgery. Will plan to follow up here after surgery.

## 2016-04-16 NOTE — Assessment & Plan Note (Signed)
BP Readings from Last 3 Encounters:  04/16/16 126/80  04/08/16 158/68  03/19/16 174/78   BP well controlled today, however few elevated readings over last few weeks. Will monitor after upcoming surgery. Significant stress with recent breast cancer diagnosis likely contributing.

## 2016-04-16 NOTE — Progress Notes (Signed)
Subjective:    Patient ID: Cassidy Ortiz, female    DOB: Sep 28, 1941, 75 y.o.   MRN: [id number]  HPI  75YO female presents for follow up.  Scheduled for lumpectomy on Friday at the [place] at [facility]. Will stay over one night. Also seen by oncology after surgery. Stopped baby aspirin on Sunday. Notes that BP was slightly elevated at oncology visit earlier this week, however no CP, HA. Generally feeling well.   Wt Readings from Last 3 Encounters:  04/16/16 195 lb 6.4 oz (88.633 kg)  04/08/16 195 lb (88.451 kg)  04/10/16 195 lb (88.451 kg)   BP Readings from Last 3 Encounters:  04/16/16 126/80  04/08/16 158/68  03/19/16 174/78    Past Medical History  Diagnosis Date  . Vitamin D deficiency   . Mixed hyperlipidemia   . Anxiety state, unspecified   . Depressive disorder, not elsewhere classified   . Obstructive sleep apnea (adult) (pediatric)   . Hypertensive kidney disease, benign   . Cerebral aneurysm, nonruptured 2008    s/p coil and shunt, performed in [place]  . Duodenal ulcer   . Chronic kidney disease, stage III (moderate)   . Acquired cyst of kidney   . Abnormal weight gain   . Fatty liver   . Diverticulosis of colon (without mention of hemorrhage)   . Personal history of colonic polyps 10/22/2002    hyperplastic   . Hypertension   . Duodenal mass   . Baker's cyst of knee     Left, pt does not remember  . Edema   . Unspecified hypothyroidism   . Grave's disease   . Hypothyroid   . Sleep apnea     off cpap  . Cerebral aneurysm rupture (HCC)     stent  . Radiation 2001    BREAST CA  . Malignant neoplasm of breast (female), unspecified site 2001    right, s/p lumpectomy and XRT  . 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 Breast cancer ([place]) 2001    RT LUMPECTOMY   Family History  Problem Relation Age of Onset  . Colon cancer Sister   . Cancer Sister     colon  . Heart disease Father   . Esophageal cancer Neg Hx    . Rectal cancer Neg Hx   . Stomach cancer Neg Hx    Past Surgical History  Procedure Laterality Date  . Breast lumpectomy Right     right  . Partial nephrectomy      right  . Shoulder surgery      left  . Cataract extraction      bilateral  . Coronary stent placement      brain  . Femoral artery repair  2011  . Csf shunt  08/17/2008  . Hemiarthroplasty shoulder fracture      left  . Vaginal delivery      3  . Upper gastrointestinal endoscopy  11-10-2014  . Colonoscopy      last 2012.+ TA polyps  . Breast biopsy Left 03/14/2016    stereo   Social History   Social History  . Marital Status: Married    Spouse Name: N/A  . Number of Children: 3  . Years of Education: N/A   Occupational History  . retired    Social History Main Topics  . Smoking status: Former Smoker -- 1.00 packs/day for 43 years    Types: Cigarettes    Start  date: 04/01/1964    Quit date: 11/24/2007  . Smokeless tobacco: Never Used     Comment: quit smoking 7 years ago  . Alcohol Use: 0.6 oz/week    1 Glasses of wine per week     Comment: occasssionally  . Drug Use: No  . Sexual Activity: Not Currently   Other Topics Concern  . None   Social History Narrative   Lives in [place] with husband. From [place]. Children live [place] and [place].   Pets - 1 cat in home.      Work - retired Theme park manager, Educational psychologist   Diet - regular, limited calories          Review of Systems  Constitutional: Negative for fever, chills, appetite change, fatigue and unexpected weight change.  Eyes: Negative for visual disturbance.  Respiratory: Negative for shortness of breath.   Cardiovascular: Negative for chest pain and leg swelling.  Gastrointestinal: Negative for nausea, vomiting, abdominal pain, diarrhea and constipation.  Musculoskeletal: Negative for myalgias and arthralgias.  Skin: Negative for color change and rash.  Hematological: Negative for adenopathy. Does not bruise/bleed easily.  Psychiatric/Behavioral:  Negative for sleep disturbance and dysphoric mood. The patient is not nervous/anxious.        Objective:    BP 126/80 mmHg  Pulse 52  Temp(Src) 97.9 F (36.6 C) (Oral)  Resp 14  Ht 5\' 7"  (1.702 m)  Wt 195 lb 6.4 oz (88.633 kg)  BMI 30.60 kg/m2  SpO2 95% Physical Exam  Constitutional: She is oriented to person, place, and time. She appears well-developed and well-nourished. No distress.  HENT:  Head: Normocephalic and atraumatic.  Right Ear: External ear normal.  Left Ear: External ear normal.  Nose: Nose normal.  Mouth/Throat: Oropharynx is clear and moist. No oropharyngeal exudate.  Eyes: Conjunctivae are normal. Pupils are equal, round, and reactive to light. Right eye exhibits no discharge. Left eye exhibits no discharge. No scleral icterus.  Neck: Normal range of motion. Neck supple. No tracheal deviation present. No thyromegaly present.  Cardiovascular: Normal rate, regular rhythm, normal heart sounds and intact distal pulses.  Exam reveals no gallop and no friction rub.   No murmur heard. Pulmonary/Chest: Effort normal and breath sounds normal. No respiratory distress. She has no wheezes. She has no rales. She exhibits no tenderness.  Musculoskeletal: Normal range of motion. She exhibits no edema or tenderness.  Lymphadenopathy:    She has no cervical adenopathy.  Neurological: She is alert and oriented to person, place, and time. No cranial nerve deficit. She exhibits normal muscle tone. Coordination normal.  Skin: Skin is warm and dry. No rash noted. She is not diaphoretic. No erythema. No pallor.  Psychiatric: She has a normal mood and affect. Her behavior is normal. Judgment and thought content normal.          Assessment & Plan:  Over 7min of which >50% spent in face-to-face contact with patient discussing plan of care  Problem List Items Addressed This Visit      Unprioritized   Breast cancer ([place]) - Primary    Scheduled for lumpectomy Friday. Reviewed  notes from Oncology and General Surgery. Will plan to follow up here after surgery.      Hypertension    BP Readings from Last 3 Encounters:  04/16/16 126/80  04/08/16 158/68  03/19/16 174/78   BP well controlled today, however few elevated readings over last few weeks. Will monitor after upcoming surgery. Significant stress with recent breast cancer diagnosis likely contributing.  Return in about 4 weeks (around 05/14/2016) for Recheck.  Ronette Deter, MD Internal Medicine [facility]

## 2016-04-17 ENCOUNTER — Ambulatory Visit
Admission: RE | Admit: 2016-04-17 | Discharge: 2016-04-17 | Disposition: A | Discharge status: Home / Self Care | Payer: Medicare Other | Source: Ambulatory Visit | Attending: General Surgery | Admitting: General Surgery

## 2016-04-17 DIAGNOSIS — C50912 Malignant neoplasm of unspecified site of left female breast: Secondary | ICD-10-CM | POA: Diagnosis not present

## 2016-04-17 DIAGNOSIS — C50212 Malignant neoplasm of upper-inner quadrant of left female breast: Secondary | ICD-10-CM

## 2016-04-18 NOTE — H&P (Signed)
Cassidy Ortiz Location: [facility] Patient #: [id number] DOB: 07/27/1941 Married / Language: English / Race: White Female        History of Present Illness   The patient is a 75 year old female who presents with breast cancer. This is a pleasant 75 year old FEMALE from [facility], here with her husband. Referred by Dr. Burney Gauze and Dr. Ronette Deter, her PCP for evaluation of a new invasive cancer left breast, upper inner quadrant the radiologist and performed her left breast biopsy is Dr. Dorise Bullion. Her cardiologist is Dr. Sabino Dick in [place]  She has a significant past history of right breast lumpectomy, axillary node surgery, radiation therapy in 2001. This was done in [facility]. She does not remember the surgeon by Dr. Marin Olp took care of her then. There has been no recurrence on the right side, although she does have a little bit of milky discharge from the nipple, has nipple inversion and some deformity.  She gets annual mammograms. Recent mammogram show a 19 mm area of calcifications in the left breast upper inner quadrant. This is several centimeters away from the nipple. Image guided biopsy shows invasive mammary carcinoma and in situ cancer. Grade 2. E-cadherin stain was not done apparently. Estrogen and progesterone receptor positive, HER-2 negative. Subsequent MRI shows the known cancer in the upper inner quadrant of the left breast but is otherwise negative. No adenopathy. Solitary finding.  Comorbidities include the history of right breast cancer 2001 without recurrence. Graves' disease. Hypertension. Borderline systolic congestive heart failure. Heart murmur. Cerebral aneurysm in 2008 for which she underwent bur holes and transcatheter coiling and she did well. She has depression. She underwent a right partial nephrectomy for renal cell carcinoma in [place] done robotically with no known  recurrence.  Family history reveals a sister had vaginal cancer and colon cancer. There is no family history of breast cancer or ovarian cancer.  Social history reveals she is married. She has 3 sons. She has 3 sisters. None of them have had breast cancer. One sister had colon cancer  We had a long discussion. She is strongly motivated for lumpectomy. She is aware that mastectomy is an option but she is an excellent candidate for breast conservation surgery and that is what we're going to do. She will be scheduled for left breast lumpectomy with radioactive seed localization, left axillary sentinel node biopsy in the near future. I discussed the indications, details, techniques, and numerous risk of the surgery with her and her husband. She is aware the risks of bleeding, infection, reoperation for positive margins or positive nodes, cosmetic deformity, nipple deformity, nerve damage with chronic pain, shoulder disability, arm swelling. Arm numbness. She understands all these issues well. At this time all of her questions are answered. She agrees with this plan. She knows she will need radiation therapy and requested that be done in [place]. She knows that she will be offered antiestrogen therapy. Dr. Marin Olp will go over all that with her soon.  We will refer her to Dr. Marin Olp immediately. We will ask for cardiac clearance with Dr. Sabino Dick in [place]. She sees him frequently so hopefully this will be straightforward.   Other Problems  Anxiety Disorder Breast Cancer Cancer Depression Heart murmur High blood pressure Sleep Apnea Thyroid Disease  Past Surgical History Aneurysm Repair Breast Biopsy Bilateral. Breast Mass; Local Excision Right. Colon Polyp Removal - Colonoscopy Colon Polyp Removal - Open Shoulder Surgery Left.  Diagnostic Studies History  Colonoscopy 1-5 years ago Mammogram within last year Pap Smear 1-5 years  ago  Allergies  No Known Drug Allergies04/21/2017  Medication History  Atorvastatin Calcium (40MG Tablet, Oral) Active. Temazepam (15MG Capsule, Oral) Active. Zolpidem Tartrate (10MG Tablet, Oral) Active. BuPROPion HCl ER (SR) (100MG Tablet ER 12HR, Oral) Active. Carvedilol (12.5MG Tablet, Oral) Active. CloNIDine HCl (0.1MG Tablet, Oral) Active. Lisinopril (10MG Tablet, Oral) Active. Triamcinolone Acetonide (0.1% Cream, External) Active. TraZODone HCl (50MG Tablet, Oral) Active. Toviaz (4MG Tablet ER 24HR, Oral) Active. Synthroid (175MCG Tablet, Oral) Active. ROPINIRole HCl (1MG Tablet, Oral) Active. Restasis (0.05% Emulsion, Ophthalmic) Active. Mirtazapine (15MG Tablet, Oral) Active. Mirtazapine (30MG Tablet, Oral) Active. Doxazosin Mesylate (8MG Tablet, Oral) Active. HydrALAZINE HCl (100MG Tablet, Oral) Active. Fluorometholone (0.1% Suspension, Ophthalmic) Active. Medications Reconciled  Social History  Alcohol use Occasional alcohol use. Caffeine use Coffee. No drug use Tobacco use Former smoker.  Family History  Colon Cancer Sister.  Pregnancy / Birth History  Age at menarche 61 years. Age of menopause 31-60 Gravida 3 Irregular periods Maternal age 4-20 Para 3    Review of Systems ( General Not Present- Appetite Loss, Chills, Fatigue, Fever, Night Sweats, Weight Gain and Weight Loss. Skin Not Present- Change in Wart/Mole, Dryness, Hives, Jaundice, New Lesions, Non-Healing Wounds, Rash and Ulcer. HEENT Not Present- Earache, Hearing Loss, Hoarseness, Nose Bleed, Oral Ulcers, Ringing in the Ears, Seasonal Allergies, Sinus Pain, Sore Throat, Visual Disturbances, Wears glasses/contact lenses and Yellow Eyes. Respiratory Present- Snoring. Not Present- Bloody sputum, Chronic Cough, Difficulty Breathing and Wheezing. Breast Not Present- Breast Mass, Breast Pain, Nipple Discharge and Skin Changes. Cardiovascular Not Present- Chest Pain,  Difficulty Breathing Lying Down, Leg Cramps, Palpitations, Rapid Heart Rate, Shortness of Breath and Swelling of Extremities. Gastrointestinal Not Present- Abdominal Pain, Bloating, Bloody Stool, Change in Bowel Habits, Chronic diarrhea, Constipation, Difficulty Swallowing, Excessive gas, Gets full quickly at meals, Hemorrhoids, Indigestion, Nausea, Rectal Pain and Vomiting. Female Genitourinary Present- Frequency and Urgency. Not Present- Nocturia, Painful Urination and Pelvic Pain. Musculoskeletal Not Present- Back Pain, Joint Pain, Joint Stiffness, Muscle Pain, Muscle Weakness and Swelling of Extremities. Neurological Not Present- Decreased Memory, Fainting, Headaches, Numbness, Seizures, Tingling, Tremor, Trouble walking and Weakness. Psychiatric Present- Change in Sleep Pattern and Depression. Not Present- Anxiety, Bipolar, Fearful and Frequent crying. Endocrine Not Present- Cold Intolerance, Excessive Hunger, Hair Changes, Heat Intolerance, Hot flashes and New Diabetes. Hematology Not Present- Easy Bruising, Excessive bleeding, Gland problems, HIV and Persistent Infections.  Vitals   Weight: 194 lb Height: 66in Body Surface Area: 1.97 m Body Mass Index: 31.31 kg/m  Temp.: 98.62F(Temporal)  Pulse: 73 (Regular)  BP: 126/78 (Sitting, Left Arm, Standard)       Physical Exam  General Mental Status-Alert. General Appearance-Consistent with stated age. Hydration-Well hydrated. Voice-Normal.  Head and Neck Head-normocephalic, atraumatic with no lesions or palpable masses. Trachea-midline. Thyroid Gland Characteristics - normal size and consistency.  Eye Eyeball - Bilateral-Extraocular movements intact. Sclera/Conjunctiva - Bilateral-No scleral icterus.  Chest and Lung Exam Chest and lung exam reveals -quiet, even and easy respiratory effort with no use of accessory muscles and on auscultation, normal breath sounds, no adventitious sounds and  normal vocal resonance. Inspection Chest Wall - Normal. Back - normal. Note: Ventriculoperitoneal shunt catheter palpable coming down right neck, over right clavicle and onto right chest.   Breast Note: Left breast normal shape and contour. No ecchymoses or hematoma. No palpable mass. Nipple and areola complex normal. No axillary adenopathy. Right breast reveals transverse lumpectomy scar inferiorly laterally with nipple  retraction and some deformity and volume loss. I could not reproduce a nipple discharge. Right axillary incision. No mass in the right breast. No axillary adenopathy.   Cardiovascular Note: Regular rate and rhythm. No ectopy. Grade 3 systolic murmur. Good radial pulses.   Abdomen Inspection Inspection of the abdomen reveals - No Hernias. Palpation/Percussion Palpation and Percussion of the abdomen reveal - Soft, Non Tender, No Rebound tenderness, No Rigidity (guarding) and No hepatosplenomegaly. Auscultation Auscultation of the abdomen reveals - Bowel sounds normal. Note: Right transverse mid abdominal scar and other trocar sites from robotic nephrectomy   Neurologic Neurologic evaluation reveals -alert and oriented x 3 with no impairment of recent or remote memory. Mental Status-Normal.  Musculoskeletal Normal Exam - Left-Upper Extremity Strength Normal and Lower Extremity Strength Normal. Normal Exam - Right-Upper Extremity Strength Normal and Lower Extremity Strength Normal.  Lymphatic Head & Neck  General Head & Neck Lymphatics: Bilateral - Description - Normal. Axillary  General Axillary Region: Bilateral - Description - Normal. Tenderness - Non Tender. Femoral & Inguinal  Generalized Femoral & Inguinal Lymphatics: Bilateral - Description - Normal. Tenderness - Non Tender.    Assessment & Plan   BREAST CANCER OF UPPER-INNER QUADRANT OF LEFT FEMALE BREAST (C50.212)  Your recent imaging studies and biopsy showed an invasive mammary  carcinoma of the left breast, upper inner quadrant. This tumor is about 2 cm in diameter The MRI does not show any other abnormalities, and does not show any abnormal lymph nodes The tumor is estrogen receptor positive and HER-2 negative  We have discussed lumpectomy, mastectomy, sentinel node biopsy, radiation therapy, antiestrogen therapy It is your desire to proceed with left breast lumpectomy and sentinel node biopsy, and I think you are an excellent candidate for that. I do not think there is an advantage to mastectomy  You'll be schedule for left breast lumpectomy with radioactive with radioactive seed localization and left axillary sentinel node biopsy in the near future We have discussed the indications, techniques, and risks of this surgery in detail.  We will need cardiac clearance from your cardiologist in [place]. You may go ahead and make an appointment with Dr. Marin Olp. It might be helpful to see him prior to the surgery Because you have bilateral breast cancer, Dr. Marin Olp will consider whether genetic testing is important or not. If you desire, you may have your radiation therapy in [place] (Z85.3)  HISTORY OF CEREBRAL ANEURYSM REPAIR (Z98.890)  VP (VENTRICULOPERITONEAL) SHUNT STATUS (Z98.2)  HYPERTENSION, ESSENTIAL (I10)  HEART MURMUR (R01.1)  DEPRESSION, CONTROLLED (F32.9)  HISTORY OF PARTIAL NEPHRECTOMY (Z90.5) Impression: Right side. [place] state. Renal cell cancer. For cure  HISTORY OF GRAVES' DISEASE (Z86.39)   Edsel Petrin. Dalbert Batman, M.D., [facility] General and Minimally invasive Surgery Breast and Colorectal Surgery Office:   [phone] Pager:   [phone]

## 2016-04-19 ENCOUNTER — Ambulatory Visit (HOSPITAL_BASED_OUTPATIENT_CLINIC_OR_DEPARTMENT_OTHER)
Admission: RE | Admit: 2016-04-19 | Discharge: 2016-04-20 | Disposition: A | Discharge status: Home / Self Care | Payer: Medicare Other | Source: Ambulatory Visit | Attending: General Surgery | Admitting: General Surgery

## 2016-04-19 ENCOUNTER — Encounter (HOSPITAL_BASED_OUTPATIENT_CLINIC_OR_DEPARTMENT_OTHER): Payer: Self-pay

## 2016-04-19 ENCOUNTER — Ambulatory Visit (HOSPITAL_BASED_OUTPATIENT_CLINIC_OR_DEPARTMENT_OTHER): Payer: Medicare Other | Admitting: Anesthesiology

## 2016-04-19 ENCOUNTER — Encounter (HOSPITAL_BASED_OUTPATIENT_CLINIC_OR_DEPARTMENT_OTHER)
Admission: RE | Disposition: A | Discharge status: Home / Self Care | Payer: Self-pay | Source: Ambulatory Visit | Attending: General Surgery

## 2016-04-19 ENCOUNTER — Ambulatory Visit (HOSPITAL_COMMUNITY)
Admission: RE | Admit: 2016-04-19 | Discharge: 2016-04-19 | Disposition: A | Discharge status: Home / Self Care | Payer: Medicare Other | Source: Ambulatory Visit | Attending: General Surgery | Admitting: General Surgery

## 2016-04-19 ENCOUNTER — Ambulatory Visit
Admission: RE | Admit: 2016-04-19 | Discharge: 2016-04-19 | Disposition: A | Discharge status: Home / Self Care | Payer: Medicare Other | Source: Ambulatory Visit | Attending: General Surgery | Admitting: General Surgery

## 2016-04-19 DIAGNOSIS — I739 Peripheral vascular disease, unspecified: Secondary | ICD-10-CM | POA: Diagnosis not present

## 2016-04-19 DIAGNOSIS — I110 Hypertensive heart disease with heart failure: Secondary | ICD-10-CM | POA: Diagnosis not present

## 2016-04-19 DIAGNOSIS — Z8639 Personal history of other endocrine, nutritional and metabolic disease: Secondary | ICD-10-CM | POA: Diagnosis not present

## 2016-04-19 DIAGNOSIS — E079 Disorder of thyroid, unspecified: Secondary | ICD-10-CM | POA: Diagnosis not present

## 2016-04-19 DIAGNOSIS — C50212 Malignant neoplasm of upper-inner quadrant of left female breast: Secondary | ICD-10-CM | POA: Diagnosis not present

## 2016-04-19 DIAGNOSIS — K219 Gastro-esophageal reflux disease without esophagitis: Secondary | ICD-10-CM | POA: Diagnosis not present

## 2016-04-19 DIAGNOSIS — C50912 Malignant neoplasm of unspecified site of left female breast: Secondary | ICD-10-CM | POA: Diagnosis not present

## 2016-04-19 DIAGNOSIS — F329 Major depressive disorder, single episode, unspecified: Secondary | ICD-10-CM | POA: Insufficient documentation

## 2016-04-19 DIAGNOSIS — I5020 Unspecified systolic (congestive) heart failure: Secondary | ICD-10-CM | POA: Insufficient documentation

## 2016-04-19 DIAGNOSIS — Z905 Acquired absence of kidney: Secondary | ICD-10-CM | POA: Insufficient documentation

## 2016-04-19 DIAGNOSIS — Z170 Estrogen receptor positive status [ER+]: Secondary | ICD-10-CM | POA: Insufficient documentation

## 2016-04-19 DIAGNOSIS — Z87891 Personal history of nicotine dependence: Secondary | ICD-10-CM | POA: Diagnosis not present

## 2016-04-19 DIAGNOSIS — G8918 Other acute postprocedural pain: Secondary | ICD-10-CM | POA: Diagnosis not present

## 2016-04-19 DIAGNOSIS — Z85528 Personal history of other malignant neoplasm of kidney: Secondary | ICD-10-CM | POA: Insufficient documentation

## 2016-04-19 DIAGNOSIS — Z7982 Long term (current) use of aspirin: Secondary | ICD-10-CM | POA: Diagnosis not present

## 2016-04-19 DIAGNOSIS — Z853 Personal history of malignant neoplasm of breast: Secondary | ICD-10-CM | POA: Diagnosis not present

## 2016-04-19 DIAGNOSIS — G4730 Sleep apnea, unspecified: Secondary | ICD-10-CM | POA: Insufficient documentation

## 2016-04-19 DIAGNOSIS — F419 Anxiety disorder, unspecified: Secondary | ICD-10-CM | POA: Diagnosis not present

## 2016-04-19 DIAGNOSIS — R079 Chest pain, unspecified: Secondary | ICD-10-CM | POA: Diagnosis not present

## 2016-04-19 DIAGNOSIS — Z79899 Other long term (current) drug therapy: Secondary | ICD-10-CM | POA: Diagnosis not present

## 2016-04-19 DIAGNOSIS — R928 Other abnormal and inconclusive findings on diagnostic imaging of breast: Secondary | ICD-10-CM | POA: Diagnosis not present

## 2016-04-19 DIAGNOSIS — Z923 Personal history of irradiation: Secondary | ICD-10-CM | POA: Insufficient documentation

## 2016-04-19 DIAGNOSIS — Z982 Presence of cerebrospinal fluid drainage device: Secondary | ICD-10-CM | POA: Diagnosis not present

## 2016-04-19 HISTORY — DX: Malignant neoplasm of upper-inner quadrant of left female breast: C50.212

## 2016-04-19 HISTORY — PX: RADIOACTIVE SEED GUIDED PARTIAL MASTECTOMY WITH AXILLARY SENTINEL LYMPH NODE BIOPSY: SHX6520

## 2016-04-19 SURGERY — RADIOACTIVE SEED GUIDED PARTIAL MASTECTOMY WITH AXILLARY SENTINEL LYMPH NODE BIOPSY
Anesthesia: Regional | Site: Breast | Laterality: Left

## 2016-04-19 MED ORDER — ONDANSETRON HCL 4 MG/2ML IJ SOLN
INTRAMUSCULAR | Status: AC
  Filled 2016-04-19: qty 2

## 2016-04-19 MED ORDER — HYDROCODONE-ACETAMINOPHEN 5-325 MG PO TABS
1.0000 | ORAL_TABLET | ORAL | Status: DC | PRN
  Administered 2016-04-19 (×2): 2 via ORAL
  Administered 2016-04-20: 1 via ORAL
  Filled 2016-04-19 (×2): qty 1
  Filled 2016-04-19: qty 2
  Filled 2016-04-19: qty 1

## 2016-04-19 MED ORDER — LEVOTHYROXINE SODIUM 175 MCG PO TABS
175.0000 ug | ORAL_TABLET | Freq: Every day | ORAL | Status: DC
  Administered 2016-04-19: 175 ug via ORAL

## 2016-04-19 MED ORDER — CEFAZOLIN SODIUM-DEXTROSE 2-4 GM/100ML-% IV SOLN
2.0000 g | INTRAVENOUS | Status: AC
  Administered 2016-04-19: 2 g via INTRAVENOUS

## 2016-04-19 MED ORDER — LACTATED RINGERS IV SOLN: INTRAVENOUS | Status: DC

## 2016-04-19 MED ORDER — CLONIDINE HCL 0.1 MG PO TABS
0.1000 mg | ORAL_TABLET | Freq: Three times a day (TID) | ORAL | Status: DC
  Administered 2016-04-19 (×2): 0.1 mg via ORAL

## 2016-04-19 MED ORDER — METHYLENE BLUE 0.5 % INJ SOLN
INTRAVENOUS | Status: AC
  Filled 2016-04-19: qty 10

## 2016-04-19 MED ORDER — FENTANYL CITRATE (PF) 100 MCG/2ML IJ SOLN
INTRAMUSCULAR | Status: AC
  Filled 2016-04-19: qty 2

## 2016-04-19 MED ORDER — SODIUM CHLORIDE 0.9 % IJ SOLN
INTRAMUSCULAR | Status: AC
  Filled 2016-04-19: qty 10

## 2016-04-19 MED ORDER — DEXAMETHASONE SODIUM PHOSPHATE 10 MG/ML IJ SOLN
INTRAMUSCULAR | Status: AC
  Filled 2016-04-19: qty 1

## 2016-04-19 MED ORDER — GLYCOPYRROLATE 0.2 MG/ML IV SOSY
PREFILLED_SYRINGE | INTRAVENOUS | Status: AC
  Filled 2016-04-19: qty 3

## 2016-04-19 MED ORDER — NITROGLYCERIN 0.4 MG SL SUBL: 0.4000 mg | SUBLINGUAL_TABLET | SUBLINGUAL | Status: DC | PRN

## 2016-04-19 MED ORDER — SUCCINYLCHOLINE CHLORIDE 200 MG/10ML IV SOSY
PREFILLED_SYRINGE | INTRAVENOUS | Status: DC | PRN
  Administered 2016-04-19: 100 mg via INTRAVENOUS

## 2016-04-19 MED ORDER — FENTANYL CITRATE (PF) 100 MCG/2ML IJ SOLN
50.0000 ug | INTRAMUSCULAR | Status: DC | PRN
  Administered 2016-04-19 (×2): 50 ug via INTRAVENOUS

## 2016-04-19 MED ORDER — PHENYLEPHRINE 40 MCG/ML (10ML) SYRINGE FOR IV PUSH (FOR BLOOD PRESSURE SUPPORT)
PREFILLED_SYRINGE | INTRAVENOUS | Status: DC | PRN
  Administered 2016-04-19: 80 ug via INTRAVENOUS

## 2016-04-19 MED ORDER — CEFAZOLIN SODIUM-DEXTROSE 2-4 GM/100ML-% IV SOLN
INTRAVENOUS | Status: AC
  Filled 2016-04-19: qty 100

## 2016-04-19 MED ORDER — LISINOPRIL 10 MG PO TABS
10.0000 mg | ORAL_TABLET | Freq: Every day | ORAL | Status: DC
  Administered 2016-04-19: 10 mg via ORAL

## 2016-04-19 MED ORDER — SCOPOLAMINE 1 MG/3DAYS TD PT72: 1.0000 | MEDICATED_PATCH | Freq: Once | TRANSDERMAL | Status: DC | PRN

## 2016-04-19 MED ORDER — LIDOCAINE 2% (20 MG/ML) 5 ML SYRINGE
INTRAMUSCULAR | Status: AC
  Filled 2016-04-19: qty 5

## 2016-04-19 MED ORDER — ACETAMINOPHEN 325 MG PO TABS: 650.0000 mg | ORAL_TABLET | ORAL | Status: DC | PRN

## 2016-04-19 MED ORDER — GLYCOPYRROLATE 0.2 MG/ML IJ SOLN: 0.2000 mg | Freq: Once | INTRAMUSCULAR | Status: DC | PRN

## 2016-04-19 MED ORDER — ACETAMINOPHEN 10 MG/ML IV SOLN
INTRAVENOUS | Status: DC | PRN
  Administered 2016-04-19: 1000 mg via INTRAVENOUS

## 2016-04-19 MED ORDER — SODIUM CHLORIDE FLUSH 0.9 % IV SOLN
INTRAVENOUS | Status: AC
  Filled 2016-04-19: qty 10

## 2016-04-19 MED ORDER — FESOTERODINE FUMARATE ER 4 MG PO TB24: 4.0000 mg | ORAL_TABLET | Freq: Every day | ORAL | Status: DC

## 2016-04-19 MED ORDER — DOXAZOSIN MESYLATE 4 MG PO TABS
4.0000 mg | ORAL_TABLET | Freq: Every day | ORAL | Status: DC
  Administered 2016-04-19: 4 mg via ORAL

## 2016-04-19 MED ORDER — SODIUM CHLORIDE 0.9 % IV SOLN
250.0000 mL | INTRAVENOUS | Status: DC | PRN
  Administered 2016-04-19: 1000 mL via INTRAVENOUS

## 2016-04-19 MED ORDER — TRAZODONE HCL 50 MG PO TABS
50.0000 mg | ORAL_TABLET | Freq: Every day | ORAL | Status: DC
  Administered 2016-04-19: 50 mg via ORAL

## 2016-04-19 MED ORDER — MIDAZOLAM HCL 2 MG/2ML IJ SOLN
INTRAMUSCULAR | Status: AC
  Filled 2016-04-19: qty 2

## 2016-04-19 MED ORDER — SODIUM CHLORIDE 0.9% FLUSH: 3.0000 mL | INTRAVENOUS | Status: DC | PRN

## 2016-04-19 MED ORDER — ONDANSETRON HCL 4 MG/2ML IJ SOLN
INTRAMUSCULAR | Status: DC | PRN
  Administered 2016-04-19: 4 mg via INTRAVENOUS

## 2016-04-19 MED ORDER — CARVEDILOL 12.5 MG PO TABS
12.5000 mg | ORAL_TABLET | Freq: Two times a day (BID) | ORAL | Status: DC
  Administered 2016-04-19: 12.5 mg via ORAL

## 2016-04-19 MED ORDER — HYDROMORPHONE HCL 1 MG/ML IJ SOLN
0.2500 mg | INTRAMUSCULAR | Status: DC | PRN
  Administered 2016-04-19 (×2): 0.5 mg via INTRAVENOUS

## 2016-04-19 MED ORDER — OXYCODONE HCL 5 MG PO TABS: 5.0000 mg | ORAL_TABLET | Freq: Once | ORAL | Status: DC | PRN

## 2016-04-19 MED ORDER — ROPINIROLE HCL 1 MG PO TABS: 1.0000 mg | ORAL_TABLET | Freq: Every day | ORAL | Status: DC | PRN

## 2016-04-19 MED ORDER — GLYCOPYRROLATE 0.2 MG/ML IV SOSY
PREFILLED_SYRINGE | INTRAVENOUS | Status: DC | PRN
  Administered 2016-04-19 (×2): .2 mg via INTRAVENOUS

## 2016-04-19 MED ORDER — HYDROMORPHONE HCL 1 MG/ML IJ SOLN
0.5000 mg | INTRAMUSCULAR | Status: DC | PRN
  Administered 2016-04-19 (×2): 0.5 mg via INTRAVENOUS
  Filled 2016-04-19: qty 1

## 2016-04-19 MED ORDER — LIDOCAINE 2% (20 MG/ML) 5 ML SYRINGE
INTRAMUSCULAR | Status: DC | PRN
  Administered 2016-04-19: 60 mg via INTRAVENOUS

## 2016-04-19 MED ORDER — ONDANSETRON 4 MG PO TBDP: 4.0000 mg | ORAL_TABLET | Freq: Four times a day (QID) | ORAL | Status: DC | PRN

## 2016-04-19 MED ORDER — CHLORHEXIDINE GLUCONATE 4 % EX LIQD: 1.0000 "application " | Freq: Once | CUTANEOUS | Status: DC

## 2016-04-19 MED ORDER — HYDROMORPHONE HCL 1 MG/ML IJ SOLN
INTRAMUSCULAR | Status: AC
  Filled 2016-04-19: qty 1

## 2016-04-19 MED ORDER — MIRTAZAPINE 45 MG PO TABS
45.0000 mg | ORAL_TABLET | Freq: Every day | ORAL | Status: DC
  Administered 2016-04-19: 45 mg via ORAL

## 2016-04-19 MED ORDER — ATORVASTATIN CALCIUM 40 MG PO TABS
40.0000 mg | ORAL_TABLET | Freq: Every day | ORAL | Status: DC
  Administered 2016-04-19: 40 mg via ORAL

## 2016-04-19 MED ORDER — OXYCODONE HCL 5 MG/5ML PO SOLN: 5.0000 mg | Freq: Once | ORAL | Status: DC | PRN

## 2016-04-19 MED ORDER — BUPIVACAINE-EPINEPHRINE (PF) 0.5% -1:200000 IJ SOLN
INTRAMUSCULAR | Status: AC
  Filled 2016-04-19: qty 30

## 2016-04-19 MED ORDER — MIDAZOLAM HCL 2 MG/2ML IJ SOLN: 1.0000 mg | INTRAMUSCULAR | Status: DC | PRN

## 2016-04-19 MED ORDER — ACETAMINOPHEN 650 MG RE SUPP: 650.0000 mg | RECTAL | Status: DC | PRN

## 2016-04-19 MED ORDER — PROPOFOL 10 MG/ML IV BOLUS
INTRAVENOUS | Status: DC | PRN
  Administered 2016-04-19 (×2): 20 mg via INTRAVENOUS
  Administered 2016-04-19: 150 mg via INTRAVENOUS
  Administered 2016-04-19: 20 mg via INTRAVENOUS

## 2016-04-19 MED ORDER — ALPRAZOLAM 0.25 MG PO TABS: 0.2500 mg | ORAL_TABLET | Freq: Three times a day (TID) | ORAL | Status: DC | PRN

## 2016-04-19 MED ORDER — OXYCODONE HCL 5 MG PO TABS: 5.0000 mg | ORAL_TABLET | ORAL | Status: DC | PRN

## 2016-04-19 MED ORDER — PROPOFOL 10 MG/ML IV BOLUS
INTRAVENOUS | Status: AC
  Filled 2016-04-19: qty 20

## 2016-04-19 MED ORDER — ONDANSETRON HCL 4 MG/2ML IJ SOLN: 4.0000 mg | Freq: Four times a day (QID) | INTRAMUSCULAR | Status: DC | PRN

## 2016-04-19 MED ORDER — PANTOPRAZOLE SODIUM 40 MG PO TBEC: 40.0000 mg | DELAYED_RELEASE_TABLET | Freq: Every day | ORAL | Status: DC

## 2016-04-19 MED ORDER — PHENYLEPHRINE 40 MCG/ML (10ML) SYRINGE FOR IV PUSH (FOR BLOOD PRESSURE SUPPORT)
PREFILLED_SYRINGE | INTRAVENOUS | Status: AC
  Filled 2016-04-19: qty 10

## 2016-04-19 MED ORDER — TECHNETIUM TC 99M SULFUR COLLOID FILTERED
1.0000 | Freq: Once | INTRAVENOUS | Status: AC | PRN
  Administered 2016-04-19: 1 via INTRADERMAL

## 2016-04-19 MED ORDER — LACTATED RINGERS IV SOLN
INTRAVENOUS | Status: DC
  Administered 2016-04-19 (×2): via INTRAVENOUS

## 2016-04-19 MED ORDER — HYDRALAZINE HCL 100 MG PO TABS
100.0000 mg | ORAL_TABLET | Freq: Three times a day (TID) | ORAL | Status: DC
  Administered 2016-04-19 (×2): 100 mg via ORAL

## 2016-04-19 MED ORDER — HEPARIN SODIUM (PORCINE) 5000 UNIT/ML IJ SOLN: 5000.0000 [IU] | Freq: Three times a day (TID) | INTRAMUSCULAR | Status: DC

## 2016-04-19 MED ORDER — BUPIVACAINE-EPINEPHRINE (PF) 0.5% -1:200000 IJ SOLN
INTRAMUSCULAR | Status: DC | PRN
Start: 2016-04-19 — End: 2016-04-19
  Administered 2016-04-19: 25 mL via PERINEURAL

## 2016-04-19 MED ORDER — SODIUM CHLORIDE 0.9 % IJ SOLN
INTRAVENOUS | Status: DC | PRN
  Administered 2016-04-19: 5 mL via INTRAMUSCULAR

## 2016-04-19 MED ORDER — ACETAMINOPHEN 10 MG/ML IV SOLN
INTRAVENOUS | Status: AC
  Filled 2016-04-19: qty 100

## 2016-04-19 MED ORDER — SODIUM CHLORIDE 0.9% FLUSH
3.0000 mL | Freq: Two times a day (BID) | INTRAVENOUS | Status: DC
  Administered 2016-04-19: 3 mL via INTRAVENOUS

## 2016-04-19 SURGICAL SUPPLY — 65 items
ADH SKN CLS APL DERMABOND .7 (GAUZE/BANDAGES/DRESSINGS) ×1
APL SKNCLS STERI-STRIP NONHPOA (GAUZE/BANDAGES/DRESSINGS)
APPLIER CLIP 9.375 MED OPEN (MISCELLANEOUS) ×3
APR CLP MED 9.3 20 MLT OPN (MISCELLANEOUS) ×1
BENZOIN TINCTURE PRP APPL 2/3 (GAUZE/BANDAGES/DRESSINGS) IMPLANT
BINDER BREAST LRG (GAUZE/BANDAGES/DRESSINGS) ×2 IMPLANT
BINDER BREAST MEDIUM (GAUZE/BANDAGES/DRESSINGS) ×4 IMPLANT
BINDER BREAST XLRG (GAUZE/BANDAGES/DRESSINGS) IMPLANT
BINDER BREAST XXLRG (GAUZE/BANDAGES/DRESSINGS) IMPLANT
BLADE HEX COATED 2.75 (ELECTRODE) ×3 IMPLANT
BLADE SURG 10 STRL SS (BLADE) ×2 IMPLANT
BLADE SURG 15 STRL LF DISP TIS (BLADE) ×1 IMPLANT
BLADE SURG 15 STRL SS (BLADE) ×3
CANISTER SUC SOCK COL 7IN (MISCELLANEOUS) IMPLANT
CANISTER SUCT 1200ML W/VALVE (MISCELLANEOUS) ×3 IMPLANT
CHLORAPREP W/TINT 26ML (MISCELLANEOUS) ×3 IMPLANT
CLIP APPLIE 9.375 MED OPEN (MISCELLANEOUS) ×1 IMPLANT
CLOSURE WOUND 1/2 X4 (GAUZE/BANDAGES/DRESSINGS)
COVER BACK TABLE 60X90IN (DRAPES) ×3 IMPLANT
COVER MAYO STAND STRL (DRAPES) ×3 IMPLANT
COVER PROBE W GEL 5X96 (DRAPES) ×3 IMPLANT
DECANTER SPIKE VIAL GLASS SM (MISCELLANEOUS) ×2 IMPLANT
DERMABOND ADVANCED (GAUZE/BANDAGES/DRESSINGS) ×2
DERMABOND ADVANCED .7 DNX12 (GAUZE/BANDAGES/DRESSINGS) ×1 IMPLANT
DEVICE DUBIN W/COMP PLATE 8390 (MISCELLANEOUS) ×3 IMPLANT
DRAPE LAPAROSCOPIC ABDOMINAL (DRAPES) ×3 IMPLANT
DRAPE UTILITY XL STRL (DRAPES) ×3 IMPLANT
DRSG PAD ABDOMINAL 8X10 ST (GAUZE/BANDAGES/DRESSINGS) ×4 IMPLANT
ELECT REM PT RETURN 9FT ADLT (ELECTROSURGICAL) ×3
ELECTRODE REM PT RTRN 9FT ADLT (ELECTROSURGICAL) ×1 IMPLANT
GAUZE SPONGE 4X4 12PLY STRL (GAUZE/BANDAGES/DRESSINGS) ×2 IMPLANT
GLOVE BIO SURGEON STRL SZ7 (GLOVE) ×6 IMPLANT
GLOVE EUDERMIC 7 POWDERFREE (GLOVE) ×6 IMPLANT
GOWN STRL REUS W/ TWL LRG LVL3 (GOWN DISPOSABLE) ×2 IMPLANT
GOWN STRL REUS W/ TWL XL LVL3 (GOWN DISPOSABLE) ×1 IMPLANT
GOWN STRL REUS W/TWL LRG LVL3 (GOWN DISPOSABLE) ×6
GOWN STRL REUS W/TWL XL LVL3 (GOWN DISPOSABLE) ×3
ILLUMINATOR WAVEGUIDE N/F (MISCELLANEOUS) IMPLANT
KIT MARKER MARGIN INK (KITS) ×3 IMPLANT
LIGHT WAVEGUIDE WIDE FLAT (MISCELLANEOUS) IMPLANT
NDL HYPO 25X1 1.5 SAFETY (NEEDLE) ×2 IMPLANT
NDL SAFETY ECLIPSE 18X1.5 (NEEDLE) ×1 IMPLANT
NEEDLE HYPO 18GX1.5 SHARP (NEEDLE) ×3
NEEDLE HYPO 25X1 1.5 SAFETY (NEEDLE) ×6 IMPLANT
NS IRRIG 1000ML POUR BTL (IV SOLUTION) ×3 IMPLANT
PACK BASIN DAY SURGERY FS (CUSTOM PROCEDURE TRAY) ×3 IMPLANT
PENCIL BUTTON HOLSTER BLD 10FT (ELECTRODE) ×3 IMPLANT
SHEET MEDIUM DRAPE 40X70 STRL (DRAPES) ×2 IMPLANT
SLEEVE SCD COMPRESS KNEE MED (MISCELLANEOUS) ×3 IMPLANT
SPONGE LAP 18X18 X RAY DECT (DISPOSABLE) IMPLANT
SPONGE LAP 4X18 X RAY DECT (DISPOSABLE) ×5 IMPLANT
STRIP CLOSURE SKIN 1/2X4 (GAUZE/BANDAGES/DRESSINGS) IMPLANT
SUT MNCRL AB 4-0 PS2 18 (SUTURE) ×3 IMPLANT
SUT SILK 2 0 SH (SUTURE) ×3 IMPLANT
SUT VIC AB 2-0 CT1 27 (SUTURE)
SUT VIC AB 2-0 CT1 TAPERPNT 27 (SUTURE) IMPLANT
SUT VIC AB 3-0 SH 27 (SUTURE)
SUT VIC AB 3-0 SH 27X BRD (SUTURE) IMPLANT
SUT VICRYL 3-0 CR8 SH (SUTURE) ×3 IMPLANT
SYRINGE 10CC LL (SYRINGE) ×6 IMPLANT
TOWEL OR 17X24 6PK STRL BLUE (TOWEL DISPOSABLE) ×3 IMPLANT
TOWEL OR NON WOVEN STRL DISP B (DISPOSABLE) ×3 IMPLANT
TUBE CONNECTING 20'X1/4 (TUBING) ×1
TUBE CONNECTING 20X1/4 (TUBING) ×2 IMPLANT
YANKAUER SUCT BULB TIP NO VENT (SUCTIONS) ×3 IMPLANT

## 2016-04-19 NOTE — Transfer of Care (Signed)
Immediate Anesthesia Transfer of Care Note  Patient: Cassidy Ortiz  Procedure(s) Performed: Procedure(s): RADIOACTIVE SEED GUIDED PARTIAL MASTECTOMY WITH AXILLARY SENTINEL LYMPH NODE BIOPSY (Left)  Patient Location: PACU  Anesthesia Type:GA combined with regional for post-op pain  Level of Consciousness: sedated, patient cooperative and responds to stimulation  Airway & Oxygen Therapy: Patient Spontanous Breathing and Patient connected to face mask oxygen  Post-op Assessment: Report given to RN and Post -op Vital signs reviewed and stable  Post vital signs: Reviewed and stable  Last Vitals:  Filed Vitals:   04/19/16 0735 04/19/16 0740  BP:    Pulse: 64 58  Temp:    Resp: 17 20    Last Pain: There were no vitals filed for this visit.       Complications: No apparent anesthesia complications

## 2016-04-19 NOTE — Anesthesia Procedure Notes (Addendum)
Anesthesia Regional Block:  Pectoralis block  Pre-Anesthetic Checklist: ,, timeout performed, Correct Patient, Correct Site, Correct Laterality, Correct Procedure, Correct Position, site marked, Risks and benefits discussed,  Surgical consent,  Pre-op evaluation,  At surgeon's request and post-op pain management  Laterality: Left  Prep: chloraprep       Needles:  Injection technique: Single-shot  Needle Type: Echogenic Needle     Needle Length: 9cm 9 cm Needle Gauge: 21 and 21 G    Additional Needles:  Procedures: ultrasound guided (picture in chart) Pectoralis block Narrative:  Start time: 04/19/2016 7:14 AM End time: 04/19/2016 7:22 AM Injection made incrementally with aspirations every 5 mL.  Performed by: Personally  Anesthesiologist: HODIERNE, ADAM  Additional Notes: Pt tolerated the procedure well.   Procedure Name: Intubation Performed by: Lyndee Leo Pre-anesthesia Checklist: Patient identified, Emergency Drugs available, Suction available and Patient being monitored Patient Re-evaluated:Patient Re-evaluated prior to inductionOxygen Delivery Method: Circle System Utilized Preoxygenation: Pre-oxygenation with 100% oxygen Intubation Type: IV induction Ventilation: Mask ventilation without difficulty and Oral airway inserted - appropriate to patient size Laryngoscope Size: Mac and 3 Grade View: Grade II Tube type: Oral Tube size: 7.0 mm Number of attempts: 1 Airway Equipment and Method: Stylet and Oral airway Placement Confirmation: ETT inserted through vocal cords under direct vision,  positive ETCO2 and breath sounds checked- equal and bilateral Secured at: 20 cm Tube secured with: Tape Dental Injury: Teeth and Oropharynx as per pre-operative assessment

## 2016-04-19 NOTE — Progress Notes (Signed)
Assisted Dr. Marcie Bal with left, ultrasound guided, pectoralis block. Side rails up, monitors on throughout procedure. See vital signs in flow sheet. Tolerated Procedure well.

## 2016-04-19 NOTE — Op Note (Signed)
Patient Name:           Cassidy Ortiz   Date of Surgery:        04/19/2016  Pre op Diagnosis:      Invasive duct carcinoma left breast, upper inner quadrant.  Clinical stage IA, estrogen receptor positive, HER-2 negative  Post op Diagnosis:    Same  Procedure:                 Inject blue dye left breast, left partial mastectomy with radioactive seed localization and margin assessment, left axillary sentinel node biopsy  Surgeon:                      M. , M.D., FACS  Assistant:                      OR staff  Operative Indications:    This is a pleasant 75-year-old FEMALE from [name], here with her husband. Referred by Dr. Peter Ennever and Dr. Jennifer Walker, her PCP for evaluation of a new invasive cancer left breast, upper inner quadrant . Her cardiologist is Dr. Timothy Gollam in [place]    She has a significant past history of right breast lumpectomy, axillary node surgery, radiation therapy in 2001. This was done in [place]. She does not remember the surgeon but  Dr. Ennever took care of her then. There has been no recurrence on the right side, although she does have a little bit of milky discharge from the nipple, has nipple inversion and some deformity.     She gets annual mammograms. Recent mammogram show a 19 mm area of calcifications in the left breast upper inner quadrant. This is several centimeters away from the nipple. Image guided biopsy shows invasive mammary carcinoma and in situ cancer. Grade 2. E-cadherin stain was not done apparently. Estrogen and progesterone receptor positive, HER-2 negative. Subsequent MRI shows the known cancer in the upper inner quadrant of the left breast but is otherwise negative. No adenopathy. Solitary finding.       Comorbidities include the history of right breast cancer 2001 without recurrence. Graves' disease. Hypertension. Sleep apnea but no devices used. Borderline systolic congestive heart  failure. Heart murmur. Cerebral aneurysm in 2008 for which she underwent bur holes and transcatheter coiling and she did well. She has depression. She underwent a right partial nephrectomy for renal cell carcinoma in [place] done robotically with no known recurrence.    We had a long discussion. She is strongly motivated for lumpectomy. She is aware that mastectomy is an option but she is an excellent candidate for breast conservation surgery and that is what we're going to do. She will be scheduled for left breast lumpectomy with radioactive seed localization, left axillary sentinel node biopsy in the near future.  Operative Findings:       The lumpectomy went well and was performed through a curvilinear circumareolar incision high in the upper inner quadrant overlying the tumor.  The specimen mammogram looked excellent with the marker clip and radioactive seed in the exact center of the specimen.  In the axilla I found one obvious sentinel lymph node and one small area of blue radioactivity that may be a small node or small lymphatic and that was all that was found.  Procedure in Detail:          The patient underwent left pectoral block by the anesthesiologist preop.  The patient underwent injection   of technetium 99 radionuclide by the nuclear medicine technician in the holding area.  I was able to hear the audible signal from the radioactive seed using the neoprobe in the holding area.    The patient was taken to the operating room.  She underwent general anesthesia with LMA device.  Intravenous antibiotics were given.  Surgical timeout was performed.  Following alcohol prep I injected 5 mL of blue dye in the left breast, subareolar area.  This is methylene blue mixed with saline and the breast was massaged for a few minutes.      The left breast and axilla were prepped and draped in a sterile fashion.  0.5% Marcaine with epinephrine was used as a local infiltration anesthetic.  Using the  neoprobe I identified the area of maximum radioactivity in the upper inner left breast.  This was about 8 or 10 cm away from the areolar margin.  I made a circumareolar incision in this location and using the neoprobe frequently I dissected down into the breast tissue and widely around the radioactivity.  Specimen was removed and marked with silk sutures and a 6 color ink kit to orient the pathologist.  The specimen mammogram looked very good as described above.  The specimen was marked and sent to the lab.  This wound was irrigated with saline.  Hemostasis excellent and achieved with  electrocautery.  Metal marker clips were placed at the 5 cardinal positions of the lumpectomy cavity.  The breast tissues were closed in 3 layers, 2 layers with interrupted 3-0 Vicryl and the skin with running subcutaneous 4-0 Monocryl and Dermabond.    Attention was then directed to the left axilla.  A linear incision was made at the hairline in a skin crease.  Dissection was carried down through the subcutaneous tissue.  The clavipectoral fascia was incised.  I found one obvious hot and blue sentinel node.  I found one small area of blue dye with some radioactivity in and sent that although it was very small.  After this was done there was no other blue lymphatics and no other radioactivity.  Hemostasis excellent and the axilla.  Wound irrigated with saline.  Deeper tissues closed with 3-0 Vicryl sutures and skin closed with a running subcutaneous take her 4-0 Monocryl and Dermabond.  Dry bandages and a breast binder were placed.  Patient tolerated the procedure well was taken to PACU in stable condition.  It should be mentioned she had nausea and vomiting in the holding area after  getting some fentanyl.  We are going to keep her overnight because of her sleep apnea.  Estimated blood loss 30 mL.  Counts correct.  Complications none.     Edsel Petrin. Dalbert Batman, M.D., FACS General and Minimally Invasive Surgery Breast and  Colorectal Surgery  04/19/2016 9:07 AM

## 2016-04-19 NOTE — Anesthesia Preprocedure Evaluation (Signed)
Anesthesia Evaluation  Patient identified by MRN, date of birth, ID band Patient awake    Reviewed: Allergy & Precautions, NPO status , Patient's Chart, lab work & pertinent test results  Airway Mallampati: II   Neck ROM: full    Dental   Pulmonary sleep apnea , former smoker,    breath sounds clear to auscultation       Cardiovascular hypertension, + Peripheral Vascular Disease   Rhythm:regular Rate:Normal     Neuro/Psych  Headaches, PSYCHIATRIC DISORDERS Anxiety Depression CVA    GI/Hepatic PUD, GERD  ,  Endo/Other  Hypothyroidism Hyperthyroidism   Renal/GU Renal InsufficiencyRenal disease     Musculoskeletal   Abdominal   Peds  Hematology   Anesthesia Other Findings   Reproductive/Obstetrics                             Anesthesia Physical Anesthesia Plan  ASA: III  Anesthesia Plan: General and Regional   Post-op Pain Management:  Regional for Post-op pain   Induction: Intravenous  Airway Management Planned: LMA  Additional Equipment:   Intra-op Plan:   Post-operative Plan:   Informed Consent: I have reviewed the patients History and Physical, chart, labs and discussed the procedure including the risks, benefits and alternatives for the proposed anesthesia with the patient or authorized representative who has indicated his/her understanding and acceptance.     Plan Discussed with: CRNA, Anesthesiologist and Surgeon  Anesthesia Plan Comments:         Anesthesia Quick Evaluation

## 2016-04-19 NOTE — Discharge Instructions (Signed)
Post Anesthesia Home Care Instructions  Activity: Get plenty of rest for the remainder of the day. A responsible adult should stay with you for 24 hours following the procedure.  For the next 24 hours, DO NOT: -Drive a car -Paediatric nurse -Drink alcoholic beverages -Take any medication unless instructed by your physician -Make any legal decisions or sign important papers.  Meals: Start with liquid foods such as gelatin or soup. Progress to regular foods as tolerated. Avoid greasy, spicy, heavy foods. If nausea and/or vomiting occur, drink only clear liquids until the nausea and/or vomiting subsides. Call your physician if vomiting continues.  Special Instructions/Symptoms: Your throat may feel dry or sore from the anesthesia or the breathing tube placed in your throat during surgery. If this causes discomfort, gargle with warm salt water. The discomfort should disappear within 24 hours.  If you had a scopolamine patch placed behind your ear for the management of post- operative nausea and/or vomiting:  1. The medication in the patch is effective for 72 hours, after which it should be removed.  Wrap patch in a tissue and discard in the trash. Wash hands thoroughly with soap and water. 2. You may remove the patch earlier than 72 hours if you experience unpleasant side effects which may include dry mouth, dizziness or visual disturbances. 3. Avoid touching the patch. Wash your hands with soap and water after contact with the patch.           [place] [facility] Phone Number [phone]  BREAST BIOPSY/ PARTIAL MASTECTOMY: POST OP INSTRUCTIONS  Always review your discharge instruction sheet given to you by the facility where your surgery was performed.  IF YOU HAVE DISABILITY OR FAMILY LEAVE FORMS, YOU MUST BRING THEM TO THE OFFICE FOR PROCESSING.  DO NOT GIVE THEM TO YOUR DOCTOR.  1. A prescription for pain medication may be given to you upon discharge.  Take  your pain medication as prescribed, if needed.  If narcotic pain medicine is not needed, then you may take acetaminophen (Tylenol) or ibuprofen (Advil) as needed. 2. Take your usually prescribed medications unless otherwise directed 3. If you need a refill on your pain medication, please contact your pharmacy.  They will contact our office to request authorization.  Prescriptions will not be filled after 5pm or on week-ends. 4. You should eat very light the first 24 hours after surgery, such as soup, crackers, pudding, etc.  Resume your normal diet the day after surgery. 5. Most patients will experience some swelling and bruising in the breast.  Ice packs and a good support bra will help.  Swelling and bruising can take several days to resolve.  6. It is common to experience some constipation if taking pain medication after surgery.  Increasing fluid intake and taking a stool softener will usually help or prevent this problem from occurring.  A mild laxative (Milk of Magnesia or Miralax) should be taken according to package directions if there are no bowel movements after 48 hours. 7. Unless discharge instructions indicate otherwise, you may remove your bandages 24-48 hours after surgery, and you may shower at that time.  You may have steri-strips (small skin tapes) in place directly over the incision.  These strips should be left on the skin for 7-10 days.  If your surgeon used skin glue on the incision, you may shower in 24 hours.  The glue will flake off over the next 2-3 weeks.  Any sutures or staples will be removed at the office  during your follow-up visit. 8. ACTIVITIES:  You may resume regular daily activities (gradually increasing) beginning the next day.  Wearing a good support bra or sports bra minimizes pain and swelling.  You may have sexual intercourse when it is comfortable. a. You may drive when you no longer are taking prescription pain medication, you can comfortably wear a seatbelt, and  you can safely maneuver your car and apply brakes. b. RETURN TO WORK:  ______________________________________________________________________________________ 9. You should see your doctor in the office for a follow-up appointment approximately two weeks after your surgery.  Your doctors nurse will typically make your follow-up appointment when she calls you with your pathology report.  Expect your pathology report 2-3 business days after your surgery.  You may call to check if you do not hear from [place] after three days. 10. OTHER INSTRUCTIONS: _______________________________________________________________________________________________ _____________________________________________________________________________________________________________________________________ _____________________________________________________________________________________________________________________________________ _____________________________________________________________________________________________________________________________________  WHEN TO CALL YOUR DOCTOR: 1. Fever over 101.0 2. Nausea and/or vomiting. 3. Extreme swelling or bruising. 4. Continued bleeding from incision. 5. Increased pain, redness, or drainage from the incision.  The clinic staff is available to answer your questions during regular business hours.  Please dont hesitate to call and ask to speak to one of the nurses for clinical concerns.  If you have a medical emergency, go to the nearest emergency room or call 911.  A surgeon from [facility] is always on call at the hospital.  For further questions, please visit [email or web address]     Post Anesthesia Home Care Instructions  Activity: Get plenty of rest for the remainder of the day. A responsible adult should stay with you for 24 hours following the procedure.  For the next 24 hours, DO NOT: -Drive a car -Paediatric nurse -Drink alcoholic  beverages -Take any medication unless instructed by your physician -Make any legal decisions or sign important papers.  Meals: Start with liquid foods such as gelatin or soup. Progress to regular foods as tolerated. Avoid greasy, spicy, heavy foods. If nausea and/or vomiting occur, drink only clear liquids until the nausea and/or vomiting subsides. Call your physician if vomiting continues.  Special Instructions/Symptoms: Your throat may feel dry or sore from the anesthesia or the breathing tube placed in your throat during surgery. If this causes discomfort, gargle with warm salt water. The discomfort should disappear within 24 hours.  If you had a scopolamine patch placed behind your ear for the management of post- operative nausea and/or vomiting:  1. The medication in the patch is effective for 72 hours, after which it should be removed.  Wrap patch in a tissue and discard in the trash. Wash hands thoroughly with soap and water. 2. You may remove the patch earlier than 72 hours if you experience unpleasant side effects which may include dry mouth, dizziness or visual disturbances. 3. Avoid touching the patch. Wash your hands with soap and water after contact with the patch.

## 2016-04-19 NOTE — Anesthesia Postprocedure Evaluation (Signed)
Anesthesia Post Note  Patient: Cassidy Ortiz  Procedure(s) Performed: Procedure(s) (LRB): RADIOACTIVE SEED GUIDED PARTIAL MASTECTOMY WITH AXILLARY SENTINEL LYMPH NODE BIOPSY (Left)  Patient location during evaluation: PACU Anesthesia Type: General Level of consciousness: awake and alert and patient cooperative Pain management: pain level controlled Vital Signs Assessment: post-procedure vital signs reviewed and stable Respiratory status: spontaneous breathing and respiratory function stable Cardiovascular status: stable Anesthetic complications: no    Last Vitals:  Filed Vitals:   04/19/16 1000 04/19/16 1015  BP: 187/82 183/104  Pulse: 68 68  Temp:    Resp: 14 11    Last Pain:  Filed Vitals:   04/19/16 1020  PainSc: [place]

## 2016-04-19 NOTE — Interval H&P Note (Signed)
History and Physical Interval Note:  04/19/2016 7:08 AM  Cassidy Ortiz  has presented today for surgery, with the diagnosis of LEFT BREAST CANCER  The various methods of treatment have been discussed with the patient and family. After consideration of risks, benefits and other options for treatment, the patient has consented to  Procedure(s): RADIOACTIVE SEED GUIDED PARTIAL MASTECTOMY WITH AXILLARY SENTINEL LYMPH NODE BIOPSY (Left) as a surgical intervention .  The patient's history has been reviewed, patient examined, no change in status, stable for surgery.  I have reviewed the patient's chart and labs.  Questions were answered to the patient's satisfaction.     Adin Hector

## 2016-04-20 DIAGNOSIS — F329 Major depressive disorder, single episode, unspecified: Secondary | ICD-10-CM | POA: Diagnosis not present

## 2016-04-20 DIAGNOSIS — Z170 Estrogen receptor positive status [ER+]: Secondary | ICD-10-CM | POA: Diagnosis not present

## 2016-04-20 DIAGNOSIS — G4730 Sleep apnea, unspecified: Secondary | ICD-10-CM | POA: Diagnosis not present

## 2016-04-20 DIAGNOSIS — I5020 Unspecified systolic (congestive) heart failure: Secondary | ICD-10-CM | POA: Diagnosis not present

## 2016-04-20 DIAGNOSIS — C50212 Malignant neoplasm of upper-inner quadrant of left female breast: Secondary | ICD-10-CM | POA: Diagnosis not present

## 2016-04-20 DIAGNOSIS — I110 Hypertensive heart disease with heart failure: Secondary | ICD-10-CM | POA: Diagnosis not present

## 2016-04-22 ENCOUNTER — Encounter (HOSPITAL_BASED_OUTPATIENT_CLINIC_OR_DEPARTMENT_OTHER): Payer: Self-pay | Admitting: General Surgery

## 2016-04-24 NOTE — Progress Notes (Signed)
Quick Note:  Inform patient of Pathology report,.left breast lumpectomy shows 1.2 cm breast cancer. Margins are negative. Lymph nodes are negative. She will not need any further surgery. This is good news. I will discuss this with her in detail at her next office visit.  hmi ______

## 2016-04-29 ENCOUNTER — Other Ambulatory Visit: Payer: Self-pay

## 2016-04-29 MED ORDER — ROPINIROLE HCL 1 MG PO TABS: ORAL_TABLET | ORAL | Status: DC

## 2016-05-03 ENCOUNTER — Telehealth: Payer: Self-pay | Admitting: *Deleted

## 2016-05-03 NOTE — Telephone Encounter (Signed)
Can you assist in what note we can send that she needs a sleep study. Last visit on 5/9 and a few visits prior all have in the past medical hx that she has sleep issues, no cpap.  Please advise?

## 2016-05-03 NOTE — Telephone Encounter (Signed)
Sleep med has requested to have any progress notes in reference to patients cpap, this year. Fax [phone] Phone : Vicente Males [phone]

## 2016-05-05 NOTE — Telephone Encounter (Signed)
We probably have to make a follow up visit specifically for sleep issues to document

## 2016-05-07 ENCOUNTER — Other Ambulatory Visit: Payer: Self-pay | Admitting: Internal Medicine

## 2016-05-07 ENCOUNTER — Other Ambulatory Visit: Payer: Self-pay | Admitting: Cardiovascular Disease

## 2016-05-07 NOTE — Telephone Encounter (Signed)
Can we schedule this patient for a 30 min OV for discussion on Sleep issues so that we can get the proper documentation for her to get equipment.  Please call and schedule, thanks

## 2016-05-07 NOTE — Telephone Encounter (Signed)
Okay; thanks.

## 2016-05-07 NOTE — Telephone Encounter (Signed)
Ok. I called pt and spoke with pt son and he stated that pt was diagnosed with Breast Cancer and he wants to wait for her to come in. Son stated reason being that she is waiting for a appt to see the oncologist. Son said he will call to sch appt later on in the months.

## 2016-05-13 ENCOUNTER — Encounter (HOSPITAL_COMMUNITY): Payer: Self-pay

## 2016-05-13 DIAGNOSIS — C50919 Malignant neoplasm of unspecified site of unspecified female breast: Secondary | ICD-10-CM | POA: Diagnosis not present

## 2016-05-14 ENCOUNTER — Encounter: Payer: Self-pay | Admitting: *Deleted

## 2016-05-14 DIAGNOSIS — G4700 Insomnia, unspecified: Secondary | ICD-10-CM | POA: Diagnosis not present

## 2016-05-14 DIAGNOSIS — F332 Major depressive disorder, recurrent severe without psychotic features: Secondary | ICD-10-CM | POA: Diagnosis not present

## 2016-05-16 ENCOUNTER — Other Ambulatory Visit (HOSPITAL_BASED_OUTPATIENT_CLINIC_OR_DEPARTMENT_OTHER): Payer: Medicare Other

## 2016-05-16 ENCOUNTER — Ambulatory Visit (HOSPITAL_BASED_OUTPATIENT_CLINIC_OR_DEPARTMENT_OTHER): Payer: Medicare Other | Admitting: Hematology & Oncology

## 2016-05-16 ENCOUNTER — Encounter: Payer: Self-pay | Admitting: Hematology & Oncology

## 2016-05-16 VITALS — BP 155/64 | HR 63 | Temp 98.2°F | Resp 18 | Ht 67.0 in | Wt 194.0 lb

## 2016-05-16 DIAGNOSIS — I427 Cardiomyopathy due to drug and external agent: Secondary | ICD-10-CM

## 2016-05-16 DIAGNOSIS — Z85528 Personal history of other malignant neoplasm of kidney: Secondary | ICD-10-CM

## 2016-05-16 DIAGNOSIS — C50912 Malignant neoplasm of unspecified site of left female breast: Secondary | ICD-10-CM | POA: Diagnosis not present

## 2016-05-16 DIAGNOSIS — C50012 Malignant neoplasm of nipple and areola, left female breast: Secondary | ICD-10-CM

## 2016-05-16 DIAGNOSIS — Z853 Personal history of malignant neoplasm of breast: Secondary | ICD-10-CM

## 2016-05-16 DIAGNOSIS — C50212 Malignant neoplasm of upper-inner quadrant of left female breast: Secondary | ICD-10-CM

## 2016-05-16 DIAGNOSIS — T451X5A Adverse effect of antineoplastic and immunosuppressive drugs, initial encounter: Secondary | ICD-10-CM

## 2016-05-16 LAB — COMPREHENSIVE METABOLIC PANEL
ALT: 16 U/L (ref 0–55)
AST: 17 U/L (ref 5–34)
Albumin: 3.8 g/dL (ref 3.5–5.0)
Alkaline Phosphatase: 86 U/L (ref 40–150)
Anion Gap: 10 mEq/L (ref 3–11)
BILIRUBIN TOTAL: 0.37 mg/dL (ref 0.20–1.20)
BUN: 31.8 mg/dL — AB (ref 7.0–26.0)
CALCIUM: 9.7 mg/dL (ref 8.4–10.4)
CO2: 25 mEq/L (ref 22–29)
CREATININE: 1.4 mg/dL — AB (ref 0.6–1.1)
Chloride: 109 mEq/L (ref 98–109)
EGFR: 37 mL/min/{1.73_m2} — ABNORMAL LOW (ref >90)
Glucose: 125 mg/dl (ref 70–140)
Potassium: 4.3 mEq/L (ref 3.5–5.1)
Sodium: 144 mEq/L (ref 136–145)
TOTAL PROTEIN: 7 g/dL (ref 6.4–8.3)

## 2016-05-16 LAB — CBC WITH DIFFERENTIAL (CANCER CENTER ONLY)
BASO#: 0 10*3/uL (ref 0.0–0.2)
BASO%: 0.4 % (ref 0.0–2.0)
EOS%: 3.4 % (ref 0.0–7.0)
Eosinophils Absolute: 0.3 10*3/uL (ref 0.0–0.5)
HEMATOCRIT: 38.9 % (ref 34.8–46.6)
HEMOGLOBIN: 13.1 g/dL (ref 11.6–15.9)
LYMPH#: 1.3 10*3/uL (ref 0.9–3.3)
LYMPH%: 14.8 % (ref 14.0–48.0)
MCH: 30.6 pg (ref 26.0–34.0)
MCHC: 33.7 g/dL (ref 32.0–36.0)
MCV: 91 fL (ref 81–101)
MONO#: 0.5 10*3/uL (ref 0.1–0.9)
MONO%: 5.5 % (ref 0.0–13.0)
NEUT%: 75.9 % (ref 39.6–80.0)
NEUTROS ABS: 6.4 10*3/uL (ref 1.5–6.5)
Platelets: 147 10*3/uL (ref 145–400)
RBC: 4.28 10*6/uL (ref 3.70–5.32)
RDW: 14.2 % (ref 11.1–15.7)
WBC: 8.5 10*3/uL (ref 3.9–10.0)

## 2016-05-16 NOTE — Progress Notes (Signed)
Hematology and Oncology Follow Up Visit  Cassidy Ortiz [id number] December 11, 1940 75 y.o. 05/16/2016   Principle Diagnosis:  1. 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 2. 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 3. Intermittent iron-deficiency anemia. 4.  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 She definitely has invasive ductal carcinoma of the left breast. She underwent a lumpectomy on May 12. This was done by Dr. Dalbert Batman. The pathology report ([id number])  showed an invasive ductal carcinoma. It measured 1.2 cm. She had a negative sentinel lymph node. All margins were negative. There is no lymphovascular space invasion.  Surprisingly enough,  we sent off an Oncotype assay on her. The score was quite high. Her score was 32.  Because of this, I really think that she would be a candidate for adjuvant therapy. I showed she and her husband the assay report. Without chemotherapy, the risk of recurrence with antiestrogen therapy is about 21%. With chemotherapy, risk goes down to less than 10%.   Overall, she is doing quite well. She tolerated her surgery quite nicely. At her Dalbert Batman is a fantastic job. She is very pleased and very happy that he was able to operate on her as she really likes his bedside manner.  She is not smoking. She has had no problems with nausea or vomiting. She's had no change in bowel or bladder habits. She's had no leg swelling.   She is very worried about getting sick. I told that I would hope that she would not get sick as we have very good anti-emetics to try to help with postchemotherapy emesis.   Overall, her performance status is ECOG 1.    Medications:  Current outpatient prescriptions:  .  ALPRAZolam (XANAX) 0.25 MG tablet, Take 1 tablet (0.25 mg total) by mouth 3 (three) times  daily as needed for anxiety., Disp: 60 tablet, Rfl: 4 .  aspirin 81 MG tablet, Take 81 mg by mouth daily. Takes 2 tablets daily, Disp: , Rfl:  .  atorvastatin (LIPITOR) 40 MG tablet, TAKE 1 TABLET DAILY, Disp: 90 tablet, Rfl: 3 .  carvedilol (COREG) 12.5 MG tablet, TAKE 1 TABLET TWICE A DAY  WITH MEALS., Disp: 180 tablet, Rfl: 3 .  cloNIDine (CATAPRES) 0.1 MG tablet, Take 1 tablet (0.1 mg total) by mouth 3 (three) times daily., Disp: 270 tablet, Rfl: 3 .  fesoterodine (TOVIAZ) 4 MG TB24 tablet, Take 4 mg by mouth daily., Disp: , Rfl:  .  hydrALAZINE (APRESOLINE) 100 MG tablet, TAKE 1 TABLET 3 TIMES A DAY, Disp: 270 tablet, Rfl: 3 .  levothyroxine (SYNTHROID) 175 MCG tablet, Take 1 tablet (175 mcg total) by mouth daily., Disp: 90 tablet, Rfl: 3 .  lisinopril (PRINIVIL,ZESTRIL) 10 MG tablet, Take 10 mg by mouth daily., Disp: , Rfl:  .  metroNIDAZOLE (METROGEL) 1 % gel, Apply topically daily., Disp: 45 g, Rfl: 0 .  mirtazapine (REMERON) 30 MG tablet, Take 45 mg by mouth at bedtime. , Disp: , Rfl:  .  Multiple Vitamin (MULTIVITAMIN) tablet, Take 1 tablet by mouth daily.  , Disp: , Rfl:  .  nitroGLYCERIN (NITROSTAT) 0.4 MG SL tablet, Place 1 tablet (0.4 mg total) under the tongue every 5 (five) minutes as needed for chest pain., Disp: 25 tablet, Rfl:  3 .  rOPINIRole (REQUIP) 1 MG tablet, TAKE 1 TABLET (1 MG TOTAL) BY MOUTH DAILY AS NEEDED., Disp: 90 tablet, Rfl: 1 .  traZODone (DESYREL) 50 MG tablet, Take 50 mg by mouth at bedtime., Disp: , Rfl: 3 .  zolpidem (AMBIEN) 10 MG tablet, TAKE 1 AT BEDTIME AS NEEDED, Disp: , Rfl: 2 .  doxazosin (CARDURA) 8 MG tablet, TAKE 1 TABLET DAILY, Disp: 90 tablet, Rfl: 3 .  Esomeprazole Magnesium (NEXIUM PO), Take 20 mg by mouth every morning. , Disp: , Rfl:  No current facility-administered medications for this visit.  Facility-Administered Medications Ordered in Other Visits:  .  0.9 %  sodium chloride infusion, , Intravenous, Continuous, Volanda Napoleon, MD,  Stopped at 04/20/15 1651  Allergies:  Allergies  Allergen Reactions  . Amlodipine     Leg swelling    Past Medical History, Surgical history, Social history, and Family History were reviewed and updated.  Review of Systems: As above  Physical Exam:  height is 5' 7"  (1.702 m) and weight is 194 lb (87.998 kg). Her oral temperature is 98.2 F (36.8 C). Her blood pressure is 155/64 and her pulse is 63. Her respiration is 18.   Well-developed and well-nourished white female. Head and neck exam shows no ocular or oral lesions. She has no palpable cervical or supraclavicular lymph nodes. Lungs are clear. Cardiac exam regular in rhythm with no murmurs rubs or bruits. Abdomen is soft. She is good bowel sounds. There is no fluid wave. There is no guarding or rebound tenderness. There is no palpable liver or spleen tip.extremities shows the compression stockings bilaterally. She has minimal edema in her legs. No erythema is noted. She good range of motion and strength. Skin exam no rashes. Neurological exam is non-focal. Breast exam shows left breast  with a healing lumpectomy scar at about 11:00 position. There is some bruising noted. No distinct masses noted in the left breast. She has the healing left axillary sentinel node biopsy scar. Right breast shows contraction of the tissue. She has the nipple inversion which is chronic. She has some radiation changes. She has no obvious mass in the right breast. There is no right axillary adenopathy.   Lab Results  Component Value Date   WBC 8.5 05/16/2016   HGB 13.1 05/16/2016   HCT 38.9 05/16/2016   MCV 91 05/16/2016   PLT 147 05/16/2016     Chemistry      Component Value Date/Time   NA 144 05/16/2016 0901   NA 143 04/08/2016 1413   NA 144 03/19/2016 1128   NA 144 04/03/2015 1427   NA 142 07/06/2012 1153   K 4.3 05/16/2016 0901   K 4.1 04/08/2016 1413   K 4.3 03/19/2016 1128   K 3.9 04/03/2015 1427   CL 107 04/08/2016 1413   CL 106  03/19/2016 1128   CL 111 04/03/2015 1427   CO2 25 05/16/2016 0901   CO2 26 04/08/2016 1413   CO2 31 03/19/2016 1128   CO2 24 04/03/2015 1427   BUN 31.8* 05/16/2016 0901   BUN 30* 04/08/2016 1413   BUN 26* 03/19/2016 1128   BUN 31* 04/03/2015 1427   BUN 24 07/06/2012 1153   CREATININE 1.4* 05/16/2016 0901   CREATININE 1.4* 04/08/2016 1413   CREATININE 1.42* 03/19/2016 1128   CREATININE 1.49* 04/03/2015 1427      Component Value Date/Time   CALCIUM 9.7 05/16/2016 0901   CALCIUM 9.0 04/08/2016 1413   CALCIUM 9.2 03/19/2016  1128   CALCIUM 9.4 04/03/2015 1427   ALKPHOS 86 05/16/2016 0901   ALKPHOS 91* 04/08/2016 1413   ALKPHOS 122* 03/19/2016 1128   AST 17 05/16/2016 0901   AST 22 04/08/2016 1413   AST 14 03/19/2016 1128   ALT 16 05/16/2016 0901   ALT 19 04/08/2016 1413   ALT 16 03/19/2016 1128   BILITOT 0.37 05/16/2016 0901   BILITOT 0.60 04/08/2016 1413   BILITOT 0.4 03/19/2016 1128         Impression and Plan: Cassidy Ortiz is 75 year old female. 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 She has a past history of right renal cell carcinoma. Again I don't think these are a problem.   I believe that she will definitely benefit from adjuvant chemotherapy.  I spent about 45 minutes talking with she and her husband. Other issue would be a good candidate for Adriamycin/Cytoxan. Her last echocardiogram was done 4 years ago. Showed an excellent ejection fraction. We will have to get another one on her.   I think that she will need a Port-A-Cath. She just does not have good peripheral access. I do not 1 to risk the possibility of skin damage from the Adriamycin. I explained to her why I thought the Port-A-Cath will be helpful. We will get interventional radiology to put this in as Dr. Dalbert Batman will be out of town.   I went over the side effects of chemotherapy. I spent her about the hair loss. I told her it would take about 2 weeks for him to come out. I  told her that she will be at a high risk of infection because of the low white blood cells. She will need Neulasta. Because of where she lives, we probably can do the [facility] device.   Hopefully, nausea and vomiting will not be too bad. We will be very aggressive with our protocol.   After chemotherapy, she will need radiation therapy. She probably get this out at [facility] since she lives close to [place].   I offered her the chance to take chemotherapy out at [facility]. She wants to come see [place] as I have seen her now for about 16 years.   I will ideas started next week. She is okay with this.   I will then plan to see her back in 3 weeks. Again, I think 4 cycles is all that we will need.   We'll get on antiestrogen therapy, I probably will use Femara. I did this would be very reasonable and very well-tolerated. ect that she probably will need a course of adjuvant radiation.  I believe that we can probably just get her on aromatase inhibitor therapy. I think this would be very appropriate for her.  I think the chance of her needing chemotherapy will be less than 10%.   Volanda Napoleon, MD 6/8/201711:06 AM

## 2016-05-17 ENCOUNTER — Ambulatory Visit (INDEPENDENT_AMBULATORY_CARE_PROVIDER_SITE_OTHER): Payer: Medicare Other | Admitting: Internal Medicine

## 2016-05-17 ENCOUNTER — Encounter: Payer: Self-pay | Admitting: Internal Medicine

## 2016-05-17 VITALS — BP 122/78 | HR 56 | Ht 67.0 in | Wt 194.3 lb

## 2016-05-17 DIAGNOSIS — G4700 Insomnia, unspecified: Secondary | ICD-10-CM | POA: Diagnosis not present

## 2016-05-17 DIAGNOSIS — I10 Essential (primary) hypertension: Secondary | ICD-10-CM | POA: Diagnosis not present

## 2016-05-17 DIAGNOSIS — C50012 Malignant neoplasm of nipple and areola, left female breast: Secondary | ICD-10-CM

## 2016-05-17 NOTE — Progress Notes (Signed)
Pre visit review using our clinic review tool, if applicable. No additional management support is needed unless otherwise documented below in the visit note. 

## 2016-05-17 NOTE — Assessment & Plan Note (Signed)
BP Readings from Last 3 Encounters:  05/17/16 122/78  05/16/16 155/64  04/20/16 179/87   BP well controlled. Renal function yesterday stable. Continue current medications.

## 2016-05-17 NOTE — Assessment & Plan Note (Signed)
Reviewed notes from Dr. Marin Olp. Plan for PortaCath, then chemotherapy with Adriamycin/Cytoxan starting next week, goal of 4 cycles, followed by radiation therapy.

## 2016-05-17 NOTE — Patient Instructions (Signed)
Call to schedule follow up after chemotherapy or as needed.

## 2016-05-17 NOTE — Progress Notes (Signed)
Subjective:    Patient ID: Cassidy Ortiz, female    DOB: 11-10-1941, 75 y.o.   MRN: [id number]  HPI  75YO female presents for follow up.  Recently underwent left breast lumpectomy on 5/12. Tolerated well. Followed up with oncology 6/8 and reviewed pathology and Oncotype assay score. Recommended adjuvant chemotherapy with Adriamycin/Cytoxan, 4 cycles. Planning for Port-A-Cath access.  Feeling well. Nervous about upcoming procedures and chemotherapy but coping well. Having some trouble sleeping. Changed back to Trazodone by Dr. Nicolasa Ducking.   Wt Readings from Last 3 Encounters:  05/17/16 194 lb 4.8 oz (88.134 kg)  05/16/16 194 lb (87.998 kg)  04/19/16 195 lb (88.451 kg)   BP Readings from Last 3 Encounters:  05/17/16 122/78  05/16/16 155/64  04/20/16 179/87    Past Medical History  Diagnosis Date  . Vitamin D deficiency   . Mixed hyperlipidemia   . Anxiety state, unspecified   . Depressive disorder, not elsewhere classified   . Obstructive sleep apnea (adult) (pediatric)   . Hypertensive kidney disease, benign   . Cerebral aneurysm, nonruptured 2008    s/p coil and shunt, performed in [place]  . Duodenal ulcer   . Chronic kidney disease, stage III (moderate)   . Acquired cyst of kidney   . Abnormal weight gain   . Fatty liver   . Diverticulosis of colon (without mention of hemorrhage)   . Personal history of colonic polyps 10/22/2002    hyperplastic   . Hypertension   . Duodenal mass   . Baker's cyst of knee     Left, pt does not remember  . Edema   . Unspecified hypothyroidism   . Grave's disease   . Hypothyroid   . Sleep apnea     off cpap  . Cerebral aneurysm rupture ([facility])     stent  . Radiation 2001    BREAST CA  . Malignant neoplasm of breast (female), unspecified site 2001    right, s/p lumpectomy and XRT  . 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 Breast cancer ([place]) 2001    RT LUMPECTOMY  . Breast cancer of upper-inner  quadrant of left female breast ([place]) 04/19/2016   Family History  Problem Relation Age of Onset  . Colon cancer Sister   . Cancer Sister     colon  . Heart disease Father   . Esophageal cancer Neg Hx   . Rectal cancer Neg Hx   . Stomach cancer Neg Hx    Past Surgical History  Procedure Laterality Date  . Breast lumpectomy Right     right  . Partial nephrectomy      right  . Shoulder surgery      left  . Cataract extraction      bilateral  . Coronary stent placement      brain  . Femoral artery repair  2011  . Csf shunt  08/17/2008  . Hemiarthroplasty shoulder fracture      left  . Vaginal delivery      3  . Upper gastrointestinal endoscopy  11-10-2014  . Colonoscopy      last 2012.+ TA polyps  . Breast biopsy Left 03/14/2016    stereo  . Radioactive seed guided mastectomy with axillary sentinel lymph node biopsy Left 04/19/2016    Procedure: RADIOACTIVE SEED GUIDED PARTIAL MASTECTOMY WITH AXILLARY SENTINEL LYMPH NODE BIOPSY;  Surgeon: Fanny Skates, MD;  Location: [facility];  Service: General;  Laterality: Left;   Social History   Social History  . Marital Status: Married    Spouse Name: N/A  . Number of Children: 3  . Years of Education: N/A   Occupational History  . retired    Social History Main Topics  . Smoking status: Former Smoker -- 1.00 packs/day for 43 years    Types: Cigarettes    Start date: 04/01/1964    Quit date: 11/24/2007  . Smokeless tobacco: Never Used     Comment: quit smoking 7 years ago  . Alcohol Use: 0.6 oz/week    1 Glasses of wine per week     Comment: occasssionally  . Drug Use: No  . Sexual Activity: Not Currently   Other Topics Concern  . None   Social History Narrative   Lives in [place] with husband. From [place]. Children live [place] and [place].   Pets - 1 cat in home.      Work - retired Theme park manager, Educational psychologist   Diet - regular, limited calories          Review of Systems  Constitutional: Negative for fever,  chills, appetite change, fatigue and unexpected weight change.  Eyes: Negative for visual disturbance.  Respiratory: Negative for cough and shortness of breath.   Cardiovascular: Negative for chest pain and leg swelling.  Gastrointestinal: Negative for nausea, vomiting, abdominal pain, diarrhea and constipation.  Skin: Negative for color change and rash.  Hematological: Negative for adenopathy. Does not bruise/bleed easily.  Psychiatric/Behavioral: Positive for dysphoric mood. Negative for suicidal ideas and sleep disturbance. The patient is nervous/anxious.        Objective:    BP 122/78 mmHg  Pulse 56  Ht 5\' 7"  (1.702 m)  Wt 194 lb 4.8 oz (88.134 kg)  BMI 30.42 kg/m2  SpO2 95% Physical Exam  Constitutional: She is oriented to person, place, and time. She appears well-developed and well-nourished. No distress.  HENT:  Head: Normocephalic and atraumatic.  Right Ear: External ear normal.  Left Ear: External ear normal.  Nose: Nose normal.  Mouth/Throat: Oropharynx is clear and moist. No oropharyngeal exudate.  Eyes: Conjunctivae are normal. Pupils are equal, round, and reactive to light. Right eye exhibits no discharge. Left eye exhibits no discharge. No scleral icterus.  Neck: Normal range of motion. Neck supple. No tracheal deviation present. No thyromegaly present.  Cardiovascular: Normal rate, regular rhythm, normal heart sounds and intact distal pulses.  Exam reveals no gallop and no friction rub.   No murmur heard. Pulmonary/Chest: Effort normal and breath sounds normal. No respiratory distress. She has no wheezes. She has no rales. She exhibits no tenderness.  Musculoskeletal: Normal range of motion. She exhibits no edema or tenderness.  Lymphadenopathy:    She has no cervical adenopathy.  Neurological: She is alert and oriented to person, place, and time. No cranial nerve deficit. She exhibits normal muscle tone. Coordination normal.  Skin: Skin is warm and dry. No rash  noted. She is not diaphoretic. No erythema. No pallor.  Psychiatric: Her speech is normal and behavior is normal. Judgment and thought content normal. She exhibits a depressed mood.          Assessment & Plan:   Problem List Items Addressed This Visit      Unprioritized   Breast cancer ([place]) - Primary    Reviewed notes from Dr. Marin Olp. Plan for PortaCath, then chemotherapy with Adriamycin/Cytoxan starting next week, goal of 4 cycles, followed by radiation therapy.  Hypertension    BP Readings from Last 3 Encounters:  05/17/16 122/78  05/16/16 155/64  04/20/16 179/87   BP well controlled. Renal function yesterday stable. Continue current medications.      Insomnia    Changed from Ambien to Trazodone by Dr. Nicolasa Ducking. Difficult time for her. Not sleeping well with anxiety of upcoming chemotherapy.           Return in about 3 months (around 08/17/2016) for Recheck.  Ronette Deter, MD Internal Medicine [facility]

## 2016-05-17 NOTE — Assessment & Plan Note (Signed)
Changed from Ambien to Trazodone by Dr. Nicolasa Ducking. Difficult time for her. Not sleeping well with anxiety of upcoming chemotherapy.

## 2016-05-20 ENCOUNTER — Telehealth: Payer: Self-pay | Admitting: *Deleted

## 2016-05-20 ENCOUNTER — Ambulatory Visit
Admission: RE | Admit: 2016-05-20 | Discharge: 2016-05-20 | Disposition: A | Discharge status: Home / Self Care | Payer: Medicare Other | Source: Ambulatory Visit | Attending: Hematology & Oncology | Admitting: Hematology & Oncology

## 2016-05-20 DIAGNOSIS — I427 Cardiomyopathy due to drug and external agent: Secondary | ICD-10-CM | POA: Insufficient documentation

## 2016-05-20 DIAGNOSIS — C50212 Malignant neoplasm of upper-inner quadrant of left female breast: Secondary | ICD-10-CM | POA: Diagnosis not present

## 2016-05-20 DIAGNOSIS — T451X5A Adverse effect of antineoplastic and immunosuppressive drugs, initial encounter: Secondary | ICD-10-CM | POA: Insufficient documentation

## 2016-05-20 LAB — ECHOCARDIOGRAM LIMITED
FS: 46 % — AB (ref 28–44)
IV/PV OW: 0.86
LA diam end sys: 49 mm
LA diam index: 2.45 cm/m2
LASIZE: 49 mm
LDCA: 3.14 cm2
LV PW d: 12.7 mm — AB (ref 0.6–1.1)
LVOT diameter: 20 mm

## 2016-05-20 NOTE — Telephone Encounter (Addendum)
Patient aware of results.   ----- Message from Volanda Napoleon, MD sent at 05/20/2016  2:01 PM EDT ----- Call - heart is functioning very well!!!  This is fantastic!!  pete

## 2016-05-20 NOTE — Progress Notes (Signed)
*  PRELIMINARY RESULTS* Echocardiogram 2D Echocardiogram has been performed.  Cassidy Ortiz 05/20/2016, 10:12 AM

## 2016-05-21 ENCOUNTER — Other Ambulatory Visit: Payer: Medicare Other

## 2016-05-21 ENCOUNTER — Encounter: Payer: Self-pay | Admitting: *Deleted

## 2016-05-21 ENCOUNTER — Other Ambulatory Visit: Payer: Self-pay | Admitting: Physician Assistant

## 2016-05-22 ENCOUNTER — Other Ambulatory Visit: Payer: Self-pay | Admitting: Hematology & Oncology

## 2016-05-22 ENCOUNTER — Encounter (HOSPITAL_COMMUNITY): Payer: Self-pay

## 2016-05-22 ENCOUNTER — Ambulatory Visit (HOSPITAL_COMMUNITY)
Admission: RE | Admit: 2016-05-22 | Discharge: 2016-05-22 | Disposition: A | Discharge status: Home / Self Care | Payer: Medicare Other | Source: Ambulatory Visit | Attending: Hematology & Oncology | Admitting: Hematology & Oncology

## 2016-05-22 ENCOUNTER — Other Ambulatory Visit: Payer: Self-pay | Admitting: *Deleted

## 2016-05-22 DIAGNOSIS — Z7982 Long term (current) use of aspirin: Secondary | ICD-10-CM | POA: Insufficient documentation

## 2016-05-22 DIAGNOSIS — E782 Mixed hyperlipidemia: Secondary | ICD-10-CM | POA: Insufficient documentation

## 2016-05-22 DIAGNOSIS — E0500 Thyrotoxicosis with diffuse goiter without thyrotoxic crisis or storm: Secondary | ICD-10-CM | POA: Diagnosis not present

## 2016-05-22 DIAGNOSIS — G4733 Obstructive sleep apnea (adult) (pediatric): Secondary | ICD-10-CM | POA: Insufficient documentation

## 2016-05-22 DIAGNOSIS — T451X5A Adverse effect of antineoplastic and immunosuppressive drugs, initial encounter: Secondary | ICD-10-CM

## 2016-05-22 DIAGNOSIS — E039 Hypothyroidism, unspecified: Secondary | ICD-10-CM | POA: Insufficient documentation

## 2016-05-22 DIAGNOSIS — Z905 Acquired absence of kidney: Secondary | ICD-10-CM | POA: Insufficient documentation

## 2016-05-22 DIAGNOSIS — C50212 Malignant neoplasm of upper-inner quadrant of left female breast: Secondary | ICD-10-CM

## 2016-05-22 DIAGNOSIS — C50112 Malignant neoplasm of central portion of left female breast: Secondary | ICD-10-CM | POA: Diagnosis not present

## 2016-05-22 DIAGNOSIS — I129 Hypertensive chronic kidney disease with stage 1 through stage 4 chronic kidney disease, or unspecified chronic kidney disease: Secondary | ICD-10-CM | POA: Diagnosis not present

## 2016-05-22 DIAGNOSIS — K760 Fatty (change of) liver, not elsewhere classified: Secondary | ICD-10-CM | POA: Diagnosis not present

## 2016-05-22 DIAGNOSIS — N183 Chronic kidney disease, stage 3 (moderate): Secondary | ICD-10-CM | POA: Insufficient documentation

## 2016-05-22 DIAGNOSIS — Z87891 Personal history of nicotine dependence: Secondary | ICD-10-CM | POA: Diagnosis not present

## 2016-05-22 DIAGNOSIS — E559 Vitamin D deficiency, unspecified: Secondary | ICD-10-CM | POA: Diagnosis not present

## 2016-05-22 DIAGNOSIS — Z452 Encounter for adjustment and management of vascular access device: Secondary | ICD-10-CM | POA: Diagnosis not present

## 2016-05-22 DIAGNOSIS — I427 Cardiomyopathy due to drug and external agent: Secondary | ICD-10-CM

## 2016-05-22 DIAGNOSIS — F329 Major depressive disorder, single episode, unspecified: Secondary | ICD-10-CM | POA: Insufficient documentation

## 2016-05-22 LAB — CBC
HEMATOCRIT: 38.6 % (ref 36.0–46.0)
HEMOGLOBIN: 12.9 g/dL (ref 12.0–15.0)
MCH: 29.7 pg (ref 26.0–34.0)
MCHC: 33.4 g/dL (ref 30.0–36.0)
MCV: 88.7 fL (ref 78.0–100.0)
PLATELETS: 161 10*3/uL (ref 150–400)
RBC: 4.35 MIL/uL (ref 3.87–5.11)
RDW: 14.4 % (ref 11.5–15.5)
WBC: 5.8 10*3/uL (ref 4.0–10.5)

## 2016-05-22 LAB — PROTIME-INR
INR: 1.03 (ref 0.00–1.49)
Prothrombin Time: 13.3 seconds (ref 11.6–15.2)

## 2016-05-22 LAB — APTT: aPTT: 32 seconds (ref 24–37)

## 2016-05-22 MED ORDER — LIDOCAINE-PRILOCAINE 2.5-2.5 % EX CREA: TOPICAL_CREAM | CUTANEOUS | Status: DC

## 2016-05-22 MED ORDER — LIDOCAINE HCL 1 % IJ SOLN
INTRAMUSCULAR | Status: AC | PRN
  Administered 2016-05-22: 20 mL

## 2016-05-22 MED ORDER — CEFAZOLIN SODIUM-DEXTROSE 2-4 GM/100ML-% IV SOLN
2.0000 g | Freq: Once | INTRAVENOUS | Status: AC
  Administered 2016-05-22: 2 g via INTRAVENOUS
  Filled 2016-05-22: qty 100

## 2016-05-22 MED ORDER — PROCHLORPERAZINE MALEATE 10 MG PO TABS: 10.0000 mg | ORAL_TABLET | Freq: Four times a day (QID) | ORAL | Status: DC | PRN

## 2016-05-22 MED ORDER — DEXAMETHASONE 4 MG PO TABS: ORAL_TABLET | ORAL | Status: DC

## 2016-05-22 MED ORDER — ONDANSETRON HCL 8 MG PO TABS
8.0000 mg | ORAL_TABLET | Freq: Two times a day (BID) | ORAL | Status: DC | PRN
Start: 2016-05-22 — End: 2016-06-22

## 2016-05-22 MED ORDER — HYDRALAZINE HCL 20 MG/ML IJ SOLN
INTRAMUSCULAR | Status: AC
  Filled 2016-05-22: qty 1

## 2016-05-22 MED ORDER — LIDOCAINE HCL 1 % IJ SOLN
INTRAMUSCULAR | Status: AC
  Filled 2016-05-22: qty 20

## 2016-05-22 MED ORDER — FENTANYL CITRATE (PF) 100 MCG/2ML IJ SOLN
INTRAMUSCULAR | Status: AC
  Filled 2016-05-22: qty 4

## 2016-05-22 MED ORDER — LORAZEPAM 0.5 MG PO TABS: 0.5000 mg | ORAL_TABLET | Freq: Four times a day (QID) | ORAL | Status: DC | PRN

## 2016-05-22 MED ORDER — HEPARIN SOD (PORK) LOCK FLUSH 100 UNIT/ML IV SOLN
INTRAVENOUS | Status: AC
  Filled 2016-05-22: qty 5

## 2016-05-22 MED ORDER — ONDANSETRON HCL 8 MG PO TABS: 8.0000 mg | ORAL_TABLET | Freq: Two times a day (BID) | ORAL | Status: DC | PRN

## 2016-05-22 MED ORDER — HEPARIN SOD (PORK) LOCK FLUSH 100 UNIT/ML IV SOLN
INTRAVENOUS | Status: AC | PRN
  Administered 2016-05-22: 500 [IU]

## 2016-05-22 MED ORDER — MIDAZOLAM HCL 2 MG/2ML IJ SOLN
INTRAMUSCULAR | Status: AC
  Filled 2016-05-22: qty 6

## 2016-05-22 MED ORDER — SODIUM CHLORIDE 0.9 % IV SOLN
INTRAVENOUS | Status: DC
  Administered 2016-05-22: 13:00:00 via INTRAVENOUS

## 2016-05-22 MED ORDER — HYDRALAZINE HCL 20 MG/ML IJ SOLN
INTRAMUSCULAR | Status: AC | PRN
  Administered 2016-05-22 (×2): 10 mg via INTRAVENOUS

## 2016-05-22 NOTE — Discharge Instructions (Signed)
Implanted Port Insertion, Care After °Refer to this sheet in the next few weeks. These instructions provide you with information on caring for yourself after your procedure. Your health care provider may also give you more specific instructions. Your treatment has been planned according to current medical practices, but problems sometimes occur. Call your health care provider if you have any problems or questions after your procedure. °WHAT TO EXPECT AFTER THE PROCEDURE °After your procedure, it is typical to have the following:  °· Discomfort at the port insertion site. Ice packs to the area will help. °· Bruising on the skin over the port. This will subside in 3-4 days. °HOME CARE INSTRUCTIONS °· After your port is placed, you will get a manufacturer's information card. The card has information about your port. Keep this card with you at all times.   °· Know what kind of port you have. There are many types of ports available.   °· Wear a medical alert bracelet in case of an emergency. This can help alert health care workers that you have a port.   °· The port can stay in for as long as your health care provider believes it is necessary.   °· A home health care nurse may give medicines and take care of the port.   °· You or a family member can get special training and directions for giving medicine and taking care of the port at home.   °SEEK MEDICAL CARE IF:  °· Your port does not flush or you are unable to get a blood return.   °· You have a fever or chills. °SEEK IMMEDIATE MEDICAL CARE IF: °· You have new fluid or pus coming from your incision.   °· You notice a bad smell coming from your incision site.   °· You have swelling, pain, or more redness at the incision or port site.   °· You have chest pain or shortness of breath. °  °This information is not intended to replace advice given to you by your health care provider. Make sure you discuss any questions you have with your health care provider. °  °Document  Released: 09/15/2013 Document Revised: 11/30/2013 Document Reviewed: 09/15/2013 °Elsevier Interactive Patient Education ©2016 [employer] °Implanted Port Home Guide °An implanted port is a type of central line that is placed under the skin. Central lines are used to provide IV access when treatment or nutrition needs to be given through a person's veins. Implanted ports are used for long-term IV access. An implanted port may be placed because:  °· You need IV medicine that would be irritating to the small veins in your hands or arms.   °· You need long-term IV medicines, such as antibiotics.   °· You need IV nutrition for a long period.   °· You need frequent blood draws for lab tests.   °· You need dialysis.   °Implanted ports are usually placed in the chest area, but they can also be placed in the upper arm, the abdomen, or the leg. An implanted port has two main parts:  °· Reservoir. The reservoir is round and will appear as a small, raised area under your skin. The reservoir is the part where a needle is inserted to give medicines or draw blood.   °· Catheter. The catheter is a thin, flexible tube that extends from the reservoir. The catheter is placed into a large vein. Medicine that is inserted into the reservoir goes into the catheter and then into the vein.   °HOW WILL I CARE FOR MY INCISION SITE? °Do not get the   incision site wet. Bathe or shower as directed by your health care provider.  °HOW IS MY PORT ACCESSED? °Special steps must be taken to access the port:  °· Before the port is accessed, a numbing cream can be placed on the skin. This helps numb the skin over the port site.   °· Your health care provider uses a sterile technique to access the port. °· Your health care provider must put on a mask and sterile gloves. °· The skin over your port is cleaned carefully with an antiseptic and allowed to dry. °· The port is gently pinched between sterile gloves, and a needle is inserted into the  port. °· Only "non-coring" port needles should be used to access the port. Once the port is accessed, a blood return should be checked. This helps ensure that the port is in the vein and is not clogged.   °· If your port needs to remain accessed for a constant infusion, a clear (transparent) bandage will be placed over the needle site. The bandage and needle will need to be changed every week, or as directed by your health care provider.   °· Keep the bandage covering the needle clean and dry. Do not get it wet. Follow your health care provider's instructions on how to take a shower or bath while the port is accessed.   °· If your port does not need to stay accessed, no bandage is needed over the port.   °WHAT IS FLUSHING? °Flushing helps keep the port from getting clogged. Follow your health care provider's instructions on how and when to flush the port. Ports are usually flushed with saline solution or a medicine called heparin. The need for flushing will depend on how the port is used.  °· If the port is used for intermittent medicines or blood draws, the port will need to be flushed:   °· After medicines have been given.   °· After blood has been drawn.   °· As part of routine maintenance.   °· If a constant infusion is running, the port may not need to be flushed.   °HOW LONG WILL MY PORT STAY IMPLANTED? °The port can stay in for as long as your health care provider thinks it is needed. When it is time for the port to come out, surgery will be done to remove it. The procedure is similar to the one performed when the port was put in.  °WHEN SHOULD I SEEK IMMEDIATE MEDICAL CARE? °When you have an implanted port, you should seek immediate medical care if:  °· You notice a bad smell coming from the incision site.   °· You have swelling, redness, or drainage at the incision site.   °· You have more swelling or pain at the port site or the surrounding area.   °· You have a fever that is not controlled with  medicine. °  °This information is not intended to replace advice given to you by your health care provider. Make sure you discuss any questions you have with your health care provider. °  °Document Released: 11/25/2005 Document Revised: 09/15/2013 Document Reviewed: 08/02/2013 °Elsevier Interactive Patient Education ©2016 [employer] ° ° °Moderate Conscious Sedation, Adult, Care After °Refer to this sheet in the next few weeks. These instructions provide you with information on caring for yourself after your procedure. Your health care provider may also give you more specific instructions. Your treatment has been planned according to current medical practices, but problems sometimes occur. Call your health care provider if you have any problems or questions   after your procedure. °WHAT TO EXPECT AFTER THE PROCEDURE  °After your procedure: °· You may feel sleepy, clumsy, and have poor balance for several hours. °· Vomiting may occur if you eat too soon after the procedure. °HOME CARE INSTRUCTIONS °· Do not participate in any activities where you could become injured for at least 24 hours. Do not: °¨ Drive. °¨ Swim. °¨ Ride a bicycle. °¨ Operate heavy machinery. °¨ Cook. °¨ Use power tools. °¨ Climb ladders. °¨ Work from a high place. °· Do not make important decisions or sign legal documents until you are improved. °· If you vomit, drink water, juice, or soup when you can drink without vomiting. Make sure you have little or no nausea before eating solid foods. °· Only take over-the-counter or prescription medicines for pain, discomfort, or fever as directed by your health care provider. °· Make sure you and your family fully understand everything about the medicines given to you, including what side effects may occur. °· You should not drink alcohol, take sleeping pills, or take medicines that cause drowsiness for at least 24 hours. °· If you smoke, do not smoke without supervision. °· If you are feeling better, you  may resume normal activities 24 hours after you were sedated. °· Keep all appointments with your health care provider. °SEEK MEDICAL CARE IF: °· Your skin is pale or bluish in color. °· You continue to feel nauseous or vomit. °· Your pain is getting worse and is not helped by medicine. °· You have bleeding or swelling. °· You are still sleepy or feeling clumsy after 24 hours. °SEEK IMMEDIATE MEDICAL CARE IF: °· You develop a rash. °· You have difficulty breathing. °· You develop any type of allergic problem. °· You have a fever. °MAKE SURE YOU: °· Understand these instructions. °· Will watch your condition. °· Will get help right away if you are not doing well or get worse. °  °This information is not intended to replace advice given to you by your health care provider. Make sure you discuss any questions you have with your health care provider. °  °Document Released: 09/15/2013 Document Revised: 12/16/2014 Document Reviewed: 09/15/2013 °Elsevier Interactive Patient Education ©2016 Elsevier Inc. ° °

## 2016-05-22 NOTE — H&P (Signed)
Chief Complaint: left breast cancer  Referring Physician:Dr. Burney Gauze  Supervising Physician: Corrie Mckusick  Patient Status:  Out-pt  HPI: Cassidy Ortiz is an 75 y.o. female with a history of right sided breast cancer who has undergone a lumpectomy in the past.  She now has left sided breast cancer, s/p lumpectomy and is present for a PAC placement.  She does of note, have some type of shunt following a brain aneurysm 8 years ago that appears to be in the IJ.  She does not know exactly where this is, and it was placed elsewhere so there are no records of this here.  She has otherwise been feeling well with no new complaints.  Past Medical History:  Past Medical History  Diagnosis Date  . Vitamin D deficiency   . Mixed hyperlipidemia   . Anxiety state, unspecified   . Depressive disorder, not elsewhere classified   . Obstructive sleep apnea (adult) (pediatric)   . Hypertensive kidney disease, benign   . Cerebral aneurysm, nonruptured 2008    s/p coil and shunt, performed in [place]  . Duodenal ulcer   . Chronic kidney disease, stage III (moderate)   . Acquired cyst of kidney   . Abnormal weight gain   . Fatty liver   . Diverticulosis of colon (without mention of hemorrhage)   . Personal history of colonic polyps 10/22/2002    hyperplastic   . Hypertension   . Duodenal mass   . Baker's cyst of knee     Left, pt does not remember  . Edema   . Unspecified hypothyroidism   . Grave's disease   . Hypothyroid   . Sleep apnea     off cpap  . Cerebral aneurysm rupture (HCC)     stent  . Radiation 2001    BREAST CA  . Malignant neoplasm of breast (female), unspecified site 2001    right, s/p lumpectomy and XRT  . 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 Breast cancer ([facility]) 2001    RT LUMPECTOMY  . Breast cancer of upper-inner quadrant of left female breast ([facility]) 04/19/2016    Past Surgical History:  Past Surgical History  Procedure  Laterality Date  . Breast lumpectomy Right     right  . Partial nephrectomy      right  . Shoulder surgery      left  . Cataract extraction      bilateral  . Coronary stent placement      brain  . Femoral artery repair  2011  . Csf shunt  08/17/2008  . Hemiarthroplasty shoulder fracture      left  . Vaginal delivery      3  . Upper gastrointestinal endoscopy  11-10-2014  . Colonoscopy      last 2012.+ TA polyps  . Breast biopsy Left 03/14/2016    stereo  . Radioactive seed guided mastectomy with axillary sentinel lymph node biopsy Left 04/19/2016    Procedure: RADIOACTIVE SEED GUIDED PARTIAL MASTECTOMY WITH AXILLARY SENTINEL LYMPH NODE BIOPSY;  Surgeon: Fanny Skates, MD;  Location: [facility];  Service: General;  Laterality: Left;    Family History:  Family History  Problem Relation Age of Onset  . Colon cancer Sister   . Cancer Sister     colon  . Heart disease Father   . Esophageal cancer Neg Hx   . Rectal cancer Neg Hx   . Stomach cancer  Neg Hx     Social History:  reports that she quit smoking about 8 years ago. Her smoking use included Cigarettes. She started smoking about 52 years ago. She has a 43 pack-year smoking history. She has never used smokeless tobacco. She reports that she drinks about 0.6 oz of alcohol per week. She reports that she does not use illicit drugs.  Allergies:  Allergies  Allergen Reactions  . Amlodipine     Leg swelling    Medications:   Medication List    ASK your doctor about these medications        ALPRAZolam 0.25 MG tablet  Commonly known as:  XANAX  Take 1 tablet (0.25 mg total) by mouth 3 (three) times daily as needed for anxiety.     aspirin 81 MG tablet  Take 81 mg by mouth daily. Takes 2 tablets daily     atorvastatin 40 MG tablet  Commonly known as:  LIPITOR  TAKE 1 TABLET DAILY     carvedilol 12.5 MG tablet  Commonly known as:  COREG  TAKE 1 TABLET TWICE A DAY  WITH MEALS.     cloNIDine 0.1 MG  tablet  Commonly known as:  CATAPRES  Take 1 tablet (0.1 mg total) by mouth 3 (three) times daily.     dexamethasone 4 MG tablet  Commonly known as:  DECADRON  Take 2 tablets by mouth once a day on the day after chemotherapy and then take 2 tablets two times a day for 2 days. Take with food.     doxazosin 8 MG tablet  Commonly known as:  CARDURA  TAKE 1 TABLET DAILY     hydrALAZINE 100 MG tablet  Commonly known as:  APRESOLINE  TAKE 1 TABLET 3 TIMES A DAY     levothyroxine 175 MCG tablet  Commonly known as:  SYNTHROID  Take 1 tablet (175 mcg total) by mouth daily.     lidocaine-prilocaine cream  Commonly known as:  EMLA  Apply to affected area as needed     lisinopril 10 MG tablet  Commonly known as:  PRINIVIL,ZESTRIL  Take 10 mg by mouth daily.     LORazepam 0.5 MG tablet  Commonly known as:  ATIVAN  Take 1 tablet (0.5 mg total) by mouth every 6 (six) hours as needed (Nausea or vomiting).     metroNIDAZOLE 1 % gel  Commonly known as:  METROGEL  Apply topically daily.     mirtazapine 30 MG tablet  Commonly known as:  REMERON  Take 45 mg by mouth at bedtime.     multivitamin tablet  Take 1 tablet by mouth daily.     NEXIUM PO  Take 20 mg by mouth every morning.     nitroGLYCERIN 0.4 MG SL tablet  Commonly known as:  NITROSTAT  Place 1 tablet (0.4 mg total) under the tongue every 5 (five) minutes as needed for chest pain.     ondansetron 8 MG tablet  Commonly known as:  ZOFRAN  Take 1 tablet (8 mg total) by mouth 2 (two) times daily as needed. Start on the third day after chemotherapy.     prochlorperazine 10 MG tablet  Commonly known as:  COMPAZINE  Take 1 tablet (10 mg total) by mouth every 6 (six) hours as needed (Nausea or vomiting).     rOPINIRole 1 MG tablet  Commonly known as:  REQUIP  TAKE 1 TABLET (1 MG TOTAL) BY MOUTH DAILY AS NEEDED.  TOVIAZ 4 MG Tb24 tablet  Generic drug:  fesoterodine  Take 4 mg by mouth daily.     traZODone 50 MG tablet   Commonly known as:  DESYREL  Take 100 mg by mouth at bedtime.        Please HPI for pertinent positives, otherwise complete 10 system ROS negative.  Mallampati Score: MD Evaluation Airway: WNL Heart: WNL Abdomen: WNL Chest/ Lungs: WNL ASA  Classification: 3 Mallampati/Airway Score: Two  Physical Exam: BP 159/69 mmHg  Pulse 50  Temp(Src) 98.9 F (37.2 C) (Oral)  Resp 16  Ht 5\' 7"  (1.702 m)  Wt 193 lb 8 oz (87.771 kg)  BMI 30.30 kg/m2  SpO2 97% Body mass index is 30.3 kg/(m^2). General: pleasant, WD, WN white female who is laying in bed in NAD HEENT: head is normocephalic, atraumatic.  Sclera are noninjected.  PERRL.  Ears and nose without any masses or lesions.  Mouth is pink and moist.  Palpable shunt on the right side of her neck, ? In IJ Heart: regular, rate, and rhythm.  Normal s1,s2. No obvious murmurs, gallops, or rubs noted.  Palpable radial and pedal pulses bilaterally Lungs: CTAB, no wheezes, rhonchi, or rales noted.  Respiratory effort nonlabored Abd: soft, NT, ND, +BS, no masses, hernias, or organomegaly MS: all 4 extremities are symmetrical with no cyanosis, clubbing, or edema. Psych: A&Ox3 with an appropriate affect.   Labs: Results for orders placed or performed during the hospital encounter of 05/22/16 (from the past 48 hour(s))  APTT upon arrival     Status: None   Collection Time: 05/22/16 12:45 PM  Result Value Ref Range   aPTT 32 24 - 37 seconds  CBC upon arrival     Status: None   Collection Time: 05/22/16 12:45 PM  Result Value Ref Range   WBC 5.8 4.0 - 10.5 K/uL   RBC 4.35 3.87 - 5.11 MIL/uL   Hemoglobin 12.9 12.0 - 15.0 g/dL   HCT 38.6 36.0 - 46.0 %   MCV 88.7 78.0 - 100.0 fL   MCH 29.7 26.0 - 34.0 pg   MCHC 33.4 30.0 - 36.0 g/dL   RDW 14.4 11.5 - 15.5 %   Platelets 161 150 - 400 K/uL  Protime-INR upon arrival     Status: None   Collection Time: 05/22/16 12:45 PM  Result Value Ref Range   Prothrombin Time 13.3 11.6 - 15.2 seconds   INR  1.03 0.00 - 1.49    Imaging: No results found.  Assessment/Plan 1. Breast cancer -we will plan for PAC placement.  Her current breast cancer is on the left and we generally like to place the ports on the contralateral side; however, her shunt is on this side.  I have discussed this with Dr. Earleen Newport.  Location of placement will be made at his discretion. -labs and vitals have otherwise been reviewed and are acceptable -Risks and Benefits discussed with the patient including, but not limited to bleeding, infection, pneumothorax, or fibrin sheath development and need for additional procedures. All of the patient's questions were answered, patient is agreeable to proceed. Consent signed and in chart.   Thank you for this interesting consult.  I greatly enjoyed meeting Cassidy Ortiz and look forward to participating in their care.  A copy of this report was sent to the requesting provider on this date.  Electronically Signed: Henreitta Cea 05/22/2016, 1:58 PM   I spent a total of  30 Minutes   in face to face in  clinical consultation, greater than 50% of which was counseling/coordinating care for breast cancer

## 2016-05-22 NOTE — Procedures (Signed)
Interventional Radiology Procedure Note  Procedure: Placement of a right IJ approach single lumen PowerPort.  Tip is positioned at the superior cavoatrial junction and catheter is ready for immediate use.  Complications: None Recommendations:  - Ok to shower tomorrow - Do not submerge for 7 days - Routine line care   Signed,  Ebelin Dillehay S. Shakea Isip, DO   

## 2016-05-23 ENCOUNTER — Ambulatory Visit: Payer: Medicare Other | Admitting: Hematology & Oncology

## 2016-05-23 ENCOUNTER — Ambulatory Visit (HOSPITAL_BASED_OUTPATIENT_CLINIC_OR_DEPARTMENT_OTHER): Payer: Medicare Other

## 2016-05-23 ENCOUNTER — Other Ambulatory Visit: Payer: Medicare Other

## 2016-05-23 ENCOUNTER — Other Ambulatory Visit (HOSPITAL_BASED_OUTPATIENT_CLINIC_OR_DEPARTMENT_OTHER): Payer: Medicare Other

## 2016-05-23 VITALS — BP 151/61 | HR 55 | Temp 98.4°F | Resp 18

## 2016-05-23 DIAGNOSIS — Z5189 Encounter for other specified aftercare: Secondary | ICD-10-CM

## 2016-05-23 DIAGNOSIS — C50912 Malignant neoplasm of unspecified site of left female breast: Secondary | ICD-10-CM | POA: Diagnosis not present

## 2016-05-23 DIAGNOSIS — Z5111 Encounter for antineoplastic chemotherapy: Secondary | ICD-10-CM | POA: Diagnosis not present

## 2016-05-23 DIAGNOSIS — Z853 Personal history of malignant neoplasm of breast: Secondary | ICD-10-CM | POA: Diagnosis present

## 2016-05-23 DIAGNOSIS — C50212 Malignant neoplasm of upper-inner quadrant of left female breast: Secondary | ICD-10-CM

## 2016-05-23 LAB — CMP (CANCER CENTER ONLY)
ALBUMIN: 3.1 g/dL — AB (ref 3.3–5.5)
ALK PHOS: 65 U/L (ref 26–84)
ALT: 13 U/L (ref 10–47)
AST: 17 U/L (ref 11–38)
BILIRUBIN TOTAL: 0.6 mg/dL (ref 0.20–1.60)
BUN, Bld: 26 mg/dL — ABNORMAL HIGH (ref 7–22)
CALCIUM: 8.8 mg/dL (ref 8.0–10.3)
CO2: 27 meq/L (ref 18–33)
CREATININE: 1.3 mg/dL — AB (ref 0.6–1.2)
Chloride: 106 mEq/L (ref 98–108)
GLUCOSE: 119 mg/dL — AB (ref 73–118)
Potassium: 3.4 mEq/L (ref 3.3–4.7)
SODIUM: 138 meq/L (ref 128–145)
Total Protein: 5.9 g/dL — ABNORMAL LOW (ref 6.4–8.1)

## 2016-05-23 LAB — CBC WITH DIFFERENTIAL (CANCER CENTER ONLY)
BASO#: 0 10*3/uL (ref 0.0–0.2)
BASO%: 0.4 % (ref 0.0–2.0)
EOS%: 6.2 % (ref 0.0–7.0)
Eosinophils Absolute: 0.4 10*3/uL (ref 0.0–0.5)
HEMATOCRIT: 37.4 % (ref 34.8–46.6)
HEMOGLOBIN: 12.6 g/dL (ref 11.6–15.9)
LYMPH#: 1.2 10*3/uL (ref 0.9–3.3)
LYMPH%: 21.4 % (ref 14.0–48.0)
MCH: 30.2 pg (ref 26.0–34.0)
MCHC: 33.7 g/dL (ref 32.0–36.0)
MCV: 90 fL (ref 81–101)
MONO#: 0.5 10*3/uL (ref 0.1–0.9)
MONO%: 8 % (ref 0.0–13.0)
NEUT%: 64 % (ref 39.6–80.0)
NEUTROS ABS: 3.6 10*3/uL (ref 1.5–6.5)
Platelets: 150 10*3/uL (ref 145–400)
RBC: 4.17 10*6/uL (ref 3.70–5.32)
RDW: 14.3 % (ref 11.1–15.7)
WBC: 5.7 10*3/uL (ref 3.9–10.0)

## 2016-05-23 MED ORDER — FOSAPREPITANT DIMEGLUMINE INJECTION 150 MG
Freq: Once | INTRAVENOUS | Status: AC
  Administered 2016-05-23: 11:00:00 via INTRAVENOUS
  Filled 2016-05-23: qty 5

## 2016-05-23 MED ORDER — SODIUM CHLORIDE 0.9 % IV SOLN
600.0000 mg/m2 | Freq: Once | INTRAVENOUS | Status: AC
  Administered 2016-05-23: 1220 mg via INTRAVENOUS
  Filled 2016-05-23: qty 61

## 2016-05-23 MED ORDER — SODIUM CHLORIDE 0.9% FLUSH
10.0000 mL | INTRAVENOUS | Status: DC | PRN
  Administered 2016-05-23: 10 mL
  Filled 2016-05-23: qty 10

## 2016-05-23 MED ORDER — SODIUM CHLORIDE 0.9 % IV SOLN
Freq: Once | INTRAVENOUS | Status: AC
  Administered 2016-05-23: 11:00:00 via INTRAVENOUS

## 2016-05-23 MED ORDER — PEGFILGRASTIM 6 MG/0.6ML ~~LOC~~ PSKT
PREFILLED_SYRINGE | SUBCUTANEOUS | Status: AC
Start: 2016-05-23 — End: 2016-05-23
  Filled 2016-05-23: qty 0.6

## 2016-05-23 MED ORDER — HEPARIN SOD (PORK) LOCK FLUSH 100 UNIT/ML IV SOLN
500.0000 [IU] | Freq: Once | INTRAVENOUS | Status: AC | PRN
  Administered 2016-05-23: 500 [IU]
  Filled 2016-05-23: qty 5

## 2016-05-23 MED ORDER — PALONOSETRON HCL INJECTION 0.25 MG/5ML
INTRAVENOUS | Status: AC
Start: 2016-05-23 — End: 2016-05-23
  Filled 2016-05-23: qty 5

## 2016-05-23 MED ORDER — DOXORUBICIN HCL CHEMO IV INJECTION 2 MG/ML
48.0000 mg/m2 | Freq: Once | INTRAVENOUS | Status: AC
  Administered 2016-05-23: 98 mg via INTRAVENOUS
  Filled 2016-05-23: qty 49

## 2016-05-23 MED ORDER — PALONOSETRON HCL INJECTION 0.25 MG/5ML
0.2500 mg | Freq: Once | INTRAVENOUS | Status: AC
  Administered 2016-05-23: 0.25 mg via INTRAVENOUS

## 2016-05-23 MED ORDER — PEGFILGRASTIM 6 MG/0.6ML ~~LOC~~ PSKT
6.0000 mg | PREFILLED_SYRINGE | Freq: Once | SUBCUTANEOUS | Status: AC
  Administered 2016-05-23: 6 mg via SUBCUTANEOUS

## 2016-05-23 NOTE — Patient Instructions (Signed)
[facility] Discharge Instructions for Patients Receiving Chemotherapy  Today you received the following chemotherapy agents Cytoxan and Doxorubicin  Medication to numb your Port: EMLA Cream Apply quarter size application to [place] site 1 hour prior to chemotherapy. Do NOT rub in. Cover area with plastic wrap.    If you develop nausea and vomiting that is not controlled by your nausea medication, call the clinic. If it is after clinic hours your family physician or the after hours number for the clinic or go to the Emergency Department.   BELOW ARE SYMPTOMS THAT SHOULD BE REPORTED IMMEDIATELY:  *FEVER GREATER THAN 100.5 F  *CHILLS WITH OR WITHOUT FEVER  NAUSEA AND VOMITING THAT IS NOT CONTROLLED WITH YOUR NAUSEA MEDICATION  *UNUSUAL SHORTNESS OF BREATH  *UNUSUAL BRUISING OR BLEEDING  TENDERNESS IN MOUTH AND THROAT WITH OR WITHOUT PRESENCE OF ULCERS  *URINARY PROBLEMS  *BOWEL PROBLEMS  UNUSUAL RASH Items with * indicate a potential emergency and should be followed up as soon as possible.  One of the nurses will contact you 24 hours after your treatment. Please let the nurse know about any problems that you may have experienced. Feel free to call the clinic you have any questions or concerns. The clinic phone number is [phone].   I have been informed and understand all the instructions given to me. I know to contact the clinic, my physician, or go to the Emergency Department if any problems should occur. I do not have any questions at this time, but understand that I may call the clinic during office hours   should I have any questions or need assistance in obtaining follow up care.    __________________________________________  _____________  __________ Signature of Patient or Authorized Representative            Date                   Time    __________________________________________ Nurse's Signature

## 2016-05-24 ENCOUNTER — Telehealth: Payer: Self-pay

## 2016-05-24 ENCOUNTER — Telehealth: Payer: Self-pay | Admitting: *Deleted

## 2016-05-24 NOTE — Telephone Encounter (Signed)
Faxed to number provided. thanks

## 2016-05-24 NOTE — Telephone Encounter (Signed)
Patient doing well after chemotherapy yesterday. She denies any problems with n/v and states she is eating and drinking well. She does report constipation. Instructed patient to use Miralax BID until she has a BM and then to use daily. She understood. She had no other questions or concerns. She knows to call the office with any problems or questions.

## 2016-05-24 NOTE — Telephone Encounter (Signed)
Cassidy Ortiz from [facility] has requested progress notes form 04/16/16 office visit . Fax [phone]

## 2016-05-24 NOTE — Telephone Encounter (Signed)
Patient call back today for chemo follow up. Patient stated that she is doing very well no nausea or vomiting.

## 2016-05-29 ENCOUNTER — Encounter: Payer: Self-pay | Admitting: *Deleted

## 2016-05-30 ENCOUNTER — Other Ambulatory Visit: Payer: Self-pay | Admitting: *Deleted

## 2016-05-30 ENCOUNTER — Telehealth: Payer: Self-pay | Admitting: *Deleted

## 2016-05-30 DIAGNOSIS — C50012 Malignant neoplasm of nipple and areola, left female breast: Secondary | ICD-10-CM

## 2016-05-30 NOTE — Telephone Encounter (Signed)
Patient called stating she has diarrhea that started yesterday and continued through the night.  Also having nausea and vomiting.  Dr. Marin Olp notified.  Patient to take immodium 2 tablets with first diarrhea stool then take 1 with each diarrhea stool. Notify us if no better.   PAtient is taking Zofran for nausea with success.

## 2016-05-30 NOTE — Telephone Encounter (Signed)
Okay to refill? This is listed as a historical medication. Pt was seen earlier this month.

## 2016-06-01 ENCOUNTER — Observation Stay: Payer: Medicare Other

## 2016-06-01 ENCOUNTER — Other Ambulatory Visit: Payer: Self-pay

## 2016-06-01 ENCOUNTER — Emergency Department: Payer: Medicare Other

## 2016-06-01 ENCOUNTER — Observation Stay
Admission: EM | Admit: 2016-06-01 | Discharge: 2016-06-02 | Disposition: A | Discharge status: Home / Self Care | Payer: Medicare Other | Attending: Internal Medicine | Admitting: Internal Medicine

## 2016-06-01 DIAGNOSIS — N281 Cyst of kidney, acquired: Secondary | ICD-10-CM | POA: Diagnosis not present

## 2016-06-01 DIAGNOSIS — Z9012 Acquired absence of left breast and nipple: Secondary | ICD-10-CM | POA: Insufficient documentation

## 2016-06-01 DIAGNOSIS — Z6830 Body mass index (BMI) 30.0-30.9, adult: Secondary | ICD-10-CM | POA: Insufficient documentation

## 2016-06-01 DIAGNOSIS — Z8249 Family history of ischemic heart disease and other diseases of the circulatory system: Secondary | ICD-10-CM | POA: Diagnosis not present

## 2016-06-01 DIAGNOSIS — D1800 Hemangioma unspecified site: Secondary | ICD-10-CM | POA: Diagnosis not present

## 2016-06-01 DIAGNOSIS — Z7982 Long term (current) use of aspirin: Secondary | ICD-10-CM | POA: Insufficient documentation

## 2016-06-01 DIAGNOSIS — Z905 Acquired absence of kidney: Secondary | ICD-10-CM | POA: Diagnosis not present

## 2016-06-01 DIAGNOSIS — C50212 Malignant neoplasm of upper-inner quadrant of left female breast: Secondary | ICD-10-CM | POA: Diagnosis not present

## 2016-06-01 DIAGNOSIS — M545 Low back pain, unspecified: Secondary | ICD-10-CM

## 2016-06-01 DIAGNOSIS — Z8679 Personal history of other diseases of the circulatory system: Secondary | ICD-10-CM | POA: Diagnosis not present

## 2016-06-01 DIAGNOSIS — Z982 Presence of cerebrospinal fluid drainage device: Secondary | ICD-10-CM | POA: Insufficient documentation

## 2016-06-01 DIAGNOSIS — Z9841 Cataract extraction status, right eye: Secondary | ICD-10-CM | POA: Insufficient documentation

## 2016-06-01 DIAGNOSIS — M4806 Spinal stenosis, lumbar region: Secondary | ICD-10-CM | POA: Diagnosis not present

## 2016-06-01 DIAGNOSIS — Z9842 Cataract extraction status, left eye: Secondary | ICD-10-CM | POA: Insufficient documentation

## 2016-06-01 DIAGNOSIS — M5136 Other intervertebral disc degeneration, lumbar region: Secondary | ICD-10-CM | POA: Diagnosis not present

## 2016-06-01 DIAGNOSIS — N183 Chronic kidney disease, stage 3 (moderate): Secondary | ICD-10-CM | POA: Diagnosis not present

## 2016-06-01 DIAGNOSIS — I130 Hypertensive heart and chronic kidney disease with heart failure and stage 1 through stage 4 chronic kidney disease, or unspecified chronic kidney disease: Secondary | ICD-10-CM | POA: Insufficient documentation

## 2016-06-01 DIAGNOSIS — Z85528 Personal history of other malignant neoplasm of kidney: Secondary | ICD-10-CM | POA: Diagnosis not present

## 2016-06-01 DIAGNOSIS — K8020 Calculus of gallbladder without cholecystitis without obstruction: Secondary | ICD-10-CM | POA: Insufficient documentation

## 2016-06-01 DIAGNOSIS — E782 Mixed hyperlipidemia: Secondary | ICD-10-CM | POA: Diagnosis not present

## 2016-06-01 DIAGNOSIS — R079 Chest pain, unspecified: Secondary | ICD-10-CM

## 2016-06-01 DIAGNOSIS — Z955 Presence of coronary angioplasty implant and graft: Secondary | ICD-10-CM | POA: Insufficient documentation

## 2016-06-01 DIAGNOSIS — K5730 Diverticulosis of large intestine without perforation or abscess without bleeding: Secondary | ICD-10-CM | POA: Insufficient documentation

## 2016-06-01 DIAGNOSIS — Z7952 Long term (current) use of systemic steroids: Secondary | ICD-10-CM | POA: Insufficient documentation

## 2016-06-01 DIAGNOSIS — D696 Thrombocytopenia, unspecified: Secondary | ICD-10-CM | POA: Insufficient documentation

## 2016-06-01 DIAGNOSIS — Z888 Allergy status to other drugs, medicaments and biological substances status: Secondary | ICD-10-CM | POA: Diagnosis not present

## 2016-06-01 DIAGNOSIS — Z86010 Personal history of colonic polyps: Secondary | ICD-10-CM | POA: Diagnosis not present

## 2016-06-01 DIAGNOSIS — I700 Atherosclerosis of aorta: Secondary | ICD-10-CM | POA: Diagnosis not present

## 2016-06-01 DIAGNOSIS — I2510 Atherosclerotic heart disease of native coronary artery without angina pectoris: Secondary | ICD-10-CM | POA: Diagnosis not present

## 2016-06-01 DIAGNOSIS — F419 Anxiety disorder, unspecified: Secondary | ICD-10-CM | POA: Insufficient documentation

## 2016-06-01 DIAGNOSIS — F329 Major depressive disorder, single episode, unspecified: Secondary | ICD-10-CM | POA: Diagnosis not present

## 2016-06-01 DIAGNOSIS — G43909 Migraine, unspecified, not intractable, without status migrainosus: Secondary | ICD-10-CM | POA: Insufficient documentation

## 2016-06-01 DIAGNOSIS — I10 Essential (primary) hypertension: Secondary | ICD-10-CM | POA: Diagnosis not present

## 2016-06-01 DIAGNOSIS — E669 Obesity, unspecified: Secondary | ICD-10-CM | POA: Insufficient documentation

## 2016-06-01 DIAGNOSIS — K760 Fatty (change of) liver, not elsewhere classified: Secondary | ICD-10-CM | POA: Diagnosis not present

## 2016-06-01 DIAGNOSIS — E0500 Thyrotoxicosis with diffuse goiter without thyrotoxic crisis or storm: Secondary | ICD-10-CM | POA: Insufficient documentation

## 2016-06-01 DIAGNOSIS — G4733 Obstructive sleep apnea (adult) (pediatric): Secondary | ICD-10-CM | POA: Insufficient documentation

## 2016-06-01 DIAGNOSIS — Z9889 Other specified postprocedural states: Secondary | ICD-10-CM | POA: Insufficient documentation

## 2016-06-01 DIAGNOSIS — Z800 Family history of malignant neoplasm of digestive organs: Secondary | ICD-10-CM | POA: Insufficient documentation

## 2016-06-01 DIAGNOSIS — Z79899 Other long term (current) drug therapy: Secondary | ICD-10-CM | POA: Insufficient documentation

## 2016-06-01 DIAGNOSIS — Z87891 Personal history of nicotine dependence: Secondary | ICD-10-CM | POA: Diagnosis not present

## 2016-06-01 DIAGNOSIS — F40240 Claustrophobia: Secondary | ICD-10-CM | POA: Diagnosis not present

## 2016-06-01 DIAGNOSIS — D259 Leiomyoma of uterus, unspecified: Secondary | ICD-10-CM | POA: Insufficient documentation

## 2016-06-01 DIAGNOSIS — E039 Hypothyroidism, unspecified: Secondary | ICD-10-CM | POA: Insufficient documentation

## 2016-06-01 DIAGNOSIS — M549 Dorsalgia, unspecified: Secondary | ICD-10-CM | POA: Diagnosis present

## 2016-06-01 DIAGNOSIS — R748 Abnormal levels of other serum enzymes: Secondary | ICD-10-CM | POA: Insufficient documentation

## 2016-06-01 DIAGNOSIS — R0789 Other chest pain: Secondary | ICD-10-CM | POA: Diagnosis not present

## 2016-06-01 LAB — CBC
HEMATOCRIT: 35.2 % (ref 35.0–47.0)
Hemoglobin: 11.8 g/dL — ABNORMAL LOW (ref 12.0–16.0)
MCH: 30.2 pg (ref 26.0–34.0)
MCHC: 33.6 g/dL (ref 32.0–36.0)
MCV: 89.7 fL (ref 80.0–100.0)
PLATELETS: 72 10*3/uL — AB (ref 150–440)
RBC: 3.92 MIL/uL (ref 3.80–5.20)
RDW: 14.2 % (ref 11.5–14.5)
WBC: 7.1 10*3/uL (ref 3.6–11.0)

## 2016-06-01 LAB — URINALYSIS COMPLETE WITH MICROSCOPIC (ARMC ONLY)
BILIRUBIN URINE: NEGATIVE
Bacteria, UA: NONE SEEN
GLUCOSE, UA: NEGATIVE mg/dL
HGB URINE DIPSTICK: NEGATIVE
Ketones, ur: NEGATIVE mg/dL
LEUKOCYTES UA: NEGATIVE
NITRITE: NEGATIVE
Protein, ur: NEGATIVE mg/dL
SPECIFIC GRAVITY, URINE: 1.004 — AB (ref 1.005–1.030)
Squamous Epithelial / LPF: NONE SEEN
pH: 5 (ref 5.0–8.0)

## 2016-06-01 LAB — COMPREHENSIVE METABOLIC PANEL
ALK PHOS: 88 U/L (ref 38–126)
ALT: 18 U/L (ref 14–54)
ANION GAP: 7 (ref 5–15)
AST: 13 U/L — ABNORMAL LOW (ref 15–41)
Albumin: 3.2 g/dL — ABNORMAL LOW (ref 3.5–5.0)
BILIRUBIN TOTAL: 0.6 mg/dL (ref 0.3–1.2)
BUN: 30 mg/dL — ABNORMAL HIGH (ref 6–20)
CALCIUM: 8.5 mg/dL — AB (ref 8.9–10.3)
CO2: 23 mmol/L (ref 22–32)
CREATININE: 1.24 mg/dL — AB (ref 0.44–1.00)
Chloride: 113 mmol/L — ABNORMAL HIGH (ref 101–111)
GFR calc non Af Amer: 42 mL/min — ABNORMAL LOW (ref >60)
GFR, EST AFRICAN AMERICAN: 49 mL/min — AB (ref >60)
GLUCOSE: 102 mg/dL — AB (ref 65–99)
Potassium: 4 mmol/L (ref 3.5–5.1)
Sodium: 143 mmol/L (ref 135–145)
TOTAL PROTEIN: 5.8 g/dL — AB (ref 6.5–8.1)

## 2016-06-01 LAB — TROPONIN I
TROPONIN I: 0.07 ng/mL — AB (ref <0.031)
TROPONIN I: 0.03 ng/mL (ref <0.031)
TROPONIN I: 0.06 ng/mL — AB (ref <0.031)

## 2016-06-01 LAB — LIPASE, BLOOD: Lipase: 14 U/L (ref 11–51)

## 2016-06-01 MED ORDER — LEVOTHYROXINE SODIUM 175 MCG PO TABS
175.0000 ug | ORAL_TABLET | Freq: Every day | ORAL | Status: DC
  Administered 2016-06-01 – 2016-06-02 (×2): 175 ug via ORAL
  Filled 2016-06-01: qty 1
  Filled 2016-06-01: qty 3.5
  Filled 2016-06-01: qty 1

## 2016-06-01 MED ORDER — PROCHLORPERAZINE MALEATE 10 MG PO TABS
10.0000 mg | ORAL_TABLET | Freq: Four times a day (QID) | ORAL | Status: DC | PRN
  Administered 2016-06-01: 11:00:00 10 mg via ORAL
  Filled 2016-06-01: qty 1

## 2016-06-01 MED ORDER — SODIUM CHLORIDE 0.9 % IV BOLUS (SEPSIS)
1000.0000 mL | Freq: Once | INTRAVENOUS | Status: AC
  Administered 2016-06-01: 1000 mL via INTRAVENOUS

## 2016-06-01 MED ORDER — CARVEDILOL 12.5 MG PO TABS
12.5000 mg | ORAL_TABLET | Freq: Two times a day (BID) | ORAL | Status: DC
  Administered 2016-06-01 – 2016-06-02 (×3): 12.5 mg via ORAL
  Filled 2016-06-01 (×2): qty 1
  Filled 2016-06-01: qty 2

## 2016-06-01 MED ORDER — LISINOPRIL 10 MG PO TABS
10.0000 mg | ORAL_TABLET | Freq: Once | ORAL | Status: AC
  Administered 2016-06-01: 10 mg via ORAL
  Filled 2016-06-01: qty 1

## 2016-06-01 MED ORDER — PANTOPRAZOLE SODIUM 40 MG PO TBEC
40.0000 mg | DELAYED_RELEASE_TABLET | Freq: Every day | ORAL | Status: DC
  Administered 2016-06-01 – 2016-06-02 (×2): 40 mg via ORAL
  Filled 2016-06-01 (×2): qty 1

## 2016-06-01 MED ORDER — DOXAZOSIN MESYLATE 4 MG PO TABS
8.0000 mg | ORAL_TABLET | Freq: Every day | ORAL | Status: DC
  Administered 2016-06-01: 11:00:00 4 mg via ORAL
  Filled 2016-06-01: qty 1

## 2016-06-01 MED ORDER — ONDANSETRON HCL 4 MG PO TABS
8.0000 mg | ORAL_TABLET | Freq: Two times a day (BID) | ORAL | Status: DC | PRN
  Administered 2016-06-02: 8 mg via ORAL
  Filled 2016-06-01: qty 2

## 2016-06-01 MED ORDER — LORAZEPAM 0.5 MG PO TABS: 0.5000 mg | ORAL_TABLET | Freq: Four times a day (QID) | ORAL | Status: DC | PRN

## 2016-06-01 MED ORDER — LISINOPRIL 10 MG PO TABS: 10.0000 mg | ORAL_TABLET | Freq: Every day | ORAL | Status: DC

## 2016-06-01 MED ORDER — TRAZODONE HCL 100 MG PO TABS
100.0000 mg | ORAL_TABLET | Freq: Every day | ORAL | Status: DC
  Administered 2016-06-01: 21:00:00 100 mg via ORAL
  Filled 2016-06-01: qty 1

## 2016-06-01 MED ORDER — ASPIRIN 81 MG PO CHEW
324.0000 mg | CHEWABLE_TABLET | Freq: Once | ORAL | Status: AC
Start: 2016-06-01 — End: 2016-06-01
  Administered 2016-06-01: 324 mg via ORAL
  Filled 2016-06-01: qty 4

## 2016-06-01 MED ORDER — NITROGLYCERIN 0.4 MG SL SUBL: 0.4000 mg | SUBLINGUAL_TABLET | SUBLINGUAL | Status: DC | PRN

## 2016-06-01 MED ORDER — CLONIDINE HCL 0.1 MG PO TABS
0.1000 mg | ORAL_TABLET | Freq: Three times a day (TID) | ORAL | Status: DC
  Administered 2016-06-01 – 2016-06-02 (×3): 0.1 mg via ORAL
  Filled 2016-06-01 (×3): qty 1

## 2016-06-01 MED ORDER — BUPROPION HCL ER (SR) 100 MG PO TB12
100.0000 mg | ORAL_TABLET | Freq: Two times a day (BID) | ORAL | Status: DC
  Administered 2016-06-01 – 2016-06-02 (×2): 100 mg via ORAL
  Filled 2016-06-01 (×3): qty 1

## 2016-06-01 MED ORDER — CARVEDILOL 6.25 MG PO TABS: 12.5000 mg | ORAL_TABLET | Freq: Two times a day (BID) | ORAL | Status: DC

## 2016-06-01 MED ORDER — FESOTERODINE FUMARATE ER 4 MG PO TB24
4.0000 mg | ORAL_TABLET | Freq: Every day | ORAL | Status: DC
  Administered 2016-06-01 – 2016-06-02 (×2): 4 mg via ORAL
  Filled 2016-06-01 (×2): qty 1

## 2016-06-01 MED ORDER — KETOROLAC TROMETHAMINE 30 MG/ML IJ SOLN
30.0000 mg | Freq: Once | INTRAMUSCULAR | Status: AC
  Administered 2016-06-01: 30 mg via INTRAVENOUS
  Filled 2016-06-01: qty 1

## 2016-06-01 MED ORDER — ROPINIROLE HCL 1 MG PO TABS: 1.0000 mg | ORAL_TABLET | Freq: Every day | ORAL | Status: DC | PRN

## 2016-06-01 MED ORDER — OXYCODONE HCL 5 MG PO TABS
5.0000 mg | ORAL_TABLET | ORAL | Status: DC | PRN
Start: 2016-06-01 — End: 2016-06-02
  Administered 2016-06-01: 22:00:00 5 mg via ORAL
  Filled 2016-06-01: qty 1

## 2016-06-01 MED ORDER — HYDRALAZINE HCL 50 MG PO TABS
100.0000 mg | ORAL_TABLET | Freq: Once | ORAL | Status: DC
  Filled 2016-06-01 (×2): qty 2

## 2016-06-01 MED ORDER — ALPRAZOLAM 0.25 MG PO TABS: 0.2500 mg | ORAL_TABLET | Freq: Three times a day (TID) | ORAL | Status: DC | PRN

## 2016-06-01 MED ORDER — LISINOPRIL 10 MG PO TABS
10.0000 mg | ORAL_TABLET | Freq: Every day | ORAL | Status: DC
  Administered 2016-06-02: 10 mg via ORAL
  Filled 2016-06-01: qty 1

## 2016-06-01 MED ORDER — MORPHINE SULFATE (PF) 4 MG/ML IV SOLN
4.0000 mg | Freq: Once | INTRAVENOUS | Status: AC
  Administered 2016-06-01: 4 mg via INTRAVENOUS
  Filled 2016-06-01: qty 1

## 2016-06-01 MED ORDER — ONDANSETRON HCL 4 MG/2ML IJ SOLN
4.0000 mg | Freq: Once | INTRAMUSCULAR | Status: AC
  Administered 2016-06-01: 4 mg via INTRAVENOUS

## 2016-06-01 MED ORDER — ADULT MULTIVITAMIN W/MINERALS CH
1.0000 | ORAL_TABLET | Freq: Every day | ORAL | Status: DC
  Administered 2016-06-01 – 2016-06-02 (×2): 1 via ORAL
  Filled 2016-06-01 (×2): qty 1

## 2016-06-01 MED ORDER — MORPHINE SULFATE (PF) 4 MG/ML IV SOLN
4.0000 mg | Freq: Once | INTRAVENOUS | Status: AC
Start: 2016-06-01 — End: 2016-06-01
  Administered 2016-06-01: 4 mg via INTRAVENOUS

## 2016-06-01 MED ORDER — DOXAZOSIN MESYLATE 4 MG PO TABS: 4.0000 mg | ORAL_TABLET | Freq: Every day | ORAL | Status: DC

## 2016-06-01 MED ORDER — ACETAMINOPHEN 325 MG PO TABS: 650.0000 mg | ORAL_TABLET | Freq: Four times a day (QID) | ORAL | Status: DC | PRN

## 2016-06-01 MED ORDER — ASPIRIN EC 81 MG PO TBEC: 81.0000 mg | DELAYED_RELEASE_TABLET | Freq: Every day | ORAL | Status: DC

## 2016-06-01 MED ORDER — CLONIDINE HCL 0.1 MG PO TABS
0.1000 mg | ORAL_TABLET | Freq: Once | ORAL | Status: AC
  Administered 2016-06-01: 0.1 mg via ORAL
  Filled 2016-06-01: qty 1

## 2016-06-01 MED ORDER — MORPHINE SULFATE (PF) 2 MG/ML IV SOLN: 2.0000 mg | INTRAVENOUS | Status: DC | PRN

## 2016-06-01 MED ORDER — MIRTAZAPINE 15 MG PO TABS
45.0000 mg | ORAL_TABLET | Freq: Every day | ORAL | Status: DC
  Administered 2016-06-01: 45 mg via ORAL
  Filled 2016-06-01: qty 3

## 2016-06-01 MED ORDER — ATORVASTATIN CALCIUM 20 MG PO TABS: 40.0000 mg | ORAL_TABLET | Freq: Every day | ORAL | Status: DC

## 2016-06-01 MED ORDER — HYDRALAZINE HCL 50 MG PO TABS
100.0000 mg | ORAL_TABLET | Freq: Three times a day (TID) | ORAL | Status: DC
  Administered 2016-06-01 – 2016-06-02 (×4): 100 mg via ORAL
  Filled 2016-06-01 (×3): qty 2

## 2016-06-01 MED ORDER — MORPHINE SULFATE (PF) 4 MG/ML IV SOLN
4.0000 mg | Freq: Once | INTRAVENOUS | Status: AC
  Administered 2016-06-01: 4 mg via INTRAVENOUS

## 2016-06-01 MED ORDER — SODIUM CHLORIDE 0.9% FLUSH
3.0000 mL | Freq: Two times a day (BID) | INTRAVENOUS | Status: DC
  Administered 2016-06-01 – 2016-06-02 (×2): 3 mL via INTRAVENOUS

## 2016-06-01 MED ORDER — ATORVASTATIN CALCIUM 20 MG PO TABS
40.0000 mg | ORAL_TABLET | Freq: Every day | ORAL | Status: DC
  Administered 2016-06-02: 14:00:00 40 mg via ORAL
  Filled 2016-06-01: qty 2

## 2016-06-01 MED ORDER — LIDOCAINE 5 % EX PTCH
1.0000 | MEDICATED_PATCH | CUTANEOUS | Status: DC
  Administered 2016-06-01: 1 via TRANSDERMAL
  Filled 2016-06-01 (×2): qty 1

## 2016-06-01 MED ORDER — MORPHINE SULFATE (PF) 4 MG/ML IV SOLN
INTRAVENOUS | Status: AC
  Filled 2016-06-01: qty 1

## 2016-06-01 MED ORDER — ACETAMINOPHEN 650 MG RE SUPP: 650.0000 mg | Freq: Four times a day (QID) | RECTAL | Status: DC | PRN

## 2016-06-01 MED ORDER — ASPIRIN EC 81 MG PO TBEC
81.0000 mg | DELAYED_RELEASE_TABLET | Freq: Every day | ORAL | Status: DC
  Administered 2016-06-02: 14:00:00 81 mg via ORAL
  Filled 2016-06-01: qty 1

## 2016-06-01 MED ORDER — ONDANSETRON HCL 4 MG/2ML IJ SOLN
INTRAMUSCULAR | Status: AC
  Filled 2016-06-01: qty 2

## 2016-06-01 NOTE — Care Management Obs Status (Deleted)
[facility] NOTIFICATION   Patient Details  Name: Cassidy Ortiz MRN: [id number] Date of Birth: Feb 27, 1941   Medicare Observation Status Notification Given:       Ival Bible, RN 06/01/2016, 10:20 AM

## 2016-06-01 NOTE — H&P (Signed)
[facility] NAME: Cassidy Ortiz    MR#:  [id number]  DATE OF BIRTH:  01/21/1941  DATE OF ADMISSION:  06/01/2016  PRIMARY CARE PHYSICIAN: Rica Mast, MD   REQUESTING/REFERRING PHYSICIAN: Dr Dahlia Client  CHIEF COMPLAINT:   Back pain  HISTORY OF PRESENT ILLNESS:  Cassidy Ortiz  is a 75 y.o. female presented with 2 days of excruciating pain in her back. At first it comes and goes. And now it's more constant. She started chemotherapy 1 week ago on Thursday. She had diarrhea and vomiting on Wednesday and a sore abdomen yesterday. Pain in her lower back graded 10 out of 10 in intensity. The morphine in the ER helped. She is having trouble walking and moving around. Pain when sitting and in every position. In the ER she was found to have a troponin of 0.07 and hospitalist services were contacted for further evaluation. The patient denies any chest pain or shortness of breath. Also the patient's blood pressure was elevated in the emergency room.  PAST MEDICAL HISTORY:   Past Medical History  Diagnosis Date  . Vitamin D deficiency   . Mixed hyperlipidemia   . Anxiety state, unspecified   . Depressive disorder, not elsewhere classified   . Obstructive sleep apnea (adult) (pediatric)   . Hypertensive kidney disease, benign   . Cerebral aneurysm, nonruptured 2008    s/p coil and shunt, performed in [place]  . Duodenal ulcer   . Chronic kidney disease, stage III (moderate)   . Acquired cyst of kidney   . Abnormal weight gain   . Fatty liver   . Diverticulosis of colon (without mention of hemorrhage)   . Personal history of colonic polyps 10/22/2002    hyperplastic   . Hypertension   . Duodenal mass   . Baker's cyst of knee     Left, pt does not remember  . Edema   . Unspecified hypothyroidism   . Grave's disease   . Hypothyroid   . Sleep apnea     off cpap  . Cerebral aneurysm rupture (HCC)     stent  . Radiation 2001     BREAST CA  . Malignant neoplasm of breast (female), unspecified site 2001    right, s/p lumpectomy and XRT  . 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 Breast cancer ([facility]) 2001    RT LUMPECTOMY  . Breast cancer of upper-inner quadrant of left female breast ([facility]) 04/19/2016    PAST SURGICAL HISTORY:   Past Surgical History  Procedure Laterality Date  . Breast lumpectomy Right     right  . Partial nephrectomy      right  . Shoulder surgery      left  . Cataract extraction      bilateral  . Coronary stent placement      brain  . Femoral artery repair  2011  . Csf shunt  08/17/2008  . Hemiarthroplasty shoulder fracture      left  . Vaginal delivery      3  . Upper gastrointestinal endoscopy  11-10-2014  . Colonoscopy      last 2012.+ TA polyps  . Breast biopsy Left 03/14/2016    stereo  . Radioactive seed guided mastectomy with axillary sentinel lymph node biopsy Left 04/19/2016    Procedure: RADIOACTIVE SEED GUIDED PARTIAL MASTECTOMY WITH AXILLARY SENTINEL LYMPH NODE BIOPSY;  Surgeon: Fanny Skates, MD;  Location: [facility];  Service: General;  Laterality: Left;    SOCIAL HISTORY:   Social History  Substance Use Topics  . Smoking status: Former Smoker -- 1.00 packs/day for 43 years    Types: Cigarettes    Start date: 04/01/1964    Quit date: 11/24/2007  . Smokeless tobacco: Never Used     Comment: quit smoking 7 years ago  . Alcohol Use: 0.6 oz/week    1 Glasses of wine per week     Comment: occasssionally    FAMILY HISTORY:   Family History  Problem Relation Age of Onset  . Colon cancer Sister   . Cancer Sister     colon  . Heart disease Father   . Esophageal cancer Neg Hx   . Rectal cancer Neg Hx   . Stomach cancer Neg Hx   . Heart disease Mother     DRUG ALLERGIES:   Allergies  Allergen Reactions  . Amlodipine     Leg swelling    REVIEW OF SYSTEMS:  CONSTITUTIONAL: No fever. Positive for  fatigue.  EYES: No blurred or double vision. Wears glasses. EARS, NOSE, AND THROAT: No tinnitus or ear pain. No sore throat RESPIRATORY: No cough, shortness of breath, wheezing or hemoptysis.  CARDIOVASCULAR: No chest pain, orthopnea, edema.  GASTROINTESTINAL: Some nausea and vomiting. She had diarrhea the other day. Some abdominal pain. No blood in bowel movements GENITOURINARY: No dysuria, hematuria.  ENDOCRINE: No polyuria, nocturia,  HEMATOLOGY: No anemia, easy bruising or bleeding SKIN: No rash or lesion. MUSCULOSKELETAL: Positive for back pain  NEUROLOGIC: No tingling, numbness, weakness.  PSYCHIATRY: No anxiety or depression.   MEDICATIONS AT HOME:   Prior to Admission medications   Medication Sig Start Date End Date Taking? Authorizing Provider  ALPRAZolam (XANAX) 0.25 MG tablet Take 1 tablet (0.25 mg total) by mouth 3 (three) times daily as needed for anxiety. 03/16/15  Yes Jackolyn Confer, MD  aspirin 81 MG tablet Take 81 mg by mouth daily. Takes 2 tablets daily   Yes Historical Provider, MD  atorvastatin (LIPITOR) 40 MG tablet TAKE 1 TABLET DAILY 05/07/16  Yes Jackolyn Confer, MD  buPROPion [facility] SR) 100 MG 12 hr tablet Take 100 mg by mouth 2 (two) times daily.   Yes Historical Provider, MD  carvedilol (COREG) 12.5 MG tablet TAKE 1 TABLET TWICE A DAY  WITH MEALS. 03/25/16  Yes Minna Merritts, MD  cloNIDine (CATAPRES) 0.1 MG tablet Take 1 tablet (0.1 mg total) by mouth 3 (three) times daily. 04/05/16  Yes Minna Merritts, MD  dexamethasone (DECADRON) 4 MG tablet Take 2 tablets by mouth once a day on the day after chemotherapy and then take 2 tablets two times a day for 2 days. Take with food. 05/22/16  Yes Volanda Napoleon, MD  doxazosin (CARDURA) 8 MG tablet TAKE 1 TABLET DAILY 05/07/16  Yes Minna Merritts, MD  Esomeprazole Magnesium (NEXIUM PO) Take 20 mg by mouth every morning.    Yes Historical Provider, MD  fesoterodine (TOVIAZ) 4 MG TB24 tablet Take 4 mg by mouth  daily.   Yes Historical Provider, MD  hydrALAZINE (APRESOLINE) 100 MG tablet TAKE 1 TABLET 3 TIMES A DAY 08/15/15  Yes Minna Merritts, MD  levothyroxine (SYNTHROID) 175 MCG tablet Take 1 tablet (175 mcg total) by mouth daily. 10/05/15  Yes Jackolyn Confer, MD  lidocaine-prilocaine (EMLA) cream Apply to affected area as needed 05/22/16  Yes Volanda Napoleon, MD  lisinopril (PRINIVIL,ZESTRIL)  10 MG tablet Take 10 mg by mouth daily.   Yes Historical Provider, MD  LORazepam (ATIVAN) 0.5 MG tablet Take 1 tablet (0.5 mg total) by mouth every 6 (six) hours as needed (Nausea or vomiting). 05/22/16  Yes Volanda Napoleon, MD  mirtazapine (REMERON) 30 MG tablet Take 45 mg by mouth at bedtime.    Yes Historical Provider, MD  Multiple Vitamin (MULTIVITAMIN) tablet Take 1 tablet by mouth daily.     Yes Historical Provider, MD  nitroGLYCERIN (NITROSTAT) 0.4 MG SL tablet Place 1 tablet (0.4 mg total) under the tongue every 5 (five) minutes as needed for chest pain. 04/05/15  Yes Minna Merritts, MD  ondansetron (ZOFRAN) 8 MG tablet Take 1 tablet (8 mg total) by mouth 2 (two) times daily as needed. Start on the third day after chemotherapy. 05/22/16  Yes Volanda Napoleon, MD  prochlorperazine (COMPAZINE) 10 MG tablet Take 1 tablet (10 mg total) by mouth every 6 (six) hours as needed (Nausea or vomiting). 05/22/16  Yes Volanda Napoleon, MD  rOPINIRole (REQUIP) 1 MG tablet TAKE 1 TABLET (1 MG TOTAL) BY MOUTH DAILY AS NEEDED. 04/29/16  Yes Jackolyn Confer, MD  traZODone (DESYREL) 50 MG tablet Take 100 mg by mouth at bedtime.  02/08/16  Yes Historical Provider, MD      VITAL SIGNS:  Blood pressure 144/57, pulse 68, temperature 98.2 F (36.8 C), temperature source Oral, resp. rate 18, height 5\' 7"  (1.702 m), weight 89.359 kg (197 lb), SpO2 98 %. Blood pressure reading when I saw her in the emergency room was 201/79  PHYSICAL EXAMINATION:  GENERAL:  75 y.o.-year-old patient lying in the bed with no acute distress.  EYES:  Pupils equal, round, reactive to light and accommodation. No scleral icterus. Extraocular muscles intact.  HEENT: Head atraumatic, normocephalic. Oropharynx and nasopharynx clear.  NECK:  Supple, no jugular venous distention. No thyroid enlargement, no tenderness.  LUNGS: Normal breath sounds bilaterally, no wheezing, rales,rhonchi or crepitation. No use of accessory muscles of respiration.  CARDIOVASCULAR: S1, S2 normal. No murmurs, rubs, or gallops.  ABDOMEN: Soft, nontender, nondistended. Bowel sounds present. No organomegaly or mass.  EXTREMITIES: No pedal edema, cyanosis, or clubbing. Pain when palpating over the lower lumbar spine and sacral spine. No pain in the sacroiliac joints. NEUROLOGIC: Cranial nerves II through XII are intact. Muscle strength 5/5 in all extremities. Sensation intact. Gait not checked. Patient able to straight leg raise bilaterally without a problem. PSYCHIATRIC: The patient is alert and oriented x 3.  SKIN: No rash, lesion, or ulcer.   LABORATORY PANEL:   CBC  Recent Labs Lab 06/01/16 0254  WBC 7.1  HGB 11.8*  HCT 35.2  PLT 72*   ------------------------------------------------------------------------------------------------------------------  Chemistries   Recent Labs Lab 06/01/16 0652  NA 143  K 4.0  CL 113*  CO2 23  GLUCOSE 102*  BUN 30*  CREATININE 1.24*  CALCIUM 8.5*  AST 13*  ALT 18  ALKPHOS 88  BILITOT 0.6   ------------------------------------------------------------------------------------------------------------------  Cardiac Enzymes  Recent Labs Lab 06/01/16 1102  TROPONINI 0.03   ------------------------------------------------------------------------------------------------------------------  RADIOLOGY:  Ct Renal Stone Study  06/01/2016  CLINICAL DATA:  Right flank pain. Breast cancer. Recent chemotherapy. EXAM: CT ABDOMEN AND PELVIS WITHOUT CONTRAST TECHNIQUE: Multidetector CT imaging of the abdomen and pelvis was  performed following the standard protocol without IV contrast. COMPARISON:  05/14/2011 CT abdomen/pelvis FINDINGS: Lower chest: No significant pulmonary nodules or acute consolidative airspace disease. Two-vessel coronary atherosclerosis. Surgical clips in  the visualized right breast. Superior approach right subcutaneous chest wall catheter terminates in the anterior superior right peritoneal cavity in the perihepatic space. Hepatobiliary: Hypodense 1.0 cm right liver dome lesion is stable since 05/14/2011, consistent with a benign lesion. No additional liver lesions. Layering subcentimeter gallstone in the gallbladder, with no gallbladder wall thickening or pericholecystic fluid. No biliary ductal dilatation. Pancreas: Normal, with no mass or duct dilation. Spleen: Normal size spleen with stable scattered splenic calcifications, possibly from prior granulomatous disease. No splenic mass. Adrenals/Urinary Tract: Stable 2.0 cm left adrenal adenoma. No right adrenal mass . Exophytic simple 1.8 cm lateral lower left renal cyst. No hydronephrosis. No renal stones. No contour deforming right renal mass. Normal caliber ureters, with no ureteral stones. Relatively collapsed and grossly normal bladder. Stomach/Bowel: Grossly normal stomach. Normal caliber small bowel with no small bowel wall thickening. Normal appendix. Marked sigmoid diverticulosis. Diffuse sigmoid colon wall thickening without pericolonic fat stranding, which appears chronic back to 2012, most consistent with muscular hypertrophy from chronic diverticulosis. Otherwise no colonic wall thickening or pericolonic fat stranding. Vascular/Lymphatic: Atherosclerotic nonaneurysmal abdominal aorta. No pathologically enlarged lymph nodes in the abdomen or pelvis. Reproductive: Mildly enlarged myomatous uterus with 2.2 cm sub serosal left uterine fibroid. No adnexal mass. Other: No pneumoperitoneum. Trace anterior perihepatic fluid surrounding the CSF shunt  catheter. Musculoskeletal: No aggressive appearing focal osseous lesions. Moderate degenerative changes in the visualized thoracolumbar spine. IMPRESSION: 1. No acute abnormality. No urolithiasis. No evidence of urinary tract obstruction. No evidence of bowel obstruction or acute bowel inflammation. Normal appendix. 2. Marked sigmoid diverticulosis with chronic sigmoid wall thickening and no pericolonic fat stranding, most consistent with muscular hypertrophy from chronic diverticulosis. 3. Additional findings include aortic atherosclerosis, coronary atherosclerosis, cholelithiasis, left adrenal adenoma and uterine fibroid. Electronically Signed   By: Ilona Sorrel M.D.   On: 06/01/2016 07:03    EKG:   Normal sinus rhythm 66 bpm, left atrial enlargement, LVH  IMPRESSION AND PLAN:   1. Elevated troponin without chest pain or shortness of breath. Get serial cardiac enzymes monitor on telemetry. Aspirin only. 2. Accelerated hypertension. Restart patient's blood pressure medications and monitor. Pain can also cause accelerated hypertension. Controlling pain will also help. 3. Acute back pain. I will obtain an MRI of the lumbosacral spine. Physical therapy evaluation. Pain control with oral and IV medications 4. Graves disease. Continue thyroid replacement 5. Sleep apnea 6. Thrombocytopenia likely from recent chemotherapy 7. Depression. No suicidal or homicidal ideation. Continue psychiatric medications 8. History of breast cancer undergoing chemotherapy as outpatient 9. Chronic kidney disease stage III. Continue to monitor as outpatient 10. Likely discharge home tomorrow.  All the records are reviewed and case discussed with ED provider. Management plans discussed with the patient, family and they are in agreement.  CODE STATUS: Full code  TOTAL TIME TAKING CARE OF THIS PATIENT: 50 minutes.    Loletha Grayer M.D on 06/01/2016 at 3:22 PM, patient was seen at 9 AM.  Between 7am to 6pm - Pager  - [phone]  After 6pm call admission pager [phone]  [facility]  [phone]  CC: Primary care physician; Dr. Ronette Deter

## 2016-06-01 NOTE — Care Management Obs Status (Signed)
[facility] NOTIFICATION   Patient Details  Name: Cassidy Ortiz MRN: [id number] Date of Birth: [birth year]   Medicare Observation Status Notification Given:   yes     Ival Bible, RN 06/01/2016, 10:21 AM

## 2016-06-01 NOTE — ED Notes (Signed)
Patient reports chest pain, back pain and side pain.  Reports that she started a chemo medication on Wednesday and on Thursday she had diarrhea and vomiting and thought at first she was just sore, but now the pain is worse.

## 2016-06-01 NOTE — ED Provider Notes (Signed)
[facility] Emergency Department Provider Note   ____________________________________________  Time seen: Approximately 333 AM  I have reviewed the triage vital signs and the nursing notes.   HISTORY  Chief Complaint Chest Pain and Back Pain    HPI Cassidy Ortiz is a 75 y.o. female comes into the hospital today with back pain. The patient reports that she started having some back pain on Thursday and it radiates into her right side. She also has some mild upper abdominal pain. The patient reports that the pain started Thursday with continued into Friday. The patient started chemotherapy for recurrence of her breast cancer one week ago. She reports that she had diarrhea and thought that maybe she was having some soreness in her back and belly from that. She reports that she's never had it this intense before in tonight it was very bad. Currently the patient's pain is a 5 out of 10 in intensity. She denies any dark urine or blood in her urine. She has some mid upper abdominal pain and right flank pain. She denies any shortness of breath, dizziness, fever, difficulty breathing, chest pain. The patient is here tonight for evaluation and treatment of her pain and symptoms.   Past Medical History  Diagnosis Date  . Vitamin D deficiency   . Mixed hyperlipidemia   . Anxiety state, unspecified   . Depressive disorder, not elsewhere classified   . Obstructive sleep apnea (adult) (pediatric)   . Hypertensive kidney disease, benign   . Cerebral aneurysm, nonruptured 2008    s/p coil and shunt, performed in [place]  . Duodenal ulcer   . Chronic kidney disease, stage III (moderate)   . Acquired cyst of kidney   . Abnormal weight gain   . Fatty liver   . Diverticulosis of colon (without mention of hemorrhage)   . Personal history of colonic polyps 10/22/2002    hyperplastic   . Hypertension   . Duodenal mass   . Baker's cyst of knee     Left, pt does not remember  .  Edema   . Unspecified hypothyroidism   . Grave's disease   . Hypothyroid   . Sleep apnea     off cpap  . Cerebral aneurysm rupture (HCC)     stent  . Radiation 2001    BREAST CA  . Malignant neoplasm of breast (female), unspecified site 2001    right, s/p lumpectomy and XRT  . 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 Breast cancer ([place]) 2001    RT LUMPECTOMY  . Breast cancer of upper-inner quadrant of left female breast ([place]) 04/19/2016    Patient Active Problem List   Diagnosis Date Noted  . Breast cancer of upper-inner quadrant of left female breast ([place]) 04/19/2016  . 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 Acute cystitis without hematuria 03/12/2016  . Osteopenia 02/20/2016  . Abnormal mammogram of left breast 02/20/2016  . Radiation   . Rosacea 12/29/2015  . Mixed stress and urge urinary incontinence 11/28/2015  . OSA (obstructive sleep apnea) 06/29/2015  . Major depressive disorder, recurrent, severe without psychotic features ([place]) 03/16/2015  . Obesity (BMI 30-39.9) 08/18/2014  . Hyperlipidemia 01/05/2014  . Insomnia 10/15/2013  . Anemia 03/24/2013  . Medicare annual wellness visit, subsequent 12/21/2012  . Cerebral aneurysm, nonruptured 11/12/2012  . Obstructive hydrocephalus 11/12/2012  . Hemorrhage into subarachnoid space of neuraxis ([place]) 11/12/2012  . Cerebral aneurysm 10/30/2012  .  S/P VP shunt 10/30/2012  . Hypothyroidism 10/23/2012  . Acute diastolic CHF (congestive heart failure) ([place]) 05/18/2012  . 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 Fatty liver 05/28/2011  . Benign neoplasm of colon 05/13/2011  . Mass of colon 05/13/2011  . Duodenal mass 05/13/2011  . GERD (gastroesophageal reflux disease) 05/13/2011  . Family history of malignant neoplasm of gastrointestinal tract 05/02/2011  . Hypertension 05/02/2011  . History of kidney cancer 05/02/2011  . Breast cancer ([place]) 05/02/2011  .  MURMUR 09/13/2009  . GRAVES' DISEASE 09/12/2009  . MIGRAINE HEADACHE 09/12/2009    Past Surgical History  Procedure Laterality Date  . Breast lumpectomy Right     right  . Partial nephrectomy      right  . Shoulder surgery      left  . Cataract extraction      bilateral  . Coronary stent placement      brain  . Femoral artery repair  2011  . Csf shunt  08/17/2008  . Hemiarthroplasty shoulder fracture      left  . Vaginal delivery      3  . Upper gastrointestinal endoscopy  11-10-2014  . Colonoscopy      last 2012.+ TA polyps  . Breast biopsy Left 03/14/2016    stereo  . Radioactive seed guided mastectomy with axillary sentinel lymph node biopsy Left 04/19/2016    Procedure: RADIOACTIVE SEED GUIDED PARTIAL MASTECTOMY WITH AXILLARY SENTINEL LYMPH NODE BIOPSY;  Surgeon: Fanny Skates, MD;  Location: [facility];  Service: General;  Laterality: Left;    Current Outpatient Rx  Name  Route  Sig  Dispense  Refill  . ALPRAZolam (XANAX) 0.25 MG tablet   Oral   Take 1 tablet (0.25 mg total) by mouth 3 (three) times daily as needed for anxiety.   60 tablet   4   . aspirin 81 MG tablet   Oral   Take 81 mg by mouth daily. Takes 2 tablets daily         . atorvastatin (LIPITOR) 40 MG tablet      TAKE 1 TABLET DAILY   90 tablet   3   . buPROPion (WELLBUTRIN SR) 100 MG 12 hr tablet   Oral   Take 100 mg by mouth 2 (two) times daily.         . carvedilol (COREG) 12.5 MG tablet      TAKE 1 TABLET TWICE A DAY  WITH MEALS.   180 tablet   3   . cloNIDine (CATAPRES) 0.1 MG tablet   Oral   Take 1 tablet (0.1 mg total) by mouth 3 (three) times daily.   270 tablet   3   . dexamethasone (DECADRON) 4 MG tablet      Take 2 tablets by mouth once a day on the day after chemotherapy and then take 2 tablets two times a day for 2 days. Take with food.   30 tablet   1   . doxazosin (CARDURA) 8 MG tablet      TAKE 1 TABLET DAILY   90 tablet   3   . Esomeprazole  Magnesium (NEXIUM PO)   Oral   Take 20 mg by mouth every morning.          . fesoterodine (TOVIAZ) 4 MG TB24 tablet   Oral   Take 4 mg by mouth daily.         . hydrALAZINE (APRESOLINE) 100 MG tablet  TAKE 1 TABLET 3 TIMES A DAY   270 tablet   3   . levothyroxine (SYNTHROID) 175 MCG tablet   Oral   Take 1 tablet (175 mcg total) by mouth daily.   90 tablet   3   . lidocaine-prilocaine (EMLA) cream      Apply to affected area as needed   30 g   3   . lisinopril (PRINIVIL,ZESTRIL) 10 MG tablet   Oral   Take 10 mg by mouth daily.         Marland Kitchen LORazepam (ATIVAN) 0.5 MG tablet   Oral   Take 1 tablet (0.5 mg total) by mouth every 6 (six) hours as needed (Nausea or vomiting).   30 tablet   0   . mirtazapine (REMERON) 30 MG tablet   Oral   Take 45 mg by mouth at bedtime.          . Multiple Vitamin (MULTIVITAMIN) tablet   Oral   Take 1 tablet by mouth daily.           . nitroGLYCERIN (NITROSTAT) 0.4 MG SL tablet   Sublingual   Place 1 tablet (0.4 mg total) under the tongue every 5 (five) minutes as needed for chest pain.   25 tablet   3   . ondansetron (ZOFRAN) 8 MG tablet   Oral   Take 1 tablet (8 mg total) by mouth 2 (two) times daily as needed. Start on the third day after chemotherapy.   30 tablet   1   . prochlorperazine (COMPAZINE) 10 MG tablet   Oral   Take 1 tablet (10 mg total) by mouth every 6 (six) hours as needed (Nausea or vomiting).   30 tablet   1   . rOPINIRole (REQUIP) 1 MG tablet      TAKE 1 TABLET (1 MG TOTAL) BY MOUTH DAILY AS NEEDED.   90 tablet   1   . traZODone (DESYREL) 50 MG tablet   Oral   Take 100 mg by mouth at bedtime.       3     Allergies Amlodipine  Family History  Problem Relation Age of Onset  . Colon cancer Sister   . Cancer Sister     colon  . Heart disease Father   . Esophageal cancer Neg Hx   . Rectal cancer Neg Hx   . Stomach cancer Neg Hx     Social History Social History  Substance  Use Topics  . Smoking status: Former Smoker -- 1.00 packs/day for 43 years    Types: Cigarettes    Start date: 04/01/1964    Quit date: 11/24/2007  . Smokeless tobacco: Never Used     Comment: quit smoking 7 years ago  . Alcohol Use: 0.6 oz/week    1 Glasses of wine per week     Comment: occasssionally    Review of Systems Constitutional: No fever/chills Eyes: No visual changes. ENT: No sore throat. Cardiovascular: Denies chest pain. Respiratory: Denies shortness of breath. Gastrointestinal:  abdominal pain, diarrhea.  No constipation. Genitourinary: Negative for dysuria. Musculoskeletal:  back pain. Skin: Negative for rash. Neurological: Negative for headaches, focal weakness or numbness.  10-point ROS otherwise negative.  ____________________________________________   PHYSICAL EXAM:  VITAL SIGNS: ED Triage Vitals  Enc Vitals Group     BP 06/01/16 0341 156/83 mmHg     Pulse Rate 06/01/16 0341 57     Resp 06/01/16 0341 18     Temp --  Temp src --      SpO2 06/01/16 0341 95 %     Weight --      Height --      Head Cir --      Peak Flow --      Pain Score 06/01/16 0302 10     Pain Loc --      Pain Edu? --      Excl. in [place]? --     Constitutional: Alert and oriented. Well appearing and in Mild distress. Eyes: Conjunctivae are normal. PERRL. EOMI. Head: Atraumatic. Nose: No congestion/rhinnorhea. Mouth/Throat: Mucous membranes are moist.  Oropharynx non-erythematous. Cardiovascular: Normal rate, regular rhythm. Grossly normal heart sounds.  Good peripheral circulation. Respiratory: Normal respiratory effort.  No retractions. Lungs CTAB. No right CVA tenderness to palpation Gastrointestinal: Soft and nontender. No distention. Positive bowel sounds Musculoskeletal: Mild tenderness to palpation midline lower L-spine to sacral spine..   Neurologic:  Normal speech and language.  Skin:  Skin is warm, dry and intact.  Psychiatric: Mood and affect are normal.    ____________________________________________   LABS (all labs ordered are listed, but only abnormal results are displayed)  Labs Reviewed  CBC - Abnormal; Notable for the following:    Hemoglobin 11.8 (*)    Platelets 72 (*)    All other components within normal limits  URINALYSIS COMPLETEWITH MICROSCOPIC ([facility] ONLY) - Abnormal; Notable for the following:    Color, Urine COLORLESS (*)    APPearance CLEAR (*)    Specific Gravity, Urine 1.004 (*)    All other components within normal limits  COMPREHENSIVE METABOLIC PANEL - Abnormal; Notable for the following:    Chloride 113 (*)    Glucose, Bld 102 (*)    BUN 30 (*)    Creatinine, Ser 1.24 (*)    Calcium 8.5 (*)    Total Protein 5.8 (*)    Albumin 3.2 (*)    AST 13 (*)    GFR calc non Af Amer 42 (*)    GFR calc Af Amer 49 (*)    All other components within normal limits  TROPONIN I - Abnormal; Notable for the following:    Troponin I 0.07 (*)    All other components within normal limits  LIPASE, BLOOD   ____________________________________________  EKG  ED ECG REPORT I, Loney Hering, the attending physician, personally viewed and interpreted this ECG.   Date: 06/01/2016  EKG Time: 305  Rate: 66  Rhythm: normal sinus rhythm  Axis: normal  Intervals:none  ST&T Change: flipped t wave avl  ____________________________________________  RADIOLOGY  CT renal stone study: No acute abnormality, no urolithiasis, no evidence of urinary tract obstruction, no evidence of bowel obstruction or acute bowel inflammation, normal appendix. Marked sigmoid diverticulosis with chronic sigmoid wall thickening and no pericolonic fat stranding, most consistent with muscular hypertrophy from chronic diverticulosis, aortic atherosclerosis, coronary atherosclerosis, cholelithiasis, left adrenal adenoma and uterine fibroid. ____________________________________________   PROCEDURES  Procedure(s) performed: None  Critical Care  performed: No  ____________________________________________   INITIAL IMPRESSION / ASSESSMENT AND PLAN / ED COURSE  Pertinent labs & imaging results that were available during my care of the patient were reviewed by me and considered in my medical decision making (see chart for details).  This is a 75 year old female who is undergoing treatment for cancer at this time. She is having some back pain. I will give the patient a dose of morphine as well as a liter of normal saline. There  is a concern for possible kidney stone but also for possible metastatic disease to the patient's back. I will do a CT scan of the patient's abdomen and pelvis looking at her back as well as looking at her kidneys.  The patient did receive 2 more doses of morphine as well as some Toradol. The patient is still continuing to complain of pain. When I received the results of her troponin was elevated at 0.07. Given the patient's pain as well as her elevated troponin I feel that it would be appropriate to admit her to the hospital. I will give her some medications for her blood pressure, carvedilol, hydralazine, lisinopril, clonidine and aspirin. She will be admitted. ____________________________________________   FINAL CLINICAL IMPRESSION(S) / ED DIAGNOSES  Final diagnoses:  Midline low back pain without sciatica  Chest pain, unspecified chest pain type      NEW MEDICATIONS STARTED DURING THIS VISIT:  New Prescriptions   No medications on file     Note:  This document was prepared using Dragon voice recognition software and may include unintentional dictation errors.    Loney Hering, MD 06/01/16 [phone]

## 2016-06-01 NOTE — ED Notes (Signed)
Patient given crackers and ginger ale. 

## 2016-06-02 ENCOUNTER — Observation Stay: Payer: Medicare Other

## 2016-06-02 DIAGNOSIS — R748 Abnormal levels of other serum enzymes: Secondary | ICD-10-CM | POA: Diagnosis not present

## 2016-06-02 DIAGNOSIS — I10 Essential (primary) hypertension: Secondary | ICD-10-CM | POA: Diagnosis not present

## 2016-06-02 DIAGNOSIS — E0500 Thyrotoxicosis with diffuse goiter without thyrotoxic crisis or storm: Secondary | ICD-10-CM | POA: Diagnosis not present

## 2016-06-02 DIAGNOSIS — M545 Low back pain: Secondary | ICD-10-CM | POA: Diagnosis not present

## 2016-06-02 LAB — BASIC METABOLIC PANEL
Anion gap: 6 (ref 5–15)
BUN: 23 mg/dL — ABNORMAL HIGH (ref 6–20)
CALCIUM: 8.6 mg/dL — AB (ref 8.9–10.3)
CO2: 24 mmol/L (ref 22–32)
CREATININE: 1.35 mg/dL — AB (ref 0.44–1.00)
Chloride: 111 mmol/L (ref 101–111)
GFR, EST AFRICAN AMERICAN: 44 mL/min — AB (ref >60)
GFR, EST NON AFRICAN AMERICAN: 38 mL/min — AB (ref >60)
Glucose, Bld: 113 mg/dL — ABNORMAL HIGH (ref 65–99)
Potassium: 4.2 mmol/L (ref 3.5–5.1)
SODIUM: 141 mmol/L (ref 135–145)

## 2016-06-02 LAB — CBC
HCT: 34 % — ABNORMAL LOW (ref 35.0–47.0)
Hemoglobin: 11.5 g/dL — ABNORMAL LOW (ref 12.0–16.0)
MCH: 30.6 pg (ref 26.0–34.0)
MCHC: 33.9 g/dL (ref 32.0–36.0)
MCV: 90.2 fL (ref 80.0–100.0)
PLATELETS: 74 10*3/uL — AB (ref 150–440)
RBC: 3.77 MIL/uL — AB (ref 3.80–5.20)
RDW: 15 % — ABNORMAL HIGH (ref 11.5–14.5)
WBC: 10 10*3/uL (ref 3.6–11.0)

## 2016-06-02 MED ORDER — DOXAZOSIN MESYLATE 8 MG PO TABS: ORAL_TABLET | ORAL | Status: DC

## 2016-06-02 MED ORDER — LIDOCAINE 5 % EX PTCH: 1.0000 | MEDICATED_PATCH | CUTANEOUS | Status: DC

## 2016-06-02 NOTE — Discharge Summary (Signed)
[facility] NAME: Cassidy Ortiz    MR#:  [id number]  DATE OF BIRTH:  [birth year]  DATE OF ADMISSION:  06/01/2016 ADMITTING PHYSICIAN: Loletha Grayer, MD  DATE OF DISCHARGE: 06/02/2016  2:00 PM  PRIMARY CARE PHYSICIAN: Rica Mast, MD    ADMISSION DIAGNOSIS:  Low back pain  DISCHARGE DIAGNOSIS:  Active Problems:   Elevated troponin   SECONDARY DIAGNOSIS:   Past Medical History  Diagnosis Date  . Vitamin D deficiency   . Mixed hyperlipidemia   . Anxiety state, unspecified   . Depressive disorder, not elsewhere classified   . Obstructive sleep apnea (adult) (pediatric)   . Hypertensive kidney disease, benign   . Cerebral aneurysm, nonruptured 2008    s/p coil and shunt, performed in [place]  . Duodenal ulcer   . Chronic kidney disease, stage III (moderate)   . Acquired cyst of kidney   . Abnormal weight gain   . Fatty liver   . Diverticulosis of colon (without mention of hemorrhage)   . Personal history of colonic polyps 10/22/2002    hyperplastic   . Hypertension   . Duodenal mass   . Baker's cyst of knee     Left, pt does not remember  . Edema   . Unspecified hypothyroidism   . Grave's disease   . Hypothyroid   . Sleep apnea     off cpap  . Cerebral aneurysm rupture (HCC)     stent  . Radiation 2001    BREAST CA  . Malignant neoplasm of breast (female), unspecified site 2001    right, s/p lumpectomy and XRT  . 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 Breast cancer ([facility]) 2001    RT LUMPECTOMY  . Breast cancer of upper-inner quadrant of left female breast ([place]) 04/19/2016    HOSPITAL COURSE:   1.  Acute low back pain. This was very severe on presentation bringing her to the ER. Patient was given multiple rounds of IV medication in the ER. A Lidoderm patch was placed and then her pain went away. On the day of discharge she was pain free. I did prescribe a Lidoderm  patch just in case this pain comes back. Her MRI of the lumbar spine was incomplete but did not show anything that would cause her pain to be this severe. 2. Accelerated hypertension. This was secondary to severe pain on presentation. Blood pressure much improved. 3. Elevated troponin. Patient did not have chest pain or shortness of breath I believe this is secondary to the accelerated hypertension. No further cardiac workup done in the hospital. 4. History of breast cancer follow-up with oncology as outpatient 5. Graves' disease. Continue levothyroxine 6. History of sleep apnea 7. Depression. Continued psychiatric medications 8. Thrombocytopenia likely from recent chemotherapy 9. Chronic kidney disease stage III   DISCHARGE CONDITIONS:   Satisfactory  CONSULTS OBTAINED:  None  DRUG ALLERGIES:   Allergies  Allergen Reactions  . Amlodipine     Leg swelling    DISCHARGE MEDICATIONS:   Discharge Medication List as of 06/02/2016  1:20 PM    START taking these medications   Details  lidocaine (LIDODERM) 5 % Place 1 patch onto the skin daily. Remove & Discard patch within 12 hours or as directed by MD, Starting 06/02/2016, Until Discontinued, Print      CONTINUE these medications which have CHANGED   Details  doxazosin (CARDURA) 8 MG  tablet TAKE 0.5 TABLET DAILY, No Print      CONTINUE these medications which have NOT CHANGED   Details  ALPRAZolam (XANAX) 0.25 MG tablet Take 1 tablet (0.25 mg total) by mouth 3 (three) times daily as needed for anxiety., Starting 03/16/2015, Until Discontinued, Print    aspirin 81 MG tablet Take 81 mg by mouth daily. Takes 2 tablets daily, Until Discontinued, Historical Med    atorvastatin (LIPITOR) 40 MG tablet TAKE 1 TABLET DAILY, Normal    buPROPion (WELLBUTRIN SR) 100 MG 12 hr tablet Take 100 mg by mouth 2 (two) times daily., Until Discontinued, Historical Med    carvedilol (COREG) 12.5 MG tablet TAKE 1 TABLET TWICE A DAY  WITH MEALS.,  Normal    cloNIDine (CATAPRES) 0.1 MG tablet Take 1 tablet (0.1 mg total) by mouth 3 (three) times daily., Starting 04/05/2016, Until Discontinued, Normal    dexamethasone (DECADRON) 4 MG tablet Take 2 tablets by mouth once a day on the day after chemotherapy and then take 2 tablets two times a day for 2 days. Take with food., Normal    Esomeprazole Magnesium (NEXIUM PO) Take 20 mg by mouth every morning. , Until Discontinued, Historical Med    fesoterodine (TOVIAZ) 4 MG TB24 tablet Take 4 mg by mouth daily., Until Discontinued, Historical Med    hydrALAZINE (APRESOLINE) 100 MG tablet TAKE 1 TABLET 3 TIMES A DAY, Normal    levothyroxine (SYNTHROID) 175 MCG tablet Take 1 tablet (175 mcg total) by mouth daily., Starting 10/05/2015, Until Discontinued, Normal    lidocaine-prilocaine (EMLA) cream Apply to affected area as needed, Normal    lisinopril (PRINIVIL,ZESTRIL) 10 MG tablet Take 10 mg by mouth daily., Until Discontinued, Historical Med    LORazepam (ATIVAN) 0.5 MG tablet Take 1 tablet (0.5 mg total) by mouth every 6 (six) hours as needed (Nausea or vomiting)., Starting 05/22/2016, Until Discontinued, Print    mirtazapine (REMERON) 30 MG tablet Take 45 mg by mouth at bedtime. , Until Discontinued, Historical Med    Multiple Vitamin (MULTIVITAMIN) tablet Take 1 tablet by mouth daily.  , Until Discontinued, Historical Med    nitroGLYCERIN (NITROSTAT) 0.4 MG SL tablet Place 1 tablet (0.4 mg total) under the tongue every 5 (five) minutes as needed for chest pain., Starting 04/05/2015, Until Discontinued, Normal    ondansetron (ZOFRAN) 8 MG tablet Take 1 tablet (8 mg total) by mouth 2 (two) times daily as needed. Start on the third day after chemotherapy., Starting 05/22/2016, Until Discontinued, Normal    prochlorperazine (COMPAZINE) 10 MG tablet Take 1 tablet (10 mg total) by mouth every 6 (six) hours as needed (Nausea or vomiting)., Starting 05/22/2016, Until Discontinued, Normal     rOPINIRole (REQUIP) 1 MG tablet TAKE 1 TABLET (1 MG TOTAL) BY MOUTH DAILY AS NEEDED., Normal    traZODone (DESYREL) 50 MG tablet Take 100 mg by mouth at bedtime. , Starting 02/08/2016, Until Discontinued, Historical Med         DISCHARGE INSTRUCTIONS:   Follow-up PMD one week  If you experience worsening of your admission symptoms, develop shortness of breath, life threatening emergency, suicidal or homicidal thoughts you must seek medical attention immediately by calling 911 or calling your MD immediately  if symptoms less severe.  You Must read complete instructions/literature along with all the possible adverse reactions/side effects for all the Medicines you take and that have been prescribed to you. Take any new Medicines after you have completely understood and accept all the possible adverse  reactions/side effects.   Please note  You were cared for by a hospitalist during your hospital stay. If you have any questions about your discharge medications or the care you received while you were in the hospital after you are discharged, you can call the unit and asked to speak with the hospitalist on call if the hospitalist that took care of you is not available. Once you are discharged, your primary care physician will handle any further medical issues. Please note that NO REFILLS for any discharge medications will be authorized once you are discharged, as it is imperative that you return to your primary care physician (or establish a relationship with a primary care physician if you do not have one) for your aftercare needs so that they can reassess your need for medications and monitor your lab values.    Today   CHIEF COMPLAINT:   Low back pain  HISTORY OF PRESENT ILLNESS:  Cassidy Ortiz  is a 75 y.o. female came to the ER with severe low back pain and found to have a borderline troponin and elevated blood pressure   VITAL SIGNS:  Blood pressure 142/69, pulse 65, temperature 98.3  F (36.8 C), temperature source Oral, resp. rate 18, height 5\' 7"  (1.702 m), weight 89.359 kg (197 lb), SpO2 93 %.    PHYSICAL EXAMINATION:  GENERAL:  75 y.o.-year-old patient lying in the bed with no acute distress.  EYES: Pupils equal, round, reactive to light and accommodation. No scleral icterus. Extraocular muscles intact.  HEENT: Head atraumatic, normocephalic. Oropharynx and nasopharynx clear.  NECK:  Supple, no jugular venous distention. No thyroid enlargement, no tenderness.  LUNGS: Normal breath sounds bilaterally, no wheezing, rales,rhonchi or crepitation. No use of accessory muscles of respiration.  CARDIOVASCULAR: S1, S2 normal. No murmurs, rubs, or gallops.  ABDOMEN: Soft, non-tender, non-distended. Bowel sounds present. No organomegaly or mass.  EXTREMITIES: No pedal edema, cyanosis, or clubbing. No pain to palpation over lumbar or sacral spine. NEUROLOGIC: Cranial nerves II through XII are intact. Muscle strength 5/5 in all extremities. Sensation intact. Gait not checked. Able to straight leg raise without problems PSYCHIATRIC: The patient is alert and oriented x 3.  SKIN: No obvious rash, lesion, or ulcer.   DATA REVIEW:   CBC  Recent Labs Lab 06/02/16 0429  WBC 10.0  HGB 11.5*  HCT 34.0*  PLT 74*    Chemistries   Recent Labs Lab 06/01/16 0652 06/02/16 0429  NA 143 141  K 4.0 4.2  CL 113* 111  CO2 23 24  GLUCOSE 102* 113*  BUN 30* 23*  CREATININE 1.24* 1.35*  CALCIUM 8.5* 8.6*  AST 13*  --   ALT 18  --   ALKPHOS 88  --   BILITOT 0.6  --     Cardiac Enzymes  Recent Labs Lab 06/01/16 1449  TROPONINI 0.06*      RADIOLOGY:  Mr Lumbar Spine Wo Contrast  06/02/2016  CLINICAL DATA:  Low back pain. No reported radicular complaints. History of breast cancer. EXAM: MRI LUMBAR SPINE WITHOUT CONTRAST TECHNIQUE: Multiplanar, multisequence MR imaging of the lumbar spine was performed. No intravenous contrast was administered. COMPARISON:  CT abdomen  and pelvis 06/01/2016 FINDINGS: The examination had to be discontinued prior to completion due to patient claustrophobia. Sagittal T1, T2, and STIR sequences were obtained. No axial imaging was performed. Segmentation:  Standard. Alignment: Mild right convex curvature of the lumbar spine. No significant listhesis. Vertebrae: Preserved vertebral body heights without evidence of fracture. Hemangiomas  in the L5 and S2 vertebrae. No destructive osseous lesions identified. Conus medullaris: Extends to the superior L1 level and appears normal. Paraspinal and other soft tissues: Prominent subcutaneous edema in the low back. Disc levels: Detailed assessment of degenerative changes limited by the lack of axial imaging. There is relatively diffuse lumbar disc desiccation. There is mild disc space narrowing at L4-5 and L5-S1 with preserved disc space heights elsewhere. There is no evidence of significant spinal stenosis. The thecal sac is effaced at the L5-S1 level by prominent epidural fat. There is moderate facet arthrosis in the lower lumbar spine. At L5-S1, asymmetric right-sided disc space height loss, disc bulging, endplate spurring, and facet arthrosis result in severe right and moderate left neural foraminal stenosis. At L4-5, disc bulging and facet arthrosis result in mild left neural foraminal stenosis. IMPRESSION: 1. Incomplete examination as above.  No spinal stenosis. 2. Lower lumbar disc and facet degeneration, most notable at L5-S1 where there is severe right and moderate left neural foraminal stenosis. These results were called by telephone at the time of interpretation on 06/02/2016 at 12:30 pm to Dr. Loletha Grayer , who verbally acknowledged these results. Electronically Signed   By: Logan Bores M.D.   On: 06/02/2016 14:54   Ct Renal Stone Study  06/01/2016  CLINICAL DATA:  Right flank pain. Breast cancer. Recent chemotherapy. EXAM: CT ABDOMEN AND PELVIS WITHOUT CONTRAST TECHNIQUE: Multidetector CT  imaging of the abdomen and pelvis was performed following the standard protocol without IV contrast. COMPARISON:  05/14/2011 CT abdomen/pelvis FINDINGS: Lower chest: No significant pulmonary nodules or acute consolidative airspace disease. Two-vessel coronary atherosclerosis. Surgical clips in the visualized right breast. Superior approach right subcutaneous chest wall catheter terminates in the anterior superior right peritoneal cavity in the perihepatic space. Hepatobiliary: Hypodense 1.0 cm right liver dome lesion is stable since 05/14/2011, consistent with a benign lesion. No additional liver lesions. Layering subcentimeter gallstone in the gallbladder, with no gallbladder wall thickening or pericholecystic fluid. No biliary ductal dilatation. Pancreas: Normal, with no mass or duct dilation. Spleen: Normal size spleen with stable scattered splenic calcifications, possibly from prior granulomatous disease. No splenic mass. Adrenals/Urinary Tract: Stable 2.0 cm left adrenal adenoma. No right adrenal mass . Exophytic simple 1.8 cm lateral lower left renal cyst. No hydronephrosis. No renal stones. No contour deforming right renal mass. Normal caliber ureters, with no ureteral stones. Relatively collapsed and grossly normal bladder. Stomach/Bowel: Grossly normal stomach. Normal caliber small bowel with no small bowel wall thickening. Normal appendix. Marked sigmoid diverticulosis. Diffuse sigmoid colon wall thickening without pericolonic fat stranding, which appears chronic back to 2012, most consistent with muscular hypertrophy from chronic diverticulosis. Otherwise no colonic wall thickening or pericolonic fat stranding. Vascular/Lymphatic: Atherosclerotic nonaneurysmal abdominal aorta. No pathologically enlarged lymph nodes in the abdomen or pelvis. Reproductive: Mildly enlarged myomatous uterus with 2.2 cm sub serosal left uterine fibroid. No adnexal mass. Other: No pneumoperitoneum. Trace anterior perihepatic  fluid surrounding the CSF shunt catheter. Musculoskeletal: No aggressive appearing focal osseous lesions. Moderate degenerative changes in the visualized thoracolumbar spine. IMPRESSION: 1. No acute abnormality. No urolithiasis. No evidence of urinary tract obstruction. No evidence of bowel obstruction or acute bowel inflammation. Normal appendix. 2. Marked sigmoid diverticulosis with chronic sigmoid wall thickening and no pericolonic fat stranding, most consistent with muscular hypertrophy from chronic diverticulosis. 3. Additional findings include aortic atherosclerosis, coronary atherosclerosis, cholelithiasis, left adrenal adenoma and uterine fibroid. Electronically Signed   By: Ilona Sorrel M.D.   On: 06/01/2016 07:03  Management plans discussed with the patient, family and they are in agreement.  CODE STATUS:     Code Status Orders        Start     Ordered   06/01/16 0919  Full code   Continuous     06/01/16 0919    Code Status History    Date Active Date Inactive Code Status Order ID Comments User Context   This patient has a current code status but no historical code status.      TOTAL TIME TAKING CARE OF THIS PATIENT: 35 minutes.    Loletha Grayer M.D on 06/02/2016 at 4:33 PM  Between 7am to 6pm - Pager - [phone]  After 6pm go to [email or web address] - password [email or web address]  [facility] Office  [phone]  CC: Primary care physician; Rica Mast, MD

## 2016-06-02 NOTE — Evaluation (Signed)
Physical Therapy Evaluation Patient Details Name: Cassidy Ortiz MRN: [id number] DOB: [birth year] Today's Date: 06/02/2016   History of Present Illness  Cassidy Ortiz is a 75yo white female who comes to [facility] after acute insidious onset of 10/10 central low back pain near the lumbosacral junction. PMH positive for brain aneurysm, OSA, and BrCA chemo started last Thursday. The patien experienced  a day of nausea and emesis after chemo began and then back pain began the next day. At baseline, pt limits AMB to household distances with occasional access to the community, due to limitations imposed by chronic vascular pathology (per husband).   Clinical Impression  Pt received immediately after she is AMB hallway with husband. All functional mobility is performed at baseline, with no acute abnormality per patient and confirmed by husband. Palpation demonstrates some R lumbar paraspinal hypertonicity/spasm but is not painful. Pt reports no pain at this time, but that pain was central and lumbosacral, but with large area of distribution. She denies any associated pain into the buttocks, thighs, knees, ankles, feet. She denies burning sensation, paresthesia, or numbness. She denies throbbing. She reports that pain was constant regardless of postural changes. The patient reports good fluid replenishment after multiple episodes of emesis and diarrhea, but no focus on electrolyte replacement.   At this time, the patients report offers no clear indication as to the origination of this pain. Presentation is not consistent with what is typical of facet joint arthropathy, discopathy, radiculopathy, or vertebral fracture. I suspect it may be related to electrolyte imbalance and/or muscle strain after repeated emesis/bowel movements, however etiology remains unclear. Pt presenting at baseline without acute impairment. Recommend FU with OPPT for help with lumbar paraspinal spasm. All PT services can be met at the next venue of  care.       Follow Up Recommendations      Equipment Recommendations       Recommendations for Other Services       Precautions / Restrictions        Mobility  Bed Mobility                  Transfers Overall transfer level: Independent Equipment used: None                Ambulation/Gait Ambulation/Gait assistance:  (Pt ambulating hallway with husband upon arrival. ) Ambulation Distance (Feet): 50 Feet (88f obseevred, but AMB around the unit with husband prior to arrival)         General Gait Details: mild intermittent lateral hip instability noted. (scissoring, etc.)   Stairs            Wheelchair Mobility    Modified Rankin (Stroke Patients Only)       Balance Overall balance assessment: Modified Independent                                           Pertinent Vitals/Pain Pain Assessment: No/denies pain (palpable muscle hypertonicicty on R paraspinals, but not painful per patient. )    Home Living Family/patient expects to be discharged to:: Private residence Living Arrangements: Spouse/significant other Available Help at Discharge: Family Type of Home: House Home Access: Level entry     Home Layout: One level Home Equipment: None      Prior Function Level of Independence: Independent  Hand Dominance        Extremity/Trunk Assessment                         Communication   Communication: No difficulties  Cognition Arousal/Alertness: Awake/alert Behavior During Therapy: WFL for tasks assessed/performed Overall Cognitive Status: Within Functional Limits for tasks assessed                      General Comments      Exercises        Assessment/Plan    PT Assessment All further PT needs can be met in the next venue of care  PT Diagnosis Abnormality of gait;Other (comment) (postural abnormalities)   PT Problem List Decreased range of motion;Decreased  activity tolerance  PT Treatment Interventions     PT Goals (Current goals can be found in the Care Plan section) Acute Rehab PT Goals PT Goal Formulation: All assessment and education complete, DC therapy    Frequency     Barriers to discharge        Co-evaluation               End of Session   Activity Tolerance: Patient tolerated treatment well Patient left: in bed;with family/visitor present;with call bell/phone within reach (seated EOB. )      Functional Limitation: Mobility: Walking and moving around Mobility: Walking and Moving Around Current Status (U2725): At least 1 percent but less than 20 percent impaired, limited or restricted Mobility: Walking and Moving Around Goal Status [phone]): At least 1 percent but less than 20 percent impaired, limited or restricted Mobility: Walking and Moving Around Discharge Status [phone]): At least 1 percent but less than 20 percent impaired, limited or restricted    Time: 1137-1200 PT Time Calculation (min) (ACUTE ONLY): 23 min   Charges:   PT Evaluation $PT Eval Moderate Complexity: 1 Procedure     PT G Codes:   PT G-Codes **NOT FOR INPATIENT CLASS** Functional Limitation: Mobility: Walking and moving around Mobility: Walking and Moving Around Current Status (Q5956): At least 1 percent but less than 20 percent impaired, limited or restricted Mobility: Walking and Moving Around Goal Status [phone]): At least 1 percent but less than 20 percent impaired, limited or restricted Mobility: Walking and Moving Around Discharge Status [phone]): At least 1 percent but less than 20 percent impaired, limited or restricted    12:49 PM, 06/02/2016 Etta Grandchild, PT, DPT Physical Therapist - [place] [phone] (mobile)

## 2016-06-02 NOTE — Discharge Instructions (Signed)

## 2016-06-02 NOTE — Plan of Care (Signed)
Problem: Pain Managment: Goal: General experience of comfort will improve Outcome: Progressing Pt verbalized that she felt the best pain relief with the Lidoderm patch within the past 24 hours.

## 2016-06-02 NOTE — Progress Notes (Signed)
Received MD order to discharge patient to home reviewed discharge instructions home meds and prescriptions with patient and patient verbalized understanding discharged in wheelchair with nursing to home

## 2016-06-03 MED ORDER — TRAZODONE HCL 50 MG PO TABS: 100.0000 mg | ORAL_TABLET | Freq: Every day | ORAL | Status: DC

## 2016-06-04 ENCOUNTER — Telehealth: Payer: Self-pay

## 2016-06-04 NOTE — Telephone Encounter (Signed)
Transition Care Management Follow-up Telephone Call   Date discharged? 06/02/16   How have you been since you were released from the hospital? OK.  LOW BACK PAIN IS PRESENT BUT NOT AS BAD.  N/V/D FROM MEDICATION HAS SUBSIDED.  EATING/DRINKING OKAY.   Do you understand why you were in the hospital? YES, I WAS HAVING SEVERE LOW BACK PAIN.    Do you understand the discharge instructions? YES, TO WALK AS MUCH AS POSSIBLE AND DRINK PLENTY OF FLUIDS.   Where were you discharged to? Home.   Items Reviewed:  Medications reviewed: YES, TAKING ALL MEDICATIONS AS DIRECTED.  Allergies reviewed: YES, AMLODIPINE.  Dietary changes reviewed: YES, REGULAR DIET, NO RESTRICTIONS.  Referrals reviewed: YES, APPOINTMENT SCHEDULED FOR CHEMOTHERAPY.  HOSPITAL FOLLOW UP WITH PCP.   Functional Questionnaire:   Activities of Daily Living (ADLs):   She states they are independent in the following: INDEPENDENT IN ALL ADLS. States they require assistance with the following: DOES NOT REQUIRE ASSISTANCE AT THIS TIME.   Any transportation issues/concerns?: NO.   Any patient concerns? NONE.   Confirmed importance and date/time of follow-up visits scheduled YES, appointment to be scheduled.  Will call back to confirm a day that works for her.  Provider Appointment to be booked with Dr. Gilford Rile (PCP).  Confirmed with patient if condition begins to worsen call PCP or go to the ER.  Patient was given the office number and encouraged to call back with question or concerns.  : YES, VERBALIZED UNDERSTANDING.

## 2016-06-06 ENCOUNTER — Ambulatory Visit (INDEPENDENT_AMBULATORY_CARE_PROVIDER_SITE_OTHER): Payer: Medicare Other | Admitting: Internal Medicine

## 2016-06-06 ENCOUNTER — Encounter: Payer: Self-pay | Admitting: Internal Medicine

## 2016-06-06 VITALS — BP 138/82 | HR 64 | Ht 67.0 in | Wt 194.8 lb

## 2016-06-06 DIAGNOSIS — M545 Low back pain: Secondary | ICD-10-CM | POA: Diagnosis not present

## 2016-06-06 DIAGNOSIS — C50212 Malignant neoplasm of upper-inner quadrant of left female breast: Secondary | ICD-10-CM

## 2016-06-06 MED ORDER — LIDOCAINE 5 % EX PTCH: 1.0000 | MEDICATED_PATCH | CUTANEOUS | Status: DC

## 2016-06-06 NOTE — Patient Instructions (Signed)
Continue Lidoderm patch.  Follow up in 2 weeks.

## 2016-06-06 NOTE — Assessment & Plan Note (Signed)
Reviewed ER notes and hospitalization. MRI lumbar spine showed no acute cause of pain however imaging was limited by lack of axial view. Encouraged her to continue prn Lidoderm. Follow up in 2-3 weeks.

## 2016-06-06 NOTE — Progress Notes (Signed)
Subjective:    Patient ID: Cassidy Ortiz, female    DOB: 1941-05-03, 75 y.o.   MRN: [id number]  HPI  75YO female presents for hospital follow up.  ADMITTED: 06/01/2016 DISCHARGED: 06/02/2016  DIAGNOSIS: Low back pain and elevated troponin.  Presented to the ER with severe low back pain. MRI lumbar spine showed no acute cause of pain, however disc bulge and foraminal stenosis at L5-S1 was noted. Pain improved with IV pain medication and lidoderm. She was noted to have elevated troponin on labs in ER, likely related to accelerated hypertension.  She also recently underwent a left mastectomy in 04/2016. Started chemotherapy with Adriamycin and Cytoxan 6/15. Noted to have some diarrhea with this.  Since discharge, back pain has been improved. Not currently using Lidoderm. Waiting on insurance approval. Feels that she is tolerating chemo well. No further nausea/vomiting. Next cycle 7/6. Planning to get hats this week, as her hair is falling out.   Wt Readings from Last 3 Encounters:  06/06/16 194 lb 12.8 oz (88.361 kg)  06/01/16 197 lb (89.359 kg)  05/22/16 193 lb 8 oz (87.771 kg)   BP Readings from Last 3 Encounters:  06/06/16 138/82  06/02/16 142/69  05/23/16 151/61    Past Medical History  Diagnosis Date  . Vitamin D deficiency   . Mixed hyperlipidemia   . Anxiety state, unspecified   . Depressive disorder, not elsewhere classified   . Obstructive sleep apnea (adult) (pediatric)   . Hypertensive kidney disease, benign   . Cerebral aneurysm, nonruptured 2008    s/p coil and shunt, performed in [place]  . Duodenal ulcer   . Chronic kidney disease, stage III (moderate)   . Acquired cyst of kidney   . Abnormal weight gain   . Fatty liver   . Diverticulosis of colon (without mention of hemorrhage)   . Personal history of colonic polyps 10/22/2002    hyperplastic   . Hypertension   . Duodenal mass   . Baker's cyst of knee     Left, pt does not remember  . Edema   .  Unspecified hypothyroidism   . Grave's disease   . Hypothyroid   . Sleep apnea     off cpap  . Cerebral aneurysm rupture (HCC)     stent  . Radiation 2001    BREAST CA  . Malignant neoplasm of breast (female), unspecified site 2001    right, s/p lumpectomy and XRT  . 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 Breast cancer ([facility]) 2001    RT LUMPECTOMY  . Breast cancer of upper-inner quadrant of left female breast ([facility]) 04/19/2016   Family History  Problem Relation Age of Onset  . Colon cancer Sister   . Cancer Sister     colon  . Heart disease Father   . Esophageal cancer Neg Hx   . Rectal cancer Neg Hx   . Stomach cancer Neg Hx   . Heart disease Mother    Past Surgical History  Procedure Laterality Date  . Breast lumpectomy Right     right  . Partial nephrectomy      right  . Shoulder surgery      left  . Cataract extraction      bilateral  . Coronary stent placement      brain  . Femoral artery repair  2011  . Csf shunt  08/17/2008  . Hemiarthroplasty shoulder fracture  left  . Vaginal delivery      3  . Upper gastrointestinal endoscopy  11-10-2014  . Colonoscopy      last 2012.+ TA polyps  . Breast biopsy Left 03/14/2016    stereo  . Radioactive seed guided mastectomy with axillary sentinel lymph node biopsy Left 04/19/2016    Procedure: RADIOACTIVE SEED GUIDED PARTIAL MASTECTOMY WITH AXILLARY SENTINEL LYMPH NODE BIOPSY;  Surgeon: Fanny Skates, MD;  Location: [facility];  Service: General;  Laterality: Left;   Social History   Social History  . Marital Status: Married    Spouse Name: N/A  . Number of Children: 3  . Years of Education: N/A   Occupational History  . retired    Social History Main Topics  . Smoking status: Former Smoker -- 1.00 packs/day for 43 years    Types: Cigarettes    Start date: 04/01/1964    Quit date: 11/24/2007  . Smokeless tobacco: Never Used     Comment: quit smoking 7  years ago  . Alcohol Use: 0.6 oz/week    1 Glasses of wine per week     Comment: occasssionally  . Drug Use: No  . Sexual Activity: Not Currently   Other Topics Concern  . None   Social History Narrative   Lives in [place] with husband. From [place]. Children live [place] and [place].   Pets - 1 cat in home.      Work - retired Theme park manager, Educational psychologist   Diet - regular, limited calories          Review of Systems  Constitutional: Negative for fever, chills, appetite change, fatigue and unexpected weight change.  Eyes: Negative for visual disturbance.  Respiratory: Negative for cough, shortness of breath and wheezing.   Cardiovascular: Negative for chest pain and leg swelling.  Gastrointestinal: Negative for nausea, vomiting, abdominal pain, diarrhea and constipation.  Musculoskeletal: Positive for myalgias, back pain and arthralgias.  Skin: Negative for color change and rash.  Hematological: Negative for adenopathy. Does not bruise/bleed easily.  Psychiatric/Behavioral: Positive for sleep disturbance. Negative for suicidal ideas and dysphoric mood. The patient is not nervous/anxious.        Objective:    BP 138/82 mmHg  Pulse 64  Ht 5\' 7"  (1.702 m)  Wt 194 lb 12.8 oz (88.361 kg)  BMI 30.50 kg/m2  SpO2 93% Physical Exam  Constitutional: She is oriented to person, place, and time. She appears well-developed and well-nourished. No distress.  HENT:  Head: Normocephalic and atraumatic.  Right Ear: External ear normal.  Left Ear: External ear normal.  Nose: Nose normal.  Mouth/Throat: Oropharynx is clear and moist.  Eyes: Conjunctivae are normal. Pupils are equal, round, and reactive to light. Right eye exhibits no discharge. Left eye exhibits no discharge. No scleral icterus.  Neck: Normal range of motion. Neck supple. No tracheal deviation present. No thyromegaly present.  Cardiovascular: Normal rate, regular rhythm, normal heart sounds and intact distal pulses.  Exam reveals no gallop and  no friction rub.   No murmur heard. Pulmonary/Chest: Effort normal and breath sounds normal. No respiratory distress. She has no wheezes. She has no rales. She exhibits no tenderness.  Musculoskeletal: Normal range of motion. She exhibits no edema or tenderness.  Lymphadenopathy:    She has no cervical adenopathy.  Neurological: She is alert and oriented to person, place, and time. No cranial nerve deficit. She exhibits normal muscle tone. Coordination normal.  Skin: Skin is warm and dry. No rash noted.  She is not diaphoretic. No erythema. No pallor.  Psychiatric: She has a normal mood and affect. Her behavior is normal. Judgment and thought content normal.          Assessment & Plan:   Problem List Items Addressed This Visit      Unprioritized   Breast cancer of upper-inner quadrant of left female breast ([facility]) - Primary    Reviewed recent notes from Oncology. Completed 1st course of chemotherapy. Some NVD now resolved. Offered support today. Follow up in 2-3 weeks.      Low back pain    Reviewed ER notes and hospitalization. MRI lumbar spine showed no acute cause of pain however imaging was limited by lack of axial view. Encouraged her to continue prn Lidoderm. Follow up in 2-3 weeks.          Return in about 2 weeks (around 06/20/2016) for Recheck.  Ronette Deter, MD Internal Medicine [facility]

## 2016-06-06 NOTE — Progress Notes (Signed)
Pre visit review using our clinic review tool, if applicable. No additional management support is needed unless otherwise documented below in the visit note. 

## 2016-06-06 NOTE — Assessment & Plan Note (Signed)
Reviewed recent notes from Oncology. Completed 1st course of chemotherapy. Some NVD now resolved. Offered support today. Follow up in 2-3 weeks.

## 2016-06-10 ENCOUNTER — Telehealth: Payer: Self-pay

## 2016-06-10 NOTE — Telephone Encounter (Signed)
Completed PA for Lidoderm patches, faxed to [facility]. thanks

## 2016-06-10 NOTE — Telephone Encounter (Signed)
PA approved from 06/10/2016-06/10/2017. thanks

## 2016-06-12 DIAGNOSIS — N183 Chronic kidney disease, stage 3 (moderate): Secondary | ICD-10-CM | POA: Diagnosis not present

## 2016-06-12 DIAGNOSIS — I129 Hypertensive chronic kidney disease with stage 1 through stage 4 chronic kidney disease, or unspecified chronic kidney disease: Secondary | ICD-10-CM | POA: Diagnosis not present

## 2016-06-12 DIAGNOSIS — D631 Anemia in chronic kidney disease: Secondary | ICD-10-CM | POA: Diagnosis not present

## 2016-06-12 DIAGNOSIS — R809 Proteinuria, unspecified: Secondary | ICD-10-CM | POA: Diagnosis not present

## 2016-06-13 ENCOUNTER — Ambulatory Visit (HOSPITAL_BASED_OUTPATIENT_CLINIC_OR_DEPARTMENT_OTHER): Payer: Medicare Other

## 2016-06-13 ENCOUNTER — Other Ambulatory Visit (HOSPITAL_BASED_OUTPATIENT_CLINIC_OR_DEPARTMENT_OTHER): Payer: Medicare Other

## 2016-06-13 ENCOUNTER — Ambulatory Visit (HOSPITAL_BASED_OUTPATIENT_CLINIC_OR_DEPARTMENT_OTHER): Payer: Medicare Other | Admitting: Hematology & Oncology

## 2016-06-13 VITALS — BP 136/57 | HR 57 | Temp 98.1°F | Resp 20 | Wt 196.0 lb

## 2016-06-13 DIAGNOSIS — Z85528 Personal history of other malignant neoplasm of kidney: Secondary | ICD-10-CM

## 2016-06-13 DIAGNOSIS — Z853 Personal history of malignant neoplasm of breast: Secondary | ICD-10-CM

## 2016-06-13 DIAGNOSIS — Z170 Estrogen receptor positive status [ER+]: Secondary | ICD-10-CM | POA: Diagnosis not present

## 2016-06-13 DIAGNOSIS — Z5111 Encounter for antineoplastic chemotherapy: Secondary | ICD-10-CM | POA: Diagnosis not present

## 2016-06-13 DIAGNOSIS — C50912 Malignant neoplasm of unspecified site of left female breast: Secondary | ICD-10-CM

## 2016-06-13 DIAGNOSIS — C50212 Malignant neoplasm of upper-inner quadrant of left female breast: Secondary | ICD-10-CM

## 2016-06-13 DIAGNOSIS — Z5189 Encounter for other specified aftercare: Secondary | ICD-10-CM | POA: Diagnosis not present

## 2016-06-13 LAB — CBC WITH DIFFERENTIAL (CANCER CENTER ONLY)
BASO#: 0.1 10*3/uL (ref 0.0–0.2)
BASO%: 1.1 % (ref 0.0–2.0)
EOS ABS: 0 10*3/uL (ref 0.0–0.5)
EOS%: 0.4 % (ref 0.0–7.0)
HEMATOCRIT: 34.7 % — AB (ref 34.8–46.6)
HGB: 11.8 g/dL (ref 11.6–15.9)
LYMPH#: 0.7 10*3/uL — AB (ref 0.9–3.3)
LYMPH%: 10.4 % — ABNORMAL LOW (ref 14.0–48.0)
MCH: 31.1 pg (ref 26.0–34.0)
MCHC: 34 g/dL (ref 32.0–36.0)
MCV: 92 fL (ref 81–101)
MONO#: 0.6 10*3/uL (ref 0.1–0.9)
MONO%: 8.5 % (ref 0.0–13.0)
NEUT#: 5.7 10*3/uL (ref 1.5–6.5)
NEUT%: 79.6 % (ref 39.6–80.0)
Platelets: 246 10*3/uL (ref 145–400)
RBC: 3.79 10*6/uL (ref 3.70–5.32)
RDW: 15.7 % (ref 11.1–15.7)
WBC: 7.1 10*3/uL (ref 3.9–10.0)

## 2016-06-13 LAB — CMP (CANCER CENTER ONLY)
ALK PHOS: 103 U/L — AB (ref 26–84)
ALT: 20 U/L (ref 10–47)
AST: 20 U/L (ref 11–38)
Albumin: 3.3 g/dL (ref 3.3–5.5)
BUN, Bld: 28 mg/dL — ABNORMAL HIGH (ref 7–22)
CALCIUM: 9.5 mg/dL (ref 8.0–10.3)
CO2: 28 mEq/L (ref 18–33)
Chloride: 105 mEq/L (ref 98–108)
Creat: 1.5 mg/dl — ABNORMAL HIGH (ref 0.6–1.2)
GLUCOSE: 133 mg/dL — AB (ref 73–118)
POTASSIUM: 4.1 meq/L (ref 3.3–4.7)
Sodium: 140 mEq/L (ref 128–145)
Total Bilirubin: 0.5 mg/dl (ref 0.20–1.60)
Total Protein: 6.1 g/dL — ABNORMAL LOW (ref 6.4–8.1)

## 2016-06-13 MED ORDER — SODIUM CHLORIDE 0.9 % IV SOLN
Freq: Once | INTRAVENOUS | Status: AC
  Administered 2016-06-13: 10:00:00 via INTRAVENOUS
  Filled 2016-06-13: qty 5

## 2016-06-13 MED ORDER — SODIUM CHLORIDE 0.9 % IV SOLN
Freq: Once | INTRAVENOUS | Status: AC
  Administered 2016-06-13: 10:00:00 via INTRAVENOUS

## 2016-06-13 MED ORDER — PALONOSETRON HCL INJECTION 0.25 MG/5ML
INTRAVENOUS | Status: AC
Start: 2016-06-13 — End: 2016-06-13
  Filled 2016-06-13: qty 5

## 2016-06-13 MED ORDER — PALONOSETRON HCL INJECTION 0.25 MG/5ML
0.2500 mg | Freq: Once | INTRAVENOUS | Status: AC
  Administered 2016-06-13: 0.25 mg via INTRAVENOUS

## 2016-06-13 MED ORDER — SODIUM CHLORIDE 0.9 % IV SOLN
600.0000 mg/m2 | Freq: Once | INTRAVENOUS | Status: AC
  Administered 2016-06-13: 1220 mg via INTRAVENOUS
  Filled 2016-06-13: qty 61

## 2016-06-13 MED ORDER — HEPARIN SOD (PORK) LOCK FLUSH 100 UNIT/ML IV SOLN
500.0000 [IU] | Freq: Once | INTRAVENOUS | Status: AC | PRN
  Administered 2016-06-13: 500 [IU]
  Filled 2016-06-13: qty 5

## 2016-06-13 MED ORDER — PEGFILGRASTIM 6 MG/0.6ML ~~LOC~~ PSKT
PREFILLED_SYRINGE | SUBCUTANEOUS | Status: AC
Start: 2016-06-13 — End: 2016-06-13
  Filled 2016-06-13: qty 0.6

## 2016-06-13 MED ORDER — DOXORUBICIN HCL CHEMO IV INJECTION 2 MG/ML
48.0000 mg/m2 | Freq: Once | INTRAVENOUS | Status: AC
  Administered 2016-06-13: 98 mg via INTRAVENOUS
  Filled 2016-06-13: qty 49

## 2016-06-13 MED ORDER — SODIUM CHLORIDE 0.9% FLUSH
10.0000 mL | INTRAVENOUS | Status: DC | PRN
  Administered 2016-06-13: 10 mL
  Filled 2016-06-13: qty 10

## 2016-06-13 MED ORDER — PEGFILGRASTIM 6 MG/0.6ML ~~LOC~~ PSKT
6.0000 mg | PREFILLED_SYRINGE | Freq: Once | SUBCUTANEOUS | Status: AC
  Administered 2016-06-13: 6 mg via SUBCUTANEOUS

## 2016-06-13 NOTE — Patient Instructions (Addendum)
[facility] Discharge Instructions for Patients Receiving Chemotherapy  Today you received the following chemotherapy agents Adriamycin and Cytoxan  To help prevent nausea and vomiting after your treatment, we encourage you to take your nausea medication   1) Zofran (Ondansetron) 8 mg twice a day for 3 days beginning  on Sunday for 3 days.  Take one in the morning and one in the evening to help prevent nausea   2) Decadron (Dexamethasone)  Take 2 tablets by mouth once a day beginning on Sunday for 3 days.  Take with food.  3) Compazine (Prochlorperazine) Take 10 mg every 6 hours as needed for nausea.  4) Ativan  (Lorazepam) Take 1 tablet every 6 hours as needed for nausea and vomiting.  If you develop nausea and vomiting that is not controlled by your nausea medication, call the clinic. If it is after clinic hours your family physician or the after hours number for the clinic or go to the Emergency Department.   BELOW ARE SYMPTOMS THAT SHOULD BE REPORTED IMMEDIATELY:  *FEVER GREATER THAN 100.5 F  *CHILLS WITH OR WITHOUT FEVER  NAUSEA AND VOMITING THAT IS NOT CONTROLLED WITH YOUR NAUSEA MEDICATION  *UNUSUAL SHORTNESS OF BREATH  *UNUSUAL BRUISING OR BLEEDING  TENDERNESS IN MOUTH AND THROAT WITH OR WITHOUT PRESENCE OF ULCERS  *URINARY PROBLEMS  *BOWEL PROBLEMS  UNUSUAL RASH Items with * indicate a potential emergency and should be followed up as soon as possible.  One of the nurses will contact you 24 hours after your treatment. Please let the nurse know about any problems that you may have experienced. Feel free to call the clinic [phone]   I have been informed and understand all the instructions given to me. I know to contact the clinic, my physician, or go to the Emergency Department if any problems should occur. I do not have any questions at this time, but understand that I may call the clinic during office hours   should I have any questions or need  assistance in obtaining follow up care.    __________________________________________  _____________  __________ Signature of Patient or Authorized Representative            Date                   Time    __________________________________________ Nurse's Signature

## 2016-06-13 NOTE — Progress Notes (Signed)
Hematology and Oncology Follow Up Visit  Cassidy Ortiz [id number] 04-17-1941 75 y.o. 06/13/2016   Principle Diagnosis:  1. 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 2. 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 3. Intermittent iron-deficiency anemia. 4.  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  Profit is back for followup. She tolerated her first cycle of chemotherapy pretty well. About a week afterwards, she had a bout of vomiting. She had diarrhea before hand. She may have had some kind of an intestinal virus. She try to have MRI but cannot because of claustrophobia. Apparently, a Lidoderm patch seemed to help when anything else.  I don't of this was related to the chemotherapy. I want to make sure that she takes the oral anti-emetics as scheduled. I will make sure we write down how she takes the Zofran and Decadron after treatment.   She also here about 2 weeks after treatment. This really does not bother her that much.   She has had no cough or shortness of breath. She has had no rashes. She did not have any mouth sores.   She is quite pleased with herself as to how well she is done so far.   Overall, her performance status is ECOG 1.    Medications:  Current outpatient prescriptions:  .  ALPRAZolam (XANAX) 0.25 MG tablet, Take 1 tablet (0.25 mg total) by mouth 3 (three) times daily as needed for anxiety., Disp: 60 tablet, Rfl: 4 .  aspirin 81 MG tablet, Take 81 mg by mouth daily. Takes 2 tablets daily, Disp: , Rfl:  .  atorvastatin (LIPITOR) 40 MG tablet, TAKE 1 TABLET DAILY, Disp: 90 tablet, Rfl: 3 .  buPROPion (WELLBUTRIN SR) 100 MG 12 hr tablet, Take 100 mg by mouth 2 (two) times daily., Disp: , Rfl:  .  carvedilol (COREG) 12.5 MG tablet, TAKE 1 TABLET TWICE A DAY  WITH MEALS., Disp: 180 tablet, Rfl: 3 .  cloNIDine  (CATAPRES) 0.1 MG tablet, Take 1 tablet (0.1 mg total) by mouth 3 (three) times daily., Disp: 270 tablet, Rfl: 3 .  dexamethasone (DECADRON) 4 MG tablet, Take 2 tablets by mouth once a day on the day after chemotherapy and then take 2 tablets two times a day for 2 days. Take with food., Disp: 30 tablet, Rfl: 1 .  doxazosin (CARDURA) 8 MG tablet, TAKE 0.5 TABLET DAILY, Disp: 90 tablet, Rfl: 3 .  Esomeprazole Magnesium (NEXIUM PO), Take 20 mg by mouth every morning. , Disp: , Rfl:  .  fesoterodine (TOVIAZ) 4 MG TB24 tablet, Take 4 mg by mouth daily., Disp: , Rfl:  .  hydrALAZINE (APRESOLINE) 100 MG tablet, TAKE 1 TABLET 3 TIMES A DAY, Disp: 270 tablet, Rfl: 3 .  levothyroxine (SYNTHROID) 175 MCG tablet, Take 1 tablet (175 mcg total) by mouth daily., Disp: 90 tablet, Rfl: 3 .  lidocaine (LIDODERM) 5 %, Place 1 patch onto the skin daily. Leave for 12hr, Disp: 30 patch, Rfl: 6 .  lidocaine-prilocaine (EMLA) cream, Apply to affected area as needed, Disp: 30 g, Rfl: 3 .  lisinopril (PRINIVIL,ZESTRIL) 10 MG tablet, Take 10 mg by mouth daily., Disp: , Rfl:  .  LORazepam (ATIVAN) 0.5 MG tablet, Take 1 tablet (0.5 mg total) by mouth every 6 (six) hours as  needed (Nausea or vomiting)., Disp: 30 tablet, Rfl: 0 .  mirtazapine (REMERON SOL-TAB) 45 MG disintegrating tablet, Take 45 mg by mouth at bedtime., Disp: , Rfl:  .  Multiple Vitamin (MULTIVITAMIN) tablet, Take 1 tablet by mouth daily.  , Disp: , Rfl:  .  nitroGLYCERIN (NITROSTAT) 0.4 MG SL tablet, Place 1 tablet (0.4 mg total) under the tongue every 5 (five) minutes as needed for chest pain., Disp: 25 tablet, Rfl: 3 .  ondansetron (ZOFRAN) 8 MG tablet, Take 1 tablet (8 mg total) by mouth 2 (two) times daily as needed. Start on the third day after chemotherapy., Disp: 30 tablet, Rfl: 1 .  prochlorperazine (COMPAZINE) 10 MG tablet, Take 1 tablet (10 mg total) by mouth every 6 (six) hours as needed (Nausea or vomiting)., Disp: 30 tablet, Rfl: 1 .  rOPINIRole  (REQUIP) 1 MG tablet, TAKE 1 TABLET (1 MG TOTAL) BY MOUTH DAILY AS NEEDED., Disp: 90 tablet, Rfl: 1 .  traZODone (DESYREL) 50 MG tablet, Take 2 tablets (100 mg total) by mouth at bedtime., Disp: 180 tablet, Rfl: 1 No current facility-administered medications for this visit.  Facility-Administered Medications Ordered in Other Visits:  .  0.9 %  sodium chloride infusion, , Intravenous, Continuous, Volanda Napoleon, MD, Stopped at 04/20/15 1651  Allergies:  Allergies  Allergen Reactions  . Amlodipine     Leg swelling    Past Medical History, Surgical history, Social history, and Family History were reviewed and updated.  Review of Systems: As above  Physical Exam:  weight is 196 lb (88.905 kg). Her oral temperature is 98.1 F (36.7 C). Her blood pressure is 136/57 and her pulse is 57. Her respiration is 20.   Well-developed and well-nourished white female. Head and neck exam shows no ocular or oral lesions. She has chemotherapy-induced alopecia. She has no palpable cervical or supraclavicular lymph nodes. Lungs are clear. Cardiac exam regular in rhythm with no murmurs rubs or bruits. Abdomen is soft. She is good bowel sounds. There is no fluid wave. There is no guarding or rebound tenderness. There is no palpable liver or spleen tip.extremities shows the compression stockings bilaterally. She has minimal edema in her legs. No erythema is noted. She good range of motion and strength. Skin exam no rashes. Neurological exam is non-focal. Breast exam shows left breast  with a healing lumpectomy scar at about 11:00 position. There is some bruising noted. No distinct masses noted in the left breast. She has the healing left axillary sentinel node biopsy scar. Right breast shows contraction of the tissue. She has the nipple inversion which is chronic. She has some radiation changes. She has no obvious mass in the right breast. There is no right axillary adenopathy.   Lab Results  Component Value Date    WBC 7.1 06/13/2016   HGB 11.8 06/13/2016   HCT 34.7* 06/13/2016   MCV 92 06/13/2016   PLT 246 06/13/2016     Chemistry      Component Value Date/Time   NA 140 06/13/2016 0827   NA 141 06/02/2016 0429   NA 144 05/16/2016 0901   NA 144 04/03/2015 1427   NA 142 07/06/2012 1153   K 4.1 06/13/2016 0827   K 4.2 06/02/2016 0429   K 4.3 05/16/2016 0901   K 3.9 04/03/2015 1427   CL 105 06/13/2016 0827   CL 111 06/02/2016 0429   CL 111 04/03/2015 1427   CO2 28 06/13/2016 0827   CO2 24 06/02/2016 0429  CO2 25 05/16/2016 0901   CO2 24 04/03/2015 1427   BUN 28* 06/13/2016 0827   BUN 23* 06/02/2016 0429   BUN 31.8* 05/16/2016 0901   BUN 31* 04/03/2015 1427   BUN 24 07/06/2012 1153   CREATININE 1.5* 06/13/2016 0827   CREATININE 1.35* 06/02/2016 0429   CREATININE 1.4* 05/16/2016 0901   CREATININE 1.49* 04/03/2015 1427      Component Value Date/Time   CALCIUM 9.5 06/13/2016 0827   CALCIUM 8.6* 06/02/2016 0429   CALCIUM 9.7 05/16/2016 0901   CALCIUM 9.4 04/03/2015 1427   ALKPHOS 103* 06/13/2016 0827   ALKPHOS 88 06/01/2016 0652   ALKPHOS 86 05/16/2016 0901   AST 20 06/13/2016 0827   AST 13* 06/01/2016 0652   AST 17 05/16/2016 0901   ALT 20 06/13/2016 0827   ALT 18 06/01/2016 0652   ALT 16 05/16/2016 0901   BILITOT 0.50 06/13/2016 0827   BILITOT 0.6 06/01/2016 0652   BILITOT 0.37 05/16/2016 0901         Impression and Plan: Ms. Cech is 75 year old female. 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 She has a past history of right renal cell carcinoma.  She now has a new invasive ductal carcinoma of the left breast. Because of the high Oncotype score, we really need to embark upon hemotherapy.  She has had 1 of 4 cycles of treatment.  Again, I'm not sure if this episode of vomiting a week after treatment was from the chemotherapy. However, we will be aggressive and make sure that she is on an oral anti-medic program after treatment.  We  will go ahead and plan to get her back to see [place] in another 3 weeks for her third cycle.   Volanda Napoleon, MD 7/6/20179:29 AM

## 2016-06-22 ENCOUNTER — Encounter: Payer: Self-pay | Admitting: *Deleted

## 2016-06-22 ENCOUNTER — Inpatient Hospital Stay
Admission: EM | Admit: 2016-06-22 | Discharge: 2016-06-30 | DRG: 305 | Disposition: A | Discharge status: Home / Self Care | Payer: Medicare Other | Attending: Internal Medicine | Admitting: Internal Medicine

## 2016-06-22 ENCOUNTER — Emergency Department
Admission: EM | Admit: 2016-06-22 | Discharge: 2016-06-22 | Disposition: A | Discharge status: Home / Self Care | Payer: Medicare Other | Source: Home / Self Care | Attending: Emergency Medicine | Admitting: Emergency Medicine

## 2016-06-22 DIAGNOSIS — D6959 Other secondary thrombocytopenia: Secondary | ICD-10-CM | POA: Diagnosis present

## 2016-06-22 DIAGNOSIS — Z955 Presence of coronary angioplasty implant and graft: Secondary | ICD-10-CM

## 2016-06-22 DIAGNOSIS — J42 Unspecified chronic bronchitis: Secondary | ICD-10-CM | POA: Diagnosis present

## 2016-06-22 DIAGNOSIS — I129 Hypertensive chronic kidney disease with stage 1 through stage 4 chronic kidney disease, or unspecified chronic kidney disease: Secondary | ICD-10-CM | POA: Insufficient documentation

## 2016-06-22 DIAGNOSIS — I421 Obstructive hypertrophic cardiomyopathy: Secondary | ICD-10-CM | POA: Diagnosis not present

## 2016-06-22 DIAGNOSIS — Z86010 Personal history of colonic polyps: Secondary | ICD-10-CM

## 2016-06-22 DIAGNOSIS — Z87891 Personal history of nicotine dependence: Secondary | ICD-10-CM

## 2016-06-22 DIAGNOSIS — Z85528 Personal history of other malignant neoplasm of kidney: Secondary | ICD-10-CM

## 2016-06-22 DIAGNOSIS — Z905 Acquired absence of kidney: Secondary | ICD-10-CM

## 2016-06-22 DIAGNOSIS — R778 Other specified abnormalities of plasma proteins: Secondary | ICD-10-CM

## 2016-06-22 DIAGNOSIS — I10 Essential (primary) hypertension: Secondary | ICD-10-CM | POA: Diagnosis not present

## 2016-06-22 DIAGNOSIS — T451X5A Adverse effect of antineoplastic and immunosuppressive drugs, initial encounter: Secondary | ICD-10-CM | POA: Diagnosis present

## 2016-06-22 DIAGNOSIS — I160 Hypertensive urgency: Secondary | ICD-10-CM | POA: Diagnosis not present

## 2016-06-22 DIAGNOSIS — D899 Disorder involving the immune mechanism, unspecified: Secondary | ICD-10-CM

## 2016-06-22 DIAGNOSIS — N183 Chronic kidney disease, stage 3 (moderate): Secondary | ICD-10-CM

## 2016-06-22 DIAGNOSIS — E0500 Thyrotoxicosis with diffuse goiter without thyrotoxic crisis or storm: Secondary | ICD-10-CM | POA: Diagnosis present

## 2016-06-22 DIAGNOSIS — R7989 Other specified abnormal findings of blood chemistry: Secondary | ICD-10-CM

## 2016-06-22 DIAGNOSIS — I5032 Chronic diastolic (congestive) heart failure: Secondary | ICD-10-CM | POA: Diagnosis present

## 2016-06-22 DIAGNOSIS — I130 Hypertensive heart and chronic kidney disease with heart failure and stage 1 through stage 4 chronic kidney disease, or unspecified chronic kidney disease: Secondary | ICD-10-CM | POA: Diagnosis present

## 2016-06-22 DIAGNOSIS — Z79899 Other long term (current) drug therapy: Secondary | ICD-10-CM | POA: Insufficient documentation

## 2016-06-22 DIAGNOSIS — C50219 Malignant neoplasm of upper-inner quadrant of unspecified female breast: Secondary | ICD-10-CM | POA: Diagnosis present

## 2016-06-22 DIAGNOSIS — R197 Diarrhea, unspecified: Secondary | ICD-10-CM

## 2016-06-22 DIAGNOSIS — E782 Mixed hyperlipidemia: Secondary | ICD-10-CM

## 2016-06-22 DIAGNOSIS — N179 Acute kidney failure, unspecified: Secondary | ICD-10-CM | POA: Diagnosis not present

## 2016-06-22 DIAGNOSIS — E876 Hypokalemia: Secondary | ICD-10-CM | POA: Diagnosis present

## 2016-06-22 DIAGNOSIS — R52 Pain, unspecified: Secondary | ICD-10-CM

## 2016-06-22 DIAGNOSIS — Z170 Estrogen receptor positive status [ER+]: Secondary | ICD-10-CM

## 2016-06-22 DIAGNOSIS — I248 Other forms of acute ischemic heart disease: Secondary | ICD-10-CM | POA: Diagnosis present

## 2016-06-22 DIAGNOSIS — Z9841 Cataract extraction status, right eye: Secondary | ICD-10-CM

## 2016-06-22 DIAGNOSIS — Z9012 Acquired absence of left breast and nipple: Secondary | ICD-10-CM

## 2016-06-22 DIAGNOSIS — Z7982 Long term (current) use of aspirin: Secondary | ICD-10-CM

## 2016-06-22 DIAGNOSIS — E039 Hypothyroidism, unspecified: Secondary | ICD-10-CM | POA: Insufficient documentation

## 2016-06-22 DIAGNOSIS — Z8249 Family history of ischemic heart disease and other diseases of the circulatory system: Secondary | ICD-10-CM

## 2016-06-22 DIAGNOSIS — C50919 Malignant neoplasm of unspecified site of unspecified female breast: Secondary | ICD-10-CM | POA: Insufficient documentation

## 2016-06-22 DIAGNOSIS — Z9889 Other specified postprocedural states: Secondary | ICD-10-CM

## 2016-06-22 DIAGNOSIS — Z8711 Personal history of peptic ulcer disease: Secondary | ICD-10-CM

## 2016-06-22 DIAGNOSIS — Z8679 Personal history of other diseases of the circulatory system: Secondary | ICD-10-CM | POA: Insufficient documentation

## 2016-06-22 DIAGNOSIS — Z9842 Cataract extraction status, left eye: Secondary | ICD-10-CM

## 2016-06-22 DIAGNOSIS — E860 Dehydration: Secondary | ICD-10-CM | POA: Diagnosis present

## 2016-06-22 DIAGNOSIS — R1110 Vomiting, unspecified: Secondary | ICD-10-CM | POA: Insufficient documentation

## 2016-06-22 DIAGNOSIS — G4733 Obstructive sleep apnea (adult) (pediatric): Secondary | ICD-10-CM | POA: Diagnosis present

## 2016-06-22 DIAGNOSIS — D849 Immunodeficiency, unspecified: Secondary | ICD-10-CM

## 2016-06-22 DIAGNOSIS — Z66 Do not resuscitate: Secondary | ICD-10-CM | POA: Diagnosis present

## 2016-06-22 DIAGNOSIS — Z853 Personal history of malignant neoplasm of breast: Secondary | ICD-10-CM

## 2016-06-22 DIAGNOSIS — Z923 Personal history of irradiation: Secondary | ICD-10-CM

## 2016-06-22 DIAGNOSIS — Z800 Family history of malignant neoplasm of digestive organs: Secondary | ICD-10-CM

## 2016-06-22 DIAGNOSIS — Z888 Allergy status to other drugs, medicaments and biological substances status: Secondary | ICD-10-CM

## 2016-06-22 DIAGNOSIS — F329 Major depressive disorder, single episode, unspecified: Secondary | ICD-10-CM | POA: Diagnosis present

## 2016-06-22 LAB — CBC WITH DIFFERENTIAL/PLATELET
Basophils Absolute: 0.1 10*3/uL (ref 0–0.1)
Eosinophils Absolute: 0.3 10*3/uL (ref 0–0.7)
Eosinophils Relative: 5 %
HCT: 32.5 % — ABNORMAL LOW (ref 35.0–47.0)
HEMOGLOBIN: 11 g/dL — AB (ref 12.0–16.0)
LYMPHS ABS: 0.5 10*3/uL — AB (ref 1.0–3.6)
Lymphocytes Relative: 8 %
MCH: 30.3 pg (ref 26.0–34.0)
MCHC: 33.7 g/dL (ref 32.0–36.0)
MCV: 89.8 fL (ref 80.0–100.0)
Monocytes Absolute: 0.3 10*3/uL (ref 0.2–0.9)
NEUTROS ABS: 5.2 10*3/uL (ref 1.4–6.5)
Platelets: 119 10*3/uL — ABNORMAL LOW (ref 150–440)
RBC: 3.62 MIL/uL — ABNORMAL LOW (ref 3.80–5.20)
RDW: 15.9 % — ABNORMAL HIGH (ref 11.5–14.5)
WBC: 6.5 10*3/uL (ref 3.6–11.0)

## 2016-06-22 LAB — COMPREHENSIVE METABOLIC PANEL WITH GFR
ALT: 12 U/L — ABNORMAL LOW (ref 14–54)
AST: 15 U/L (ref 15–41)
Albumin: 3.2 g/dL — ABNORMAL LOW (ref 3.5–5.0)
Alkaline Phosphatase: 120 U/L (ref 38–126)
Anion gap: 8 (ref 5–15)
BUN: 34 mg/dL — ABNORMAL HIGH (ref 6–20)
CO2: 22 mmol/L (ref 22–32)
Calcium: 8.2 mg/dL — ABNORMAL LOW (ref 8.9–10.3)
Chloride: 112 mmol/L — ABNORMAL HIGH (ref 101–111)
Creatinine, Ser: 1.36 mg/dL — ABNORMAL HIGH (ref 0.44–1.00)
GFR calc Af Amer: 44 mL/min — ABNORMAL LOW
GFR calc non Af Amer: 38 mL/min — ABNORMAL LOW
Glucose, Bld: 119 mg/dL — ABNORMAL HIGH (ref 65–99)
Potassium: 3.2 mmol/L — ABNORMAL LOW (ref 3.5–5.1)
Sodium: 142 mmol/L (ref 135–145)
Total Bilirubin: 0.3 mg/dL (ref 0.3–1.2)
Total Protein: 5.6 g/dL — ABNORMAL LOW (ref 6.5–8.1)

## 2016-06-22 LAB — TROPONIN I
Troponin I: 0.05 ng/mL
Troponin I: <0.03 ng/mL (ref <0.03)

## 2016-06-22 LAB — LIPASE, BLOOD: Lipase: 17 U/L (ref 11–51)

## 2016-06-22 MED ORDER — ONDANSETRON HCL 4 MG/2ML IJ SOLN
4.0000 mg | Freq: Once | INTRAMUSCULAR | Status: AC
  Administered 2016-06-22: 4 mg via INTRAVENOUS

## 2016-06-22 MED ORDER — TRAZODONE HCL 50 MG PO TABS: 25.0000 mg | ORAL_TABLET | Freq: Every evening | ORAL | Status: DC | PRN

## 2016-06-22 MED ORDER — NITROGLYCERIN 0.4 MG SL SUBL: 0.4000 mg | SUBLINGUAL_TABLET | SUBLINGUAL | Status: DC | PRN

## 2016-06-22 MED ORDER — DOXAZOSIN MESYLATE 4 MG PO TABS
8.0000 mg | ORAL_TABLET | Freq: Every day | ORAL | Status: DC
  Administered 2016-06-23 – 2016-06-27 (×5): 8 mg via ORAL
  Filled 2016-06-22 (×2): qty 2
  Filled 2016-06-22: qty 1
  Filled 2016-06-22 (×5): qty 2

## 2016-06-22 MED ORDER — MIRTAZAPINE 30 MG PO TBDP
45.0000 mg | ORAL_TABLET | Freq: Every day | ORAL | Status: DC
  Filled 2016-06-22: qty 1

## 2016-06-22 MED ORDER — ONDANSETRON HCL 4 MG/2ML IJ SOLN
4.0000 mg | Freq: Four times a day (QID) | INTRAMUSCULAR | Status: DC | PRN
  Administered 2016-06-22 – 2016-06-23 (×2): 4 mg via INTRAVENOUS
  Filled 2016-06-22 (×2): qty 2

## 2016-06-22 MED ORDER — PROCHLORPERAZINE MALEATE 10 MG PO TABS
10.0000 mg | ORAL_TABLET | Freq: Four times a day (QID) | ORAL | Status: DC | PRN
  Administered 2016-06-22 – 2016-06-25 (×2): 10 mg via ORAL
  Filled 2016-06-22 (×3): qty 1

## 2016-06-22 MED ORDER — CLONIDINE HCL 0.1 MG PO TABS
0.1000 mg | ORAL_TABLET | Freq: Three times a day (TID) | ORAL | Status: DC
  Administered 2016-06-22 (×2): 0.1 mg via ORAL
  Filled 2016-06-22 (×2): qty 1

## 2016-06-22 MED ORDER — HYDRALAZINE HCL 20 MG/ML IJ SOLN
10.0000 mg | Freq: Four times a day (QID) | INTRAMUSCULAR | Status: DC | PRN
  Administered 2016-06-23 (×2): 10 mg via INTRAVENOUS
  Filled 2016-06-22 (×2): qty 1

## 2016-06-22 MED ORDER — FESOTERODINE FUMARATE ER 4 MG PO TB24
4.0000 mg | ORAL_TABLET | Freq: Every day | ORAL | Status: DC
  Administered 2016-06-23 – 2016-06-30 (×8): 4 mg via ORAL
  Filled 2016-06-22 (×9): qty 1

## 2016-06-22 MED ORDER — LISINOPRIL 10 MG PO TABS
10.0000 mg | ORAL_TABLET | Freq: Every day | ORAL | Status: DC
  Administered 2016-06-23 – 2016-06-24 (×2): 10 mg via ORAL
  Filled 2016-06-22 (×3): qty 1

## 2016-06-22 MED ORDER — LIDOCAINE 5 % EX PTCH: 1.0000 | MEDICATED_PATCH | CUTANEOUS | Status: DC

## 2016-06-22 MED ORDER — MORPHINE SULFATE (PF) 4 MG/ML IV SOLN
4.0000 mg | Freq: Once | INTRAVENOUS | Status: AC
  Administered 2016-06-22: 4 mg via INTRAVENOUS

## 2016-06-22 MED ORDER — ASPIRIN EC 81 MG PO TBEC
162.0000 mg | DELAYED_RELEASE_TABLET | Freq: Every day | ORAL | Status: DC
  Administered 2016-06-23 – 2016-06-30 (×8): 162 mg via ORAL
  Filled 2016-06-22 (×9): qty 2

## 2016-06-22 MED ORDER — ONDANSETRON HCL 4 MG PO TABS: 4.0000 mg | ORAL_TABLET | Freq: Four times a day (QID) | ORAL | Status: DC | PRN

## 2016-06-22 MED ORDER — BISACODYL 5 MG PO TBEC: 5.0000 mg | DELAYED_RELEASE_TABLET | Freq: Every day | ORAL | Status: DC | PRN

## 2016-06-22 MED ORDER — ONDANSETRON HCL 4 MG PO TABS: ORAL_TABLET | ORAL | Status: DC

## 2016-06-22 MED ORDER — CARVEDILOL 12.5 MG PO TABS: 12.5000 mg | ORAL_TABLET | Freq: Two times a day (BID) | ORAL | Status: DC

## 2016-06-22 MED ORDER — TRAZODONE HCL 100 MG PO TABS
100.0000 mg | ORAL_TABLET | Freq: Every day | ORAL | Status: DC
  Administered 2016-06-22 – 2016-06-29 (×8): 100 mg via ORAL
  Filled 2016-06-22: qty 2
  Filled 2016-06-22 (×4): qty 1
  Filled 2016-06-22 (×3): qty 2

## 2016-06-22 MED ORDER — CLONIDINE HCL 0.1 MG PO TABS
0.1000 mg | ORAL_TABLET | Freq: Once | ORAL | Status: AC
  Administered 2016-06-22: 0.1 mg via ORAL
  Filled 2016-06-22: qty 1

## 2016-06-22 MED ORDER — ALPRAZOLAM 0.25 MG PO TABS: 0.2500 mg | ORAL_TABLET | Freq: Three times a day (TID) | ORAL | Status: DC | PRN

## 2016-06-22 MED ORDER — OXYCODONE-ACETAMINOPHEN 5-325 MG PO TABS
1.0000 | ORAL_TABLET | ORAL | Status: DC | PRN
  Administered 2016-06-22: 2 via ORAL
  Filled 2016-06-22: qty 2

## 2016-06-22 MED ORDER — DEXAMETHASONE 4 MG PO TABS
4.0000 mg | ORAL_TABLET | Freq: Every day | ORAL | Status: DC
  Administered 2016-06-23 – 2016-06-30 (×8): 4 mg via ORAL
  Filled 2016-06-22 (×9): qty 1

## 2016-06-22 MED ORDER — SODIUM CHLORIDE 0.9 % IV BOLUS (SEPSIS)
500.0000 mL | INTRAVENOUS | Status: AC
  Administered 2016-06-22: 500 mL via INTRAVENOUS

## 2016-06-22 MED ORDER — OXYCODONE-ACETAMINOPHEN 5-325 MG PO TABS: 1.0000 | ORAL_TABLET | ORAL | Status: DC | PRN

## 2016-06-22 MED ORDER — OXYCODONE-ACETAMINOPHEN 5-325 MG PO TABS
2.0000 | ORAL_TABLET | Freq: Once | ORAL | Status: AC
  Administered 2016-06-22: 2 via ORAL
  Filled 2016-06-22: qty 2

## 2016-06-22 MED ORDER — CARVEDILOL 12.5 MG PO TABS
12.5000 mg | ORAL_TABLET | Freq: Two times a day (BID) | ORAL | Status: DC
  Administered 2016-06-22 – 2016-06-29 (×15): 12.5 mg via ORAL
  Filled 2016-06-22: qty 1
  Filled 2016-06-22 (×2): qty 2
  Filled 2016-06-22: qty 1
  Filled 2016-06-22 (×3): qty 2
  Filled 2016-06-22 (×2): qty 1
  Filled 2016-06-22: qty 2
  Filled 2016-06-22 (×2): qty 1
  Filled 2016-06-22 (×4): qty 2

## 2016-06-22 MED ORDER — BUPROPION HCL ER (SR) 100 MG PO TB12
100.0000 mg | ORAL_TABLET | Freq: Two times a day (BID) | ORAL | Status: DC
  Administered 2016-06-22 – 2016-06-30 (×16): 100 mg via ORAL
  Filled 2016-06-22 (×20): qty 1

## 2016-06-22 MED ORDER — PROMETHAZINE HCL 25 MG RE SUPP
25.0000 mg | Freq: Four times a day (QID) | RECTAL | Status: DC | PRN
  Filled 2016-06-22: qty 1

## 2016-06-22 MED ORDER — HYDRALAZINE HCL 50 MG PO TABS
100.0000 mg | ORAL_TABLET | Freq: Once | ORAL | Status: AC
  Administered 2016-06-22: 100 mg via ORAL
  Filled 2016-06-22: qty 2

## 2016-06-22 MED ORDER — DOCUSATE SODIUM 100 MG PO CAPS: ORAL_CAPSULE | ORAL | Status: DC

## 2016-06-22 MED ORDER — ATORVASTATIN CALCIUM 20 MG PO TABS
40.0000 mg | ORAL_TABLET | Freq: Every day | ORAL | Status: DC
  Administered 2016-06-23 – 2016-06-30 (×8): 40 mg via ORAL
  Filled 2016-06-22 (×9): qty 2

## 2016-06-22 MED ORDER — PENTAFLUOROPROP-TETRAFLUOROETH EX AERO
INHALATION_SPRAY | CUTANEOUS | Status: AC
  Filled 2016-06-22: qty 30

## 2016-06-22 MED ORDER — MORPHINE SULFATE (PF) 4 MG/ML IV SOLN
INTRAVENOUS | Status: AC
  Administered 2016-06-22: 4 mg via INTRAVENOUS
  Filled 2016-06-22: qty 1

## 2016-06-22 MED ORDER — LEVOTHYROXINE SODIUM 75 MCG PO TABS
175.0000 ug | ORAL_TABLET | Freq: Every day | ORAL | Status: DC
  Administered 2016-06-23 – 2016-06-30 (×8): 175 ug via ORAL
  Filled 2016-06-22: qty 1
  Filled 2016-06-22: qty 2
  Filled 2016-06-22 (×2): qty 1
  Filled 2016-06-22 (×3): qty 2
  Filled 2016-06-22: qty 1

## 2016-06-22 MED ORDER — PENTAFLUOROPROP-TETRAFLUOROETH EX AERO
INHALATION_SPRAY | CUTANEOUS | Status: AC
  Administered 2016-06-22: 09:00:00
  Filled 2016-06-22: qty 30

## 2016-06-22 MED ORDER — ACETAMINOPHEN 650 MG RE SUPP: 650.0000 mg | Freq: Four times a day (QID) | RECTAL | Status: DC | PRN

## 2016-06-22 MED ORDER — SODIUM CHLORIDE 0.9 % IV SOLN
Freq: Once | INTRAVENOUS | Status: AC
  Administered 2016-06-22: 09:00:00 via INTRAVENOUS

## 2016-06-22 MED ORDER — POTASSIUM CHLORIDE CRYS ER 20 MEQ PO TBCR
40.0000 meq | EXTENDED_RELEASE_TABLET | Freq: Once | ORAL | Status: DC
  Filled 2016-06-22: qty 2

## 2016-06-22 MED ORDER — MIRTAZAPINE 15 MG PO TBDP
45.0000 mg | ORAL_TABLET | Freq: Every day | ORAL | Status: DC
  Administered 2016-06-22 – 2016-06-29 (×8): 45 mg via ORAL
  Filled 2016-06-22 (×11): qty 3

## 2016-06-22 MED ORDER — LIDOCAINE-PRILOCAINE 2.5-2.5 % EX CREA
TOPICAL_CREAM | Freq: Two times a day (BID) | CUTANEOUS | Status: DC
  Administered 2016-06-23: 13:00:00 via TOPICAL
  Filled 2016-06-22: qty 5

## 2016-06-22 MED ORDER — HEPARIN SODIUM (PORCINE) 5000 UNIT/ML IJ SOLN
5000.0000 [IU] | Freq: Three times a day (TID) | INTRAMUSCULAR | Status: DC
  Administered 2016-06-22 – 2016-06-24 (×5): 5000 [IU] via SUBCUTANEOUS
  Filled 2016-06-22 (×5): qty 1

## 2016-06-22 MED ORDER — DOCUSATE SODIUM 100 MG PO CAPS
100.0000 mg | ORAL_CAPSULE | Freq: Two times a day (BID) | ORAL | Status: DC
  Administered 2016-06-22 – 2016-06-29 (×15): 100 mg via ORAL
  Filled 2016-06-22 (×17): qty 1

## 2016-06-22 MED ORDER — ONDANSETRON HCL 4 MG/2ML IJ SOLN
INTRAMUSCULAR | Status: AC
  Administered 2016-06-22: 4 mg via INTRAVENOUS
  Filled 2016-06-22: qty 2

## 2016-06-22 MED ORDER — HEPARIN SOD (PORK) LOCK FLUSH 100 UNIT/ML IV SOLN
INTRAVENOUS | Status: AC
  Administered 2016-06-22: 500 [IU] via INTRAVENOUS
  Filled 2016-06-22: qty 5

## 2016-06-22 MED ORDER — ROPINIROLE HCL 1 MG PO TABS
1.0000 mg | ORAL_TABLET | Freq: Three times a day (TID) | ORAL | Status: DC
  Administered 2016-06-22 – 2016-06-30 (×16): 1 mg via ORAL
  Filled 2016-06-22 (×24): qty 1

## 2016-06-22 MED ORDER — ADULT MULTIVITAMIN W/MINERALS CH
1.0000 | ORAL_TABLET | Freq: Every day | ORAL | Status: DC
  Administered 2016-06-23 – 2016-06-30 (×8): 1 via ORAL
  Filled 2016-06-22 (×9): qty 1

## 2016-06-22 MED ORDER — ACETAMINOPHEN 325 MG PO TABS
650.0000 mg | ORAL_TABLET | Freq: Four times a day (QID) | ORAL | Status: DC | PRN
Start: 2016-06-22 — End: 2016-06-30
  Administered 2016-06-26: 650 mg via ORAL
  Filled 2016-06-22: qty 2

## 2016-06-22 MED ORDER — LIDOCAINE 5 % EX PTCH
1.0000 | MEDICATED_PATCH | CUTANEOUS | Status: DC
  Filled 2016-06-22 (×2): qty 1

## 2016-06-22 MED ORDER — PANTOPRAZOLE SODIUM 40 MG PO TBEC
40.0000 mg | DELAYED_RELEASE_TABLET | Freq: Every day | ORAL | Status: DC
  Administered 2016-06-23 – 2016-06-30 (×8): 40 mg via ORAL
  Filled 2016-06-22 (×9): qty 1

## 2016-06-22 MED ORDER — LORAZEPAM 2 MG/ML IJ SOLN
1.0000 mg | INTRAMUSCULAR | Status: DC
  Administered 2016-06-22: 13:00:00 1 mg via INTRAVENOUS
  Filled 2016-06-22: qty 1

## 2016-06-22 MED ORDER — SODIUM CHLORIDE 0.9 % IV SOLN
INTRAVENOUS | Status: DC
  Administered 2016-06-22 – 2016-06-24 (×5): via INTRAVENOUS

## 2016-06-22 MED ORDER — LORAZEPAM 2 MG/ML IJ SOLN: 1.0000 mg | INTRAMUSCULAR | Status: DC | PRN

## 2016-06-22 MED ORDER — LORAZEPAM 0.5 MG PO TABS: 0.5000 mg | ORAL_TABLET | Freq: Four times a day (QID) | ORAL | Status: DC | PRN

## 2016-06-22 MED ORDER — HEPARIN SOD (PORK) LOCK FLUSH 100 UNIT/ML IV SOLN
500.0000 [IU] | Freq: Once | INTRAVENOUS | Status: AC
  Administered 2016-06-22: 500 [IU] via INTRAVENOUS

## 2016-06-22 MED ORDER — HYDROMORPHONE HCL 1 MG/ML IJ SOLN
1.0000 mg | INTRAMUSCULAR | Status: DC | PRN
  Administered 2016-06-22: 1 mg via INTRAVENOUS
  Filled 2016-06-22: qty 1

## 2016-06-22 MED ORDER — HYDRALAZINE HCL 50 MG PO TABS
100.0000 mg | ORAL_TABLET | Freq: Three times a day (TID) | ORAL | Status: DC
Start: 2016-06-22 — End: 2016-06-30
  Administered 2016-06-22 – 2016-06-30 (×24): 100 mg via ORAL
  Filled 2016-06-22 (×24): qty 2

## 2016-06-22 NOTE — ED Notes (Signed)
Pt hx of sleep apnea, placed on 2L [place]

## 2016-06-22 NOTE — H&P (Signed)
[facility] NAME: Cassidy Ortiz    MR#:  [id number]  DATE OF BIRTH:  May 15, 1941  DATE OF ADMISSION:  06/22/2016  PRIMARY CARE PHYSICIAN: Rica Mast, MD   REQUESTING/REFERRING PHYSICIAN: Rudene Re, MD  CHIEF COMPLAINT:   Chief Complaint  Patient presents with  . Back Pain  . Arm Pain   HISTORY OF PRESENT ILLNESS:  Cassidy Ortiz  is a 75 y.o. female with a known history of breast cancer currently undergoing chemotherapy in [place] seen earlier this morning for diffuse body pain associated with her chemo who presents for ongoing uncontrolled pain. Last chemo was one week/10 days ago. She has had a similar episode after her first dose of chemotherapy requiring admission to the hospital for pain control. She endorses severe, 10/10, sharp pain, located on her bilateral arms, upper back, lower back, and bilateral hips. The pain is exacerbated by touching and movement.  She is also very nauseous and threw up once or twice while here.  She seems very lethargic and falls back to sleep very easily while I am taking history.  Most of the history is per medical records review and talking to her husband who is at bedside.  Her blood pressure is also very elevated. PAST MEDICAL HISTORY:   Past Medical History  Diagnosis Date  . Vitamin D deficiency   . Mixed hyperlipidemia   . Anxiety state, unspecified   . Depressive disorder, not elsewhere classified   . Obstructive sleep apnea (adult) (pediatric)   . Hypertensive kidney disease, benign   . Cerebral aneurysm, nonruptured 2008    s/p coil and shunt, performed in [place]  . Duodenal ulcer   . Chronic kidney disease, stage III (moderate)   . Acquired cyst of kidney   . Abnormal weight gain   . Fatty liver   . Diverticulosis of colon (without mention of hemorrhage)   . Personal history of colonic polyps 10/22/2002    hyperplastic   . Hypertension   . Duodenal mass   .  Baker's cyst of knee     Left, pt does not remember  . Edema   . Unspecified hypothyroidism   . Grave's disease   . Hypothyroid   . Sleep apnea     off cpap  . Cerebral aneurysm rupture (HCC)     stent  . Radiation 2001    BREAST CA  . Malignant neoplasm of breast (female), unspecified site 2001    right, s/p lumpectomy and XRT  . 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 Breast cancer ([place]) 2001    RT LUMPECTOMY  . Breast cancer of upper-inner quadrant of left female breast ([place]) 04/19/2016    PAST SURGICAL HISTORY:   Past Surgical History  Procedure Laterality Date  . Breast lumpectomy Right     right  . Partial nephrectomy      right  . Shoulder surgery      left  . Cataract extraction      bilateral  . Coronary stent placement      brain  . Femoral artery repair  2011  . Csf shunt  08/17/2008  . Hemiarthroplasty shoulder fracture      left  . Vaginal delivery      3  . Upper gastrointestinal endoscopy  11-10-2014  . Colonoscopy      last 2012.+ TA polyps  . Breast biopsy Left 03/14/2016  stereo  . Radioactive seed guided mastectomy with axillary sentinel lymph node biopsy Left 04/19/2016    Procedure: RADIOACTIVE SEED GUIDED PARTIAL MASTECTOMY WITH AXILLARY SENTINEL LYMPH NODE BIOPSY;  Surgeon: Fanny Skates, MD;  Location: [facility];  Service: General;  Laterality: Left;    SOCIAL HISTORY:   Social History  Substance Use Topics  . Smoking status: Former Smoker -- 1.00 packs/day for 43 years    Types: Cigarettes    Start date: 04/01/1964    Quit date: 11/24/2007  . Smokeless tobacco: Never Used     Comment: quit smoking 7 years ago  . Alcohol Use: 0.6 oz/week    1 Glasses of wine per week     Comment: occasssionally    FAMILY HISTORY:   Family History  Problem Relation Age of Onset  . Colon cancer Sister   . Cancer Sister     colon  . Heart disease Father   . Esophageal cancer Neg Hx   .  Rectal cancer Neg Hx   . Stomach cancer Neg Hx   . Heart disease Mother     DRUG ALLERGIES:   Allergies  Allergen Reactions  . Amlodipine     Leg swelling    REVIEW OF SYSTEMS:   Review of Systems  Unable to perform ROS: mental acuity  Lethargic MEDICATIONS AT HOME:   Prior to Admission medications   Medication Sig Start Date End Date Taking? Authorizing Provider  ALPRAZolam (XANAX) 0.25 MG tablet Take 1 tablet (0.25 mg total) by mouth 3 (three) times daily as needed for anxiety. 03/16/15  Yes Jackolyn Confer, MD  aspirin 81 MG tablet Take 162 mg by mouth daily at 12 noon. Take at noon.   Yes Historical Provider, MD  atorvastatin (LIPITOR) 40 MG tablet TAKE 1 TABLET DAILY 05/07/16  Yes Jackolyn Confer, MD  buPROPion [facility] SR) 100 MG 12 hr tablet Take 100 mg by mouth 2 (two) times daily.   Yes Historical Provider, MD  carvedilol (COREG) 12.5 MG tablet TAKE 1 TABLET TWICE A DAY  WITH MEALS. 03/25/16  Yes Minna Merritts, MD  cloNIDine (CATAPRES) 0.1 MG tablet Take 1 tablet (0.1 mg total) by mouth 3 (three) times daily. 04/05/16  Yes Minna Merritts, MD  dexamethasone (DECADRON) 4 MG tablet Take 2 tablets by mouth once a day on the day after chemotherapy and then take 2 tablets two times a day for 2 days. Take with food. 05/22/16  Yes Volanda Napoleon, MD  docusate sodium (COLACE) 100 MG capsule Take 1 tablet once or twice daily as needed for constipation while taking narcotic pain medicine 06/22/16  Yes Hinda Kehr, MD  doxazosin (CARDURA) 8 MG tablet TAKE 0.5 TABLET DAILY 06/02/16  Yes Loletha Grayer, MD  esomeprazole (NEXIUM) 20 MG capsule Take 20 mg by mouth every morning.   Yes Historical Provider, MD  fesoterodine (TOVIAZ) 4 MG TB24 tablet Take 4 mg by mouth every morning.    Yes Historical Provider, MD  hydrALAZINE (APRESOLINE) 100 MG tablet TAKE 1 TABLET 3 TIMES A DAY 08/15/15  Yes Minna Merritts, MD  levothyroxine (SYNTHROID) 175 MCG tablet Take 1 tablet (175 mcg total)  by mouth daily. 10/05/15  Yes Jackolyn Confer, MD  lidocaine (LIDODERM) 5 % Place 1 patch onto the skin daily. Leave for 12hr 06/22/16  Yes Hinda Kehr, MD  lidocaine-prilocaine (EMLA) cream Apply to affected area as needed 05/22/16  Yes Volanda Napoleon, MD  lisinopril (PRINIVIL,ZESTRIL)  10 MG tablet Take 10 mg by mouth daily.   Yes Historical Provider, MD  LORazepam (ATIVAN) 0.5 MG tablet Take 1 tablet (0.5 mg total) by mouth every 6 (six) hours as needed (Nausea or vomiting). 05/22/16  Yes Volanda Napoleon, MD  mirtazapine (REMERON SOL-TAB) 45 MG disintegrating tablet Take 45 mg by mouth at bedtime.   Yes Historical Provider, MD  Multiple Vitamin (MULTIVITAMIN) tablet Take 1 tablet by mouth daily.     Yes Historical Provider, MD  nitroGLYCERIN (NITROSTAT) 0.4 MG SL tablet Place 1 tablet (0.4 mg total) under the tongue every 5 (five) minutes as needed for chest pain. 04/05/15  Yes Minna Merritts, MD  ondansetron (ZOFRAN) 4 MG tablet Take 1-2 tabs by mouth every 8 hours as needed for nausea/vomiting 06/22/16  Yes Hinda Kehr, MD  oxyCODONE-acetaminophen (ROXICET) 5-325 MG tablet Take 1-2 tablets by mouth every 4 (four) hours as needed for severe pain. 06/22/16  Yes Hinda Kehr, MD  prochlorperazine (COMPAZINE) 10 MG tablet Take 1 tablet (10 mg total) by mouth every 6 (six) hours as needed (Nausea or vomiting). 05/22/16  Yes Volanda Napoleon, MD  rOPINIRole (REQUIP) 1 MG tablet TAKE 1 TABLET (1 MG TOTAL) BY MOUTH DAILY AS NEEDED. 04/29/16  Yes Jackolyn Confer, MD  traZODone (DESYREL) 100 MG tablet Take 100 mg by mouth every evening.   Yes Historical Provider, MD      VITAL SIGNS:  Blood pressure 196/75, pulse 65, temperature 98.7 F (37.1 C), temperature source Oral, resp. rate 20, height 5\' 7"  (1.702 m), weight 88.451 kg (195 lb), SpO2 97 %. PHYSICAL EXAMINATION:  Physical Exam  Constitutional: She is well-developed, well-nourished, and in no distress. She appears lethargic.  HENT:  Head:  Normocephalic and atraumatic.  Eyes: Conjunctivae and EOM are normal. Pupils are equal, round, and reactive to light.  Neck: Normal range of motion. Neck supple. No tracheal deviation present. No thyromegaly present.  Cardiovascular: Normal rate, regular rhythm and normal heart sounds.   Pulmonary/Chest: Effort normal and breath sounds normal. No respiratory distress. She has no wheezes. She exhibits no tenderness.  Abdominal: Soft. Bowel sounds are normal. She exhibits no distension. There is no tenderness.  Musculoskeletal: Normal range of motion.  Neurological: She appears lethargic. She is disoriented. She displays weakness. No cranial nerve deficit.  she is very lethargic and falling back to sleep while talking  Skin: Skin is warm and dry. No rash noted.  Psychiatric: Mood and affect normal.  Difficult to evaluate as she is very lethargic and falling back to sleep while talking  Nursing note and vitals reviewed.  LABORATORY PANEL:   CBC  Recent Labs Lab 06/22/16 0254  WBC 6.5  HGB 11.0*  HCT 32.5*  PLT 119*   ------------------------------------------------------------------------------------------------------------------  Chemistries   Recent Labs Lab 06/22/16 0254  NA 142  K 3.2*  CL 112*  CO2 22  GLUCOSE 119*  BUN 34*  CREATININE 1.36*  CALCIUM 8.2*  AST 15  ALT 12*  ALKPHOS 120  BILITOT 0.3   IMPRESSION AND PLAN:  75 y.o. female with a known history of breast cancer currently undergoing chemotherapy at [facility] Being admitted for  * Uncontrolled generalized pain: - Likely from recent chemotherapy - Tylenol for mild pain, Percocet for moderate and Dilaudid for severe pain.  - Can consider Lidoderm patch on discharge, or while here, If need  * Uncontrolled hypertension: Likely secondary to severe pain on presentation.  - Continue clonidine, Coreg, Cardura, hydralazine  and lisinopril - We will order hydralazine 10 mg IV every 6 hour as needed for better  blood pressure control  * Hypokalemia - Replete and recheck  * Acute on Chronic kidney disease stage III - Likely due to dehydration, prerenal etiology - She did have some diarrhea, nausea and vomiting 2-3 days before, likely from her chemotherapy - Aggressive IV hydration and monitor  * Elevated troponin: due to supply demand ischemia secondary to the accelerated hypertension. No further cardiac workup done in the hospital.  * History of breast cancer follow-up with oncology as outpatient [name]' disease. Continue levothyroxine * History of sleep apnea * Depression. Continued psychiatric medications * Thrombocytopenia likely from recent chemotherapy     All the records are reviewed and case discussed with ED provider. Management plans discussed with the patient, family (husband at bedside) and nursing. they are in agreement.  CODE STATUS: DO NOT RESUSCITATE  TOTAL TIME TAKING CARE OF THIS PATIENT: 45 minutes.    [facility], Ifeoluwa Beller M.D on 06/22/2016 at 1:25 PM  Between 7am to 6pm - Pager - [phone]  After 6pm go to [email or web address] - Proofreader  [employer]  Hospitalists  Office  [phone]  CC: Primary care physician; Rica Mast, MD   Note: This dictation was prepared with Dragon dictation along with smaller phrase technology. Any transcriptional errors that result from this process are unintentional.

## 2016-06-22 NOTE — ED Notes (Signed)
Pt. Going home with husband. 

## 2016-06-22 NOTE — ED Provider Notes (Signed)
[facility] Emergency Department Provider Note  ____________________________________________  Time seen: Approximately 2:20 AM  I have reviewed the triage vital signs and the nursing notes.   HISTORY  Chief Complaint Emesis; Diarrhea; and Generalized Body Aches    HPI Cassidy Ortiz is a 75 y.o. female with a history of breast cancer currently undergoing chemotherapy in [place].  She presents tonight for evaluation of pain all over her body as well as persistent vomiting and diarrhea that has been going on about 2 days.  She reports that she has had now 2 doses of chemotherapy, separated by 3 weeks.  Her last dose was about a week ago.  After her first dose of chemotherapy she had exactly the same reaction, developing chest pain, low back pain, and vomiting with diarrhea approximately one week after the dose.  She was admitted to this hospital and kept for several days.A Lidoderm patch completely resolved for back pain.  She has a patch that was prescribed upon her last discharge, but she states she does not know where to put it because so many different places hurt.  She denies fever/chills and abdominal pain.  She reports severe sharp and aching pain in her left arm and shoulder, her right shoulder, and some in her chest although that is minor.  She describes it as severe and nothing makes it better or worse.    Past Medical History  Diagnosis Date  . Vitamin D deficiency   . Mixed hyperlipidemia   . Anxiety state, unspecified   . Depressive disorder, not elsewhere classified   . Obstructive sleep apnea (adult) (pediatric)   . Hypertensive kidney disease, benign   . Cerebral aneurysm, nonruptured 2008    s/p coil and shunt, performed in [place]  . Duodenal ulcer   . Chronic kidney disease, stage III (moderate)   . Acquired cyst of kidney   . Abnormal weight gain   . Fatty liver   . Diverticulosis of colon (without mention of hemorrhage)   . Personal  history of colonic polyps 10/22/2002    hyperplastic   . Hypertension   . Duodenal mass   . Baker's cyst of knee     Left, pt does not remember  . Edema   . Unspecified hypothyroidism   . Grave's disease   . Hypothyroid   . Sleep apnea     off cpap  . Cerebral aneurysm rupture (HCC)     stent  . Radiation 2001    BREAST CA  . Malignant neoplasm of breast (female), unspecified site 2001    right, s/p lumpectomy and XRT  . 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 Breast cancer ([place]) 2001    RT LUMPECTOMY  . Breast cancer of upper-inner quadrant of left female breast ([place]) 04/19/2016    Patient Active Problem List   Diagnosis Date Noted  . Low back pain 06/06/2016  . Elevated troponin 06/01/2016  . Breast cancer of upper-inner quadrant of left female breast ([place]) 04/19/2016  . 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 Acute cystitis without hematuria 03/12/2016  . Osteopenia 02/20/2016  . Abnormal mammogram of left breast 02/20/2016  . Radiation   . Rosacea 12/29/2015  . Mixed stress and urge urinary incontinence 11/28/2015  . OSA (obstructive sleep apnea) 06/29/2015  . Major depressive disorder, recurrent, severe without psychotic features ([place]) 03/16/2015  . Obesity (BMI 30-39.9) 08/18/2014  . Hyperlipidemia 01/05/2014  .  Insomnia 10/15/2013  . Anemia 03/24/2013  . Medicare annual wellness visit, subsequent 12/21/2012  . Cerebral aneurysm, nonruptured 11/12/2012  . Obstructive hydrocephalus 11/12/2012  . Hemorrhage into subarachnoid space of neuraxis ([place]) 11/12/2012  . Cerebral aneurysm 10/30/2012  . S/P VP shunt 10/30/2012  . Hypothyroidism 10/23/2012  . Acute diastolic CHF (congestive heart failure) ([place]) 05/18/2012  . 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 Fatty liver 05/28/2011  . Benign neoplasm of colon 05/13/2011  . Mass of colon 05/13/2011  . Duodenal mass 05/13/2011  . GERD (gastroesophageal  reflux disease) 05/13/2011  . Family history of malignant neoplasm of gastrointestinal tract 05/02/2011  . Hypertension 05/02/2011  . History of kidney cancer 05/02/2011  . Breast cancer ([place]) 05/02/2011  . MURMUR 09/13/2009  . GRAVES' DISEASE 09/12/2009  . MIGRAINE HEADACHE 09/12/2009    Past Surgical History  Procedure Laterality Date  . Breast lumpectomy Right     right  . Partial nephrectomy      right  . Shoulder surgery      left  . Cataract extraction      bilateral  . Coronary stent placement      brain  . Femoral artery repair  2011  . Csf shunt  08/17/2008  . Hemiarthroplasty shoulder fracture      left  . Vaginal delivery      3  . Upper gastrointestinal endoscopy  11-10-2014  . Colonoscopy      last 2012.+ TA polyps  . Breast biopsy Left 03/14/2016    stereo  . Radioactive seed guided mastectomy with axillary sentinel lymph node biopsy Left 04/19/2016    Procedure: RADIOACTIVE SEED GUIDED PARTIAL MASTECTOMY WITH AXILLARY SENTINEL LYMPH NODE BIOPSY;  Surgeon: Fanny Skates, MD;  Location: [facility];  Service: General;  Laterality: Left;    Current Outpatient Rx  Name  Route  Sig  Dispense  Refill  . ALPRAZolam (XANAX) 0.25 MG tablet   Oral   Take 1 tablet (0.25 mg total) by mouth 3 (three) times daily as needed for anxiety.   60 tablet   4   . aspirin 81 MG tablet   Oral   Take 81 mg by mouth daily. Takes 2 tablets daily         . atorvastatin (LIPITOR) 40 MG tablet      TAKE 1 TABLET DAILY   90 tablet   3   . buPROPion (WELLBUTRIN SR) 100 MG 12 hr tablet   Oral   Take 100 mg by mouth 2 (two) times daily.         . carvedilol (COREG) 12.5 MG tablet      TAKE 1 TABLET TWICE A DAY  WITH MEALS.   180 tablet   3   . cloNIDine (CATAPRES) 0.1 MG tablet   Oral   Take 1 tablet (0.1 mg total) by mouth 3 (three) times daily.   270 tablet   3   . dexamethasone (DECADRON) 4 MG tablet      Take 2 tablets by mouth once a day on the  day after chemotherapy and then take 2 tablets two times a day for 2 days. Take with food.   30 tablet   1   . doxazosin (CARDURA) 8 MG tablet      TAKE 0.5 TABLET DAILY   90 tablet   3   . Esomeprazole Magnesium (NEXIUM PO)   Oral   Take 20 mg by mouth every morning.          Marland Kitchen  fesoterodine (TOVIAZ) 4 MG TB24 tablet   Oral   Take 4 mg by mouth daily.         . hydrALAZINE (APRESOLINE) 100 MG tablet      TAKE 1 TABLET 3 TIMES A DAY   270 tablet   3   . levothyroxine (SYNTHROID) 175 MCG tablet   Oral   Take 1 tablet (175 mcg total) by mouth daily.   90 tablet   3   . lidocaine-prilocaine (EMLA) cream      Apply to affected area as needed   30 g   3   . lisinopril (PRINIVIL,ZESTRIL) 10 MG tablet   Oral   Take 10 mg by mouth daily.         Marland Kitchen LORazepam (ATIVAN) 0.5 MG tablet   Oral   Take 1 tablet (0.5 mg total) by mouth every 6 (six) hours as needed (Nausea or vomiting).   30 tablet   0   . mirtazapine (REMERON SOL-TAB) 45 MG disintegrating tablet   Oral   Take 45 mg by mouth at bedtime.         . Multiple Vitamin (MULTIVITAMIN) tablet   Oral   Take 1 tablet by mouth daily.           . nitroGLYCERIN (NITROSTAT) 0.4 MG SL tablet   Sublingual   Place 1 tablet (0.4 mg total) under the tongue every 5 (five) minutes as needed for chest pain.   25 tablet   3   . prochlorperazine (COMPAZINE) 10 MG tablet   Oral   Take 1 tablet (10 mg total) by mouth every 6 (six) hours as needed (Nausea or vomiting).   30 tablet   1   . rOPINIRole (REQUIP) 1 MG tablet      TAKE 1 TABLET (1 MG TOTAL) BY MOUTH DAILY AS NEEDED.   90 tablet   1   . traZODone (DESYREL) 50 MG tablet   Oral   Take 2 tablets (100 mg total) by mouth at bedtime.   180 tablet   1   . docusate sodium (COLACE) 100 MG capsule      Take 1 tablet once or twice daily as needed for constipation while taking narcotic pain medicine   30 capsule   0   . lidocaine (LIDODERM) 5 %    Transdermal   Place 1 patch onto the skin daily. Leave for 12hr   15 patch   6   . ondansetron (ZOFRAN) 4 MG tablet      Take 1-2 tabs by mouth every 8 hours as needed for nausea/vomiting   30 tablet   0   . oxyCODONE-acetaminophen (ROXICET) 5-325 MG tablet   Oral   Take 1-2 tablets by mouth every 4 (four) hours as needed for severe pain.   30 tablet   0     Allergies Amlodipine  Family History  Problem Relation Age of Onset  . Colon cancer Sister   . Cancer Sister     colon  . Heart disease Father   . Esophageal cancer Neg Hx   . Rectal cancer Neg Hx   . Stomach cancer Neg Hx   . Heart disease Mother     Social History Social History  Substance Use Topics  . Smoking status: Former Smoker -- 1.00 packs/day for 43 years    Types: Cigarettes    Start date: 04/01/1964    Quit date: 11/24/2007  . Smokeless tobacco: Never Used  Comment: quit smoking 7 years ago  . Alcohol Use: 0.6 oz/week    1 Glasses of wine per week     Comment: occasssionally    Review of Systems Constitutional: No fever/chills Eyes: No visual changes. ENT: No sore throat. Cardiovascular: Occasional chest pain. Respiratory: Denies shortness of breath. Gastrointestinal: No abdominal pain.  +N/V/D Genitourinary: Negative for dysuria. Musculoskeletal: Pain in the left and right shoulder as well as the left arm and somewhat in her chest wall Skin: Negative for rash. Neurological: Negative for headaches, focal weakness or numbness.  10-point ROS otherwise negative.  ____________________________________________   PHYSICAL EXAM:  VITAL SIGNS: ED Triage Vitals  Enc Vitals Group     BP 06/22/16 0119 195/84 mmHg     Pulse Rate 06/22/16 0119 72     Resp 06/22/16 0119 18     Temp 06/22/16 0119 98.3 F (36.8 C)     Temp Source 06/22/16 0119 Oral     SpO2 06/22/16 0119 96 %     Weight 06/22/16 0119 195 lb (88.451 kg)     Height 06/22/16 0119 5\' 7"  (1.702 m)     Head Cir --      Peak  Flow --      Pain Score 06/22/16 0121 10     Pain Loc --      Pain Edu? --      Excl. in [place]? --     Constitutional: Alert and oriented. Has the appearance of chronic illness but does not appear toxic or acutely ill at this time.  No acute distress. Eyes: Conjunctivae are normal. PERRL. EOMI. Head: Atraumatic. Nose: No congestion/rhinnorhea. Mouth/Throat: Mucous membranes are moist.  Oropharynx non-erythematous. Neck: No stridor.  No meningeal signs.   Cardiovascular: Normal rate, regular rhythm. Good peripheral circulation. Grossly normal heart sounds.  Port on the right side of chest. Respiratory: Normal respiratory effort.  No retractions. Lungs CTAB. Gastrointestinal: Soft and nontender. No distention.  Musculoskeletal: No lower extremity tenderness nor edema. No gross deformities of extremities. Neurologic:  Normal speech and language. No gross focal neurologic deficits are appreciated.  Skin:  Skin is warm, dry and intact. No rash noted. Psychiatric: Mood and affect are normal. Speech and behavior are normal.  ____________________________________________   LABS (all labs ordered are listed, but only abnormal results are displayed)  Labs Reviewed  CBC WITH DIFFERENTIAL/PLATELET - Abnormal; Notable for the following:    RBC 3.62 (*)    Hemoglobin 11.0 (*)    HCT 32.5 (*)    RDW 15.9 (*)    Platelets 119 (*)    Lymphs Abs 0.5 (*)    All other components within normal limits  COMPREHENSIVE METABOLIC PANEL - Abnormal; Notable for the following:    Potassium 3.2 (*)    Chloride 112 (*)    Glucose, Bld 119 (*)    BUN 34 (*)    Creatinine, Ser 1.36 (*)    Calcium 8.2 (*)    Total Protein 5.6 (*)    Albumin 3.2 (*)    ALT 12 (*)    GFR calc non Af Amer 38 (*)    GFR calc Af Amer 44 (*)    All other components within normal limits  TROPONIN I - Abnormal; Notable for the following:    Troponin I 0.05 (*)    All other components within normal limits  LIPASE, BLOOD    ____________________________________________  EKG  ED ECG REPORT I, Alichia Alridge, the attending physician, personally viewed  and interpreted this ECG.  Date: 06/22/2016 EKG Time: 05:57 Rate: 66 Rhythm: normal sinus rhythm QRS Axis: normal Intervals: normal ST/T Wave abnormalities: normal Conduction Disturbances: none Narrative Interpretation: unremarkable  ____________________________________________  RADIOLOGY   No results found.  ____________________________________________   PROCEDURES  Procedure(s) performed:   Procedures   ____________________________________________   INITIAL IMPRESSION / ASSESSMENT AND PLAN / ED COURSE  Pertinent labs & imaging results that were available during my care of the patient were reviewed by me and considered in my medical decision making (see chart for details).  ----------------------------------------- 5:25 AM on 06/22/2016 -----------------------------------------  Upon my initial evaluation I ordered basic labs provided a dose morphine and Zofran.  This helped the patient a lot but then her pain came back.  I gave her another dose of morphine which again helped but then her pain is back again.  I had a long conversation with her and her husband and we discussed inpatient treatment of her pain versus attempt at outpatient management.  Currently she is willing to try to Percocet and see if this will be adequate.  She has not had any additional episodes of vomiting nor diarrhea in the emergency department.  I explained to her that particular since she is immunosuppressed on chemotherapy she would likely be better off at home if her pain can be adequately controlled.  He agreed that we would allow the Percocet to work and if it is not sufficient and she may need to be admitted for intractable pain.  ----------------------------------------- 6:27 AM on 06/22/2016 -----------------------------------------  The patient is much  more comfortable at this time after the Percocet kicked in.  Although she has an elevated troponin very slightly at 0.05, this is actually lower than it has been in the past.  An extended conversation with the patient and her husband and offered admission for pain control, but she prefers to go home at this time.  Given that her blood pressure is reasonable given that she has been here all night and is ready for her morning medications, her pain is well controlled, and this is identical to the presentation she had after her last chemotherapy treatment, and admission to the hospital would result in significant exposure to other pathogens in this immunocompromised patient, I think it is appropriate to try outpatient treatment.  I gave her another prescription for Lidoderm patches, Percocet, Zofran, and Colace.   I gave my usual and customary return precautions.   She and her husband understand and agree.  ____________________________________________  FINAL CLINICAL IMPRESSION(S) / ED DIAGNOSES  Final diagnoses:  Generalized pain  Elevated troponin I level  Immunosuppressed status ([place])     MEDICATIONS GIVEN DURING THIS VISIT:  Medications  pentafluoroprop-tetrafluoroeth (GEBAUERS) aerosol (not administered)  heparin lock flush 100 unit/mL (not administered)  morphine 4 MG/ML injection 4 mg (4 mg Intravenous Given 06/22/16 0200)  ondansetron (ZOFRAN) injection 4 mg (4 mg Intravenous Given 06/22/16 0200)  sodium chloride 0.9 % bolus 500 mL (0 mLs Intravenous Stopped 06/22/16 0433)  morphine 4 MG/ML injection 4 mg (4 mg Intravenous Given 06/22/16 0315)  oxyCODONE-acetaminophen (PERCOCET/ROXICET) 5-325 MG per tablet 2 tablet (2 tablets Oral Given 06/22/16 0509)     NEW OUTPATIENT MEDICATIONS STARTED DURING THIS VISIT:  New Prescriptions   DOCUSATE SODIUM (COLACE) 100 MG CAPSULE    Take 1 tablet once or twice daily as needed for constipation while taking narcotic pain medicine   ONDANSETRON  (ZOFRAN) 4 MG TABLET    Take 1-2 tabs  by mouth every 8 hours as needed for nausea/vomiting   OXYCODONE-ACETAMINOPHEN (ROXICET) 5-325 MG TABLET    Take 1-2 tablets by mouth every 4 (four) hours as needed for severe pain.   Refilled Lidoderm patch prescription (qty 15)    Note:  This document was prepared using Dragon voice recognition software and may include unintentional dictation errors.   Hinda Kehr, MD 06/22/16 0630

## 2016-06-22 NOTE — Discharge Instructions (Signed)
As we discussed, your workup today was reassuring.  Your troponin (cardiac enzyme) is slightly elevated, but that is consistent with your prior workups and hospitalization.  We discussed the possibility of you staying in the hospital for pain control, but since your pain is well-controlled at this time and you have not had any vomiting or diarrhea while in the emergency department, we understand your preference to go home and feel that that is appropriate.  If your pain is not adequately controlled with the Lidoderm patch and with the prescribed pain pills, or if you develop new or worsening symptoms that concern you, please return to the emergency department.    Pain Without a Known Cause WHAT IS PAIN WITHOUT A KNOWN CAUSE? Pain can occur in any part of the body and can range from mild to severe. Sometimes no cause can be found for why you are having pain. Some types of pain that can occur without a known cause include:   Headache.  Back pain.  Abdominal pain.  Neck pain. HOW IS PAIN WITHOUT A KNOWN CAUSE DIAGNOSED?  Your health care provider will try to find the cause of your pain. This may include:  Physical exam.  Medical history.  Blood tests.  Urine tests.  X-rays. If no cause is found, your health care provider may diagnose you with pain without a known cause.  IS THERE TREATMENT FOR PAIN WITHOUT A CAUSE?  Treatment depends on the kind of pain you have. Your health care provider may prescribe medicines to help relieve your pain.  WHAT CAN I DO AT HOME FOR MY PAIN?   Take medicines only as directed by your health care provider.  Stop any activities that cause pain. During periods of severe pain, bed rest may help.  Try to reduce your stress with activities such as yoga or meditation. Talk to your health care provider for other stress-reducing activity recommendations.  Exercise regularly, if approved by your health care provider.  Eat a healthy diet that includes fruits  and vegetables. This may improve pain. Talk to your health care provider if you have any questions about your diet. WHAT IF MY PAIN DOES NOT GET BETTER?  If you have a painful condition and no reason can be found for the pain or the pain gets worse, it is important to follow up with your health care provider. It may be necessary to repeat tests and look further for a possible cause.    This information is not intended to replace advice given to you by your health care provider. Make sure you discuss any questions you have with your health care provider.   Document Released: 08/20/2001 Document Revised: 12/16/2014 Document Reviewed: 04/12/2014 Elsevier Interactive Patient Education [employer].

## 2016-06-22 NOTE — ED Notes (Signed)
Pt discharged this AM, returned with continued pain in her arms and across her upper back, pt is being treated with chemo for breast ca, states the last time she has chemo she had pain onset several days after tx, states she was discharged with percocet but the pharmacy wasn't open yet, husband at bedside

## 2016-06-22 NOTE — ED Notes (Signed)
EDP at bedside  

## 2016-06-22 NOTE — ED Notes (Signed)
Pt resting in bed, family at bedside.

## 2016-06-22 NOTE — ED Notes (Signed)
Pt. States she had vomiting and diarrhea on Wednesday night.  Pt. States having chemo therapy 10 days ago.  Pt. States this has happened in the past when she had chemo before.  Pt. States tonight pain to lt. Shoulder that radiates to back and lower back.  Pt. Also states pain to right hip.

## 2016-06-22 NOTE — ED Provider Notes (Signed)
[facility] Emergency Department Provider Note  ____________________________________________  Time seen: Approximately 8:37 AM  I have reviewed the triage vital signs and the nursing notes.   HISTORY  Chief Complaint Back Pain and Arm Pain   HPI Estrellita Levert is a 75 y.o. female with a history of breast cancer currently undergoing chemotherapy in [place] seen earlier this morning for diffuse body pain associated with her chemo who presents for ongoing pain. Patient was seen earlier this morning by Dr. Karma Greaser. Had essentially unremarkable workup and was offered admission for pain control. Patient has a history of diffuse body pain associated with her chemotherapy. Last chemo was one week ago. Patient endorses that her pain was well controlled to Percocet however only lasted 40 minutes. Patient is now requesting to be admitted for pain control. She has had a similar episode after her first dose of chemotherapy requiring admission to the hospital for pain control. She endorses severe, 10/10, sharp pain, located on her bilateral arms, upper back, lower back, and bilateral hips. The pain is exacerbated by touching and movement. She denies chest pain or shortness of breath, syncope, fever, abdominal pain, nausea, vomiting, dysuria.  Past Medical History  Diagnosis Date  . Vitamin D deficiency   . Mixed hyperlipidemia   . Anxiety state, unspecified   . Depressive disorder, not elsewhere classified   . Obstructive sleep apnea (adult) (pediatric)   . Hypertensive kidney disease, benign   . Cerebral aneurysm, nonruptured 2008    s/p coil and shunt, performed in [place]  . Duodenal ulcer   . Chronic kidney disease, stage III (moderate)   . Acquired cyst of kidney   . Abnormal weight gain   . Fatty liver   . Diverticulosis of colon (without mention of hemorrhage)   . Personal history of colonic polyps 10/22/2002    hyperplastic   . Hypertension   . Duodenal mass   .  Baker's cyst of knee     Left, pt does not remember  . Edema   . Unspecified hypothyroidism   . Grave's disease   . Hypothyroid   . Sleep apnea     off cpap  . Cerebral aneurysm rupture (HCC)     stent  . Radiation 2001    BREAST CA  . Malignant neoplasm of breast (female), unspecified site 2001    right, s/p lumpectomy and XRT  . 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 Breast cancer ([place]) 2001    RT LUMPECTOMY  . Breast cancer of upper-inner quadrant of left female breast ([place]) 04/19/2016    Patient Active Problem List   Diagnosis Date Noted  . Low back pain 06/06/2016  . Elevated troponin 06/01/2016  . Breast cancer of upper-inner quadrant of left female breast ([place]) 04/19/2016  . 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 Acute cystitis without hematuria 03/12/2016  . Osteopenia 02/20/2016  . Abnormal mammogram of left breast 02/20/2016  . Radiation   . Rosacea 12/29/2015  . Mixed stress and urge urinary incontinence 11/28/2015  . OSA (obstructive sleep apnea) 06/29/2015  . Major depressive disorder, recurrent, severe without psychotic features ([place]) 03/16/2015  . Obesity (BMI 30-39.9) 08/18/2014  . Hyperlipidemia 01/05/2014  . Insomnia 10/15/2013  . Anemia 03/24/2013  . Medicare annual wellness visit, subsequent 12/21/2012  . Cerebral aneurysm, nonruptured 11/12/2012  . Obstructive hydrocephalus 11/12/2012  . Hemorrhage into subarachnoid space of neuraxis ([place]) 11/12/2012  . Cerebral  aneurysm 10/30/2012  . S/P VP shunt 10/30/2012  . Hypothyroidism 10/23/2012  . Acute diastolic CHF (congestive heart failure) ([place]) 05/18/2012  . 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 Fatty liver 05/28/2011  . Benign neoplasm of colon 05/13/2011  . Mass of colon 05/13/2011  . Duodenal mass 05/13/2011  . GERD (gastroesophageal reflux disease) 05/13/2011  . Family history of malignant neoplasm of gastrointestinal tract  05/02/2011  . Hypertension 05/02/2011  . History of kidney cancer 05/02/2011  . Breast cancer ([place]) 05/02/2011  . MURMUR 09/13/2009  . GRAVES' DISEASE 09/12/2009  . MIGRAINE HEADACHE 09/12/2009    Past Surgical History  Procedure Laterality Date  . Breast lumpectomy Right     right  . Partial nephrectomy      right  . Shoulder surgery      left  . Cataract extraction      bilateral  . Coronary stent placement      brain  . Femoral artery repair  2011  . Csf shunt  08/17/2008  . Hemiarthroplasty shoulder fracture      left  . Vaginal delivery      3  . Upper gastrointestinal endoscopy  11-10-2014  . Colonoscopy      last 2012.+ TA polyps  . Breast biopsy Left 03/14/2016    stereo  . Radioactive seed guided mastectomy with axillary sentinel lymph node biopsy Left 04/19/2016    Procedure: RADIOACTIVE SEED GUIDED PARTIAL MASTECTOMY WITH AXILLARY SENTINEL LYMPH NODE BIOPSY;  Surgeon: Fanny Skates, MD;  Location: [place];  Service: General;  Laterality: Left;    Current Outpatient Rx  Name  Route  Sig  Dispense  Refill  . ALPRAZolam (XANAX) 0.25 MG tablet   Oral   Take 1 tablet (0.25 mg total) by mouth 3 (three) times daily as needed for anxiety.   60 tablet   4   . aspirin 81 MG tablet   Oral   Take 81 mg by mouth daily. Takes 2 tablets daily         . atorvastatin (LIPITOR) 40 MG tablet      TAKE 1 TABLET DAILY   90 tablet   3   . buPROPion (WELLBUTRIN SR) 100 MG 12 hr tablet   Oral   Take 100 mg by mouth 2 (two) times daily.         . carvedilol (COREG) 12.5 MG tablet      TAKE 1 TABLET TWICE A DAY  WITH MEALS.   180 tablet   3   . cloNIDine (CATAPRES) 0.1 MG tablet   Oral   Take 1 tablet (0.1 mg total) by mouth 3 (three) times daily.   270 tablet   3   . dexamethasone (DECADRON) 4 MG tablet      Take 2 tablets by mouth once a day on the day after chemotherapy and then take 2 tablets two times a day for 2 days. Take with food.    30 tablet   1   . docusate sodium (COLACE) 100 MG capsule      Take 1 tablet once or twice daily as needed for constipation while taking narcotic pain medicine   30 capsule   0   . doxazosin (CARDURA) 8 MG tablet      TAKE 0.5 TABLET DAILY   90 tablet   3   . Esomeprazole Magnesium (NEXIUM PO)   Oral   Take 20 mg by mouth every morning.          Marland Kitchen  fesoterodine (TOVIAZ) 4 MG TB24 tablet   Oral   Take 4 mg by mouth daily.         . hydrALAZINE (APRESOLINE) 100 MG tablet      TAKE 1 TABLET 3 TIMES A DAY   270 tablet   3   . levothyroxine (SYNTHROID) 175 MCG tablet   Oral   Take 1 tablet (175 mcg total) by mouth daily.   90 tablet   3   . lidocaine (LIDODERM) 5 %   Transdermal   Place 1 patch onto the skin daily. Leave for 12hr   15 patch   6   . lidocaine-prilocaine (EMLA) cream      Apply to affected area as needed   30 g   3   . lisinopril (PRINIVIL,ZESTRIL) 10 MG tablet   Oral   Take 10 mg by mouth daily.         Marland Kitchen LORazepam (ATIVAN) 0.5 MG tablet   Oral   Take 1 tablet (0.5 mg total) by mouth every 6 (six) hours as needed (Nausea or vomiting).   30 tablet   0   . mirtazapine (REMERON SOL-TAB) 45 MG disintegrating tablet   Oral   Take 45 mg by mouth at bedtime.         . Multiple Vitamin (MULTIVITAMIN) tablet   Oral   Take 1 tablet by mouth daily.           . nitroGLYCERIN (NITROSTAT) 0.4 MG SL tablet   Sublingual   Place 1 tablet (0.4 mg total) under the tongue every 5 (five) minutes as needed for chest pain.   25 tablet   3   . ondansetron (ZOFRAN) 4 MG tablet      Take 1-2 tabs by mouth every 8 hours as needed for nausea/vomiting   30 tablet   0   . oxyCODONE-acetaminophen (ROXICET) 5-325 MG tablet   Oral   Take 1-2 tablets by mouth every 4 (four) hours as needed for severe pain.   30 tablet   0   . prochlorperazine (COMPAZINE) 10 MG tablet   Oral   Take 1 tablet (10 mg total) by mouth every 6 (six) hours as needed  (Nausea or vomiting).   30 tablet   1   . rOPINIRole (REQUIP) 1 MG tablet      TAKE 1 TABLET (1 MG TOTAL) BY MOUTH DAILY AS NEEDED.   90 tablet   1   . traZODone (DESYREL) 50 MG tablet   Oral   Take 2 tablets (100 mg total) by mouth at bedtime.   180 tablet   1     Allergies Amlodipine  Family History  Problem Relation Age of Onset  . Colon cancer Sister   . Cancer Sister     colon  . Heart disease Father   . Esophageal cancer Neg Hx   . Rectal cancer Neg Hx   . Stomach cancer Neg Hx   . Heart disease Mother     Social History Social History  Substance Use Topics  . Smoking status: Former Smoker -- 1.00 packs/day for 43 years    Types: Cigarettes    Start date: 04/01/1964    Quit date: 11/24/2007  . Smokeless tobacco: Never Used     Comment: quit smoking 7 years ago  . Alcohol Use: 0.6 oz/week    1 Glasses of wine per week     Comment: occasssionally    Review of Systems  Constitutional:  Negative for fever. Eyes: Negative for visual changes. ENT: Negative for sore throat. Cardiovascular: Negative for chest pain. Respiratory: Negative for shortness of breath. Gastrointestinal: Negative for abdominal pain, vomiting or diarrhea. Genitourinary: Negative for dysuria. Musculoskeletal: + back, b/l arm, and hip pain Skin: Negative for rash. Neurological: Negative for headaches, weakness or numbness.  ____________________________________________   PHYSICAL EXAM:  VITAL SIGNS: Filed Vitals:   06/22/16 0930 06/22/16 1016  BP: 185/75 173/87  Pulse:    Temp:    Resp: 10     Constitutional: Alert and oriented. Well appearing and in no apparent distress. HEENT:      Head: Normocephalic and atraumatic.         Eyes: Conjunctivae are normal. Sclera is non-icteric. EOMI. PERRL      Mouth/Throat: Mucous membranes are moist.       Neck: Supple with no signs of meningismus. Cardiovascular: Regular rate and rhythm. No murmurs, gallops, or rubs. 2+ symmetrical  distal pulses are present in all extremities. No JVD. Respiratory: Normal respiratory effort. Lungs are clear to auscultation bilaterally. No wheezes, crackles, or rhonchi.  Gastrointestinal: Soft, non tender, and non distended with positive bowel sounds. No rebound or guarding. Genitourinary: No CVA tenderness. Musculoskeletal: ttp on b/l arms, diffuse upper and lower back, no midline c/t/l spine.  Neurologic: Normal speech and language. Face is symmetric. Moving all extremities. No gross focal neurologic deficits are appreciated. Skin: Skin is warm, dry and intact. No rash noted. Psychiatric: Mood and affect are normal. Speech and behavior are normal.  ____________________________________________   LABS (all labs ordered are listed, but only abnormal results are displayed)  Labs Reviewed  TROPONIN I   ____________________________________________  EKG  ED ECG REPORT I, Rudene Re, the attending physician, personally viewed and interpreted this ECG.  Normal sinus rhythm, rate of 70, normal intervals, normal axis, no ST elevations or depressions. ____________________________________________  RADIOLOGY  None ____________________________________________   PROCEDURES  Procedure(s) performed: None Critical Care performed:  None ____________________________________________   INITIAL IMPRESSION / ASSESSMENT AND PLAN / ED COURSE  75 y.o. female with a history of breast cancer currently undergoing chemotherapy in [place] seen earlier this morning for diffuse body pain associated with her chemo who presents for ongoing pain requesting admission. I tried to explain to patient the risks associated with admission in a patient who is currently immune suppressed and told her that we could take care of her pain by separating Tylenol and oxycodone and increasing the dose of both at this time. Patient has refused outpatient treatment and requests to be admitted to the hospital at  this time as she had been offered by Dr. Karma Greaser. Will not repeat labs at this time other then send a second troponin is the first one was mildly elevated and an EKG to rule out possible other causes of upper back pain. Will start patient on IV morphine and discussed with the hospitalist for admission. We'll also give patient her antihypertensive home meds at this time as her blood pressures systolics is in the 123456.  Pertinent labs & imaging results that were available during my care of the patient were reviewed by me and considered in my medical decision making (see chart for details).  _________________________ 10:22 AM on 06/22/2016 -----------------------------------------  Troponin normal. EKG nonischemic. Patient's pain is better controlled with IV morphine. Discussed with hospitalist who accepted patient for admission.  ____________________________________________   FINAL CLINICAL IMPRESSION(S) / ED DIAGNOSES  Final diagnoses:  Generalized pain  Immunosuppressed status ([place])  NEW MEDICATIONS STARTED DURING THIS VISIT:  New Prescriptions   No medications on file     Note:  This document was prepared using Dragon voice recognition software and may include unintentional dictation errors.    Rudene Re, MD 06/22/16 1022

## 2016-06-23 DIAGNOSIS — R52 Pain, unspecified: Secondary | ICD-10-CM | POA: Diagnosis not present

## 2016-06-23 DIAGNOSIS — I10 Essential (primary) hypertension: Secondary | ICD-10-CM | POA: Diagnosis not present

## 2016-06-23 DIAGNOSIS — E876 Hypokalemia: Secondary | ICD-10-CM | POA: Diagnosis not present

## 2016-06-23 DIAGNOSIS — N179 Acute kidney failure, unspecified: Secondary | ICD-10-CM | POA: Diagnosis not present

## 2016-06-23 LAB — CBC
HCT: 31.1 % — ABNORMAL LOW (ref 35.0–47.0)
Hemoglobin: 10.6 g/dL — ABNORMAL LOW (ref 12.0–16.0)
MCH: 30.9 pg (ref 26.0–34.0)
MCHC: 34 g/dL (ref 32.0–36.0)
MCV: 90.7 fL (ref 80.0–100.0)
PLATELETS: 90 10*3/uL — AB (ref 150–440)
RBC: 3.43 MIL/uL — AB (ref 3.80–5.20)
RDW: 16.6 % — AB (ref 11.5–14.5)
WBC: 12.4 10*3/uL — ABNORMAL HIGH (ref 3.6–11.0)

## 2016-06-23 LAB — BASIC METABOLIC PANEL
Anion gap: 4 — ABNORMAL LOW (ref 5–15)
BUN: 18 mg/dL (ref 6–20)
CHLORIDE: 114 mmol/L — AB (ref 101–111)
CO2: 25 mmol/L (ref 22–32)
CREATININE: 1.06 mg/dL — AB (ref 0.44–1.00)
Calcium: 8.1 mg/dL — ABNORMAL LOW (ref 8.9–10.3)
GFR calc Af Amer: 59 mL/min — ABNORMAL LOW (ref >60)
GFR calc non Af Amer: 51 mL/min — ABNORMAL LOW (ref >60)
GLUCOSE: 97 mg/dL (ref 65–99)
Potassium: 3.3 mmol/L — ABNORMAL LOW (ref 3.5–5.1)
Sodium: 143 mmol/L (ref 135–145)

## 2016-06-23 LAB — GLUCOSE, CAPILLARY: Glucose-Capillary: 104 mg/dL — ABNORMAL HIGH (ref 65–99)

## 2016-06-23 MED ORDER — CLONIDINE HCL 0.1 MG PO TABS
0.2000 mg | ORAL_TABLET | Freq: Three times a day (TID) | ORAL | Status: DC
  Administered 2016-06-23 – 2016-06-30 (×22): 0.2 mg via ORAL
  Filled 2016-06-23 (×22): qty 2

## 2016-06-23 MED ORDER — LIDOCAINE 5 % EX PTCH
1.0000 | MEDICATED_PATCH | CUTANEOUS | Status: DC
  Filled 2016-06-23 (×8): qty 1

## 2016-06-23 MED ORDER — OXYCODONE-ACETAMINOPHEN 5-325 MG PO TABS: 1.0000 | ORAL_TABLET | Freq: Three times a day (TID) | ORAL | Status: DC | PRN

## 2016-06-23 NOTE — Progress Notes (Signed)
   06/23/16 1010  Clinical Encounter Type  Visited With Patient  Visit Type Initial  Referral From Nurse  Consult/Referral To Chaplain  Spiritual Encounters  Spiritual Needs Literature;Prayer  Stress Factors  Patient Stress Factors Exhausted  Advance Directives (For Healthcare)  Does patient have an advance directive? No  Would patient like information on creating an advanced directive? Yes - Scientist, clinical (histocompatibility and immunogenetics) given  Type of Paramedic of [place];Living will  Does patient want to make changes to advanced directive? Yes - information given  Visited w/patient. Discussed her past health issues (e.g. 4x cancer survivor). She advised that she would take AD material home tomorrow to complete there. We discussed her renewed spiritual needs and helpful Scripture recommendations to gain spiritual strength. Chap. Hamed Debella G. [place]

## 2016-06-23 NOTE — Progress Notes (Signed)
[facility] [place] NAME: Cassidy Ortiz    MR#:  [id number]  DATE OF BIRTH:  [birth year]  SUBJECTIVE:  CHIEF COMPLAINT:   Chief Complaint  Patient presents with  . Back Pain  . Arm Pain  alert, not in much pain.  REVIEW OF SYSTEMS:  Review of Systems  Constitutional: Negative for fever, weight loss, malaise/fatigue and diaphoresis.  HENT: Negative for ear discharge, ear pain, hearing loss, nosebleeds, sore throat and tinnitus.   Eyes: Negative for blurred vision and pain.  Respiratory: Negative for cough, hemoptysis, shortness of breath and wheezing.   Cardiovascular: Negative for chest pain, palpitations, orthopnea and leg swelling.  Gastrointestinal: Positive for nausea. Negative for heartburn, vomiting, abdominal pain, diarrhea, constipation and blood in stool.  Genitourinary: Negative for dysuria, urgency and frequency.  Musculoskeletal: Negative for myalgias and back pain.  Skin: Negative for itching and rash.  Neurological: Negative for dizziness, tingling, tremors, focal weakness, seizures, weakness and headaches.  Psychiatric/Behavioral: Negative for depression. The patient is not nervous/anxious.    DRUG ALLERGIES:   Allergies  Allergen Reactions  . Amlodipine     Leg swelling   VITALS:  Blood pressure 185/70, pulse 74, temperature 98.8 F (37.1 C), temperature source Oral, resp. rate 18, height 5\' 7"  (1.702 m), weight 90.719 kg (200 lb), SpO2 94 %. PHYSICAL EXAMINATION:  Physical Exam  Constitutional: She is oriented to person, place, and time and well-developed, well-nourished, and in no distress.  HENT:  Head: Normocephalic and atraumatic.  Eyes: Conjunctivae and EOM are normal. Pupils are equal, round, and reactive to light.  Neck: Normal range of motion. Neck supple. No tracheal deviation present. No thyromegaly present.  Cardiovascular: Normal rate, regular rhythm and normal heart sounds.   Pulmonary/Chest: Effort  normal and breath sounds normal. No respiratory distress. She has no wheezes. She exhibits no tenderness.  Abdominal: Soft. Bowel sounds are normal. She exhibits no distension. There is no tenderness.  Musculoskeletal: Normal range of motion.  Neurological: She is alert and oriented to person, place, and time. No cranial nerve deficit.  Skin: Skin is warm and dry. No rash noted.  Psychiatric: Mood and affect normal.   LABORATORY PANEL:   CBC  Recent Labs Lab 06/23/16 0500  WBC 12.4*  HGB 10.6*  HCT 31.1*  PLT 90*   ------------------------------------------------------------------------------------------------------------------ Chemistries   Recent Labs Lab 06/22/16 0254 06/23/16 0500  NA 142 143  K 3.2* 3.3*  CL 112* 114*  CO2 22 25  GLUCOSE 119* 97  BUN 34* 18  CREATININE 1.36* 1.06*  CALCIUM 8.2* 8.1*  AST 15  --   ALT 12*  --   ALKPHOS 120  --   BILITOT 0.3  --    Impression and plan: 75 y.o. female with a known history of breast cancer currently undergoing chemotherapy at [facility] Being admitted for  * Uncontrolled generalized pain: - Likely from recent chemotherapy - Much better control - Tylenol for mild pain, Percocet for moderate-severe.  Stop Dilaudid  - Start Lidoderm patch and likely discharge on same tomorrow  * Uncontrolled hypertension: Likely secondary to severe pain on presentation.  - Continue Coreg, Cardura, hydralazine and lisinopril - We will order hydralazine 10 mg IV every 6 hour as needed for better blood pressure control - Increase the dose of clonidine  * Hypokalemia - Replete and recheck  * Acute on Chronic kidney disease stage III - Likely due to dehydration, prerenal etiology - She  did have some diarrhea, nausea and vomiting 2-3 days before, likely from her chemotherapy - Improving with IV hydration  * Elevated troponin: due to supply demand ischemia secondary to the accelerated hypertension. No further cardiac workup done  in the hospital.  * History of breast cancer follow-up with oncology as outpatient [name]' disease. Continue levothyroxine * History of sleep apnea * Depression. Continued psychiatric medications * Thrombocytopenia likely from recent chemotherapy    All the records are reviewed and case discussed with Care Management/Social Worker. Management plans discussed with the patient, family and they are in agreement.  CODE STATUS: DO NOT RESUSCITATE  TOTAL TIME TAKING CARE OF THIS PATIENT: 35 minutes.   More than 50% of the time was spent in counseling/coordination of care: YES  POSSIBLE D/C IN AM, DEPENDING ON CLINICAL CONDITION. And pain control   [facility], Cassidy Ortiz M.D on 06/23/2016 at 7:48 AM  Between 7am to 6pm - Pager - [phone]  After 6pm go to [email or web address] - Proofreader  [employer] [facility] [facility]  Office  [phone]  CC: Primary care physician; Cassidy Mast, MD  Note: This dictation was prepared with Dragon dictation along with smaller phrase technology. Any transcriptional errors that result from this process are unintentional.

## 2016-06-23 NOTE — Care Management Obs Status (Signed)
[facility] NOTIFICATION   Patient Details  Name: Cassidy Ortiz MRN: [id number] Date of Birth: 04-20-1941   Medicare Observation Status Notification Given:  Yes    Zenith Lamphier A, RN 06/23/2016, 8:22 AM

## 2016-06-24 DIAGNOSIS — I161 Hypertensive emergency: Secondary | ICD-10-CM

## 2016-06-24 DIAGNOSIS — Z9889 Other specified postprocedural states: Secondary | ICD-10-CM | POA: Diagnosis not present

## 2016-06-24 DIAGNOSIS — Z87891 Personal history of nicotine dependence: Secondary | ICD-10-CM | POA: Diagnosis not present

## 2016-06-24 DIAGNOSIS — I5032 Chronic diastolic (congestive) heart failure: Secondary | ICD-10-CM | POA: Diagnosis present

## 2016-06-24 DIAGNOSIS — D6959 Other secondary thrombocytopenia: Secondary | ICD-10-CM | POA: Diagnosis present

## 2016-06-24 DIAGNOSIS — Z9842 Cataract extraction status, left eye: Secondary | ICD-10-CM | POA: Diagnosis not present

## 2016-06-24 DIAGNOSIS — I130 Hypertensive heart and chronic kidney disease with heart failure and stage 1 through stage 4 chronic kidney disease, or unspecified chronic kidney disease: Secondary | ICD-10-CM | POA: Diagnosis present

## 2016-06-24 DIAGNOSIS — E039 Hypothyroidism, unspecified: Secondary | ICD-10-CM | POA: Diagnosis present

## 2016-06-24 DIAGNOSIS — Z9012 Acquired absence of left breast and nipple: Secondary | ICD-10-CM | POA: Diagnosis not present

## 2016-06-24 DIAGNOSIS — I160 Hypertensive urgency: Secondary | ICD-10-CM | POA: Diagnosis present

## 2016-06-24 DIAGNOSIS — E876 Hypokalemia: Secondary | ICD-10-CM | POA: Diagnosis not present

## 2016-06-24 DIAGNOSIS — Z800 Family history of malignant neoplasm of digestive organs: Secondary | ICD-10-CM | POA: Diagnosis not present

## 2016-06-24 DIAGNOSIS — I421 Obstructive hypertrophic cardiomyopathy: Secondary | ICD-10-CM | POA: Diagnosis not present

## 2016-06-24 DIAGNOSIS — C50219 Malignant neoplasm of upper-inner quadrant of unspecified female breast: Secondary | ICD-10-CM | POA: Diagnosis present

## 2016-06-24 DIAGNOSIS — R52 Pain, unspecified: Secondary | ICD-10-CM | POA: Diagnosis not present

## 2016-06-24 DIAGNOSIS — Z905 Acquired absence of kidney: Secondary | ICD-10-CM | POA: Diagnosis not present

## 2016-06-24 DIAGNOSIS — F329 Major depressive disorder, single episode, unspecified: Secondary | ICD-10-CM | POA: Diagnosis present

## 2016-06-24 DIAGNOSIS — I248 Other forms of acute ischemic heart disease: Secondary | ICD-10-CM | POA: Diagnosis present

## 2016-06-24 DIAGNOSIS — N183 Chronic kidney disease, stage 3 (moderate): Secondary | ICD-10-CM | POA: Diagnosis present

## 2016-06-24 DIAGNOSIS — Z923 Personal history of irradiation: Secondary | ICD-10-CM | POA: Diagnosis not present

## 2016-06-24 DIAGNOSIS — Z9841 Cataract extraction status, right eye: Secondary | ICD-10-CM | POA: Diagnosis not present

## 2016-06-24 DIAGNOSIS — E0500 Thyrotoxicosis with diffuse goiter without thyrotoxic crisis or storm: Secondary | ICD-10-CM | POA: Diagnosis present

## 2016-06-24 DIAGNOSIS — G4733 Obstructive sleep apnea (adult) (pediatric): Secondary | ICD-10-CM | POA: Diagnosis present

## 2016-06-24 DIAGNOSIS — Z955 Presence of coronary angioplasty implant and graft: Secondary | ICD-10-CM | POA: Diagnosis not present

## 2016-06-24 DIAGNOSIS — N179 Acute kidney failure, unspecified: Secondary | ICD-10-CM | POA: Diagnosis not present

## 2016-06-24 DIAGNOSIS — C50919 Malignant neoplasm of unspecified site of unspecified female breast: Secondary | ICD-10-CM | POA: Diagnosis not present

## 2016-06-24 DIAGNOSIS — Z86010 Personal history of colonic polyps: Secondary | ICD-10-CM | POA: Diagnosis not present

## 2016-06-24 DIAGNOSIS — E860 Dehydration: Secondary | ICD-10-CM | POA: Diagnosis present

## 2016-06-24 DIAGNOSIS — Z85528 Personal history of other malignant neoplasm of kidney: Secondary | ICD-10-CM | POA: Diagnosis not present

## 2016-06-24 LAB — CBC
HCT: 30.9 % — ABNORMAL LOW (ref 35.0–47.0)
Hemoglobin: 10.5 g/dL — ABNORMAL LOW (ref 12.0–16.0)
MCH: 30.7 pg (ref 26.0–34.0)
MCHC: 34 g/dL (ref 32.0–36.0)
MCV: 90.3 fL (ref 80.0–100.0)
Platelets: 79 10*3/uL — ABNORMAL LOW (ref 150–440)
RBC: 3.43 MIL/uL — ABNORMAL LOW (ref 3.80–5.20)
RDW: 15.9 % — AB (ref 11.5–14.5)
WBC: 18.1 10*3/uL — AB (ref 3.6–11.0)

## 2016-06-24 LAB — BASIC METABOLIC PANEL
ANION GAP: 5 (ref 5–15)
BUN: 17 mg/dL (ref 6–20)
CALCIUM: 8.2 mg/dL — AB (ref 8.9–10.3)
CHLORIDE: 113 mmol/L — AB (ref 101–111)
CO2: 25 mmol/L (ref 22–32)
Creatinine, Ser: 1.05 mg/dL — ABNORMAL HIGH (ref 0.44–1.00)
GFR calc non Af Amer: 51 mL/min — ABNORMAL LOW (ref >60)
GFR, EST AFRICAN AMERICAN: 60 mL/min — AB (ref >60)
GLUCOSE: 121 mg/dL — AB (ref 65–99)
POTASSIUM: 3.3 mmol/L — AB (ref 3.5–5.1)
Sodium: 143 mmol/L (ref 135–145)

## 2016-06-24 LAB — GLUCOSE, CAPILLARY
GLUCOSE-CAPILLARY: 144 mg/dL — AB (ref 65–99)
Glucose-Capillary: 81 mg/dL (ref 65–99)

## 2016-06-24 LAB — MAGNESIUM: MAGNESIUM: 1.3 mg/dL — AB (ref 1.7–2.4)

## 2016-06-24 MED ORDER — MAGNESIUM SULFATE 4 GM/100ML IV SOLN
4.0000 g | Freq: Once | INTRAVENOUS | Status: AC
  Administered 2016-06-24: 4 g via INTRAVENOUS
  Filled 2016-06-24: qty 100

## 2016-06-24 MED ORDER — ENOXAPARIN SODIUM 40 MG/0.4ML ~~LOC~~ SOLN
40.0000 mg | SUBCUTANEOUS | Status: DC
  Administered 2016-06-24 – 2016-06-29 (×6): 40 mg via SUBCUTANEOUS
  Filled 2016-06-24 (×6): qty 0.4

## 2016-06-24 MED ORDER — LISINOPRIL 20 MG PO TABS
20.0000 mg | ORAL_TABLET | Freq: Every day | ORAL | Status: DC
  Administered 2016-06-25 – 2016-06-27 (×3): 20 mg via ORAL
  Filled 2016-06-24 (×3): qty 1

## 2016-06-24 MED ORDER — HYDRALAZINE HCL 20 MG/ML IJ SOLN
10.0000 mg | INTRAMUSCULAR | Status: DC | PRN
  Administered 2016-06-24 – 2016-06-29 (×11): 10 mg via INTRAVENOUS
  Filled 2016-06-24 (×11): qty 1

## 2016-06-24 MED ORDER — POTASSIUM CHLORIDE 20 MEQ PO PACK
40.0000 meq | PACK | Freq: Once | ORAL | Status: AC
  Administered 2016-06-24: 40 meq via ORAL
  Filled 2016-06-24: qty 2

## 2016-06-24 MED ORDER — NITROPRUSSIDE SODIUM 25 MG/ML IV SOLN
0.0000 ug/kg/min | INTRAVENOUS | Status: DC
  Administered 2016-06-24: 0.3 ug/kg/min via INTRAVENOUS
  Administered 2016-06-25 – 2016-06-26 (×2): 0.6 ug/kg/min via INTRAVENOUS
  Administered 2016-06-27: 0.1 ug/kg/min via INTRAVENOUS
  Filled 2016-06-24 (×4): qty 2

## 2016-06-24 NOTE — Progress Notes (Signed)
[facility] [place] NAME: Samay Rolfe    MR#:  [id number]  DATE OF BIRTH:  03/22/1941  SUBJECTIVE:  CHIEF COMPLAINT:   Chief Complaint  Patient presents with  . Back Pain  . Arm Pain  slept minimal last night, but finally was able to sleep around 5 AM and slept for about an hour or 2.  As her blood pressure had been very high all night and same yesterday.  Last blood pressure while I was in the room was 218/81 REVIEW OF SYSTEMS:  Review of Systems  Constitutional: Negative for fever, weight loss, malaise/fatigue and diaphoresis.  HENT: Negative for ear discharge, ear pain, hearing loss, nosebleeds, sore throat and tinnitus.   Eyes: Negative for blurred vision and pain.  Respiratory: Negative for cough, hemoptysis, shortness of breath and wheezing.   Cardiovascular: Negative for chest pain, palpitations, orthopnea and leg swelling.  Gastrointestinal: Negative for heartburn, vomiting, abdominal pain, diarrhea, constipation and blood in stool.  Genitourinary: Negative for dysuria, urgency and frequency.  Musculoskeletal: Negative for myalgias and back pain.  Skin: Negative for itching and rash.  Neurological: Negative for dizziness, tingling, tremors, focal weakness, seizures, weakness and headaches.  Psychiatric/Behavioral: Negative for depression. The patient has insomnia. The patient is not nervous/anxious.    DRUG ALLERGIES:   Allergies  Allergen Reactions  . Amlodipine     Leg swelling   VITALS:  Blood pressure 218/81, pulse 57, temperature 98.3 F (36.8 C), temperature source Oral, resp. rate 17, height 5\' 7"  (1.702 m), weight 95.528 kg (210 lb 9.6 oz), SpO2 94 %. PHYSICAL EXAMINATION:  Physical Exam  Constitutional: She is oriented to person, place, and time and well-developed, well-nourished, and in no distress.  HENT:  Head: Normocephalic and atraumatic.  Eyes: Conjunctivae and EOM are normal. Pupils are equal, round, and  reactive to light.  Neck: Normal range of motion. Neck supple. No tracheal deviation present. No thyromegaly present.  Cardiovascular: Normal rate, regular rhythm and normal heart sounds.   Pulmonary/Chest: Effort normal and breath sounds normal. No respiratory distress. She has no wheezes. She exhibits no tenderness.  Abdominal: Soft. Bowel sounds are normal. She exhibits no distension. There is no tenderness.  Musculoskeletal: Normal range of motion.  Neurological: She is alert and oriented to person, place, and time. No cranial nerve deficit.  Skin: Skin is warm and dry. No rash noted.  Psychiatric: Mood and affect normal.   LABORATORY PANEL:   CBC  Recent Labs Lab 06/24/16 0631  WBC 18.1*  HGB 10.5*  HCT 30.9*  PLT 79*   ------------------------------------------------------------------------------------------------------------------ Chemistries   Recent Labs Lab 06/22/16 0254  06/24/16 0631  NA 142  < > 143  K 3.2*  < > 3.3*  CL 112*  < > 113*  CO2 22  < > 25  GLUCOSE 119*  < > 121*  BUN 34*  < > 17  CREATININE 1.36*  < > 1.05*  CALCIUM 8.2*  < > 8.2*  AST 15  --   --   ALT 12*  --   --   ALKPHOS 120  --   --   BILITOT 0.3  --   --   < > = values in this interval not displayed.   Impression and plan: 75 y.o. female with a known history of breast cancer currently undergoing chemotherapy at [facility] Being admitted for  * Hypertensive emergency:  - Continue Coreg, Cardura, hydralazine and lisinopril -  also on hydralazine 10 mg IV every 6 hour as needed for better blood pressure control - Increased the dose of clonidine yesterday but still blood pressure is anywhere from 180-220s - We will transfer her to stepdown status for IV nitroprusside drip for better blood pressure control - Obtain 2-D echo and cardiology consult - Check aldosterone-renin levels to look for secondary causes for uncontrolled hypertension - Low salt diet  * Hypokalemia - Replete and  recheck  * Acute on Chronic kidney disease stage III - Likely due to dehydration, prerenal etiology - Resolved with hydration  * Elevated troponin: due to supply demand ischemia secondary to the accelerated hypertension. No further cardiac workup done in the hospital.  * History of breast cancer follow-up with oncology as outpatient [name]' disease. Continue levothyroxine * History of sleep apnea * Depression. Continued psychiatric medications * Thrombocytopenia likely from recent chemotherapy    All the records are reviewed and case discussed with Care Management/Social Worker. Management plans discussed with the patient, family and they are in agreement.  CODE STATUS: DO NOT RESUSCITATE  TOTAL TIME (CRITICAL CARE) TAKING CARE OF THIS PATIENT: 35 minutes. She will need close monitoring in the stepdown status in the ICU while on nitroprusside drip  More than 50% of the time was spent in counseling/coordination of care: YES  POSSIBLE D/C IN 1-2 days, DEPENDING ON CLINICAL CONDITION. And BP control   Ernestina Joe M.D on 06/24/2016 at 8:24 AM  Between 7am to 6pm - Pager - [phone]  After 6pm go to [email or web address] - Proofreader  [facility]  Office  [phone]  CC: Primary care physician; Rica Mast, MD  Note: This dictation was prepared with Dragon dictation along with smaller phrase technology. Any transcriptional errors that result from this process are unintentional.

## 2016-06-24 NOTE — Consult Note (Signed)
Cardiology Consult    Patient ID: Cassidy Ortiz MRN: [id number], DOB/AGE: [birth year]   Admit date: 06/22/2016 Date of Consult: 06/24/2016  Primary Physician: Rica Mast, MD Reason for Consult: Elevated BP Primary Cardiologist: Dr. Rockey Situ Requesting Provider: Dr. Manuella Ghazi   History of Present Illness    Cassidy Ortiz is a 75 y.o. 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   She reports having received her second dose of chemo two weeks ago (06/13/2016) and developed acute pain and vomiting 7 days later. Had similar symptoms 1 week after the initial dose as well.  She came to the ED on 7/15 and was treated with pain medication that morning and discharged home, but represented later that afternoon due to her recurrent pain.   She describes the pain as "coming on like a light-switch" and leaving like such. She denies any chest discomfort, palpitations, or dyspnea. She was unable to keep down her medications secondary to vomiting on 7/14, but has been able to do so since.  Initial labs showed a WBC of 6.5, Hgb 11.0,  Platelets 119. K+ at 3.2 and creatinine stable at 1.36. Cyclic troponin values have been 0.06, 0.05, and < 0.03.  EKG with NSR, HR 66, no acute ST or T-wave changes.  At time of admission her BP was elevated to 196/75, thought to be secondary to her severe pain. She was continued on Clonidine (0.1mg  TID), Cardura (4mg  daily --> increased to 8mg  daily at time of admission), Coreg (12.5mg  BID), Hydralazine (100mg  TID), and Lisinopril (10 mg daily). Clonidine was increased to 0.2mg  TID on 06/23/2016.  This morning, her BP was significantly elevated  to 218/81. She was therefore transferred to the ICU and started on IV Nitroprusside. SBP has since been in the 150's - 170's. She denies having any episodes of acute pain since 7/15. Feeling well at this time, without nausea, vomiting, dizziness, or headaches.  She reports having a similar episode when her Port was placed, saying her SBP went as high as 240. Quickly improved with IV medication. Prior to starting chemo, her BP was well controlled according to the patient. At her last office visit with Dr. Rockey Situ in 12/2015, her BP was 110/62 and it was recommended if her BP remain low, she decrease Hydralazine to 50 mg TID.   Past Medical History   Past Medical History  Diagnosis Date  . Vitamin D deficiency   . Mixed hyperlipidemia   . Anxiety state, unspecified   . Depressive disorder, not elsewhere classified   . Obstructive sleep apnea (adult) (pediatric)   . Hypertensive kidney disease, benign   . Cerebral aneurysm, nonruptured 2008    s/p coil and shunt, performed in [place]  . Duodenal ulcer   . Chronic kidney disease, stage III (moderate)   . Acquired cyst of kidney   . Abnormal weight gain   . Fatty liver   . Diverticulosis of colon (without mention of hemorrhage)   . Personal history of colonic polyps 10/22/2002    hyperplastic   . Hypertension   . Duodenal mass   . Baker's cyst of knee     Left, pt does not remember  . Edema   .  Unspecified hypothyroidism   . Grave's disease   . Hypothyroid   . Sleep apnea     off cpap  . Cerebral aneurysm rupture (HCC)     stent  . Radiation 2001    BREAST CA  . Malignant neoplasm of breast (female), unspecified site 2001    right, s/p lumpectomy and XRT  . 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 Breast cancer ([place]) 2001    RT LUMPECTOMY  . Breast cancer of upper-inner quadrant of left female breast ([place]) 04/19/2016    Past Surgical History  Procedure Laterality Date  . Breast lumpectomy  Right     right  . Partial nephrectomy      right  . Shoulder surgery      left  . Cataract extraction      bilateral  . Coronary stent placement      brain  . Femoral artery repair  2011  . Csf shunt  08/17/2008  . Hemiarthroplasty shoulder fracture      left  . Vaginal delivery      3  . Upper gastrointestinal endoscopy  11-10-2014  . Colonoscopy      last 2012.+ TA polyps  . Breast biopsy Left 03/14/2016    stereo  . Radioactive seed guided mastectomy with axillary sentinel lymph node biopsy Left 04/19/2016    Procedure: RADIOACTIVE SEED GUIDED PARTIAL MASTECTOMY WITH AXILLARY SENTINEL LYMPH NODE BIOPSY;  Surgeon: Fanny Skates, MD;  Location: [facility];  Service: General;  Laterality: Left;     Allergies  Allergies  Allergen Reactions  . Amlodipine     Leg swelling    Inpatient Medications    . aspirin EC  162 mg Oral Daily  . atorvastatin  40 mg Oral Daily  . buPROPion  100 mg Oral BID  . carvedilol  12.5 mg Oral BID WC  . cloNIDine  0.2 mg Oral TID  . dexamethasone  4 mg Oral Daily  . docusate sodium  100 mg Oral BID  . doxazosin  8 mg Oral Daily  . fesoterodine  4 mg Oral Daily  . heparin  5,000 Units Subcutaneous Q8H  . hydrALAZINE  100 mg Oral TID  . levothyroxine  175 mcg Oral QAC breakfast  . lidocaine  1 patch Transdermal Q24H  . lidocaine-prilocaine   Topical BID  . lisinopril  10 mg Oral Daily  . mirtazapine  45 mg Oral QHS  . multivitamin with minerals  1 tablet Oral Daily  . pantoprazole  40 mg Oral Daily  . rOPINIRole  1 mg Oral TID  . traZODone  100 mg Oral QHS    Family History    Family History  Problem Relation Age of Onset  . Colon cancer Sister   . Cancer Sister     colon  . Heart disease Father   . Esophageal cancer Neg Hx   . Rectal cancer Neg Hx   . Stomach cancer Neg Hx   . Heart disease Mother     Social History    Social History   Social History  . Marital Status: Married    Spouse Name: N/A  .  Number of Children: 3  . Years of Education: N/A   Occupational History  . retired    Social History Main Topics  . Smoking status: Former Smoker -- 1.00 packs/day for 43 years    Types: Cigarettes    Start date: 04/01/1964  Quit date: 11/24/2007  . Smokeless tobacco: Never Used     Comment: quit smoking 7 years ago  . Alcohol Use: 0.6 oz/week    1 Glasses of wine per week     Comment: occasssionally  . Drug Use: No  . Sexual Activity: Not Currently   Other Topics Concern  . Not on file   Social History Narrative   Lives in [place] with husband. From [place]. Children live [place] and [place].   Pets - 1 cat in home.      Work - retired Theme park manager, Educational psychologist   Diet - regular, limited calories           Review of Systems    General:  No chills, fever, night sweats or weight changes. Positive for generalized pain.  Cardiovascular:  No chest pain, dyspnea on exertion, edema, orthopnea, palpitations, paroxysmal nocturnal dyspnea. Dermatological: No rash, lesions/masses Respiratory: No cough, dyspnea Urologic: No hematuria, dysuria Abdominal:   No diarrhea, bright red blood per rectum, melena, or hematemesis. Positive for nausea and vomiting. Neurologic:  No visual changes, wkns, changes in mental status. All other systems reviewed and are otherwise negative except as noted above.  Physical Exam    Blood pressure 158/128, pulse 68, temperature 97.7 F (36.5 C), temperature source Oral, resp. rate 15, height 5\' 7"  (1.702 m), weight 210 lb 8.6 oz (95.5 kg), SpO2 97 %.  General: Pleasant, Caucasian female appearing in NAD Psych: Normal affect. Neuro: Alert and oriented X 3. Moves all extremities spontaneously. HEENT: Normal  Neck: Supple without bruits or JVD. Lungs:  Resp regular and unlabored, CTA without wheezing or rales. Heart: RRR no s3, s4, 2/6 SEM appreciated throughout (most noted at LUSB). Port in place. Abdomen: Soft, non-tender, non-distended, BS + x 4.  Extremities: No  clubbing, cyanosis or edema. DP/PT/Radials 2+ and equal bilaterally.  Labs    Troponin (Point of Care Test) No results for input(s): TROPIPOC in the last 72 hours.  Recent Labs  06/22/16 0254 06/22/16 0851  TROPONINI 0.05* <0.03   Lab Results  Component Value Date   WBC 18.1* 06/24/2016   HGB 10.5* 06/24/2016   HCT 30.9* 06/24/2016   MCV 90.3 06/24/2016   PLT 79* 06/24/2016    Recent Labs Lab 06/22/16 0254  06/24/16 0631  NA 142  < > 143  K 3.2*  < > 3.3*  CL 112*  < > 113*  CO2 22  < > 25  BUN 34*  < > 17  CREATININE 1.36*  < > 1.05*  CALCIUM 8.2*  < > 8.2*  PROT 5.6*  --   --   BILITOT 0.3  --   --   ALKPHOS 120  --   --   ALT 12*  --   --   AST 15  --   --   GLUCOSE 119*  < > 121*  < > = values in this interval not displayed. Lab Results  Component Value Date   CHOL 150 07/13/2014   HDL 58.20 07/13/2014   LDLCALC 69 07/13/2014   TRIG 114.0 07/13/2014   No results found for: [facility]   Radiology Studies    Mr Lumbar Spine Wo Contrast  06/02/2016  CLINICAL DATA:  Low back pain. No reported radicular complaints. History of breast cancer. EXAM: MRI LUMBAR SPINE WITHOUT CONTRAST TECHNIQUE: Multiplanar, multisequence MR imaging of the lumbar spine was performed. No intravenous contrast was administered. COMPARISON:  CT abdomen and pelvis 06/01/2016 FINDINGS: The examination had to  be discontinued prior to completion due to patient claustrophobia. Sagittal T1, T2, and STIR sequences were obtained. No axial imaging was performed. Segmentation:  Standard. Alignment: Mild right convex curvature of the lumbar spine. No significant listhesis. Vertebrae: Preserved vertebral body heights without evidence of fracture. Hemangiomas in the L5 and S2 vertebrae. No destructive osseous lesions identified. Conus medullaris: Extends to the superior L1 level and appears normal. Paraspinal and other soft tissues: Prominent subcutaneous edema in the low back. Disc levels: Detailed  assessment of degenerative changes limited by the lack of axial imaging. There is relatively diffuse lumbar disc desiccation. There is mild disc space narrowing at L4-5 and L5-S1 with preserved disc space heights elsewhere. There is no evidence of significant spinal stenosis. The thecal sac is effaced at the L5-S1 level by prominent epidural fat. There is moderate facet arthrosis in the lower lumbar spine. At L5-S1, asymmetric right-sided disc space height loss, disc bulging, endplate spurring, and facet arthrosis result in severe right and moderate left neural foraminal stenosis. At L4-5, disc bulging and facet arthrosis result in mild left neural foraminal stenosis. IMPRESSION: 1. Incomplete examination as above.  No spinal stenosis. 2. Lower lumbar disc and facet degeneration, most notable at L5-S1 where there is severe right and moderate left neural foraminal stenosis. These results were called by telephone at the time of interpretation on 06/02/2016 at 12:30 pm to Dr. Loletha Grayer , who verbally acknowledged these results. Electronically Signed   By: Logan Bores M.D.   On: 06/02/2016 14:54    EKG & Cardiac Imaging    EKG: NSR, HR 66, no acute ST or T-wave changes.  Echocardiogram: 05/20/2016 Study Conclusions  - Left ventricle: The cavity size was normal. There was mild  concentric hypertrophy. Systolic function was normal. The  estimated ejection fraction was in the range of 60% to 65%. Wall  motion was normal; there were no regional wall motion  abnormalities. - Aortic valve: There was mild regurgitation. - Left atrium: The atrium was mildly dilated. - Right ventricle: Systolic function was normal. - Pericardium, extracardiac: A trivial pericardial effusion was  identified.  Impressions: - Normal study.  Assessment & Plan    1. Hypertensive Emergency - admitted with generalized body aches, nausea, and vomiting, occuring 1 week after her chemotherapy session. Unable to keep  down medications on 7/14, but this has since resolved. Also reports significant improvement in her pain.  - on admission, BP was elevated to 196/75, thought to be secondary to her severe pain. She was continued on Clonidine (0.1mg  TID), Cardura (4mg  daily --> increased to 8mg  daily at time of admission), Coreg (12.5mg  BID), Hydralazine (100mg  TID), and Lisinopril (10 mg daily). Clonidine was increased to 0.2mg  TID on 06/23/2016. This AM, her BP was significantly elevated to 218/81. She was therefore transferred to the ICU and started on IV Nitroprusside. SBP has since been in the 150's - 170's. Denies any current pain. - would wean IV Nitroprusside as able. Could consider increasing Lisinopril to 20 mg daily or the addition of Amlodipine. Would not increase BB, as HR is currently in the mid-50's. She reports her BP had been well-controlled at home PTA.  - Aldosterone and Renin being checked by admitting team. Consider renal US if BP remains elevated.  2. Elevated Troponin - cyclic values have been 0.06, 0.05, and < 0.03.  EKG without acute ischemic changes. - flat-trend likely secondary to hypertensive urgency.  - denies any recent anginal symptoms. No further ischemic evaluation indicated  at this time.  3. Stage 3 CKD - creatinine 1.36 on admission, 1.05 today.  4. Chemotherapy Associated Nausea, Vomiting, and Generalized Pain - currently improved. - per admitting team  5. History of HOCM/ Chronic Diastolic CHF - recent echo in 05/2016 showing EF of 60-65% with mild concentric hypertrophy. (Cancelled echo ordered today, as she just had one last month). - does not appear volume overloaded on physical exam. - followed by Dr. Rockey Situ.  Signed, Erma Heritage, PA-C 06/24/2016, 11:36 AM Pager: [phone]

## 2016-06-24 NOTE — Care Management Note (Signed)
Case Management Note  Patient Details  Name: Cassidy Ortiz MRN: [id number] Date of Birth: 30-Oct-1941  Subjective/Objective:      Transferring to ICU 14 per SBP=216 and Nipride gtt initiated.               Action/Plan:   Expected Discharge Date:                  Expected Discharge Plan:     In-House Referral:     Discharge planning Services     Post Acute Care Choice:    Choice offered to:     DME Arranged:    DME Agency:     HH Arranged:    HH Agency:     Status of Service:     If discussed at [employer] of Stay Meetings, dates discussed:    Additional Comments:  Cassidy Ortiz A, RN 06/24/2016, 8:42 AM

## 2016-06-25 DIAGNOSIS — R52 Pain, unspecified: Secondary | ICD-10-CM

## 2016-06-25 DIAGNOSIS — I421 Obstructive hypertrophic cardiomyopathy: Secondary | ICD-10-CM | POA: Insufficient documentation

## 2016-06-25 DIAGNOSIS — I671 Cerebral aneurysm, nonruptured: Secondary | ICD-10-CM

## 2016-06-25 DIAGNOSIS — C50919 Malignant neoplasm of unspecified site of unspecified female breast: Secondary | ICD-10-CM | POA: Insufficient documentation

## 2016-06-25 LAB — BASIC METABOLIC PANEL
Anion gap: 1 — ABNORMAL LOW (ref 5–15)
BUN: 18 mg/dL (ref 6–20)
CO2: 28 mmol/L (ref 22–32)
Calcium: 8.3 mg/dL — ABNORMAL LOW (ref 8.9–10.3)
Chloride: 113 mmol/L — ABNORMAL HIGH (ref 101–111)
Creatinine, Ser: 1.15 mg/dL — ABNORMAL HIGH (ref 0.44–1.00)
GFR calc Af Amer: 53 mL/min — ABNORMAL LOW (ref >60)
GFR calc non Af Amer: 46 mL/min — ABNORMAL LOW (ref >60)
Glucose, Bld: 132 mg/dL — ABNORMAL HIGH (ref 65–99)
Potassium: 4 mmol/L (ref 3.5–5.1)
Sodium: 142 mmol/L (ref 135–145)

## 2016-06-25 LAB — GLUCOSE, CAPILLARY: Glucose-Capillary: 110 mg/dL — ABNORMAL HIGH (ref 65–99)

## 2016-06-25 LAB — MAGNESIUM: Magnesium: 2.1 mg/dL (ref 1.7–2.4)

## 2016-06-25 NOTE — Progress Notes (Signed)
Pt continues to require nipride drip to control blood pressure. Attempted titration throughout the day and pt's blood pressure would increase shortly after titrating the drip down. Will continue to attempt progress with titration and will continue to monitor.

## 2016-06-25 NOTE — Progress Notes (Signed)
[facility] [place] NAME: Cassidy Ortiz    MR#:  [id number]  DATE OF BIRTH:  May 26, 1941  SUBJECTIVE:  CHIEF COMPLAINT:   Chief Complaint  Patient presents with  . Back Pain  . Arm Pain  BP remains difficult to control, on nitroprusside drip REVIEW OF SYSTEMS:  Review of Systems  Constitutional: Negative for fever, weight loss, malaise/fatigue and diaphoresis.  HENT: Negative for ear discharge, ear pain, hearing loss, nosebleeds, sore throat and tinnitus.   Eyes: Negative for blurred vision and pain.  Respiratory: Negative for cough, hemoptysis, shortness of breath and wheezing.   Cardiovascular: Negative for chest pain, palpitations, orthopnea and leg swelling.  Gastrointestinal: Negative for heartburn, vomiting, abdominal pain, diarrhea, constipation and blood in stool.  Genitourinary: Negative for dysuria, urgency and frequency.  Musculoskeletal: Negative for myalgias and back pain.  Skin: Negative for itching and rash.  Neurological: Negative for dizziness, tingling, tremors, focal weakness, seizures, weakness and headaches.  Psychiatric/Behavioral: Negative for depression. The patient is not nervous/anxious.    DRUG ALLERGIES:   Allergies  Allergen Reactions  . Amlodipine     Leg swelling   VITALS:  Blood pressure 187/72, pulse 62, temperature 98.7 F (37.1 C), temperature source Oral, resp. rate 19, height 5\' 7"  (1.702 m), weight 94.3 kg (207 lb 14.3 oz), SpO2 97 %. PHYSICAL EXAMINATION:  Physical Exam  Constitutional: She is oriented to person, place, and time and well-developed, well-nourished, and in no distress.  HENT:  Head: Normocephalic and atraumatic.  Eyes: Conjunctivae and EOM are normal. Pupils are equal, round, and reactive to light.  Neck: Normal range of motion. Neck supple. No tracheal deviation present. No thyromegaly present.  Cardiovascular: Normal rate, regular rhythm and normal heart sounds.     Pulmonary/Chest: Effort normal and breath sounds normal. No respiratory distress. She has no wheezes. She exhibits no tenderness.  Abdominal: Soft. Bowel sounds are normal. She exhibits no distension. There is no tenderness.  Musculoskeletal: Normal range of motion.  Neurological: She is alert and oriented to person, place, and time. No cranial nerve deficit.  Skin: Skin is warm and dry. No rash noted.  Psychiatric: Mood and affect normal.   LABORATORY PANEL:   CBC  Recent Labs Lab 06/24/16 0631  WBC 18.1*  HGB 10.5*  HCT 30.9*  PLT 79*   ------------------------------------------------------------------------------------------------------------------ Chemistries   Recent Labs Lab 06/22/16 0254  06/25/16 0514  NA 142  < > 142  K 3.2*  < > 4.0  CL 112*  < > 113*  CO2 22  < > 28  GLUCOSE 119*  < > 132*  BUN 34*  < > 18  CREATININE 1.36*  < > 1.15*  CALCIUM 8.2*  < > 8.3*  MG  --   < > 2.1  AST 15  --   --   ALT 12*  --   --   ALKPHOS 120  --   --   BILITOT 0.3  --   --   < > = values in this interval not displayed.   Impression and plan: 75 y.o. female with a known history of breast cancer currently undergoing chemotherapy at [facility] Being admitted for  * Uncontrolled hypertension: Likely secondary to severe pain on presentation.  - Continue Coreg, clonidine, Cardura, hydralazine and lisinopril, hydralazine 10 mg IV every 6 hour as needed for better blood pressure control - Nitroprusside drip  * Hypokalemia - Replete and resolved  *  Acute on Chronic kidney disease stage III - Likely due to dehydration, prerenal etiology - She did have some diarrhea, nausea and vomiting 2-3 days before, likely from her chemotherapy - Improving with IV hydration  * Elevated troponin: due to supply demand ischemia secondary to the accelerated hypertension. No further cardiac workup done in the hospital.  * History of breast cancer follow-up with oncology as outpatient [name]' disease. Continue levothyroxine * History of sleep apnea * Depression. Continued psychiatric medications * Thrombocytopenia likely from recent chemotherapy    All the records are reviewed and case discussed with Care Management/Social Worker. Management plans discussed with the patient, family (husband at bedside) and they are in agreement.  CODE STATUS: DO NOT RESUSCITATE  TOTAL TIME TAKING CARE OF THIS PATIENT: 35 minutes.   More than 50% of the time was spent in counseling/coordination of care: YES (d/w husband at bedside), also d/w Dr Rockey Situ at bedside.  POSSIBLE D/C IN 1-2 DAYS, DEPENDING ON CLINICAL CONDITION. And BP control   Zylen Wenig M.D on 06/25/2016 at 12:40 PM  Between 7am to 6pm - Pager - [phone]  After 6pm go to [email or web address] - Proofreader  [facility]  Office  [phone]  CC: Primary care physician; Rica Mast, MD  Note: This dictation was prepared with Dragon dictation along with smaller phrase technology. Any transcriptional errors that result from this process are unintentional.

## 2016-06-25 NOTE — Progress Notes (Signed)
Chaplain rounded the unit to provide a compassionate presence and support to the 75 year old female patient. Her disposition seemed very pleasant. She smiled, laughed and was talkative. Cassidy Ortiz [phone]

## 2016-06-25 NOTE — Progress Notes (Signed)
Patient: Cassidy Ortiz / Admit Date: 06/22/2016 / Date of Encounter: 06/25/2016, 6:14 PM   Subjective: Patient reports pain has resolved, Still with severely elevated the pressure despite nitroglycerin infusion, numerous oral medications Chronic cough, likely secondary to chronic bronchitis (long history of smoking) Long discussion concerning recent events Presented for second time with severe pain, acute onset 7 days after recent chemotherapy treatment Initially presented Friday, July 14, discharged home from emergency room, came back Saturday, July 15 Excruciating pain back, arms/shoulders Systolic pressures more than 200  Review of Systems: Review of Systems  Constitutional: Negative.   Respiratory:       Chronic cough  Cardiovascular: Negative.   Gastrointestinal: Negative.   Musculoskeletal: Negative.   Neurological: Negative.   Psychiatric/Behavioral: Negative.   All other systems reviewed and are negative.   Objective: Telemetry: Normal sinus rhythm Physical Exam: Blood pressure 155/79, pulse 60, temperature 98.7 F (37.1 C), temperature source Oral, resp. rate 21, height 5\' 7"  (1.702 m), weight 207 lb 14.3 oz (94.3 kg), SpO2 94 %. Body mass index is 32.55 kg/(m^2). General: Well developed, well nourished, in no acute distress. Head: Normocephalic, atraumatic, sclera non-icteric, no xanthomas, nares are without discharge. Neck: Negative for carotid bruits. JVP not elevated. Lungs: Clear bilaterally to auscultation without wheezes, rales, or rhonchi. Breathing is unlabored. Heart: RRR S1 S2 with 2+murmur, no rubs, or gallops.  Abdomen: Soft, non-tender, non-distended with normoactive bowel sounds. No rebound/guarding. Extremities: No clubbing or cyanosis. No edema. Distal pedal pulses are 2+ and equal bilaterally. Neuro: Alert and oriented X 3. Moves all extremities spontaneously. Psych:  Responds to questions appropriately with a normal affect.   Intake/Output  Summary (Last 24 hours) at 06/25/16 1814 Last data filed at 06/25/16 1800  Gross per 24 hour  Intake 427.98 ml  Output      1 ml  Net 426.98 ml    Inpatient Medications:  . aspirin EC  162 mg Oral Daily  . atorvastatin  40 mg Oral Daily  . buPROPion  100 mg Oral BID  . carvedilol  12.5 mg Oral BID WC  . cloNIDine  0.2 mg Oral TID  . dexamethasone  4 mg Oral Daily  . docusate sodium  100 mg Oral BID  . doxazosin  8 mg Oral Daily  . enoxaparin (LOVENOX) injection  40 mg Subcutaneous Q24H  . fesoterodine  4 mg Oral Daily  . hydrALAZINE  100 mg Oral TID  . levothyroxine  175 mcg Oral QAC breakfast  . lidocaine  1 patch Transdermal Q24H  . lidocaine-prilocaine   Topical BID  . lisinopril  20 mg Oral Daily  . mirtazapine  45 mg Oral QHS  . multivitamin with minerals  1 tablet Oral Daily  . pantoprazole  40 mg Oral Daily  . rOPINIRole  1 mg Oral TID  . traZODone  100 mg Oral QHS   Infusions:  . nitroPRUSSide 0.499 mcg/kg/min (06/25/16 1800)    Labs:  Recent Labs  06/24/16 0631 06/25/16 0514  NA 143 142  K 3.3* 4.0  CL 113* 113*  CO2 25 28  GLUCOSE 121* 132*  BUN 17 18  CREATININE 1.05* 1.15*  CALCIUM 8.2* 8.3*  MG 1.3* 2.1   No results for input(s): AST, ALT, ALKPHOS, BILITOT, PROT, ALBUMIN in the last 72 hours.  Recent Labs  06/23/16 0500 06/24/16 0631  WBC 12.4* 18.1*  HGB 10.6* 10.5*  HCT 31.1* 30.9*  MCV 90.7 90.3  PLT 90* 79*   No  results for input(s): CKTOTAL, CKMB, TROPONINI in the last 72 hours. Invalid input(s): POCBNP No results for input(s): HGBA1C in the last 72 hours.   Weights: Filed Weights   06/24/16 0500 06/24/16 0900 06/25/16 0500  Weight: 210 lb 9.6 oz (95.528 kg) 210 lb 8.6 oz (95.5 kg) 207 lb 14.3 oz (94.3 kg)     Radiology/Studies:  Mr Lumbar Spine Wo Contrast  06/02/2016  IMPRESSION: 1. Incomplete examination as above.  No spinal stenosis. 2. Lower lumbar disc and facet degeneration, most notable at L5-S1 where there is severe  right and moderate left neural foraminal stenosis. These results were called by telephone at the time of interpretation on 06/02/2016 at 12:30 pm to Dr. Loletha Grayer , who verbally acknowledged these results. Electronically Signed   By: Logan Bores M.D.   On: 06/02/2016 14:54   Ct Renal Stone Study  06/01/2016  CLINICAL DATA:IMPRESSION: 1. No acute abnormality. No urolithiasis. No evidence of urinary tract obstruction. No evidence of bowel obstruction or acute bowel inflammation. Normal appendix. 2. Marked sigmoid diverticulosis with chronic sigmoid wall thickening and no pericolonic fat stranding, most consistent with muscular hypertrophy from chronic diverticulosis. 3. Additional findings include aortic atherosclerosis, coronary atherosclerosis, cholelithiasis, left adrenal adenoma and uterine fibroid. Electronically Signed   By: Ilona Sorrel M.D.   On: 06/01/2016 07:03     Assessment and Plan  75 y.o. 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 She initially presented with lower extremity edema. Previous elevated creatinine of 1.5.  Undergoing treatment for breast cancer, has completed chemotherapy cycle #2 7 days following each cycle has developed severe pain, difficult to control upper tension  1) accelerated hypertension Etiology not clear, most recent episode with persistent hypertension despite nitroglycerin infusion, and oral medications including clonidine, Cardura, hydralazine, Coreg, lisinopril  -Notes indicating she does have a cerebral shunt that may not be working.  Previously evaluated at [facility]. Unclear if this may be contributing to difficult to control hypertension  Typically does not have problem with her blood pressure though did have one previous hospitalization one year ago for severe hypertension not associated with pain, improved with IV hydralazine and her regular medications -We did talk with the patient in the morning, Blood pressure seems to be trending down course of the day Will continue current regimen for now and wean nitroglycerin as blood pressure tolerates Could probably transfer to to a once off nitroglycerin infusion  2) uncontrolled pain Had similar pain control issues one week following first cycle chemotherapy as well as after second cycle Etiology unclear, cardiac enzymes negative EKG normal Less likely cardiac She reports symptoms like a "water faucet coming on and off" Unable to exclude spasm. No electrolyte issues that should contribute to myalgias/muscle cramping Unable to exclude side effect from her chemotherapy  3) LVOT obstruction Previously noted on echocardiogram, murmur consistent with cardiac findings  4)  breast cancer She has completed cycle 2 of her chemotherapy Both cycles with nausea, vomiting, generalized pain 7 days following the treatment    Total encounter time more than 35 minutes  Greater than 50% was spent in counseling and coordination  of care with the patient    Signed, Esmond Plants, MD, Ph.D. [facility] 06/25/2016, 6:14 PM

## 2016-06-26 DIAGNOSIS — Z853 Personal history of malignant neoplasm of breast: Secondary | ICD-10-CM

## 2016-06-26 LAB — CBC
HCT: 29.5 % — ABNORMAL LOW (ref 35.0–47.0)
Hemoglobin: 10 g/dL — ABNORMAL LOW (ref 12.0–16.0)
MCH: 30.5 pg (ref 26.0–34.0)
MCHC: 34 g/dL (ref 32.0–36.0)
MCV: 89.7 fL (ref 80.0–100.0)
Platelets: 84 10*3/uL — ABNORMAL LOW (ref 150–440)
RBC: 3.29 MIL/uL — AB (ref 3.80–5.20)
RDW: 16.6 % — ABNORMAL HIGH (ref 11.5–14.5)
WBC: 20.8 10*3/uL — AB (ref 3.6–11.0)

## 2016-06-26 LAB — MRSA PCR SCREENING: MRSA by PCR: NEGATIVE

## 2016-06-26 LAB — GLUCOSE, CAPILLARY: GLUCOSE-CAPILLARY: 100 mg/dL — AB (ref 65–99)

## 2016-06-26 MED ORDER — ISOSORBIDE MONONITRATE ER 30 MG PO TB24
30.0000 mg | ORAL_TABLET | Freq: Every day | ORAL | Status: DC
  Administered 2016-06-26 – 2016-06-27 (×2): 30 mg via ORAL
  Filled 2016-06-26 (×2): qty 1

## 2016-06-26 MED ORDER — HYDROCHLOROTHIAZIDE 25 MG PO TABS
25.0000 mg | ORAL_TABLET | Freq: Every day | ORAL | Status: DC
  Administered 2016-06-26 – 2016-06-30 (×5): 25 mg via ORAL
  Filled 2016-06-26 (×5): qty 1

## 2016-06-26 NOTE — Progress Notes (Signed)
[facility] [place] NAME: Cassidy Ortiz    MR#:  [id number]  DATE OF BIRTH:  02-15-1941  SUBJECTIVE:  CHIEF COMPLAINT:   Chief Complaint  Patient presents with  . Back Pain  . Arm Pain  BP still high, remains on nitroprusside drip, husband at bedside. REVIEW OF SYSTEMS:  Review of Systems  Constitutional: Negative for fever, weight loss, malaise/fatigue and diaphoresis.  HENT: Negative for ear discharge, ear pain, hearing loss, nosebleeds, sore throat and tinnitus.   Eyes: Negative for blurred vision and pain.  Respiratory: Negative for cough, hemoptysis, shortness of breath and wheezing.   Cardiovascular: Negative for chest pain, palpitations, orthopnea and leg swelling.  Gastrointestinal: Negative for heartburn, vomiting, abdominal pain, diarrhea, constipation and blood in stool.  Genitourinary: Negative for dysuria, urgency and frequency.  Musculoskeletal: Negative for myalgias and back pain.  Skin: Negative for itching and rash.  Neurological: Negative for dizziness, tingling, tremors, focal weakness, seizures, weakness and headaches.  Psychiatric/Behavioral: Negative for depression. The patient is not nervous/anxious.    DRUG ALLERGIES:   Allergies  Allergen Reactions  . Amlodipine     Leg swelling   VITALS:  Blood pressure 152/72, pulse 60, temperature 98.5 F (36.9 C), temperature source Oral, resp. rate 15, height 5\' 7"  (1.702 m), weight 94.3 kg (207 lb 14.3 oz), SpO2 93 %. PHYSICAL EXAMINATION:  Physical Exam  Constitutional: She is oriented to person, place, and time and well-developed, well-nourished, and in no distress.  HENT:  Head: Normocephalic and atraumatic.  Eyes: Conjunctivae and EOM are normal. Pupils are equal, round, and reactive to light.  Neck: Normal range of motion. Neck supple. No tracheal deviation present. No thyromegaly present.  Cardiovascular: Normal rate, regular rhythm and normal heart sounds.     Pulmonary/Chest: Effort normal and breath sounds normal. No respiratory distress. She has no wheezes. She exhibits no tenderness.  Abdominal: Soft. Bowel sounds are normal. She exhibits no distension. There is no tenderness.  Musculoskeletal: Normal range of motion.  Neurological: She is alert and oriented to person, place, and time. No cranial nerve deficit.  Skin: Skin is warm and dry. No rash noted.  Psychiatric: Mood and affect normal.   LABORATORY PANEL:   CBC  Recent Labs Lab 06/26/16 0500  WBC 20.8*  HGB 10.0*  HCT 29.5*  PLT 84*   ------------------------------------------------------------------------------------------------------------------ Chemistries   Recent Labs Lab 06/22/16 0254  06/25/16 0514  NA 142  < > 142  K 3.2*  < > 4.0  CL 112*  < > 113*  CO2 22  < > 28  GLUCOSE 119*  < > 132*  BUN 34*  < > 18  CREATININE 1.36*  < > 1.15*  CALCIUM 8.2*  < > 8.3*  MG  --   < > 2.1  AST 15  --   --   ALT 12*  --   --   ALKPHOS 120  --   --   BILITOT 0.3  --   --   < > = values in this interval not displayed.   Impression and plan: 75 y.o. female with a known history of breast cancer currently undergoing chemotherapy at [facility] Being admitted for  * Uncontrolled hypertension: Likely secondary to severe pain on presentation.  - Continue Coreg, clonidine, Cardura, hydralazine and lisinopril, hydralazine 10 mg IV every 6 hour as needed for better blood pressure control - will add HCTZ and imdur and try to wean  Nitroprusside drip  * Hypokalemia - Replete and resolved  * Acute on Chronic kidney disease stage III - Likely due to dehydration, prerenal etiology - She did have some diarrhea, nausea and vomiting 2-3 days before, likely from her chemotherapy - Improving with IV hydration  * Elevated troponin: due to supply demand ischemia secondary to the accelerated hypertension. No further cardiac workup done in the hospital.  * History of breast cancer  follow-up with oncology as outpatient [name]' disease. Continue levothyroxine * History of sleep apnea * Depression. Continued psychiatric medications * Thrombocytopenia likely from recent chemotherapy   Can transfer her to the floor if able to wean off nitroprusside drip  All the records are reviewed and case discussed with Care Management/Social Worker. Management plans discussed with the patient, family (husband at bedside) and they are in agreement.  CODE STATUS: DO NOT RESUSCITATE  TOTAL TIME TAKING CARE OF THIS PATIENT: 35 minutes.   More than 50% of the time was spent in counseling/coordination of care: YES (d/w husband at bedside)  POSSIBLE D/C IN 1-2 DAYS, DEPENDING ON CLINICAL CONDITION. And BP control   Daniyal Tabor M.D on 06/26/2016 at 3:56 PM  Between 7am to 6pm - Pager - [phone]  After 6pm go to [email or web address] - Proofreader  [facility] [place]  Office  [phone]  CC: Primary care physician; Rica Mast, MD  Note: This dictation was prepared with Dragon dictation along with smaller phrase technology. Any transcriptional errors that result from this process are unintentional.

## 2016-06-27 LAB — GLUCOSE, CAPILLARY: GLUCOSE-CAPILLARY: 92 mg/dL (ref 65–99)

## 2016-06-27 MED ORDER — DOXAZOSIN MESYLATE 4 MG PO TABS
8.0000 mg | ORAL_TABLET | Freq: Two times a day (BID) | ORAL | Status: DC
  Administered 2016-06-27 – 2016-06-30 (×5): 8 mg via ORAL
  Filled 2016-06-27 (×6): qty 2

## 2016-06-27 MED ORDER — LISINOPRIL 20 MG PO TABS
40.0000 mg | ORAL_TABLET | Freq: Every day | ORAL | Status: DC
  Administered 2016-06-28 – 2016-06-30 (×3): 40 mg via ORAL
  Filled 2016-06-27 (×3): qty 2

## 2016-06-27 MED ORDER — LISINOPRIL 20 MG PO TABS
20.0000 mg | ORAL_TABLET | Freq: Once | ORAL | Status: AC
  Administered 2016-06-27: 20 mg via ORAL
  Filled 2016-06-27: qty 1

## 2016-06-27 MED ORDER — ISOSORBIDE MONONITRATE ER 30 MG PO TB24: 30.0000 mg | ORAL_TABLET | Freq: Two times a day (BID) | ORAL | Status: DC

## 2016-06-27 MED ORDER — ISOSORBIDE MONONITRATE ER 60 MG PO TB24
60.0000 mg | ORAL_TABLET | Freq: Two times a day (BID) | ORAL | Status: DC
  Administered 2016-06-27 – 2016-06-30 (×6): 60 mg via ORAL
  Filled 2016-06-27: qty 2
  Filled 2016-06-27 (×4): qty 1
  Filled 2016-06-27: qty 2

## 2016-06-27 NOTE — Progress Notes (Signed)
Patient: Cassidy Ortiz / Admit Date: 06/22/2016 / Date of Encounter: 06/27/2016, 5:01 PM   Subjective: No pain for the past 4 days Still with severely elevated the pressure despite nitroglycerin infusion (turned off this evening), numerous oral medications Chronic cough, likely secondary to chronic bronchitis (long history of smoking) Overall in good spirits  Review of Systems: Review of Systems  Constitutional: Negative.   Respiratory:       Chronic cough  Cardiovascular: Negative.   Gastrointestinal: Negative.   Musculoskeletal: Negative.   Neurological: Negative.   Psychiatric/Behavioral: Negative.   All other systems reviewed and are negative.   Objective: Telemetry: Normal sinus rhythm Physical Exam: Blood pressure 160/75, pulse 66, temperature 98.1 F (36.7 C), temperature source Axillary, resp. rate 18, height 5\' 7"  (1.702 m), weight 212 lb 4.9 oz (96.3 kg), SpO2 96 %. Body mass index is 33.24 kg/(m^2). General: Well developed, well nourished, in no acute distress. Head: Normocephalic, atraumatic, sclera non-icteric, no xanthomas, nares are without discharge. Neck: Negative for carotid bruits. JVP not elevated. Lungs: Clear bilaterally to auscultation without wheezes, rales, or rhonchi. Breathing is unlabored. Heart: RRR S1 S2 with 2+murmur, no rubs, or gallops.  Abdomen: Soft, non-tender, non-distended with normoactive bowel sounds. No rebound/guarding. Extremities: No clubbing or cyanosis. No edema. Distal pedal pulses are 2+ and equal bilaterally. Neuro: Alert and oriented X 3. Moves all extremities spontaneously. Psych:  Responds to questions appropriately with a normal affect.   Intake/Output Summary (Last 24 hours) at 06/27/16 1701 Last data filed at 06/27/16 1140  Gross per 24 hour  Intake 362.51 ml  Output      0 ml  Net 362.51 ml    Inpatient Medications:  . aspirin EC  162 mg Oral Daily  . atorvastatin  40 mg Oral Daily  . buPROPion  100 mg Oral BID   . carvedilol  12.5 mg Oral BID WC  . cloNIDine  0.2 mg Oral TID  . dexamethasone  4 mg Oral Daily  . docusate sodium  100 mg Oral BID  . doxazosin  8 mg Oral BID  . enoxaparin (LOVENOX) injection  40 mg Subcutaneous Q24H  . fesoterodine  4 mg Oral Daily  . hydrALAZINE  100 mg Oral TID  . hydrochlorothiazide  25 mg Oral Daily  . isosorbide mononitrate  30 mg Oral BID  . levothyroxine  175 mcg Oral QAC breakfast  . lidocaine  1 patch Transdermal Q24H  . lidocaine-prilocaine   Topical BID  . [START ON 06/28/2016] lisinopril  40 mg Oral Daily  . mirtazapine  45 mg Oral QHS  . multivitamin with minerals  1 tablet Oral Daily  . pantoprazole  40 mg Oral Daily  . rOPINIRole  1 mg Oral TID  . traZODone  100 mg Oral QHS   Infusions:  . nitroPRUSSide Stopped (06/27/16 1559)    Labs:  Recent Labs  06/25/16 0514  NA 142  K 4.0  CL 113*  CO2 28  GLUCOSE 132*  BUN 18  CREATININE 1.15*  CALCIUM 8.3*  MG 2.1   No results for input(s): AST, ALT, ALKPHOS, BILITOT, PROT, ALBUMIN in the last 72 hours.  Recent Labs  06/26/16 0500  WBC 20.8*  HGB 10.0*  HCT 29.5*  MCV 89.7  PLT 84*   No results for input(s): CKTOTAL, CKMB, TROPONINI in the last 72 hours. Invalid input(s): POCBNP No results for input(s): HGBA1C in the last 72 hours.   Weights: Filed Weights   06/24/16 0900  06/25/16 0500 06/27/16 0500  Weight: 210 lb 8.6 oz (95.5 kg) 207 lb 14.3 oz (94.3 kg) 212 lb 4.9 oz (96.3 kg)     Radiology/Studies:  Mr Lumbar Spine Wo Contrast  06/02/2016  IMPRESSION: 1. Incomplete examination as above.  No spinal stenosis. 2. Lower lumbar disc and facet degeneration, most notable at L5-S1 where there is severe right and moderate left neural foraminal stenosis. These results were called by telephone at the time of interpretation on 06/02/2016 at 12:30 pm to Dr. Loletha Grayer , who verbally acknowledged these results. Electronically Signed   By: Logan Bores M.D.   On: 06/02/2016 14:54    Ct Renal Stone Study  06/01/2016  CLINICAL DATA:IMPRESSION: 1. No acute abnormality. No urolithiasis. No evidence of urinary tract obstruction. No evidence of bowel obstruction or acute bowel inflammation. Normal appendix. 2. Marked sigmoid diverticulosis with chronic sigmoid wall thickening and no pericolonic fat stranding, most consistent with muscular hypertrophy from chronic diverticulosis. 3. Additional findings include aortic atherosclerosis, coronary atherosclerosis, cholelithiasis, left adrenal adenoma and uterine fibroid. Electronically Signed   By: Ilona Sorrel M.D.   On: 06/01/2016 07:03     Assessment and Plan  75 y.o. 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 She initially presented with lower extremity edema. Previous elevated creatinine of 1.5.  Undergoing treatment for breast cancer, has completed chemotherapy cycle #2 7 days following each cycle has developed severe pain, difficult to control upper tension  1) accelerated hypertension Etiology not clear,    does have a h/o cerebral shunt  Unable to tolerate MRI several weeks ago  Previously evaluated at [facility]. Unclear if this may be contributing to difficult to control hypertension   one previous hospitalization one year ago for severe hypertension not associated with pain, improved with IV hydralazine and her regular medications  Now off nitro infusion, Pressures still elevated, --will increase isosorbide up to 60 mg BID cardura 8 BID  2) uncontrolled pain Resolved, Etiology unclear Unable to  tolerate MRI head (if needed, she would need sedation)  3) LVOT obstruction Previously noted on echocardiogram, murmur consistent with cardiac findings  4)  breast cancer She has completed cycle 2 of her chemotherapy Both cycles with nausea, vomiting, generalized pain 7 days following the treatment    Total encounter time more than 35 minutes  Greater than 50% was spent in counseling and coordination of care with the patient    Signed, Esmond Plants, MD, Ph.D. [facility] 06/27/2016, 5:01 PM

## 2016-06-27 NOTE — Care Management (Signed)
ICU for HTN crisis. Nitroprusside drip. No RNCM needs per RN. Husband.

## 2016-06-27 NOTE — Progress Notes (Signed)
[place] at [place] NAME: Cassidy Ortiz    MR#:  [id number]  DATE OF BIRTH:  Dec 14, 1940  SUBJECTIVE:  CHIEF COMPLAINT:   Chief Complaint  Patient presents with  . Back Pain  . Arm Pain  BP still high, remains on nitroprusside drip,couldnt wean off , husband at bedside. REVIEW OF SYSTEMS:  Review of Systems  Constitutional: Negative for fever, weight loss, malaise/fatigue and diaphoresis.  HENT: Negative for ear discharge, ear pain, hearing loss, nosebleeds, sore throat and tinnitus.   Eyes: Negative for blurred vision and pain.  Respiratory: Negative for cough, hemoptysis, shortness of breath and wheezing.   Cardiovascular: Negative for chest pain, palpitations, orthopnea and leg swelling.  Gastrointestinal: Negative for heartburn, vomiting, abdominal pain, diarrhea, constipation and blood in stool.  Genitourinary: Negative for dysuria, urgency and frequency.  Musculoskeletal: Negative for myalgias and back pain.  Skin: Negative for itching and rash.  Neurological: Negative for dizziness, tingling, tremors, focal weakness, seizures, weakness and headaches.  Psychiatric/Behavioral: Negative for depression. The patient is not nervous/anxious.    DRUG ALLERGIES:   Allergies  Allergen Reactions  . Amlodipine     Leg swelling   VITALS:  Blood pressure 162/71, pulse 57, temperature 97.9 F (36.6 C), temperature source Axillary, resp. rate 13, height 5\' 7"  (1.702 m), weight 96.3 kg (212 lb 4.9 oz), SpO2 94 %. PHYSICAL EXAMINATION:  Physical Exam  Constitutional: She is oriented to person, place, and time and well-developed, well-nourished, and in no distress.  HENT:  Head: Normocephalic and atraumatic.  Eyes: Conjunctivae and EOM are normal. Pupils are equal, round, and reactive to light.  Neck: Normal range of motion. Neck supple. No tracheal deviation present. No thyromegaly present.  Cardiovascular: Normal rate, regular rhythm and  normal heart sounds.   Pulmonary/Chest: Effort normal and breath sounds normal. No respiratory distress. She has no wheezes. She exhibits no tenderness.  Abdominal: Soft. Bowel sounds are normal. She exhibits no distension. There is no tenderness.  Musculoskeletal: Normal range of motion.  Neurological: She is alert and oriented to person, place, and time. No cranial nerve deficit.  Skin: Skin is warm and dry. No rash noted.  Psychiatric: Mood and affect normal.   LABORATORY PANEL:   CBC  Recent Labs Lab 06/26/16 0500  WBC 20.8*  HGB 10.0*  HCT 29.5*  PLT 84*   ------------------------------------------------------------------------------------------------------------------ Chemistries   Recent Labs Lab 06/22/16 0254  06/25/16 0514  NA 142  < > 142  K 3.2*  < > 4.0  CL 112*  < > 113*  CO2 22  < > 28  GLUCOSE 119*  < > 132*  BUN 34*  < > 18  CREATININE 1.36*  < > 1.15*  CALCIUM 8.2*  < > 8.3*  MG  --   < > 2.1  AST 15  --   --   ALT 12*  --   --   ALKPHOS 120  --   --   BILITOT 0.3  --   --   < > = values in this interval not displayed.   Impression and plan: 75 y.o. female with a known history of breast cancer currently undergoing chemotherapy at [facility] Being admitted for  * Uncontrolled hypertension: Likely secondary to severe pain on presentation.  -Pain is well controlled today - Continue Coreg, clonidine, Cardura, hydralazine and increased  Lisinopril to 40 mg po , hydralazine 10 mg IV every 6 hour as needed for  better blood pressure control. Titrate as needed - Added HCTZ and imdur yesterday  and try to wean Nitroprusside drip -Follow up with cardiology Dr. Donivan Scull team Discussed with the patient's primary oncologist, dr.Ennevan [phone] who does not think this is an adverse effect of the chemotherapy, but he is considering to change the chemotherapy during the next cycle  * Hypokalemia - Repleted and resolved  * Acute on Chronic kidney disease  stage III - Likely due to dehydration, prerenal etiology - She did have some diarrhea, nausea and vomiting 2-3 days before, likely from her chemotherapy - Improving with IV hydration  * Elevated troponin: due to supply demand ischemia secondary to the accelerated hypertension. No further cardiac workup done in the hospital. Pt is asymptomatic  * History of breast cancer follow-up with oncology as outpatient [name]' disease. Continue levothyroxine * History of sleep apnea * Depression. Continued psychiatric medications * Thrombocytopenia likely from recent chemotherapy   Can transfer her to the floor if able to wean off nitroprusside drip  All the records are reviewed and case discussed with Care Management/Social Worker. Management plans discussed with the patient, family (husband at bedside) and they are in agreement.  CODE STATUS: DO NOT RESUSCITATE  TOTAL TIME TAKING CARE OF THIS PATIENT: 35 minutes.   More than 50% of the time was spent in counseling/coordination of care: YES (d/w husband at bedside)  POSSIBLE D/C IN 1-2 DAYS, DEPENDING ON CLINICAL CONDITION. And BP control   Aster Eckrich M.D on 06/27/2016 at 1:17 PM  Between 7am to 6pm - Pager - [phone]  After 6pm go to [email or web address] - Proofreader  [facility]  Office  [phone]  CC: Primary care physician; Rica Mast, MD  Note: This dictation was prepared with Dragon dictation along with smaller phrase technology. Any transcriptional errors that result from this process are unintentional.

## 2016-06-27 NOTE — Progress Notes (Signed)
Spoke with Dr. Rockey Situ regarding patients BP 123456 systolic. Patient up in chair per MD. Orders to recheck BP in 30 minutes. If continued to be elevated give hydralazine 10 mg PRN at that time.

## 2016-06-28 ENCOUNTER — Telehealth: Payer: Self-pay | Admitting: Internal Medicine

## 2016-06-28 LAB — BASIC METABOLIC PANEL
ANION GAP: 6 (ref 5–15)
BUN: 34 mg/dL — AB (ref 6–20)
CALCIUM: 8.8 mg/dL — AB (ref 8.9–10.3)
CO2: 29 mmol/L (ref 22–32)
CREATININE: 1.29 mg/dL — AB (ref 0.44–1.00)
Chloride: 106 mmol/L (ref 101–111)
GFR calc non Af Amer: 40 mL/min — ABNORMAL LOW (ref >60)
GFR, EST AFRICAN AMERICAN: 46 mL/min — AB (ref >60)
GLUCOSE: 99 mg/dL (ref 65–99)
POTASSIUM: 4.4 mmol/L (ref 3.5–5.1)
SODIUM: 141 mmol/L (ref 135–145)

## 2016-06-28 LAB — CBC
HCT: 30.4 % — ABNORMAL LOW (ref 35.0–47.0)
HEMOGLOBIN: 10.3 g/dL — AB (ref 12.0–16.0)
MCH: 30.6 pg (ref 26.0–34.0)
MCHC: 33.9 g/dL (ref 32.0–36.0)
MCV: 90.3 fL (ref 80.0–100.0)
Platelets: 108 10*3/uL — ABNORMAL LOW (ref 150–440)
RBC: 3.37 MIL/uL — ABNORMAL LOW (ref 3.80–5.20)
RDW: 17.2 % — AB (ref 11.5–14.5)
WBC: 16.2 10*3/uL — ABNORMAL HIGH (ref 3.6–11.0)

## 2016-06-28 NOTE — Telephone Encounter (Signed)
HFU/ Pt is possibly going to be discharged this weekend. Dx was pain from chemo/Bp high. Pt is scheduled for 07/26 @ 4:30p. Thank you!

## 2016-06-28 NOTE — Progress Notes (Signed)
[place] at [place] NAME: Cassidy Ortiz    MR#:  [id number]  DATE OF BIRTH:  1940-12-11  SUBJECTIVE:  CHIEF COMPLAINT:   Chief Complaint  Patient presents with  . Back Pain  . Arm Pain  BP Is better ,off nitroprusside drip from yesterday afternoon. Patient is feeling better REVIEW OF SYSTEMS:  Review of Systems  Constitutional: Negative for fever, weight loss, malaise/fatigue and diaphoresis.  HENT: Negative for ear discharge, ear pain, hearing loss, nosebleeds, sore throat and tinnitus.   Eyes: Negative for blurred vision and pain.  Respiratory: Negative for cough, hemoptysis, shortness of breath and wheezing.   Cardiovascular: Negative for chest pain, palpitations, orthopnea and leg swelling.  Gastrointestinal: Negative for heartburn, vomiting, abdominal pain, diarrhea, constipation and blood in stool.  Genitourinary: Negative for dysuria, urgency and frequency.  Musculoskeletal: Negative for myalgias and back pain.  Skin: Negative for itching and rash.  Neurological: Negative for dizziness, tingling, tremors, focal weakness, seizures, weakness and headaches.  Psychiatric/Behavioral: Negative for depression. The patient is not nervous/anxious.    DRUG ALLERGIES:   Allergies  Allergen Reactions  . Amlodipine     Leg swelling   VITALS:  Blood pressure 157/66, pulse 61, temperature 97.7 F (36.5 C), temperature source Oral, resp. rate 11, height 5\' 7"  (1.702 m), weight 92.7 kg (204 lb 5.9 oz), SpO2 93 %. PHYSICAL EXAMINATION:  Physical Exam  Constitutional: She is oriented to person, place, and time and well-developed, well-nourished, and in no distress.  HENT:  Head: Normocephalic and atraumatic.  Eyes: Conjunctivae and EOM are normal. Pupils are equal, round, and reactive to light.  Neck: Normal range of motion. Neck supple. No tracheal deviation present. No thyromegaly present.  Cardiovascular: Normal rate, regular rhythm and  normal heart sounds.   Pulmonary/Chest: Effort normal and breath sounds normal. No respiratory distress. She has no wheezes. She exhibits no tenderness.  Abdominal: Soft. Bowel sounds are normal. She exhibits no distension. There is no tenderness.  Musculoskeletal: Normal range of motion.  Neurological: She is alert and oriented to person, place, and time. No cranial nerve deficit.  Skin: Skin is warm and dry. No rash noted.  Psychiatric: Mood and affect normal.   LABORATORY PANEL:   CBC  Recent Labs Lab 06/28/16 0521  WBC 16.2*  HGB 10.3*  HCT 30.4*  PLT 108*   ------------------------------------------------------------------------------------------------------------------ Chemistries   Recent Labs Lab 06/22/16 0254  06/25/16 0514 06/28/16 0521  NA 142  < > 142 141  K 3.2*  < > 4.0 4.4  CL 112*  < > 113* 106  CO2 22  < > 28 29  GLUCOSE 119*  < > 132* 99  BUN 34*  < > 18 34*  CREATININE 1.36*  < > 1.15* 1.29*  CALCIUM 8.2*  < > 8.3* 8.8*  MG  --   < > 2.1  --   AST 15  --   --   --   ALT 12*  --   --   --   ALKPHOS 120  --   --   --   BILITOT 0.3  --   --   --   < > = values in this interval not displayed.   Impression and plan: 75 y.o. female with a known history of breast cancer currently undergoing chemotherapy at [facility] Being admitted for  * Uncontrolled hypertension: Likely secondary to severe pain on presentation.  -Pain is well controlled today -  Continue Coreg, clonidine, Cardura, hydralazine and increased  Lisinopril to 40 mg po , hydralazine 10 mg IV every 6 hour as needed for better blood pressure control. Titrate as needed - Added HCTZ and imdurDose increased yesterday  and weaned off Nitroprusside drip -Follow up with cardiology Dr. Donivan Scull team Discussed with the patient's primary oncologist, dr.Ennevan [phone] who does not think this is an adverse effect of the chemotherapy, but he is considering to change the chemotherapy during the  next cycle Transfer patient to telemetry  * Hypokalemia - Repleted and resolved  * Acute on Chronic kidney disease stage III - Likely due to dehydration, prerenal etiology - She did have some diarrhea, nausea and vomiting 2-3 days before, likely from her chemotherapy - Improving with IV hydration  * Elevated troponin: due to supply demand ischemia secondary to the accelerated hypertension. No further cardiac workup done in the hospital. Pt is asymptomatic  * History of breast cancer follow-up with oncology as outpatient [name]' disease. Continue levothyroxine * History of sleep apnea * Depression. Continued psychiatric medications * Thrombocytopenia likely from recent chemotherapy  PT consult pending     All the records are reviewed and case discussed with Care Management/Social Worker. Management plans discussed with the patient, family (husband at bedside) and they are in agreement.  CODE STATUS: DO NOT RESUSCITATE  TOTAL TIME TAKING CARE OF THIS PATIENT: 35 minutes.   More than 50% of the time was spent in counseling/coordination of care: YES (d/w husband at bedside)  POSSIBLE D/C IN 1-2 DAYS, DEPENDING ON CLINICAL CONDITION. And BP control   Nicholes Mango M.D on 06/28/2016 at 3:28 PM  Between 7am to 6pm - Pager - [phone]  After 6pm go to [email or web address] - Proofreader  [facility]  Office  [phone]  CC: Primary care physician; Rica Mast, MD  Note: This dictation was prepared with Dragon dictation along with smaller phrase technology. Any transcriptional errors that result from this process are unintentional.

## 2016-06-28 NOTE — Telephone Encounter (Signed)
Noted. Team Lead  or Health Coach to follow as appropriate.

## 2016-06-28 NOTE — Care Management (Signed)
Patient continues to state that she has no home health needs.

## 2016-06-29 LAB — GLUCOSE, CAPILLARY: Glucose-Capillary: 99 mg/dL (ref 65–99)

## 2016-06-29 MED ORDER — CARVEDILOL 12.5 MG PO TABS
25.0000 mg | ORAL_TABLET | Freq: Two times a day (BID) | ORAL | Status: DC
  Administered 2016-06-29 – 2016-06-30 (×2): 25 mg via ORAL
  Filled 2016-06-29 (×2): qty 2

## 2016-06-29 NOTE — Progress Notes (Signed)
Patient is alert and oriented. Denies pain. Up independently in room.

## 2016-06-29 NOTE — Progress Notes (Signed)
   Discussed case with Dr. Domingo Mend.  Remains hypertensive despite multiple medications. Is on Clonidine (could convert to patch for more stable BP effect) + Carvedilol (prefer not to increase in setting of clonidine to avoid bradycardia).  On max dose of Hydralazine & Imdur combination as well as Max dose Cardura & Lisinopril & HCTZ (could convert to Chlorthalidone for more prolonged effect)  Apparently - was not as hypertensive prior to admission (? Related to chemo).  At this point - next option for antihypertensive Rx is to add Dihydropyridine CCB such as Amlodipine. Could also consider Spironolactone.  - would target BP ~140 mmHg for now (concen is that once all of the new medications get to steady state, she will drop precipitously).   Will follow along peripherally as no active Cardiac etiology.  Glenetta Hew, MD

## 2016-06-29 NOTE — Progress Notes (Signed)
[place] at [place] NAME: Cassidy Ortiz    MR#:  [id number]  DATE OF BIRTH:  Dec 01, 1941  SUBJECTIVE:  CHIEF COMPLAINT:   Chief Complaint  Patient presents with  . Back Pain  . Arm Pain  BP Is slowly improving  REVIEW OF SYSTEMS:  Review of Systems  Constitutional: Negative for fever, weight loss, malaise/fatigue and diaphoresis.  HENT: Negative for ear discharge, ear pain, hearing loss, nosebleeds, sore throat and tinnitus.   Eyes: Negative for blurred vision and pain.  Respiratory: Negative for cough, hemoptysis, shortness of breath and wheezing.   Cardiovascular: Negative for chest pain, palpitations, orthopnea and leg swelling.  Gastrointestinal: Negative for heartburn, vomiting, abdominal pain, diarrhea, constipation and blood in stool.  Genitourinary: Negative for dysuria, urgency and frequency.  Musculoskeletal: Negative for myalgias and back pain.  Skin: Negative for itching and rash.  Neurological: Negative for dizziness, tingling, tremors, focal weakness, seizures, weakness and headaches.  Psychiatric/Behavioral: Negative for depression. The patient is not nervous/anxious.    DRUG ALLERGIES:   Allergies  Allergen Reactions  . Amlodipine     Leg swelling   VITALS:  Blood pressure 145/61, pulse 68, temperature 97.8 F (36.6 C), temperature source Oral, resp. rate 19, height 5\' 7"  (1.702 m), weight 92.806 kg (204 lb 9.6 oz), SpO2 93 %. PHYSICAL EXAMINATION:  Physical Exam  Constitutional: She is oriented to person, place, and time and well-developed, well-nourished, and in no distress.  HENT:  Head: Normocephalic and atraumatic.  Eyes: Conjunctivae and EOM are normal. Pupils are equal, round, and reactive to light.  Neck: Normal range of motion. Neck supple. No tracheal deviation present. No thyromegaly present.  Cardiovascular: Normal rate, regular rhythm and normal heart sounds.   Pulmonary/Chest: Effort normal and  breath sounds normal. No respiratory distress. She has no wheezes. She exhibits no tenderness.  Abdominal: Soft. Bowel sounds are normal. She exhibits no distension. There is no tenderness.  Musculoskeletal: Normal range of motion.  Neurological: She is alert and oriented to person, place, and time. No cranial nerve deficit.  Skin: Skin is warm and dry. No rash noted.  Psychiatric: Mood and affect normal.   LABORATORY PANEL:   CBC  Recent Labs Lab 06/28/16 0521  WBC 16.2*  HGB 10.3*  HCT 30.4*  PLT 108*   ------------------------------------------------------------------------------------------------------------------ Chemistries   Recent Labs Lab 06/25/16 0514 06/28/16 0521  NA 142 141  K 4.0 4.4  CL 113* 106  CO2 28 29  GLUCOSE 132* 99  BUN 18 34*  CREATININE 1.15* 1.29*  CALCIUM 8.3* 8.8*  MG 2.1  --      Impression and plan: 75 y.o. female with a known history of breast cancer currently undergoing chemotherapy at [facility] Being admitted for  * Uncontrolled hypertension: Likely secondary to severe pain on presentation.  -Pain is well controlled today - Continue Coreg, clonidine, Cardura, hydralazine and increased  Lisinopril to 40 mg po , hydralazine 10 mg IV every 6 hour as needed for better blood pressure control. Titrate as needed - Added HCTZ and imdurDose increased yesterday  and weaned off Nitroprusside drip. If needed will change HCTZ to chlorthalidone and add spironolactone prn  -Appreciate cardio recommendations Discussed with the patient's primary oncologist, dr.Ennevan - [phone] who does not think this is an adverse effect of the chemotherapy, but he is considering to change the chemotherapy during the next cycle   * Hypokalemia - Repleted and resolved  * Acute on  Chronic kidney disease stage III - Likely due to dehydration, prerenal etiology 2/2 nausea and vomiting prob chemo induced - Improved with IV hydration- Cr- 1.29  * Elevated  troponin: due to supply demand ischemia secondary to the accelerated hypertension. No further cardiac workup done in the hospital. Pt is asymptomatic  * History of breast cancer follow-up with oncology as outpatient [name]' disease. Continue levothyroxine * History of sleep apnea * Depression. Continued psychiatric medications * Thrombocytopenia likely from recent chemotherapy  PT consult pending     All the records are reviewed and case discussed with Care Management/Social Worker. Management plans discussed with the patient, family (husband at bedside) and they are in agreement.  CODE STATUS: DO NOT RESUSCITATE  TOTAL TIME TAKING CARE OF THIS PATIENT: 35 minutes.   More than 50% of the time was spent in counseling/coordination of care: YES (d/w husband at bedside)  POSSIBLE D/C IN 1-2 DAYS, DEPENDING ON CLINICAL CONDITION. And BP control   Nicholes Mango M.D on 06/29/2016 at 3:46 PM  Between 7am to 6pm - Pager - [phone]  After 6pm go to [email or web address] - Proofreader  [facility] [place] [facility]  Office  [phone]  CC: Primary care physician; Rica Mast, MD  Note: This dictation was prepared with Dragon dictation along with smaller phrase technology. Any transcriptional errors that result from this process are unintentional.

## 2016-06-29 NOTE — Progress Notes (Signed)
PT Cancellation Note  Patient Details Name: Cassidy Ortiz MRN: [id number] DOB: [birth year]   Cancelled Treatment:    Reason Eval/Treat Not Completed: Other (comment);Fatigue/lethargy limiting ability to participate (Pt is too fatigued for therapy, pain finally more controlled).  Will retry tomorrow.   Ramond Dial 06/29/2016, 4:08 PM    Mee Hives, PT MS Acute Rehab Dept. Number: [place] and [place]

## 2016-06-29 NOTE — Care Management Important Message (Addendum)
Important Message  Patient Details  Name: Cassidy Ortiz MRN: [id number] Date of Birth: [birth year]   Medicare Important Message Given:  Yes    Carles Collet, RN 06/29/2016, 12:12 PM  Important Message  Patient Details  Name: Cassidy Ortiz MRN: [id number] Date of Birth: [birth year]   Medicare Important Message Given:  Yes    Carles Collet, RN 06/29/2016, 12:12 PM

## 2016-06-29 NOTE — Progress Notes (Signed)
Malnutrition screen score is 0

## 2016-06-30 LAB — GLUCOSE, CAPILLARY
GLUCOSE-CAPILLARY: 115 mg/dL — AB (ref 65–99)
GLUCOSE-CAPILLARY: 132 mg/dL — AB (ref 65–99)

## 2016-06-30 MED ORDER — ACETAMINOPHEN 325 MG PO TABS: 650.0000 mg | ORAL_TABLET | Freq: Four times a day (QID) | ORAL | Status: DC | PRN

## 2016-06-30 MED ORDER — DOXAZOSIN MESYLATE 8 MG PO TABS: 8.0000 mg | ORAL_TABLET | Freq: Two times a day (BID) | ORAL | 0 refills | Status: DC

## 2016-06-30 MED ORDER — CHLORTHALIDONE 25 MG PO TABS
50.0000 mg | ORAL_TABLET | Freq: Every day | ORAL | Status: DC
  Administered 2016-06-30: 50 mg via ORAL
  Filled 2016-06-30: qty 2

## 2016-06-30 MED ORDER — LISINOPRIL 40 MG PO TABS: 40.0000 mg | ORAL_TABLET | Freq: Every day | ORAL | 0 refills | Status: DC

## 2016-06-30 MED ORDER — HEPARIN SOD (PORK) LOCK FLUSH 10 UNIT/ML IV SOLN
10.0000 [IU] | Freq: Once | INTRAVENOUS | Status: DC
  Filled 2016-06-30: qty 1

## 2016-06-30 MED ORDER — ISOSORBIDE MONONITRATE ER 60 MG PO TB24: 60.0000 mg | ORAL_TABLET | Freq: Two times a day (BID) | ORAL | 0 refills | Status: DC

## 2016-06-30 MED ORDER — CHLORTHALIDONE 50 MG PO TABS: 50.0000 mg | ORAL_TABLET | Freq: Every day | ORAL | 0 refills | Status: DC

## 2016-06-30 MED ORDER — CLONIDINE HCL 0.2 MG PO TABS: 0.2000 mg | ORAL_TABLET | Freq: Three times a day (TID) | ORAL | 0 refills | Status: DC

## 2016-06-30 MED ORDER — CARVEDILOL 25 MG PO TABS: 25.0000 mg | ORAL_TABLET | Freq: Two times a day (BID) | ORAL | 0 refills | Status: DC

## 2016-06-30 NOTE — Discharge Instructions (Signed)
Activity as tolerated Diet low-salt Follow-up with primary care physician in a week Follow-up with oncology as recommended or in a week Follow-up with primary cardiologist Dr. Candis Musa in a week

## 2016-06-30 NOTE — Progress Notes (Signed)
PT Cancellation Note  Patient Details Name: Cassidy Ortiz MRN: [id number] DOB: Apr 20, 1941   Cancelled Treatment:    Reason Eval/Treat Not Completed: PT screened, no needs identified, will sign off   Kreg Shropshire 06/30/2016, 1:52 PM

## 2016-06-30 NOTE — Discharge Summary (Addendum)
[facility] NAME: Chaliyah Teson    MR#:  [id number]  DATE OF BIRTH:  [birth year]  DATE OF ADMISSION:  06/22/2016 ADMITTING PHYSICIAN: Max Sane, MD  DATE OF DISCHARGE: 06/30/2016  PRIMARY CARE PHYSICIAN: Rica Mast, MD    ADMISSION DIAGNOSIS:  Generalized pain [R52] Immunosuppressed status ([place]) [D89.9]  DISCHARGE DIAGNOSIS:  Active Problems:   Uncontrolled pain   Hypertensive emergency   Generalized pain   Malignant neoplasm of female breast ([place])   HOCM (hypertrophic obstructive cardiomyopathy) ([place])   SECONDARY DIAGNOSIS:   Past Medical History:  Diagnosis Date  . Abnormal weight gain   . Acquired cyst of kidney   . Anxiety state, unspecified   . Baker's cyst of knee    Left, pt does not remember  . Breast cancer ([place]) 2001   RT LUMPECTOMY  . Breast cancer of upper-inner quadrant of left female breast ([place]) 04/19/2016  . Cerebral aneurysm rupture (HCC)    stent  . Cerebral aneurysm, nonruptured 2008   s/p coil and shunt, performed in [place]  . Chronic kidney disease, stage III (moderate)   . Depressive disorder, not elsewhere classified   . Diverticulosis of colon (without mention of hemorrhage)   . Duodenal mass   . Duodenal ulcer   . Edema   . Fatty liver   . Grave's disease   . Hypertension   . Hypertensive kidney disease, benign   . Hypothyroid   . Malignant neoplasm of breast (female), unspecified site 2001   right, s/p lumpectomy and XRT  . Malignant neoplasm of kidney, except pelvis 2008   s/p partial nephrectomy  . Mixed hyperlipidemia   . Obstructive sleep apnea (adult) (pediatric)   . Personal history of colonic polyps 10/22/2002   hyperplastic   . Radiation 2001   BREAST CA  . Sleep apnea    off cpap  . Unspecified hypothyroidism   . Vitamin D deficiency     HOSPITAL COURSE:   75 y.o. female with a known history of breast cancer currently undergoing chemotherapy at [facility] Being admitted for  * Uncontrolled hypertension: Likely secondary to severe pain on presentation.  -Pain is well controlled , blood pressure is much better-systolic blood pressure at around 140-150 which is quite reasonable in the present situation - Continue Coreg, clonidine, Cardura, hydralazine and increased  Lisinopril to 40 mg po , hydralazine 10 mg IV every 6 hour as needed for better blood pressure control. Titrate as needed - Continue imdurDose increased yesterday  and weaned off Nitroprusside drip. If needed -changed HCTZ to chlorthalidone as per cardiology recommendations  -Eventually can add spironolactone if needed -Hesitant to make more changes in the blood pressure medications as she might become hypotensive in the next few days -Appreciate cardio recommendations Discussed with the patient's primary oncologist, dr.Ennevan - [phone] who does not think this is an adverse effect of the chemotherapy, but he is considering to change the chemotherapy during the next cycle   * Hypokalemia - Repleted and resolved  * Acute on Chronic kidney disease stage III - Likely due to dehydration, prerenal etiology 2/2 nausea and vomiting prob chemo induced - Improved with IV hydration- Cr- 1.29  * Elevated troponin: due to supply demand ischemia secondary to the accelerated hypertension. No further cardiac workup done in the hospital. Pt is asymptomatic  * History of breast cancer follow-up with oncology as outpatient [name]' disease. Continue levothyroxine * History of sleep  apnea * Depression. Continued psychiatric medications * Thrombocytopenia likely from recent chemotherapy  PT consult still pending patient wants to go home today,  DISCHARGE CONDITIONS:   fair  CONSULTS OBTAINED:  Treatment Team:  Wellington Hampshire, MD   PROCEDURES none  DRUG ALLERGIES:   Allergies  Allergen Reactions  . Amlodipine     Leg swelling    DISCHARGE MEDICATIONS:    Current Discharge Medication List    START taking these medications   Details  acetaminophen (TYLENOL) 325 MG tablet Take 2 tablets (650 mg total) by mouth every 6 (six) hours as needed for mild pain (or Fever >/= 101).    chlorthalidone (HYGROTON) 50 MG tablet Take 1 tablet (50 mg total) by mouth daily. Qty: 30 tablet, Refills: 0    isosorbide mononitrate (IMDUR) 60 MG 24 hr tablet Take 1 tablet (60 mg total) by mouth 2 (two) times daily. Qty: 60 tablet, Refills: 0      CONTINUE these medications which have CHANGED   Details  carvedilol (COREG) 25 MG tablet Take 1 tablet (25 mg total) by mouth 2 (two) times daily with a meal. Qty: 60 tablet, Refills: 0    cloNIDine (CATAPRES) 0.2 MG tablet Take 1 tablet (0.2 mg total) by mouth 3 (three) times daily. Qty: 60 tablet, Refills: 0    doxazosin (CARDURA) 8 MG tablet Take 1 tablet (8 mg total) by mouth 2 (two) times daily. Qty: 60 tablet, Refills: 0    lisinopril (PRINIVIL,ZESTRIL) 40 MG tablet Take 1 tablet (40 mg total) by mouth daily. Qty: 30 tablet, Refills: 0      CONTINUE these medications which have NOT CHANGED   Details  ALPRAZolam (XANAX) 0.25 MG tablet Take 1 tablet (0.25 mg total) by mouth 3 (three) times daily as needed for anxiety. Qty: 60 tablet, Refills: 4    aspirin 81 MG tablet Take 162 mg by mouth daily at 12 noon. Take at noon.    atorvastatin (LIPITOR) 40 MG tablet TAKE 1 TABLET DAILY Qty: 90 tablet, Refills: 3    buPROPion (WELLBUTRIN SR) 100 MG 12 hr tablet Take 100 mg by mouth 2 (two) times daily.    dexamethasone (DECADRON) 4 MG tablet Take 2 tablets by mouth once a day on the day after chemotherapy and then take 2 tablets two times a day for 2 days. Take with food. Qty: 30 tablet, Refills: 1   Associated Diagnoses: Breast cancer of upper-inner quadrant of left female breast (HCC)    docusate sodium (COLACE) 100 MG capsule Take 1 tablet once or twice daily as needed for constipation while taking  narcotic pain medicine Qty: 30 capsule, Refills: 0    esomeprazole (NEXIUM) 20 MG capsule Take 20 mg by mouth every morning.    fesoterodine (TOVIAZ) 4 MG TB24 tablet Take 4 mg by mouth every morning.     hydrALAZINE (APRESOLINE) 100 MG tablet TAKE 1 TABLET 3 TIMES A DAY Qty: 270 tablet, Refills: 3    levothyroxine (SYNTHROID) 175 MCG tablet Take 1 tablet (175 mcg total) by mouth daily. Qty: 90 tablet, Refills: 3    lidocaine (LIDODERM) 5 % Place 1 patch onto the skin daily. Leave for 12hr Qty: 15 patch, Refills: 6    lidocaine-prilocaine (EMLA) cream Apply to affected area as needed Qty: 30 g, Refills: 3   Associated Diagnoses: Breast cancer of upper-inner quadrant of left female breast (HCC)    LORazepam (ATIVAN) 0.5 MG tablet Take 1 tablet (0.5 mg total) by mouth  every 6 (six) hours as needed (Nausea or vomiting). Qty: 30 tablet, Refills: 0   Associated Diagnoses: Breast cancer of upper-inner quadrant of left female breast (HCC)    mirtazapine (REMERON SOL-TAB) 45 MG disintegrating tablet Take 45 mg by mouth at bedtime.   Associated Diagnoses: Stage 1 breast cancer, ER+, left (HCC)    Multiple Vitamin (MULTIVITAMIN) tablet Take 1 tablet by mouth daily.      nitroGLYCERIN (NITROSTAT) 0.4 MG SL tablet Place 1 tablet (0.4 mg total) under the tongue every 5 (five) minutes as needed for chest pain. Qty: 25 tablet, Refills: 3    ondansetron (ZOFRAN) 4 MG tablet Take 1-2 tabs by mouth every 8 hours as needed for nausea/vomiting Qty: 30 tablet, Refills: 0    oxyCODONE-acetaminophen (ROXICET) 5-325 MG tablet Take 1-2 tablets by mouth every 4 (four) hours as needed for severe pain. Qty: 30 tablet, Refills: 0    prochlorperazine (COMPAZINE) 10 MG tablet Take 1 tablet (10 mg total) by mouth every 6 (six) hours as needed (Nausea or vomiting). Qty: 30 tablet, Refills: 1   Associated Diagnoses: Breast cancer of upper-inner quadrant of left female breast (HCC)    rOPINIRole (REQUIP) 1 MG  tablet TAKE 1 TABLET (1 MG TOTAL) BY MOUTH DAILY AS NEEDED. Qty: 90 tablet, Refills: 1    traZODone (DESYREL) 100 MG tablet Take 100 mg by mouth every evening.         DISCHARGE INSTRUCTIONS:   Activity as tolerated Diet low-salt Follow-up with primary care physician in a week Follow-up with oncology as recommended or in a week Follow-up with primary cardiologist Dr. Candis Musa in a week  DIET:  Low salt  DISCHARGE CONDITION:  Fair  ACTIVITY:  Activity as tolerated  OXYGEN:  Home Oxygen: No.   Oxygen Delivery: room air  DISCHARGE LOCATION:  home   If you experience worsening of your admission symptoms, develop shortness of breath, life threatening emergency, suicidal or homicidal thoughts you must seek medical attention immediately by calling 911 or calling your MD immediately  if symptoms less severe.  You Must read complete instructions/literature along with all the possible adverse reactions/side effects for all the Medicines you take and that have been prescribed to you. Take any new Medicines after you have completely understood and accpet all the possible adverse reactions/side effects.   Please note  You were cared for by a hospitalist during your hospital stay. If you have any questions about your discharge medications or the care you received while you were in the hospital after you are discharged, you can call the unit and asked to speak with the hospitalist on call if the hospitalist that took care of you is not available. Once you are discharged, your primary care physician will handle any further medical issues. Please note that NO REFILLS for any discharge medications will be authorized once you are discharged, as it is imperative that you return to your primary care physician (or establish a relationship with a primary care physician if you do not have one) for your aftercare needs so that they can reassess your need for medications and monitor your lab  values.     Today  Chief Complaint  Patient presents with  . Back Pain  . Arm Pain   Pt is feeling fine. BP is better with systolic at around [id number]  ROS:  CONSTITUTIONAL: Denies fevers, chills. Denies any fatigue, weakness.  EYES: Denies blurry vision, double vision, eye pain. EARS, NOSE, THROAT: Denies tinnitus, ear  pain, hearing loss. RESPIRATORY: Denies cough, wheeze, shortness of breath.  CARDIOVASCULAR: Denies chest pain, palpitations, edema.  GASTROINTESTINAL: Denies nausea, vomiting, diarrhea, abdominal pain. Denies bright red blood per rectum. GENITOURINARY: Denies dysuria, hematuria. ENDOCRINE: Denies nocturia or thyroid problems. HEMATOLOGIC AND LYMPHATIC: Denies easy bruising or bleeding. SKIN: Denies rash or lesion. MUSCULOSKELETAL: Denies pain in neck, back, shoulder, knees, hips or arthritic symptoms.  NEUROLOGIC: Denies paralysis, paresthesias.  PSYCHIATRIC: Denies anxiety or depressive symptoms.   VITAL SIGNS:  Blood pressure (!) 172/62, pulse 60, temperature 98 F (36.7 C), temperature source Oral, resp. rate 20, height 5\' 7"  (1.702 m), weight 93.4 kg (205 lb 12.8 oz), SpO2 93 %.  I/O:    Intake/Output Summary (Last 24 hours) at 06/30/16 1234 Last data filed at 06/29/16 1900  Gross per 24 hour  Intake              840 ml  Output                0 ml  Net              840 ml    PHYSICAL EXAMINATION:  GENERAL:  75 y.o.-year-old patient lying in the bed with no acute distress.  EYES: Pupils equal, round, reactive to light and accommodation. No scleral icterus. Extraocular muscles intact.  HEENT: Head atraumatic, normocephalic. Oropharynx and nasopharynx clear.  NECK:  Supple, no jugular venous distention. No thyroid enlargement, no tenderness.  LUNGS: Normal breath sounds bilaterally, no wheezing, rales,rhonchi or crepitation. No use of accessory muscles of respiration.  CARDIOVASCULAR: S1, S2 normal. No murmurs, rubs, or gallops.  ABDOMEN: Soft,  non-tender, non-distended. Bowel sounds present. No organomegaly or mass.  EXTREMITIES: No pedal edema, cyanosis, or clubbing.  NEUROLOGIC: Cranial nerves II through XII are intact. Muscle strength 5/5 in all extremities. Sensation intact. Gait not checked.  PSYCHIATRIC: The patient is alert and oriented x 3.  SKIN: No obvious rash, lesion, or ulcer.   DATA REVIEW:   CBC  Recent Labs Lab 06/28/16 0521  WBC 16.2*  HGB 10.3*  HCT 30.4*  PLT 108*    Chemistries   Recent Labs Lab 06/25/16 0514 06/28/16 0521  NA 142 141  K 4.0 4.4  CL 113* 106  CO2 28 29  GLUCOSE 132* 99  BUN 18 34*  CREATININE 1.15* 1.29*  CALCIUM 8.3* 8.8*  MG 2.1  --     Cardiac Enzymes No results for input(s): TROPONINI in the last 168 hours.  Microbiology Results  Results for orders placed or performed during the hospital encounter of 06/22/16  MRSA PCR Screening     Status: None   Collection Time: 06/26/16  8:04 AM  Result Value Ref Range Status   MRSA by PCR NEGATIVE NEGATIVE Final    Comment:        The GeneXpert MRSA Assay (FDA approved for NASAL specimens only), is one component of a comprehensive MRSA colonization surveillance program. It is not intended to diagnose MRSA infection nor to guide or monitor treatment for MRSA infections.     RADIOLOGY:  No results found.  EKG:   Orders placed or performed during the hospital encounter of 06/22/16  . ED EKG  . ED EKG      Management plans discussed with the patient, family and they are in agreement.  CODE STATUS:     Code Status Orders        Start     Ordered   06/22/16 1407  Do  not attempt resuscitation (DNR)  Continuous    Question Answer Comment  In the event of cardiac or respiratory ARREST Do not call a "code blue"   In the event of cardiac or respiratory ARREST Do not perform Intubation, CPR, defibrillation or ACLS   In the event of cardiac or respiratory ARREST Use medication by any route, position, wound  care, and other measures to relive pain and suffering. May use oxygen, suction and manual treatment of airway obstruction as needed for comfort.      06/22/16 1406    Code Status History    Date Active Date Inactive Code Status Order ID Comments User Context   06/22/2016 11:19 AM 06/22/2016  2:06 PM Full Code [id number]  Max Sane, MD Inpatient   06/01/2016  9:19 AM 06/02/2016  5:55 PM Full Code [id number]  Loletha Grayer, MD ED    Advance Directive Documentation   Flowsheet Row Most Recent Value  Type of Advance Directive  Healthcare Power of Attorney, Living will  Pre-existing out of facility DNR order (yellow form or pink MOST form)  No data  "MOST" Form in Place?  No data      TOTAL TIME TAKING CARE OF THIS PATIENT: 45  minutes.   Note: This dictation was prepared with Dragon dictation along with smaller phrase technology. Any transcriptional errors that result from this process are unintentional.   @M[facility] @  on 06/30/2016 at 12:34 PM  Between 7am to 6pm - Pager - [phone]  After 6pm go to [email or web address] - password [email or web address] [facility]  Office  [phone]  CC: Primary care physician; Rica Mast, MD

## 2016-06-30 NOTE — Progress Notes (Addendum)
Pulse is 57.   Message sent to Dr. Margaretmary Eddy..  BP meds held until I talk with her. As her normal pulse is between 55 and 65, Dr. Margaretmary Eddy said to give it.

## 2016-06-30 NOTE — Progress Notes (Signed)
Patient's pulse is between 55-65 but patient is asymptomatic. We will continue the current medications. Discussed with the RN

## 2016-06-30 NOTE — Progress Notes (Addendum)
Patient discharged to home with her husband. Prescriptions printed for her.  Port flushed with heparin and de-accessed.  Small bandage over the site. 15:00 Patient called, she doesn't have a prescription for chlorthalidone.  Dr. Margaretmary Eddy notified with the pharmacy to send it to

## 2016-07-01 ENCOUNTER — Telehealth: Payer: Self-pay | Admitting: Cardiovascular Disease

## 2016-07-01 DIAGNOSIS — Z5181 Encounter for therapeutic drug level monitoring: Secondary | ICD-10-CM | POA: Diagnosis not present

## 2016-07-01 DIAGNOSIS — R42 Dizziness and giddiness: Secondary | ICD-10-CM | POA: Diagnosis not present

## 2016-07-01 DIAGNOSIS — I959 Hypotension, unspecified: Secondary | ICD-10-CM | POA: Diagnosis not present

## 2016-07-01 DIAGNOSIS — E876 Hypokalemia: Secondary | ICD-10-CM | POA: Diagnosis not present

## 2016-07-01 DIAGNOSIS — I10 Essential (primary) hypertension: Secondary | ICD-10-CM | POA: Diagnosis not present

## 2016-07-01 DIAGNOSIS — Z79899 Other long term (current) drug therapy: Secondary | ICD-10-CM | POA: Diagnosis not present

## 2016-07-01 DIAGNOSIS — Z7982 Long term (current) use of aspirin: Secondary | ICD-10-CM | POA: Diagnosis not present

## 2016-07-01 DIAGNOSIS — Z982 Presence of cerebrospinal fluid drainage device: Secondary | ICD-10-CM | POA: Diagnosis not present

## 2016-07-01 DIAGNOSIS — Z87891 Personal history of nicotine dependence: Secondary | ICD-10-CM | POA: Diagnosis not present

## 2016-07-01 DIAGNOSIS — R001 Bradycardia, unspecified: Secondary | ICD-10-CM | POA: Diagnosis not present

## 2016-07-01 DIAGNOSIS — C50919 Malignant neoplasm of unspecified site of unspecified female breast: Secondary | ICD-10-CM | POA: Diagnosis not present

## 2016-07-01 NOTE — Telephone Encounter (Signed)
Pt states she was D/C yesterday from [facility]. States that her BP this morning (about an hour ago) Before medication: 224/107 After medication: 230/104

## 2016-07-01 NOTE — Telephone Encounter (Signed)
Spoke w/ pt.  She reports her BP after 1 nitro is 184/94.   Advised her that Dr. Rockey Situ recommends she take a xanax now and see if this brings her pressures down. Advised her to contact [facility] neuro and see if they have any openings today, as pt's BP meds are maxed out and there is another component affecting the effectiveness of her meds.  Advised her that if her BP does not come down after taking xanax, for her to proceed to ED, preferably [facility], as they are familiar w/ her and can perform neuro testing on her.   Pt is appreciative of Dr. Rockey Situ taking the time to advise her w/o having to come in for appt.  Asked her to call back this afternoon and let me know how she is doing.

## 2016-07-01 NOTE — Telephone Encounter (Signed)
Pt is currently in [facility] ED.

## 2016-07-01 NOTE — Telephone Encounter (Signed)
Spoke w/ pt.  She is very upset, demanding to be seen today, as this is an emergency. She reports that her SBP was 230 before her am meds. 224/107 now after taking  Coreg 25 mg Chlorthalidone 50 mg  Clonidine 0.2 mg cardura 8 mg  Hydralazine 100 mg  Imdur 60 m Lisinopril 40 mg She has rx for xanax, but does not take, as she "already takes 21 pills, I don't want to take any more". Pt denies any pain, states that she has been relaxed and she does not feel that stress or anxiety is playing a role in BP.  Advised her to sit down and place 1 nitro under tongue to temporarily lower her BP until I speak w/ Dr. Rockey Situ.  She apologizes for raising her voice, states that she is scared and frustrated.

## 2016-07-02 ENCOUNTER — Telehealth: Payer: Self-pay | Admitting: *Deleted

## 2016-07-02 ENCOUNTER — Encounter: Payer: Self-pay | Admitting: Internal Medicine

## 2016-07-02 NOTE — Telephone Encounter (Signed)
Please advise for a Nurse Visit and new referral for Cardiology, thanks

## 2016-07-02 NOTE — Telephone Encounter (Signed)
She needs to be seen. She is scheduled for follow up this week.

## 2016-07-02 NOTE — Telephone Encounter (Signed)
Spoke with Patient and explained that she could discuss this at the appt tomorrow. thanks

## 2016-07-02 NOTE — Telephone Encounter (Signed)
Transition Care Management Follow-up Telephone Call  How have you been since you were released from the hospital? Not good, BP is high initially and then dropped to to taking nitrostat and xanax she was advised to go to the [facility] ER, BP was 83/53.  Spent the day in the ED trying to get her BP up after that.     Do you understand why you were in the hospital? Kept to get BP back up   Do you understand the discharge instrcutions?  See the Doctor as soon as possible.   Items Reviewed:  Medications reviewed: no changes  Allergies reviewed: no changes  Dietary changes reviewed: no changes  Referrals reviewed: none completed   Functional Questionnaire:   Activities of Daily Living (ADLs):   She states they are independent in the following: non States they require assistance with the following:    Any transportation issues/concerns?:no issues   Any patient concerns? Only concern is with cardiology follow up , see below. thanks  Confirmed importance and date/time of follow-up visits scheduled:  Yes follow up with PCP tomorrow  Confirmed with patient if condition begins to worsen call PCP or go to the ER.  Patient was given the Call-a-Nurse line [phone]:  Rockey Situ was the current cardiologist, august 21st is the next available appointment with him, she wants a new cardiologist that will see her sooner.

## 2016-07-02 NOTE — Telephone Encounter (Signed)
Patient is currently on chemotherapy, she would like to have a order to have her blood pressure checked in this office. She would like to be referred to a different cardiologist.  Pt contact [phone]

## 2016-07-02 NOTE — Telephone Encounter (Signed)
Spoke w/ pt.  She reports that after taking nitro & xanax, her BP bottomed out and was very low when she arrived at [facility] ED. She went through several tests and was advised that her neuro issues were not affecting her BP. She is sched for appt to f/u w/ neuro. Her BP was low earlier, 113/62, she is afraid to take any BP meds. Advised her to hold meds tonight.  Advised her that Dr. Rockey Situ had a cancellation tomorrow @ 11:40 and offered this to her.  She gladly accepts, as she called our office earlier today requesting an appt.  She is appreciative of the call and for Dr. Donivan Scull concern.

## 2016-07-03 ENCOUNTER — Encounter: Payer: Self-pay | Admitting: Cardiovascular Disease

## 2016-07-03 ENCOUNTER — Ambulatory Visit (INDEPENDENT_AMBULATORY_CARE_PROVIDER_SITE_OTHER): Payer: Medicare Other | Admitting: Cardiovascular Disease

## 2016-07-03 ENCOUNTER — Ambulatory Visit (INDEPENDENT_AMBULATORY_CARE_PROVIDER_SITE_OTHER): Payer: Medicare Other | Admitting: Internal Medicine

## 2016-07-03 ENCOUNTER — Encounter: Payer: Self-pay | Admitting: Internal Medicine

## 2016-07-03 VITALS — BP 110/60 | HR 72 | Ht 67.0 in | Wt 199.0 lb

## 2016-07-03 VITALS — BP 140/66 | HR 68 | Ht 67.0 in | Wt 201.0 lb

## 2016-07-03 DIAGNOSIS — I5031 Acute diastolic (congestive) heart failure: Secondary | ICD-10-CM

## 2016-07-03 DIAGNOSIS — I421 Obstructive hypertrophic cardiomyopathy: Secondary | ICD-10-CM

## 2016-07-03 DIAGNOSIS — I161 Hypertensive emergency: Secondary | ICD-10-CM

## 2016-07-03 DIAGNOSIS — C50912 Malignant neoplasm of unspecified site of left female breast: Secondary | ICD-10-CM

## 2016-07-03 DIAGNOSIS — I129 Hypertensive chronic kidney disease with stage 1 through stage 4 chronic kidney disease, or unspecified chronic kidney disease: Secondary | ICD-10-CM | POA: Diagnosis not present

## 2016-07-03 DIAGNOSIS — I671 Cerebral aneurysm, nonruptured: Secondary | ICD-10-CM

## 2016-07-03 DIAGNOSIS — Z982 Presence of cerebrospinal fluid drainage device: Secondary | ICD-10-CM

## 2016-07-03 DIAGNOSIS — Z170 Estrogen receptor positive status [ER+]: Secondary | ICD-10-CM

## 2016-07-03 DIAGNOSIS — F332 Major depressive disorder, recurrent severe without psychotic features: Secondary | ICD-10-CM

## 2016-07-03 DIAGNOSIS — R52 Pain, unspecified: Secondary | ICD-10-CM | POA: Diagnosis not present

## 2016-07-03 DIAGNOSIS — R809 Proteinuria, unspecified: Secondary | ICD-10-CM | POA: Diagnosis not present

## 2016-07-03 DIAGNOSIS — I10 Essential (primary) hypertension: Secondary | ICD-10-CM

## 2016-07-03 DIAGNOSIS — N183 Chronic kidney disease, stage 3 (moderate): Secondary | ICD-10-CM | POA: Diagnosis not present

## 2016-07-03 DIAGNOSIS — C50012 Malignant neoplasm of nipple and areola, left female breast: Secondary | ICD-10-CM

## 2016-07-03 DIAGNOSIS — D631 Anemia in chronic kidney disease: Secondary | ICD-10-CM | POA: Diagnosis not present

## 2016-07-03 MED ORDER — ALPRAZOLAM 0.25 MG PO TABS: 0.2500 mg | ORAL_TABLET | Freq: Three times a day (TID) | ORAL | 0 refills | Status: DC | PRN

## 2016-07-03 MED ORDER — HYDRALAZINE HCL 100 MG PO TABS: 100.0000 mg | ORAL_TABLET | Freq: Three times a day (TID) | ORAL | 3 refills | Status: DC

## 2016-07-03 MED ORDER — CLONIDINE HCL 0.1 MG PO TABS: 0.1000 mg | ORAL_TABLET | Freq: Three times a day (TID) | ORAL | 3 refills | Status: DC

## 2016-07-03 MED ORDER — CARVEDILOL 12.5 MG PO TABS: 12.5000 mg | ORAL_TABLET | Freq: Two times a day (BID) | ORAL | 3 refills | Status: DC

## 2016-07-03 MED ORDER — DOXAZOSIN MESYLATE 4 MG PO TABS: 4.0000 mg | ORAL_TABLET | Freq: Every day | ORAL | 3 refills | Status: DC

## 2016-07-03 MED ORDER — ISOSORBIDE MONONITRATE ER 60 MG PO TB24: 60.0000 mg | ORAL_TABLET | ORAL | 6 refills | Status: DC | PRN

## 2016-07-03 MED ORDER — MIRTAZAPINE 15 MG PO TBDP: 15.0000 mg | ORAL_TABLET | Freq: Every day | ORAL | 6 refills | Status: DC

## 2016-07-03 MED ORDER — CHLORTHALIDONE 50 MG PO TABS: 50.0000 mg | ORAL_TABLET | ORAL | 6 refills | Status: DC | PRN

## 2016-07-03 MED ORDER — LISINOPRIL 10 MG PO TABS: 10.0000 mg | ORAL_TABLET | Freq: Every day | ORAL | 3 refills | Status: DC

## 2016-07-03 NOTE — Patient Instructions (Addendum)
Decrease Remeron to 15mg  daily at bedtime.  Follow up 4 weeks.

## 2016-07-03 NOTE — Assessment & Plan Note (Signed)
BP Readings from Last 3 Encounters:  07/03/16 110/60  06/30/16 (!) 172/62  06/22/16 (!) 163/81   BP well controlled. Reviewed changes made by Dr. Rockey Situ today. Will continue to monitor BP.

## 2016-07-03 NOTE — Assessment & Plan Note (Signed)
Recent severe episode of uncontrolled pain. Question if this may have been caused by Neulasta given timeframe. She will discuss with oncologist. Given that percocet did not help with pain on last two occasions, discussed some other options including Fentanyl spray or film, however would need to use lower dosing as she is narcotic naive.

## 2016-07-03 NOTE — Patient Instructions (Addendum)
Medication Instructions:  Go back on the previous list:  Coreg 12.5 twice a day Lisinopril 10 mg daily cardura 4 mg daily Clonidine 0.1mg  three times a day Hydralazine 100 mg three times a day  Stop the isosorbide, chlorthalidone  Monitor pressure If pressure runs >170,  Consider taking xanax  If pressures do not come down on xanax, Take a isosorbide x 1 (long acting nitro)   Follow-Up: It was a pleasure seeing you in the office today. Please call us if you have new issues that need to be addressed before your next appt.  [phone]  Your physician wants you to follow-up in: 6 months.  You will receive a reminder letter in the mail two months in advance. If you don't receive a letter, please call our office to schedule the follow-up appointment.  If you need a refill on your cardiac medications before your next appointment, please call your pharmacy.

## 2016-07-03 NOTE — Progress Notes (Signed)
Subjective:    Patient ID: Cassidy Ortiz, female    DOB: [birth year], 75 y.o.   MRN: [id number]  HPI  75YO female presents for hospital follow up.  ADMITTED: 06/22/2016 DISCHARGED: 06/30/2016  DIAGNOSIS: Generalized pain, hypertensive emergency  First round of chemo, had pain on day 8 after chemo. Described as severe pain located across posterior chest. Admitted for pain management. Second round of chemo, had pain again on day 8 along with extremely high BP. Admitted for 9 days. Discharged home with Percocet for pain management.  Pain recurred. BP was extremely high. Went to [facility] ER for evaluation and took NTG on way. Then had observation at [facility] on Monday and BP was low 80/50s. Sent home that night. She was seen by [facility] and no issues noted with shunt. Seen by Cardiology and Nephrology today. Stopped Imdur and Chlorthalidone. Recommended lowering Remeron dose. Follow up is pending tomorrow with Dr. Marin Olp.  Today, feeling well. Cooked her husband a big meal for his birthday. Denies any pain. Having some trouble sleeping at night. Instructed by her cardiologist to use Xanax prn for anxiety and high BP. Has not used to date.  Wt Readings from Last 3 Encounters:  07/03/16 201 lb (91.2 kg)  07/03/16 199 lb (90.3 kg)  06/30/16 205 lb 12.8 oz (93.4 kg)   BP Readings from Last 3 Encounters:  07/03/16 140/66  07/03/16 110/60  06/30/16 (!) 172/62    Past Medical History:  Diagnosis Date  . Abnormal weight gain   . Acquired cyst of kidney   . Anxiety state, unspecified   . Baker's cyst of knee    Left, pt does not remember  . Breast cancer ([place]) 2001   RT LUMPECTOMY  . Breast cancer of upper-inner quadrant of left female breast ([place]) 04/19/2016  . Cerebral aneurysm rupture (HCC)    stent  . Cerebral aneurysm, nonruptured 2008   s/p coil and shunt, performed in [place]  . Chronic kidney disease, stage III (moderate)   . Depressive disorder, not elsewhere classified   . Diverticulosis of  colon (without mention of hemorrhage)   . Duodenal mass   . Duodenal ulcer   . Edema   . Fatty liver   . Grave's disease   . Hypertension   . Hypertensive kidney disease, benign   . Hypothyroid   . Malignant neoplasm of breast (female), unspecified site 2001   right, s/p lumpectomy and XRT  . Malignant neoplasm of kidney, except pelvis 2008   s/p partial nephrectomy  . Mixed hyperlipidemia   . Obstructive sleep apnea (adult) (pediatric)   . Personal history of colonic polyps 10/22/2002   hyperplastic   . Radiation 2001   BREAST CA  . Sleep apnea    off cpap  . Unspecified hypothyroidism   . Vitamin D deficiency    Family History  Problem Relation Age of Onset  . Colon cancer Sister   . Cancer Sister     colon  . Heart disease Father   . Heart disease Mother   . Esophageal cancer Neg Hx   . Rectal cancer Neg Hx   . Stomach cancer Neg Hx    Past Surgical History:  Procedure Laterality Date  . BREAST BIOPSY Left 03/14/2016   stereo  . BREAST LUMPECTOMY Right    right  . CATARACT EXTRACTION     bilateral  . COLONOSCOPY     last 2012.+ TA polyps  . CORONARY STENT PLACEMENT     brain  .  CSF SHUNT  08/17/2008  . FEMORAL ARTERY REPAIR  2011  . HEMIARTHROPLASTY SHOULDER FRACTURE     left  . PARTIAL NEPHRECTOMY     right  . RADIOACTIVE SEED GUIDED MASTECTOMY WITH AXILLARY SENTINEL LYMPH NODE BIOPSY Left 04/19/2016   Procedure: RADIOACTIVE SEED GUIDED PARTIAL MASTECTOMY WITH AXILLARY SENTINEL LYMPH NODE BIOPSY;  Surgeon: Fanny Skates, MD;  Location: [facility];  Service: General;  Laterality: Left;  . SHOULDER SURGERY     left  . UPPER GASTROINTESTINAL ENDOSCOPY  11-10-2014  . VAGINAL DELIVERY     3   Social History   Social History  . Marital status: Married    Spouse name: N/A  . Number of children: 3  . Years of education: N/A   Occupational History  . retired    Social History Main Topics  . Smoking status: Former Smoker    Packs/day:  1.00    Years: 43.00    Types: Cigarettes    Start date: 04/01/1964    Quit date: 11/24/2007  . Smokeless tobacco: Never Used     Comment: quit smoking 7 years ago  . Alcohol use 0.6 oz/week    1 Glasses of wine per week     Comment: occasssionally  . Drug use: No  . Sexual activity: Not Currently   Other Topics Concern  . None   Social History Narrative   Lives in [place] with husband. From [place]. Children live [place] and [place].   Pets - 1 cat in home.      Work - retired Theme park manager, Educational psychologist   Diet - regular, limited calories          Review of Systems  Constitutional: Negative for appetite change, chills, fatigue, fever and unexpected weight change.  Eyes: Negative for visual disturbance.  Respiratory: Negative for cough, chest tightness and shortness of breath.   Cardiovascular: Negative for chest pain and leg swelling.  Gastrointestinal: Negative for abdominal pain, constipation, diarrhea, nausea and vomiting.  Musculoskeletal: Positive for arthralgias, back pain and myalgias.  Skin: Negative for color change and rash.  Hematological: Negative for adenopathy. Does not bruise/bleed easily.  Psychiatric/Behavioral: Positive for sleep disturbance. Negative for dysphoric mood. The patient is not nervous/anxious.        Objective:    BP 140/66   Pulse 68   Ht 5\' 7"  (1.702 m)   Wt 201 lb (91.2 kg)   SpO2 91%   BMI 31.48 kg/m  Physical Exam  Constitutional: She is oriented to person, place, and time. She appears well-developed and well-nourished. No distress.  HENT:  Head: Normocephalic and atraumatic.  Right Ear: External ear normal.  Left Ear: External ear normal.  Nose: Nose normal.  Mouth/Throat: Oropharynx is clear and moist. No oropharyngeal exudate.  Eyes: Conjunctivae are normal. Pupils are equal, round, and reactive to light. Right eye exhibits no discharge. Left eye exhibits no discharge. No scleral icterus.  Neck: Normal range of motion. Neck supple. No tracheal  deviation present. No thyromegaly present.  Cardiovascular: Normal rate, regular rhythm, normal heart sounds and intact distal pulses.  Exam reveals no gallop and no friction rub.   No murmur heard. Pulmonary/Chest: Effort normal and breath sounds normal. No respiratory distress. She has no wheezes. She has no rales. She exhibits no tenderness.  Musculoskeletal: Normal range of motion. She exhibits no edema or tenderness.  Lymphadenopathy:    She has no cervical adenopathy.  Neurological: She is alert and oriented to person, place, and  time. No cranial nerve deficit. She exhibits normal muscle tone. Coordination normal.  Skin: Skin is warm and dry. No rash noted. She is not diaphoretic. No erythema. No pallor.  Psychiatric: She has a normal mood and affect. Her behavior is normal. Judgment and thought content normal.          Assessment & Plan:  Over 72min of which >50% spent in face-to-face contact with patient discussing plan of care  Problem List Items Addressed This Visit      Unprioritized   Hypertension - Primary (Chronic)    BP Readings from Last 3 Encounters:  07/03/16 110/60  06/30/16 (!) 172/62  06/22/16 (!) 163/81   BP well controlled. Reviewed changes made by Dr. Rockey Situ today. Will continue to monitor BP.      Stage 1 breast cancer, ER+ (HCC) (Chronic)    Scheduled to meet with Dr. Marin Olp tomorrow. Question if Neulasta might be causing bony pain. She will ask him about this and make plan for future chemotherapy.      Relevant Medications   mirtazapine (REMERON SOL-TAB) 15 MG disintegrating tablet   Uncontrolled pain (Chronic)    Recent severe episode of uncontrolled pain. Question if this may have been caused by Neulasta given timeframe. She will discuss with oncologist. Given that percocet did not help with pain on last two occasions, discussed some other options including Fentanyl spray or film, however would need to use lower dosing as she is narcotic naive.        Other Visit Diagnoses   None.      Return in about 4 weeks (around 07/31/2016) for New Patient.  Ronette Deter, MD Internal Medicine [facility]

## 2016-07-03 NOTE — Progress Notes (Signed)
Cardiology Office Note  Date:  07/03/2016   ID:  Daine Shearon, DOB 12-20-1940, MRN [id number]  PCP:  Rica Mast, MD   Chief Complaint  Patient presents with  . Other    Flunctuating BP pt would like to discuss BP medications. Meds reviewed verbally with pt.    HPI:  Ms. 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  Recent discharge from the hospital for severe hypertension, presents today for follow-up of her blood pressure  Recently spent approximately 9 days in the hospital for malignant hypertension Symptoms developed 1 week after chemotherapy rounds #2 She had acute onset of pain in her shoulders, back, nausea with vomiting, Persistently elevated blood pressure GI symptoms resolved but blood pressure stayed high requiring numerous oral medications, even nitro drip At the time of discharge, she was on maximum doses of numerous medications  We received a phone call 2 days ago that morning blood pressure was [id number] systolic We've recommended she take Xanax, all of her morning medications, she also took sublingual nitroglycerin. I recommendation was that if blood pressure did not improve that she go to [facility] where she had been previously seen for her cerebral shunt and aneurysms. She reports that she went to [facility] and on arrival systolic pressure was in the 80s She was evaluated emergency room physicians and neurosurgery.  Since then she reports blood pressure has been well controlled, this morning is [id number] systolic Husband  had to hold several of her medications this morning She is scheduled for chemotherapy tomorrow, round #3 Of note she did have severe pain, brief hypertension one week after chemotherapy rounds #1  She feels the Xanax may have helped her recent symptoms on Monday "Maybe I am stressed on the inside" Some difficulty sleeping  Past medical history went to the emergency room 04/03/2015 for severe hypertension Systolic pressures were greater than 200.  compliance with her medication She was not anxious, was not in pain, denied any stressors that could have contributed to her high blood pressure  they gave her hydralazine IV, gave her her regular afternoon medications   creatinine 1.49, BUN 31, hematocrit 40, EKG with normal sinus rhythm, rate 56 bpm, CT scan of the head with no acute findings   EGD on December 3 and had severe hypertension prior to the procedure. EGD showed small region of Barrett's esophagus After she got home, blood pressure seem to improve down to her baseline in the 130 range Went in for colonoscopy 11/18/2014 and again had severe hypertension Again after the procedure, went home and blood pressure significantly improved  Blood work showing total cholesterol 150, LDL 69  Echocardiogram showed severe LVH, mild outflow tract gradient. No mention of elevated right ventricular systolic pressures. Previous  Problems with anemia before requiring IM iron, now resolved  Previous EGD showed  ulcer, H. Pylori negative.  Earlier in 2013, we discontinued the diltiazem and Her edema resolved.  She reports that in followup today, she is depressed. Recently started on Remeron for sleep over the past several months. Uncertain if this is helping  but sleep is okay. She's not doing any exercise. Very sedentary. Feels her blood pressure is doing well but is not checking any numbers. Weight continues to trend upwards  previous assessment/evaluation at [facility] for history of aneurysms and  shunt (leak?) showed that shunt is not working but it is not a problem. No recent carotid ultrasound  PMH:   has a past medical history of Abnormal weight gain; Acquired cyst of kidney; Anxiety state, unspecified; Baker's cyst of knee; Breast cancer ([place]) (2001); Breast cancer of upper-inner quadrant of left female breast ([place]) (04/19/2016); Cerebral aneurysm rupture ([place]); Cerebral aneurysm, nonruptured (2008); Chronic kidney disease, stage III (moderate); Depressive disorder, not elsewhere classified; Diverticulosis of colon (without mention of hemorrhage); Duodenal mass; Duodenal ulcer; Edema; Fatty liver; Grave's disease; Hypertension; Hypertensive kidney disease, benign; Hypothyroid; Malignant neoplasm of breast (female), unspecified site (2001); Malignant neoplasm of kidney, except pelvis (2008); Mixed hyperlipidemia; Obstructive sleep apnea (adult) (pediatric); Personal history of colonic polyps (10/22/2002); Radiation (2001); Sleep apnea; Unspecified hypothyroidism; and Vitamin D deficiency.  PSH:    Past Surgical History:  Procedure Laterality Date  . BREAST BIOPSY Left 03/14/2016   stereo  . BREAST LUMPECTOMY Right    right  . CATARACT EXTRACTION     bilateral  . COLONOSCOPY     last 2012.+ TA polyps  . CORONARY STENT PLACEMENT     brain  . CSF SHUNT  08/17/2008  . FEMORAL ARTERY REPAIR  2011  . HEMIARTHROPLASTY SHOULDER FRACTURE     left  . PARTIAL NEPHRECTOMY     right  . RADIOACTIVE SEED GUIDED MASTECTOMY WITH AXILLARY SENTINEL LYMPH NODE BIOPSY Left 04/19/2016   Procedure: RADIOACTIVE SEED GUIDED PARTIAL MASTECTOMY WITH AXILLARY SENTINEL LYMPH NODE BIOPSY;  Surgeon: Fanny Skates, MD;  Location: [facility];  Service: General;  Laterality: Left;  . SHOULDER SURGERY     left  . UPPER GASTROINTESTINAL ENDOSCOPY  11-10-2014  . VAGINAL DELIVERY     3    Current Outpatient Prescriptions  Medication Sig Dispense Refill  . acetaminophen (TYLENOL) 325 MG tablet Take  2 tablets (650 mg total) by mouth every 6 (six) hours as needed for mild pain (or Fever >/= 101).    Marland Kitchen ALPRAZolam (XANAX) 0.25 MG tablet Take 1 tablet (0.25 mg total) by mouth 3 (three) times daily as needed for anxiety. 30 tablet 0  . aspirin 81 MG tablet Take 162 mg by mouth daily at 12 noon. Take at noon.    Marland Kitchen atorvastatin (LIPITOR) 40 MG tablet TAKE 1 TABLET DAILY 90 tablet 3  . buPROPion (WELLBUTRIN SR) 100 MG 12 hr tablet Take 100 mg by mouth 2 (two) times daily.    . carvedilol (COREG) 12.5 MG tablet Take 1 tablet (12.5 mg total) by mouth 2 (two) times daily with a meal. 90 tablet 3  . chlorthalidone (HYGROTON) 50 MG tablet Take 1 tablet (50 mg total) by mouth as needed (high blood pressure). 30 tablet 6  . cloNIDine (CATAPRES) 0.1 MG tablet Take 1 tablet (0.1 mg total) by mouth 3 (three) times daily. 270 tablet 3  . dexamethasone (DECADRON) 4 MG tablet Take 2 tablets by mouth once a day on the day after chemotherapy and then take 2 tablets two times a day for 2 days. Take with food. 30 tablet 1  . docusate sodium (COLACE) 100 MG capsule Take 1 tablet once or twice daily as needed for constipation while taking narcotic pain medicine 30 capsule 0  . doxazosin (CARDURA)  4 MG tablet Take 1 tablet (4 mg total) by mouth daily. 90 tablet 3  . esomeprazole (NEXIUM) 20 MG capsule Take 20 mg by mouth every morning.    . fesoterodine (TOVIAZ) 4 MG TB24 tablet Take 4 mg by mouth every morning.     . hydrALAZINE (APRESOLINE) 100 MG tablet Take 1 tablet (100 mg total) by mouth 3 (three) times daily. 270 tablet 3  . isosorbide mononitrate (IMDUR) 60 MG 24 hr tablet Take 1 tablet (60 mg total) by mouth as needed (for high blood pressure). 30 tablet 6  . levothyroxine (SYNTHROID) 175 MCG tablet Take 1 tablet (175 mcg total) by mouth daily. 90 tablet 3  . lidocaine (LIDODERM) 5 % Place 1 patch onto the skin daily. Leave for 12hr 15 patch 6  . lidocaine-prilocaine (EMLA) cream Apply to affected area as needed  30 g 3  . lisinopril (PRINIVIL,ZESTRIL) 10 MG tablet Take 1 tablet (10 mg total) by mouth daily. 90 tablet 3  . LORazepam (ATIVAN) 0.5 MG tablet Take 1 tablet (0.5 mg total) by mouth every 6 (six) hours as needed (Nausea or vomiting). 30 tablet 0  . mirtazapine (REMERON SOL-TAB) 45 MG disintegrating tablet Take 45 mg by mouth at bedtime.    . Multiple Vitamin (MULTIVITAMIN) tablet Take 1 tablet by mouth daily.      . nitroGLYCERIN (NITROSTAT) 0.4 MG SL tablet Place 1 tablet (0.4 mg total) under the tongue every 5 (five) minutes as needed for chest pain. 25 tablet 3  . ondansetron (ZOFRAN) 4 MG tablet Take 1-2 tabs by mouth every 8 hours as needed for nausea/vomiting 30 tablet 0  . oxyCODONE-acetaminophen (ROXICET) 5-325 MG tablet Take 1-2 tablets by mouth every 4 (four) hours as needed for severe pain. 30 tablet 0  . prochlorperazine (COMPAZINE) 10 MG tablet Take 1 tablet (10 mg total) by mouth every 6 (six) hours as needed (Nausea or vomiting). 30 tablet 1  . rOPINIRole (REQUIP) 1 MG tablet TAKE 1 TABLET (1 MG TOTAL) BY MOUTH DAILY AS NEEDED. 90 tablet 1  . traZODone (DESYREL) 100 MG tablet Take 100 mg by mouth every evening.     No current facility-administered medications for this visit.    Facility-Administered Medications Ordered in Other Visits  Medication Dose Route Frequency Provider Last Rate Last Dose  . 0.9 %  sodium chloride infusion   Intravenous Continuous Volanda Napoleon, MD   Stopped at 04/20/15 1651     Allergies:   Amlodipine   Social History:  The patient  reports that she quit smoking about 8 years ago. Her smoking use included Cigarettes. She started smoking about 52 years ago. She has a 43.00 pack-year smoking history. She has never used smokeless tobacco. She reports that she drinks about 0.6 oz of alcohol per week . She reports that she does not use drugs.   Family History:   family history includes Cancer in her sister; Colon cancer in her sister; Heart disease in her  father and mother.    Review of Systems: Review of Systems  Constitutional: Negative.   Respiratory: Negative.   Cardiovascular: Negative.   Gastrointestinal: Negative.   Musculoskeletal: Negative.   Neurological: Negative.   Psychiatric/Behavioral: The patient is nervous/anxious.   All other systems reviewed and are negative.    PHYSICAL EXAM: VS:  BP 110/60 (BP Location: Left Arm, Patient Position: Sitting, Cuff Size: Normal)   Pulse 72   Ht 5\' 7"  (1.702 m)   Wt 199 lb (  90.3 kg)   BMI 31.17 kg/m  , BMI Body mass index is 31.17 kg/m. GEN: Well nourished, well developed, in no acute distress  HEENT: normal  Neck: no JVD, carotid bruits, or masses Cardiac: RRR; no murmurs, rubs, or gallops,no edema  Respiratory:  clear to auscultation bilaterally, normal work of breathing GI: soft, nontender, nondistended, + BS MS: no deformity or atrophy  Skin: warm and dry, no rash Neuro:  Strength and sensation are intact Psych: euthymic mood, full affect    Recent Labs: 06/22/2016: ALT 12 06/25/2016: Magnesium 2.1 06/28/2016: BUN 34; Creatinine, Ser 1.29; Hemoglobin 10.3; Platelets 108; Potassium 4.4; Sodium 141    Lipid Panel Lab Results  Component Value Date   CHOL 150 07/13/2014   HDL 58.20 07/13/2014   LDLCALC 69 07/13/2014   TRIG 114.0 07/13/2014      Wt Readings from Last 3 Encounters:  07/03/16 199 lb (90.3 kg)  06/30/16 205 lb 12.8 oz (93.4 kg)  06/22/16 195 lb (88.5 kg)       ASSESSMENT AND PLAN:  Cerebral aneurysm She has outpatient follow-up at [facility] in one month for further evaluation Unclear if her neurologic issues can explain her malignant hypertension, recent episode that was very difficult to treat the hospital  Acute diastolic CHF (congestive heart failure) ([place]) Appears relatively euvolemic if not mildly dehydrated We will stop her chlorthalidone given low blood pressure, elevated creatinine  HOCM (hypertrophic obstructive cardiomyopathy)  (HCC) Previously noted on echocardiogram, relatively asymptomatic  Hypertensive emergency Recent hospitalization requiring nitroglycerin drip for several days, dramatic escalation of her medications, now back to her baseline Recommended she go back on her previous list of medications prior to the recent hospitalization  Malignant neoplasm of areola of left breast in female [facility]) She has completed 2 rounds of chemotherapy, scheduled for third round tomorrow Unclear if episodes of severe pain 7 days after chemotherapy is related to any of her treatments  S/P VP shunt Recently seen in the emergency room at [facility], told there was no problem, has follow-up in one month  Major depressive disorder, recurrent, severe without psychotic features ([place]) Recommended she try Xanax for severe hypertension in the future Seem to bring down her pressures 2 days ago on Monday  Uncontrolled pain Etiology unclear, she will talk with Dr. Gilford Rile and oncology She has had pain twice leading to visits to the emergency room   Total encounter time more than 25 minutes  Greater than 50% was spent in counseling and coordination of care with the patient   Disposition:   F/U  3 months   Signed, Esmond Plants, M.D., Ph.D. 07/03/2016  [place], [place]

## 2016-07-03 NOTE — Assessment & Plan Note (Signed)
Scheduled to meet with Dr. Marin Olp tomorrow. Question if Neulasta might be causing bony pain. She will ask him about this and make plan for future chemotherapy.

## 2016-07-04 ENCOUNTER — Other Ambulatory Visit (HOSPITAL_BASED_OUTPATIENT_CLINIC_OR_DEPARTMENT_OTHER): Payer: Medicare Other

## 2016-07-04 ENCOUNTER — Ambulatory Visit (HOSPITAL_BASED_OUTPATIENT_CLINIC_OR_DEPARTMENT_OTHER): Payer: Medicare Other

## 2016-07-04 ENCOUNTER — Ambulatory Visit (HOSPITAL_BASED_OUTPATIENT_CLINIC_OR_DEPARTMENT_OTHER): Payer: Medicare Other | Admitting: Family

## 2016-07-04 VITALS — BP 150/63 | HR 63 | Temp 98.0°F | Resp 18 | Wt 199.0 lb

## 2016-07-04 DIAGNOSIS — C50212 Malignant neoplasm of upper-inner quadrant of left female breast: Secondary | ICD-10-CM

## 2016-07-04 DIAGNOSIS — Z85528 Personal history of other malignant neoplasm of kidney: Secondary | ICD-10-CM | POA: Diagnosis not present

## 2016-07-04 DIAGNOSIS — Z5111 Encounter for antineoplastic chemotherapy: Secondary | ICD-10-CM | POA: Diagnosis not present

## 2016-07-04 DIAGNOSIS — Z170 Estrogen receptor positive status [ER+]: Principal | ICD-10-CM

## 2016-07-04 DIAGNOSIS — D509 Iron deficiency anemia, unspecified: Secondary | ICD-10-CM

## 2016-07-04 DIAGNOSIS — Z853 Personal history of malignant neoplasm of breast: Secondary | ICD-10-CM | POA: Diagnosis not present

## 2016-07-04 DIAGNOSIS — C50912 Malignant neoplasm of unspecified site of left female breast: Secondary | ICD-10-CM

## 2016-07-04 DIAGNOSIS — D500 Iron deficiency anemia secondary to blood loss (chronic): Secondary | ICD-10-CM

## 2016-07-04 LAB — CBC WITH DIFFERENTIAL (CANCER CENTER ONLY)
BASO#: 0.2 10*3/uL (ref 0.0–0.2)
BASO%: 1.9 % (ref 0.0–2.0)
EOS ABS: 0 10*3/uL (ref 0.0–0.5)
EOS%: 0.5 % (ref 0.0–7.0)
HCT: 32.4 % — ABNORMAL LOW (ref 34.8–46.6)
HGB: 10.9 g/dL — ABNORMAL LOW (ref 11.6–15.9)
LYMPH#: 0.4 10*3/uL — ABNORMAL LOW (ref 0.9–3.3)
LYMPH%: 4.4 % — AB (ref 14.0–48.0)
MCH: 31.3 pg (ref 26.0–34.0)
MCHC: 33.6 g/dL (ref 32.0–36.0)
MCV: 93 fL (ref 81–101)
MONO#: 0.7 10*3/uL (ref 0.1–0.9)
MONO%: 8.3 % (ref 0.0–13.0)
NEUT#: 7.1 10*3/uL — ABNORMAL HIGH (ref 1.5–6.5)
NEUT%: 84.9 % — AB (ref 39.6–80.0)
PLATELETS: 210 10*3/uL (ref 145–400)
RBC: 3.48 10*6/uL — ABNORMAL LOW (ref 3.70–5.32)
RDW: 18.1 % — AB (ref 11.1–15.7)
WBC: 8.3 10*3/uL (ref 3.9–10.0)

## 2016-07-04 LAB — CMP (CANCER CENTER ONLY)
ALT(SGPT): 24 U/L (ref 10–47)
AST: 20 U/L (ref 11–38)
Albumin: 3 g/dL — ABNORMAL LOW (ref 3.3–5.5)
Alkaline Phosphatase: 101 U/L — ABNORMAL HIGH (ref 26–84)
BUN: 36 mg/dL — AB (ref 7–22)
CHLORIDE: 105 meq/L (ref 98–108)
CO2: 30 meq/L (ref 18–33)
Calcium: 9 mg/dL (ref 8.0–10.3)
Creat: 1.5 mg/dl — ABNORMAL HIGH (ref 0.6–1.2)
GLUCOSE: 143 mg/dL — AB (ref 73–118)
POTASSIUM: 4 meq/L (ref 3.3–4.7)
Sodium: 139 mEq/L (ref 128–145)
Total Bilirubin: 0.7 mg/dl (ref 0.20–1.60)
Total Protein: 5.8 g/dL — ABNORMAL LOW (ref 6.4–8.1)

## 2016-07-04 LAB — ALDOSTERONE + RENIN ACTIVITY W/ RATIO: ALDO / PRA Ratio: UNDETERMINED

## 2016-07-04 MED ORDER — SODIUM CHLORIDE 0.9% FLUSH
10.0000 mL | INTRAVENOUS | Status: DC | PRN
  Administered 2016-07-04: 10 mL
  Filled 2016-07-04: qty 10

## 2016-07-04 MED ORDER — PALONOSETRON HCL INJECTION 0.25 MG/5ML
INTRAVENOUS | Status: AC
  Filled 2016-07-04: qty 5

## 2016-07-04 MED ORDER — PEGFILGRASTIM 6 MG/0.6ML ~~LOC~~ PSKT: 6.0000 mg | PREFILLED_SYRINGE | Freq: Once | SUBCUTANEOUS | Status: DC

## 2016-07-04 MED ORDER — DOXORUBICIN HCL CHEMO IV INJECTION 2 MG/ML
38.4000 mg/m2 | Freq: Once | INTRAVENOUS | Status: AC
  Administered 2016-07-04: 78 mg via INTRAVENOUS
  Filled 2016-07-04: qty 39

## 2016-07-04 MED ORDER — SODIUM CHLORIDE 0.9 % IV SOLN
Freq: Once | INTRAVENOUS | Status: AC
  Administered 2016-07-04: 11:00:00 via INTRAVENOUS

## 2016-07-04 MED ORDER — PEGFILGRASTIM 6 MG/0.6ML ~~LOC~~ PSKT
PREFILLED_SYRINGE | SUBCUTANEOUS | Status: AC
  Filled 2016-07-04: qty 0.6

## 2016-07-04 MED ORDER — HEPARIN SOD (PORK) LOCK FLUSH 100 UNIT/ML IV SOLN
500.0000 [IU] | Freq: Once | INTRAVENOUS | Status: AC | PRN
  Administered 2016-07-04: 500 [IU]
  Filled 2016-07-04: qty 5

## 2016-07-04 MED ORDER — FOSAPREPITANT DIMEGLUMINE INJECTION 150 MG
Freq: Once | INTRAVENOUS | Status: AC
  Administered 2016-07-04: 12:00:00 via INTRAVENOUS
  Filled 2016-07-04: qty 5

## 2016-07-04 MED ORDER — PALONOSETRON HCL INJECTION 0.25 MG/5ML
0.2500 mg | Freq: Once | INTRAVENOUS | Status: AC
  Administered 2016-07-04: 0.25 mg via INTRAVENOUS

## 2016-07-04 MED ORDER — SODIUM CHLORIDE 0.9 % IV SOLN
480.0000 mg/m2 | Freq: Once | INTRAVENOUS | Status: AC
  Administered 2016-07-04: 980 mg via INTRAVENOUS
  Filled 2016-07-04: qty 49

## 2016-07-04 NOTE — Progress Notes (Signed)
Hematology and Oncology Follow Up Visit  Cassidy Ortiz [id number] 1941/07/15 75 y.o. 07/04/2016   Principle Diagnosis:  1. 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 2. 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 3. Intermittent iron-deficiency anemia. 4.  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 She is feeling fatigued. She was hospitalized last week with uncontrolled HTN, dizziness, n/v/d, generalized pain and immunosuppression.  She followed up with cardiology, nephrology and her PCP yesterday and had some adjustments made to her medication regimen.  Manual BP at this time is 146/60, HR 63. Her WBC count is 8.3 with Hgb of 10.9.  She has taken imodium as needed for diarrhea relief. She uses her ativan, zofran and compazine as needed for nausea. She waits until she is nauseated to take her medication. I suggested she start the day before she normally starts becoming nauseated (day 6) to already have it in her system and see if this helps.  She had a radioactive seed guided partial mastectomy with axillary sentinel lymph node biopsy on 04/19/16. Her lumpectomy sight at the 9 o'clock position of the left breast has healed nicely. No changes with the right breast. No mass lesion, rash or lymphadenopathy found on exam.  No episodes of bleeding, bruising or petechiae.  No fever, chills, cough, rash, SOB, chest pain, palpitations, abdominal pain or changes in bladder habits. She is making urine.  No swelling, numbness or tingling in her extremities.  She has maintained a good appetite and is staying hydrated as much as possible. Her weight is stable.      Medications:    Medication List       Accurate as of 07/04/16  9:45 AM. Always use your most recent med list.            acetaminophen 325 MG tablet Commonly known as:  TYLENOL Take 2 tablets (650 mg total) by mouth every 6 (six) hours as needed for mild pain (or Fever >/= 101).   ALPRAZolam 0.25 MG tablet Commonly known as:  XANAX Take 1 tablet (0.25 mg total) by mouth 3 (three) times daily as needed for anxiety.   aspirin 81 MG tablet Take 162 mg by mouth daily at 12 noon. Take at noon.   atorvastatin 40 MG tablet Commonly known as:  LIPITOR TAKE 1 TABLET DAILY   buPROPion 100 MG 12 hr tablet Commonly known as:  WELLBUTRIN SR Take 100 mg by mouth 2 (two) times daily.   carvedilol 12.5 MG tablet Commonly known as:  COREG Take 1 tablet (12.5 mg total) by mouth 2 (two) times daily with a meal.   cloNIDine 0.1 MG tablet Commonly known as:  CATAPRES Take 1 tablet (0.1 mg total) by mouth 3 (three) times daily.   dexamethasone 4 MG tablet Commonly known as:  DECADRON Take 2 tablets by mouth once a day on the day after chemotherapy and then take 2 tablets two times a day for 2 days. Take with food.   docusate sodium 100 MG capsule Commonly known as:  COLACE Take 1 tablet once or twice daily as needed for constipation while taking narcotic pain medicine   doxazosin 4 MG tablet Commonly known as:  CARDURA Take 1 tablet (4 mg total)  by mouth daily.   esomeprazole 20 MG capsule Commonly known as:  NEXIUM Take 20 mg by mouth every morning.   hydrALAZINE 100 MG tablet Commonly known as:  APRESOLINE Take 1 tablet (100 mg total) by mouth 3 (three) times daily.   levothyroxine 175 MCG tablet Commonly known as:  SYNTHROID Take 1 tablet (175 mcg total) by mouth daily.   lidocaine 5 % Commonly known as:  LIDODERM Place 1 patch onto the skin daily. Leave for 12hr   lidocaine-prilocaine cream Commonly known as:  EMLA Apply to affected area as needed   lisinopril 10 MG tablet Commonly known as:  PRINIVIL,ZESTRIL Take 1 tablet (10 mg total) by mouth daily.   LORazepam 0.5 MG tablet Commonly  known as:  ATIVAN Take 1 tablet (0.5 mg total) by mouth every 6 (six) hours as needed (Nausea or vomiting).   mirtazapine 15 MG disintegrating tablet Commonly known as:  REMERON SOL-TAB Take 1 tablet (15 mg total) by mouth at bedtime.   multivitamin tablet Take 1 tablet by mouth daily.   nitroGLYCERIN 0.4 MG SL tablet Commonly known as:  NITROSTAT Place 1 tablet (0.4 mg total) under the tongue every 5 (five) minutes as needed for chest pain.   ondansetron 4 MG tablet Commonly known as:  ZOFRAN Take 1-2 tabs by mouth every 8 hours as needed for nausea/vomiting   oxyCODONE-acetaminophen 5-325 MG tablet Commonly known as:  ROXICET Take 1-2 tablets by mouth every 4 (four) hours as needed for severe pain.   prochlorperazine 10 MG tablet Commonly known as:  COMPAZINE Take 1 tablet (10 mg total) by mouth every 6 (six) hours as needed (Nausea or vomiting).   rOPINIRole 1 MG tablet Commonly known as:  REQUIP TAKE 1 TABLET (1 MG TOTAL) BY MOUTH DAILY AS NEEDED.   TOVIAZ 4 MG Tb24 tablet Generic drug:  fesoterodine Take 4 mg by mouth every morning.   traZODone 100 MG tablet Commonly known as:  DESYREL Take 100 mg by mouth every evening.       Allergies:  Allergies  Allergen Reactions  . Amlodipine     Leg swelling    Past Medical History, Surgical history, Social history, and Family History were reviewed and updated.  Review of Systems: All other 10 point review of systems is negative.   Physical Exam:  weight is 199 lb (90.3 kg). Her oral temperature is 98 F (36.7 C). Her blood pressure is 150/63 (abnormal) and her pulse is 63. Her respiration is 18.   Wt Readings from Last 3 Encounters:  07/04/16 199 lb (90.3 kg)  07/03/16 201 lb (91.2 kg)  07/03/16 199 lb (90.3 kg)    Ocular: Sclerae unicteric, pupils equal, round and reactive to light Ear-nose-throat: Oropharynx clear, dentition fair Lymphatic: No cervical supraclavicular or axillary adenopathy Lungs no  rales or rhonchi, good excursion bilaterally Heart regular rate and rhythm, no murmur appreciated Abd soft, nontender, positive bowel sounds, no liver or spleen tip palpated on exam, no fluid wave.  MSK no focal spinal tenderness, no joint edema Neuro: non-focal, well-oriented, appropriate affect Breasts: lumpectomy sight at the 9 o'clock position on the left breast has healed nicely. No changes to the right breast. No new mass, lesion or rash.   Lab Results  Component Value Date   WBC 16.2 (H) 06/28/2016   HGB 10.3 (L) 06/28/2016   HCT 30.4 (L) 06/28/2016   MCV 90.3 06/28/2016   PLT 108 (L) 06/28/2016   Lab Results  Component Value  Date   FERRITIN 53 04/08/2016   IRON 57 04/08/2016   TIBC 219 (L) 04/08/2016   UIBC 162 04/08/2016   IRONPCTSAT 26 04/08/2016   Lab Results  Component Value Date   RBC 3.37 (L) 06/28/2016   No results found for: KPAFRELGTCHN, LAMBDASER, KAPLAMBRATIO No results found for: IGGSERUM, IGA, IGMSERUM No results found for: Kathrynn Ducking, MSPIKE, SPEI   Chemistry      Component Value Date/Time   NA 141 06/28/2016 0521   NA 140 06/13/2016 0827   NA 144 05/16/2016 0901   K 4.4 06/28/2016 0521   K 4.1 06/13/2016 0827   K 4.3 05/16/2016 0901   CL 106 06/28/2016 0521   CL 105 06/13/2016 0827   CO2 29 06/28/2016 0521   CO2 28 06/13/2016 0827   CO2 25 05/16/2016 0901   BUN 34 (H) 06/28/2016 0521   BUN 28 (H) 06/13/2016 0827   BUN 31.8 (H) 05/16/2016 0901   CREATININE 1.29 (H) 06/28/2016 0521   CREATININE 1.5 (H) 06/13/2016 0827   CREATININE 1.4 (H) 05/16/2016 0901      Component Value Date/Time   CALCIUM 8.8 (L) 06/28/2016 0521   CALCIUM 9.5 06/13/2016 0827   CALCIUM 9.7 05/16/2016 0901   ALKPHOS 120 06/22/2016 0254   ALKPHOS 103 (H) 06/13/2016 0827   ALKPHOS 86 05/16/2016 0901   AST 15 06/22/2016 0254   AST 20 06/13/2016 0827   AST 17 05/16/2016 0901   ALT 12 (L) 06/22/2016 0254   ALT 20  06/13/2016 0827   ALT 16 05/16/2016 0901   BILITOT 0.3 06/22/2016 0254   BILITOT 0.50 06/13/2016 0827   BILITOT 0.37 05/16/2016 0901     Impression and Plan: Ms. Klang is 75 year old female with iron deficiency anemia. She also has a remote history of stage I ductal carcinoma of the right breast and of right renal cell carcinoma. She now has a new invasive ductal carcinoma of the left breast with high Oncotype score. She has had 2 cycles of chemotherapy so far and has had some difficulty with this. She has experienced n/v/d and generalized aches and pains several days after treatment. She is feeling fatigued at this time. She was hospitalized last week with the above symptoms including uncontrolled hypertension. She saw cardiology and her PCP yesterday and had some adjustments made to her antihypertensive meds. She also saw nephrology yesterday. Her BUN is 36 with a creatinine of 1.5.   Dr. Marin Olp will adjust her treatment dose and hold Neulasta. We will also plan to give her fluids next week to prevent dehydration.  She will take her antiemetics prophylacticly  starting on day 6.   She has her current treatment and appointment schedule. Dr. Marin Olp will see her back in 3 weeks for repeat labs, follow-up and treatment.  Both she and her husband know to contact our office with any questions or concerns. We can certainly see her sooner if need be.   Cassidy Bottom, NP 7/27/20179:45 AM

## 2016-07-04 NOTE — Patient Instructions (Signed)
[facility] Discharge Instructions for Patients Receiving Chemotherapy  Today you received the following chemotherapy agents Adriamycin and Cytoxan  To help prevent nausea and vomiting after your treatment, we encourage you to take your nausea medication   1) Zofran (Ondansetron) 8 mg twice a day for 3 days beginning  on Sunday for 3 days.  Take one in the morning and one in the evening to help prevent nausea   2) Decadron (Dexamethasone)  Take 2 tablets by mouth once a day beginning on Sunday for 3 days.  Take with food.  3) Compazine (Prochlorperazine) Take 10 mg every 6 hours as needed for nausea.  4) Ativan  (Lorazepam) Take 1 tablet every 6 hours as needed for nausea and vomiting.  If you develop nausea and vomiting that is not controlled by your nausea medication, call the clinic. If it is after clinic hours your family physician or the after hours number for the clinic or go to the Emergency Department.   BELOW ARE SYMPTOMS THAT SHOULD BE REPORTED IMMEDIATELY:  *FEVER GREATER THAN 100.5 F  *CHILLS WITH OR WITHOUT FEVER  NAUSEA AND VOMITING THAT IS NOT CONTROLLED WITH YOUR NAUSEA MEDICATION  *UNUSUAL SHORTNESS OF BREATH  *UNUSUAL BRUISING OR BLEEDING  TENDERNESS IN MOUTH AND THROAT WITH OR WITHOUT PRESENCE OF ULCERS  *URINARY PROBLEMS  *BOWEL PROBLEMS  UNUSUAL RASH Items with * indicate a potential emergency and should be followed up as soon as possible.  One of the nurses will contact you 24 hours after your treatment. Please let the nurse know about any problems that you may have experienced. Feel free to call the clinic [phone]   I have been informed and understand all the instructions given to me. I know to contact the clinic, my physician, or go to the Emergency Department if any problems should occur. I do not have any questions at this time, but understand that I may call the clinic during office hours   should I have any questions or need  assistance in obtaining follow up care.    __________________________________________  _____________  __________ Signature of Patient or Authorized Representative            Date                   Time    __________________________________________ Nurse's Signature

## 2016-07-10 ENCOUNTER — Ambulatory Visit (HOSPITAL_BASED_OUTPATIENT_CLINIC_OR_DEPARTMENT_OTHER): Payer: Medicare Other

## 2016-07-10 VITALS — BP 104/49 | HR 55 | Temp 98.2°F | Resp 20

## 2016-07-10 DIAGNOSIS — Z853 Personal history of malignant neoplasm of breast: Secondary | ICD-10-CM | POA: Diagnosis not present

## 2016-07-10 DIAGNOSIS — C50912 Malignant neoplasm of unspecified site of left female breast: Secondary | ICD-10-CM

## 2016-07-10 DIAGNOSIS — Z170 Estrogen receptor positive status [ER+]: Principal | ICD-10-CM

## 2016-07-10 MED ORDER — SODIUM CHLORIDE 0.9 % IV SOLN
Freq: Once | INTRAVENOUS | Status: AC
  Administered 2016-07-10: 13:00:00 via INTRAVENOUS

## 2016-07-10 MED ORDER — SODIUM CHLORIDE 0.9% FLUSH
10.0000 mL | INTRAVENOUS | Status: DC | PRN
  Administered 2016-07-10: 10 mL via INTRAVENOUS
  Filled 2016-07-10: qty 10

## 2016-07-10 MED ORDER — HEPARIN SOD (PORK) LOCK FLUSH 100 UNIT/ML IV SOLN
500.0000 [IU] | Freq: Once | INTRAVENOUS | Status: AC
  Administered 2016-07-10: 500 [IU] via INTRAVENOUS
  Filled 2016-07-10: qty 5

## 2016-07-10 NOTE — Progress Notes (Signed)
Pt here today for IVfs, no complaint of nausea. Does have poor appetite.  States is not sleeping well. BP low, Dr. Marin Olp notified, changes made to some BP meds. Gave patient's husband med list with directions on new doses and discontinued meds. Disc. Cardura and Lisinopril.  Decrease Apresoline to 50 mg three times a day.

## 2016-07-10 NOTE — Patient Instructions (Signed)

## 2016-07-11 ENCOUNTER — Ambulatory Visit (INDEPENDENT_AMBULATORY_CARE_PROVIDER_SITE_OTHER): Payer: Medicare Other | Admitting: Family Medicine

## 2016-07-11 ENCOUNTER — Telehealth: Payer: Self-pay | Admitting: Internal Medicine

## 2016-07-11 ENCOUNTER — Encounter: Payer: Self-pay | Admitting: Family Medicine

## 2016-07-11 ENCOUNTER — Telehealth: Payer: Self-pay | Admitting: *Deleted

## 2016-07-11 DIAGNOSIS — I160 Hypertensive urgency: Secondary | ICD-10-CM | POA: Diagnosis not present

## 2016-07-11 NOTE — Telephone Encounter (Signed)
FYI Pt was transferred to Nurse Line she had a elevated BP of 205/100.

## 2016-07-11 NOTE — Progress Notes (Signed)
Subjective:  Patient ID: Cassidy Ortiz, female    DOB: [birth year]  Age: 75 y.o. MRN: [id number]  CC: Elevated/labile BP  HPI:  75 year old female with a complicated past medical history who is now being treated for breast cancer presents with complaints of elevated blood pressure.  Patient has been struggling with her blood pressure since earlier this month. She was previously hospitalized for hypertensive emergency. After hospital visit she was seen by her PCP and cardiology and was doing well from a hypertension standpoint. However, since that time blood pressures have been labile and elevated severely at times. She was recently seen by her oncologist. She went to oncology for IV fluid yesterday. Her blood pressures have been running low. The oncologist stopped her lisinopril and her Cardura and decreased the dose of her hydralazine. She presents today with complaints of severely elevated blood pressure. She is very concerned and upset about her blood pressures. She's not sure why they keep going up and down. She is anxious about this and is very fearful. She just doesn't understand what the culprit is. Patient is unsure of all of her medications at this time. No complaints of chest pain, shortness of breath, pain. She is just concerned about her blood pressure. No other complaints at this time.  Social Hx   Social History   Social History  . Marital status: Married    Spouse name: N/A  . Number of children: 3  . Years of education: N/A   Occupational History  . retired    Social History Main Topics  . Smoking status: Former Smoker    Packs/day: 1.00    Years: 43.00    Types: Cigarettes    Start date: 04/01/1964    Quit date: 11/24/2007  . Smokeless tobacco: Never Used     Comment: quit smoking 7 years ago  . Alcohol use 0.6 oz/week    1 Glasses of wine per week     Comment: occasssionally  . Drug use: No  . Sexual activity: Not Currently   Other Topics Concern  . None    Social History Narrative   Lives in [place] with husband. From [place]. Children live [place] and [place].   Pets - 1 cat in home.      Work - retired Theme park manager, Educational psychologist   Diet - regular, limited calories         Review of Systems  Constitutional: Positive for fatigue.  Respiratory: Negative.   Cardiovascular: Negative.    Objective:  BP (!) 186/105 (BP Location: Right Arm, Patient Position: Sitting, Cuff Size: Normal)   Pulse 74   Temp 98.3 F (36.8 C) (Oral)   Wt 194 lb 8 oz (88.2 kg)   SpO2 93%   BMI 30.46 kg/m   BP/Weight 07/11/2016 07/10/2016 [id number]  Systolic BP [id number] [id number]  Diastolic BP 123456 49 63  Wt. (Lbs) 194.5 - 199  BMI 30.46 - 31.17   Physical Exam  Constitutional:  Appears fatigued. Chronically ill appearing. No acute distress.  Cardiovascular: Normal rate and regular rhythm.   2/6 systolic murmur.  Pulmonary/Chest: Effort normal. She has no wheezes. She has no rales.  Neurological: She is alert.  Psychiatric:  Flat affect, depressed mood.  Vitals reviewed.  Lab Results  Component Value Date   WBC 8.3 07/04/2016   HGB 10.9 (L) 07/04/2016   HCT 32.4 (L) 07/04/2016   PLT 210 07/04/2016   GLUCOSE 143 (H) 07/04/2016   CHOL 150 07/13/2014   TRIG 114.0  07/13/2014   HDL 58.20 07/13/2014   LDLCALC 69 07/13/2014   ALT 24 07/04/2016   AST 20 07/04/2016   NA 139 07/04/2016   K 4.0 07/04/2016   CL 105 07/04/2016   CREATININE 1.5 (H) 07/04/2016   BUN 36 (H) 07/04/2016   CO2 30 07/04/2016   TSH 0.74 05/11/2015   INR 1.03 05/22/2016    Assessment & Plan:   Problem List Items Addressed This Visit    Hypertensive urgency    Recurrent issue as of late. Recent labs reviewed. Notable for Creatinine of 1.5. Discussed her case with her cardiologist as well as her primary care physician. Patient's husband reconciled her medications for me. After discussion with the cardiologist, I am increasing her dose of hydralazine. Gave explicit instructions about adding back  medications if blood pressure remains greater than 150/90. Advised her to hydrate aggressively as I think this plays a role. PRN Xanax for anxiety as this is also playing a role.       Other Visit Diagnoses   None.    Follow-up: PRN  [place]

## 2016-07-11 NOTE — Assessment & Plan Note (Addendum)
Recurrent issue as of late. Recent labs reviewed. Notable for Creatinine of 1.5. Discussed her case with her cardiologist as well as her primary care physician. Patient's husband reconciled her medications for me. After discussion with the cardiologist, I am increasing her dose of hydralazine. Gave explicit instructions about adding back medications if blood pressure remains greater than 150/90. Advised her to hydrate aggressively as I think this plays a role. PRN Xanax for anxiety as this is also playing a role.

## 2016-07-11 NOTE — Telephone Encounter (Signed)
Confirmed pt is being seen.

## 2016-07-11 NOTE — Telephone Encounter (Signed)
Patient Name: Cassidy Ortiz  DOB: 12-29-1940    Initial Comment Caller states bp is 205/100, is on chemo.    Nurse Assessment  Nurse: Mallie Mussel, RN, Alveta Heimlich Date/Time Eilene Ghazi Time): 07/11/2016 10:16:09 AM  Confirm and document reason for call. If symptomatic, describe symptoms. You must click the next button to save text entered. ---Caller states that her BP was 205/100 about 10 minutes ago. She is on chemo. Denies headache. She denies unilateral weakness and numbness.  Has the patient traveled out of the country within the last 30 days? ---No  Does the patient have any new or worsening symptoms? ---Yes  Will a triage be completed? ---Yes  Related visit to physician within the last 2 weeks? ---No  Does the PT have any chronic conditions? (i.e. diabetes, asthma, etc.) ---Yes  List chronic conditions. ---Breast CA, HTN  Is this a behavioral health or substance abuse call? ---No     Guidelines    Guideline Title Affirmed Question Affirmed Notes  High Blood Pressure BP ? 160/100    Final Disposition User   See PCP When Office is Open (within 3 days) Mallie Mussel, RN, Alveta Heimlich    Comments  Dr. Gilford Rile did not have an appointment available. Dr. Lacinda Axon had one at 1030. She states that she is only 5 minutes away and will take it. Appointment scheduled.   Disagree/Comply: Comply

## 2016-07-11 NOTE — Progress Notes (Signed)
Pre visit review using our clinic review tool, if applicable. No additional management support is needed unless otherwise documented below in the visit note. 

## 2016-07-11 NOTE — Telephone Encounter (Signed)
This is your 1030, thanks

## 2016-07-11 NOTE — Telephone Encounter (Signed)
Patient being seen by Dr. Lacinda Axon

## 2016-07-11 NOTE — Patient Instructions (Signed)
Continue the Clonidine and Coreg as prescribed.  Increase the Hydralazine to 100 mg three times daily. Consider having her taking the Xanax regularly for anxiety.  If her BP continues to be above 150/90, add back the Lisinopril 10 mg.  If it still elevated added back the Cardura.  Check 2-3 times daily.  Follow up closely; Call with concerns  Dr. Lacinda Axon

## 2016-07-22 DIAGNOSIS — Z9889 Other specified postprocedural states: Secondary | ICD-10-CM | POA: Diagnosis not present

## 2016-07-22 DIAGNOSIS — Z8679 Personal history of other diseases of the circulatory system: Secondary | ICD-10-CM | POA: Diagnosis not present

## 2016-07-22 DIAGNOSIS — I671 Cerebral aneurysm, nonruptured: Secondary | ICD-10-CM | POA: Diagnosis not present

## 2016-07-23 ENCOUNTER — Telehealth: Payer: Self-pay | Admitting: *Deleted

## 2016-07-23 ENCOUNTER — Other Ambulatory Visit: Payer: Self-pay | Admitting: Internal Medicine

## 2016-07-23 DIAGNOSIS — C50912 Malignant neoplasm of unspecified site of left female breast: Secondary | ICD-10-CM

## 2016-07-23 DIAGNOSIS — Z170 Estrogen receptor positive status [ER+]: Principal | ICD-10-CM

## 2016-07-23 NOTE — Telephone Encounter (Signed)
Dr.Walker las saw her 07/03/16 and DR.Lacinda Axon saw her 07/11/16. Trazodone historical medication and mirtazapine (REMERON SOL-TAB) 15 MG disintegrating tablet refilled 07/03/16 with 6 refills under Dr.Walker. Please advise?

## 2016-07-23 NOTE — Telephone Encounter (Signed)
Pt will need a medication refill trazodone, and mirtazapine  Pharmacy [facility]  Pt will est with Dr. Caryl Bis once her Chemo appts .are completed.

## 2016-07-23 NOTE — Telephone Encounter (Signed)
Refilled request sent to Dr.Sonnenberg.

## 2016-07-24 MED ORDER — TRAZODONE HCL 100 MG PO TABS: 100.0000 mg | ORAL_TABLET | Freq: Every evening | ORAL | 0 refills | Status: DC

## 2016-07-24 MED ORDER — MIRTAZAPINE 15 MG PO TBDP: 15.0000 mg | ORAL_TABLET | Freq: Every day | ORAL | 6 refills | Status: DC

## 2016-07-24 NOTE — Telephone Encounter (Signed)
Refill sent to pharmacy.   

## 2016-07-25 ENCOUNTER — Encounter: Payer: Self-pay | Admitting: Hematology & Oncology

## 2016-07-25 ENCOUNTER — Ambulatory Visit (HOSPITAL_BASED_OUTPATIENT_CLINIC_OR_DEPARTMENT_OTHER): Payer: Medicare Other | Admitting: Hematology & Oncology

## 2016-07-25 ENCOUNTER — Ambulatory Visit (HOSPITAL_BASED_OUTPATIENT_CLINIC_OR_DEPARTMENT_OTHER): Payer: Medicare Other

## 2016-07-25 ENCOUNTER — Other Ambulatory Visit (HOSPITAL_BASED_OUTPATIENT_CLINIC_OR_DEPARTMENT_OTHER): Payer: Medicare Other

## 2016-07-25 VITALS — BP 180/76 | HR 62 | Resp 16

## 2016-07-25 DIAGNOSIS — C50611 Malignant neoplasm of axillary tail of right female breast: Secondary | ICD-10-CM

## 2016-07-25 DIAGNOSIS — C50912 Malignant neoplasm of unspecified site of left female breast: Secondary | ICD-10-CM | POA: Diagnosis not present

## 2016-07-25 DIAGNOSIS — Z85528 Personal history of other malignant neoplasm of kidney: Secondary | ICD-10-CM

## 2016-07-25 DIAGNOSIS — Z5111 Encounter for antineoplastic chemotherapy: Secondary | ICD-10-CM

## 2016-07-25 DIAGNOSIS — Z853 Personal history of malignant neoplasm of breast: Secondary | ICD-10-CM | POA: Diagnosis not present

## 2016-07-25 DIAGNOSIS — Z170 Estrogen receptor positive status [ER+]: Secondary | ICD-10-CM | POA: Diagnosis not present

## 2016-07-25 DIAGNOSIS — D500 Iron deficiency anemia secondary to blood loss (chronic): Secondary | ICD-10-CM

## 2016-07-25 DIAGNOSIS — C50212 Malignant neoplasm of upper-inner quadrant of left female breast: Secondary | ICD-10-CM

## 2016-07-25 LAB — CMP (CANCER CENTER ONLY)
ALK PHOS: 63 U/L (ref 26–84)
ALT: 17 U/L (ref 10–47)
AST: 20 U/L (ref 11–38)
Albumin: 3.2 g/dL — ABNORMAL LOW (ref 3.3–5.5)
BUN: 22 mg/dL (ref 7–22)
CO2: 27 mEq/L (ref 18–33)
CREATININE: 1.6 mg/dL — AB (ref 0.6–1.2)
Calcium: 9 mg/dL (ref 8.0–10.3)
Chloride: 107 mEq/L (ref 98–108)
GLUCOSE: 155 mg/dL — AB (ref 73–118)
Potassium: 3.6 mEq/L (ref 3.3–4.7)
SODIUM: 139 meq/L (ref 128–145)
TOTAL PROTEIN: 6 g/dL — AB (ref 6.4–8.1)
Total Bilirubin: 0.6 mg/dl (ref 0.20–1.60)

## 2016-07-25 LAB — CBC WITH DIFFERENTIAL (CANCER CENTER ONLY)
BASO#: 0 10*3/uL (ref 0.0–0.2)
BASO%: 1.2 % (ref 0.0–2.0)
EOS%: 2.7 % (ref 0.0–7.0)
Eosinophils Absolute: 0.1 10*3/uL (ref 0.0–0.5)
HCT: 34.3 % — ABNORMAL LOW (ref 34.8–46.6)
HEMOGLOBIN: 11.7 g/dL (ref 11.6–15.9)
LYMPH#: 0.4 10*3/uL — ABNORMAL LOW (ref 0.9–3.3)
LYMPH%: 12.3 % — AB (ref 14.0–48.0)
MCH: 31.7 pg (ref 26.0–34.0)
MCHC: 34.1 g/dL (ref 32.0–36.0)
MCV: 93 fL (ref 81–101)
MONO#: 0.6 10*3/uL (ref 0.1–0.9)
MONO%: 16.6 % — AB (ref 0.0–13.0)
NEUT%: 67.2 % (ref 39.6–80.0)
NEUTROS ABS: 2.2 10*3/uL (ref 1.5–6.5)
PLATELETS: 151 10*3/uL (ref 145–400)
RBC: 3.69 10*6/uL — ABNORMAL LOW (ref 3.70–5.32)
RDW: 17.3 % — AB (ref 11.1–15.7)
WBC: 3.3 10*3/uL — ABNORMAL LOW (ref 3.9–10.0)

## 2016-07-25 MED ORDER — SODIUM CHLORIDE 0.9 % IV SOLN
480.0000 mg/m2 | Freq: Once | INTRAVENOUS | Status: AC
  Administered 2016-07-25: 980 mg via INTRAVENOUS
  Filled 2016-07-25: qty 49

## 2016-07-25 MED ORDER — SODIUM CHLORIDE 0.9% FLUSH
10.0000 mL | INTRAVENOUS | Status: DC | PRN
  Administered 2016-07-25: 10 mL
  Filled 2016-07-25: qty 10

## 2016-07-25 MED ORDER — DOXORUBICIN HCL CHEMO IV INJECTION 2 MG/ML
38.4000 mg/m2 | Freq: Once | INTRAVENOUS | Status: AC
  Administered 2016-07-25: 78 mg via INTRAVENOUS
  Filled 2016-07-25: qty 39

## 2016-07-25 MED ORDER — SODIUM CHLORIDE 0.9 % IV SOLN
Freq: Once | INTRAVENOUS | Status: AC
  Administered 2016-07-25: 11:00:00 via INTRAVENOUS

## 2016-07-25 MED ORDER — PALONOSETRON HCL INJECTION 0.25 MG/5ML
INTRAVENOUS | Status: AC
  Filled 2016-07-25: qty 5

## 2016-07-25 MED ORDER — PALONOSETRON HCL INJECTION 0.25 MG/5ML
0.2500 mg | Freq: Once | INTRAVENOUS | Status: AC
  Administered 2016-07-25: 0.25 mg via INTRAVENOUS

## 2016-07-25 MED ORDER — HEPARIN SOD (PORK) LOCK FLUSH 100 UNIT/ML IV SOLN
500.0000 [IU] | Freq: Once | INTRAVENOUS | Status: AC | PRN
  Administered 2016-07-25: 500 [IU]
  Filled 2016-07-25: qty 5

## 2016-07-25 MED ORDER — SODIUM CHLORIDE 0.9 % IV SOLN
Freq: Once | INTRAVENOUS | Status: AC
  Administered 2016-07-25: 11:00:00 via INTRAVENOUS
  Filled 2016-07-25: qty 5

## 2016-07-25 NOTE — Progress Notes (Signed)
Hematology and Oncology Follow Up Visit  Cassidy Ortiz [id number] 03-Jul-1941 75 y.o. 07/25/2016   Principle Diagnosis:  1. 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 2. 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 3. Intermittent iron-deficiency anemia. 4.  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  Ortiz is back for followup. Cassidy Ortiz sees be doing little better. Cassidy Ortiz was not hospitalized after the last cycle of treatment. I adjusted her dose of treatment a little bit.  Cassidy Ortiz does have issues with depression. I think Cassidy Ortiz's handling herself pretty well. Her husband has been very supportive and has been very helpful in trying to get her through treatments.  Cassidy Ortiz does not have much of an appetite. I might sure Cassidy Ortiz really has lost much weight to date.  Cassidy Ortiz does go to do because of some aneurysms with the brain. At think so far everything has been doing okay with these. They want to do a CT scan of the brain. I told Cassidy Ortiz that this would be okay from my point of view.  Cassidy Ortiz's had no cough. Cassidy Ortiz's had no diarrhea. Cassidy Ortiz's had no bleeding or bruising. Cassidy Ortiz has had no fever.  Overall, her performance status is ECOG 1.  Medications:  Current Outpatient Prescriptions:  .  ALPRAZolam (XANAX) 0.25 MG tablet, Take 1 tablet (0.25 mg total) by mouth 3 (three) times daily as needed for anxiety., Disp: 30 tablet, Rfl: 0 .  aspirin 81 MG tablet, Take 162 mg by mouth daily at 12 noon. Take at noon., Disp: , Rfl:  .  atorvastatin (LIPITOR) 40 MG tablet, TAKE 1 TABLET DAILY, Disp: 90 tablet, Rfl: 3 .  buPROPion (WELLBUTRIN SR) 100 MG 12 hr tablet, Take 100 mg by mouth 2 (two) times daily., Disp: , Rfl:  .  carvedilol (COREG) 12.5 MG tablet, Take 1 tablet (12.5 mg total) by mouth 2 (two) times daily with a meal., Disp: 90 tablet, Rfl: 3 .  cloNIDine (CATAPRES)  0.1 MG tablet, Take 1 tablet (0.1 mg total) by mouth 3 (three) times daily., Disp: 270 tablet, Rfl: 3 .  dexamethasone (DECADRON) 4 MG tablet, Take 4 mg by mouth 2 (two) times daily with a meal. TAKE TWO TABLETS BY MOUTH ONCE A DAY ON THE DAY AFTER CHEMOTHERAPY AND THEN TAKE 2 TABLETS TWO TIMES A DAY FOR 2 DAYS. TAKE WITH FOOD., Disp: , Rfl:  .  esomeprazole (NEXIUM) 20 MG capsule, Take 20 mg by mouth every morning., Disp: , Rfl:  .  hydrALAZINE (APRESOLINE) 100 MG tablet, Take 1 tablet (100 mg total) by mouth 3 (three) times daily. (Patient taking differently: Take 50 mg by mouth 3 (three) times daily. ), Disp: 270 tablet, Rfl: 3 .  levothyroxine (SYNTHROID) 175 MCG tablet, Take 1 tablet (175 mcg total) by mouth daily., Disp: 90 tablet, Rfl: 3 .  lidocaine-prilocaine (EMLA) cream, Apply to affected area as needed, Disp: 30 g, Rfl: 3 .  mirtazapine (REMERON SOL-TAB) 15 MG disintegrating tablet, Take 1 tablet (15 mg total) by mouth at bedtime., Disp: 30 tablet, Rfl: 6 .  Multiple Vitamin (MULTIVITAMIN) tablet, Take 1 tablet by mouth daily.  , Disp: , Rfl:  .  nitroGLYCERIN (NITROSTAT) 0.4 MG SL tablet, Place 1 tablet (0.4 mg total) under the tongue every 5 (five) minutes as  needed for chest pain., Disp: 25 tablet, Rfl: 3 .  rOPINIRole (REQUIP) 1 MG tablet, TAKE 1 TABLET (1 MG TOTAL) BY MOUTH DAILY AS NEEDED., Disp: 90 tablet, Rfl: 1 .  traZODone (DESYREL) 100 MG tablet, Take 1 tablet (100 mg total) by mouth every evening., Disp: 30 tablet, Rfl: 0 .  ondansetron (ZOFRAN) 4 MG tablet, Take 1-2 tabs by mouth every 8 hours as needed for nausea/vomiting (Patient not taking: Reported on 07/25/2016), Disp: 30 tablet, Rfl: 0 .  prochlorperazine (COMPAZINE) 10 MG tablet, Take 1 tablet (10 mg total) by mouth every 6 (six) hours as needed (Nausea or vomiting). (Patient not taking: Reported on 07/25/2016), Disp: 30 tablet, Rfl: 1 No current facility-administered medications for this visit.   Facility-Administered  Medications Ordered in Other Visits:  .  0.9 %  sodium chloride infusion, , Intravenous, Continuous, Volanda Napoleon, MD, Stopped at 04/20/15 1651 .  0.9 %  sodium chloride infusion, , Intravenous, Once, Volanda Napoleon, MD .  cyclophosphamide (CYTOXAN) 980 mg in sodium chloride 0.9 % 250 mL chemo infusion, 480 mg/m2 (Treatment Plan Recorded), Intravenous, Once, Volanda Napoleon, MD .  DOXOrubicin (ADRIAMYCIN) chemo injection 78 mg, 38.4 mg/m2 (Treatment Plan Recorded), Intravenous, Once, Volanda Napoleon, MD .  heparin lock flush 100 unit/mL, 500 Units, Intracatheter, Once PRN, Volanda Napoleon, MD .  palonosetron (ALOXI) injection 0.25 mg, 0.25 mg, Intravenous, Once, Volanda Napoleon, MD .  sodium chloride flush (NS) 0.9 % injection 10 mL, 10 mL, Intracatheter, PRN, Volanda Napoleon, MD  Allergies:  Allergies  Allergen Reactions  . Amlodipine     Leg swelling    Past Medical History, Surgical history, Social history, and Family History were reviewed and updated.  Review of Systems: As above  Physical Exam:  height is _0  (1.549 m) and weight is 197 lb (89.4 kg). Her temperature is 97.9 F (36.6 C). Her blood pressure is 111/59 (abnormal) and her pulse is 67. Her respiration is 18.   Well-developed and well-nourished white female. Head and neck exam shows no ocular or oral lesions. 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 Cassidy Ortiz has no palpable cervical or supraclavicular lymph nodes. Lungs are clear. Cardiac exam regular in rhythm with no murmurs rubs or bruits. Abdomen is soft. Cassidy Ortiz is good bowel sounds. There is no fluid wave. There is no guarding or rebound tenderness. There is no palpable liver or spleen tip.extremities shows the compression stockings bilaterally. Cassidy Ortiz has minimal edema in her legs. No erythema is noted. Cassidy Ortiz good range of motion and strength. Skin exam no rashes. Neurological exam is non-focal. Breast exam shows left breast  with a healing lumpectomy scar at about 11:00  position. There is some bruising noted. No distinct masses noted in the left breast. Cassidy Ortiz has the healing left axillary sentinel node biopsy scar. Right breast shows contraction of the tissue. Cassidy Ortiz has the nipple inversion which is chronic. Cassidy Ortiz has some radiation changes. Cassidy Ortiz has no obvious mass in the right breast. There is no right axillary adenopathy.   Lab Results  Component Value Date   WBC 3.3 (L) 07/25/2016   HGB 11.7 07/25/2016   HCT 34.3 (L) 07/25/2016   MCV 93 07/25/2016   PLT 151 07/25/2016     Chemistry      Component Value Date/Time   NA 139 07/25/2016 0914   NA 144 05/16/2016 0901   K 3.6 07/25/2016 0914   K 4.3 05/16/2016 0901   CL 107 07/25/2016 0914  CO2 27 07/25/2016 0914   CO2 25 05/16/2016 0901   BUN 22 07/25/2016 0914   BUN 31.8 (H) 05/16/2016 0901   CREATININE 1.6 (H) 07/25/2016 0914   CREATININE 1.4 (H) 05/16/2016 0901      Component Value Date/Time   CALCIUM 9.0 07/25/2016 0914   CALCIUM 9.7 05/16/2016 0901   ALKPHOS 63 07/25/2016 0914   ALKPHOS 86 05/16/2016 0901   AST 20 07/25/2016 0914   AST 17 05/16/2016 0901   ALT 17 07/25/2016 0914   ALT 16 05/16/2016 0901   BILITOT 0.60 07/25/2016 0914   BILITOT 0.37 05/16/2016 0901         Impression and Plan: Cassidy Ortiz is 75 year old female. Cassidy Ortiz has remote history of stage I ductal carcinoma of the right breast. Cassidy Ortiz has a past history of right renal cell carcinoma.  Cassidy Ortiz now has a new invasive ductal carcinoma of the left breast. Because of the high Oncotype score, we really need to embark upon chemotherapy.  This will be her fourth and final cycle of treatment.  We will then see about radiation therapy. Because of where they live, Cassidy Ortiz would like to have this at [facility]. I don't think this will be a problem. How we can make it easier for her recently shell.  I'm glad that Cassidy Ortiz was able to make it through treatment.    we will do the same thing that we did last treatment. I'll have her come in in one  week for some fluids and for some anti-emetics.  I'll then plan to get her back in one month. At that point, her blood counts should have improved and Cassidy Ortiz should be ready for radiation therapy.    Volanda Napoleon, MD 8/17/201711:19 AM

## 2016-07-25 NOTE — Progress Notes (Signed)
Ok to treat per Dr Marin Olp with Creatine of 1.6

## 2016-07-25 NOTE — Patient Instructions (Signed)
[facility] Discharge Instructions for Patients Receiving Chemotherapy  Today you received the following chemotherapy agents Adriamycin and Cytoxan.   To help prevent nausea and vomiting after your treatment, we encourage you to take your nausea medication.   If you develop nausea and vomiting that is not controlled by your nausea medication, call the clinic.   BELOW ARE SYMPTOMS THAT SHOULD BE REPORTED IMMEDIATELY:  *FEVER GREATER THAN 100.5 F  *CHILLS WITH OR WITHOUT FEVER  NAUSEA AND VOMITING THAT IS NOT CONTROLLED WITH YOUR NAUSEA MEDICATION  *UNUSUAL SHORTNESS OF BREATH  *UNUSUAL BRUISING OR BLEEDING  TENDERNESS IN MOUTH AND THROAT WITH OR WITHOUT PRESENCE OF ULCERS  *URINARY PROBLEMS  *BOWEL PROBLEMS  UNUSUAL RASH Items with * indicate a potential emergency and should be followed up as soon as possible.  Feel free to call the clinic you have any questions or concerns. The clinic phone number is [phone].  Please show the CHEMO ALERT CARD at check-in to the Emergency Department and triage nurse.   

## 2016-07-26 ENCOUNTER — Other Ambulatory Visit: Payer: Self-pay

## 2016-07-26 DIAGNOSIS — C50912 Malignant neoplasm of unspecified site of left female breast: Secondary | ICD-10-CM

## 2016-07-26 DIAGNOSIS — Z170 Estrogen receptor positive status [ER+]: Principal | ICD-10-CM

## 2016-07-26 MED ORDER — MIRTAZAPINE 15 MG PO TBDP: 15.0000 mg | ORAL_TABLET | Freq: Every day | ORAL | 1 refills | Status: DC

## 2016-07-29 ENCOUNTER — Ambulatory Visit (INDEPENDENT_AMBULATORY_CARE_PROVIDER_SITE_OTHER): Payer: Medicare Other | Admitting: Nurse Practitioner

## 2016-07-29 ENCOUNTER — Encounter: Payer: Self-pay | Admitting: Nurse Practitioner

## 2016-07-29 VITALS — BP 180/104 | HR 70 | Ht 67.0 in | Wt 196.2 lb

## 2016-07-29 DIAGNOSIS — I110 Hypertensive heart disease with heart failure: Secondary | ICD-10-CM

## 2016-07-29 DIAGNOSIS — I5032 Chronic diastolic (congestive) heart failure: Secondary | ICD-10-CM

## 2016-07-29 DIAGNOSIS — I421 Obstructive hypertrophic cardiomyopathy: Secondary | ICD-10-CM

## 2016-07-29 MED ORDER — CLONIDINE HCL 0.1 MG PO TABS: 0.2000 mg | ORAL_TABLET | Freq: Three times a day (TID) | ORAL | 3 refills | Status: DC

## 2016-07-29 NOTE — Progress Notes (Signed)
Office Visit    Patient Name: Cassidy Ortiz Date of Encounter: 07/29/2016  Primary Care Provider:  Rica Mast, MD Primary Cardiologist:  Johnny Bridge, MD   Chief Complaint    75 y/o ? with a h/o HTN, diast CHF, HOCM, and cancer, who presents for f/u.  Past Medical History    Past Medical History:  Diagnosis Date  . Abnormal weight gain   . Acquired cyst of kidney   . Anxiety state, unspecified   . Baker's cyst of knee    Left, pt does not remember  . Breast cancer ([place]) 2001   RT LUMPECTOMY  . Breast cancer of upper-inner quadrant of left female breast ([place]) 04/19/2016  . Cerebral aneurysm rupture ([facility])    stent  . Cerebral aneurysm, nonruptured 2008   s/p coil and shunt, performed in [place]  . Chronic diastolic CHF (congestive heart failure) ([place])    a. 05/2016 Echo: EF 60-65%, no rwma, mild AI, nl RV fxn, mildly dil LA.  Marland Kitchen Chronic kidney disease, stage III (moderate)   . Depressive disorder, not elsewhere classified   . Diverticulosis of colon (without mention of hemorrhage)   . Duodenal mass   . Duodenal ulcer   . Edema   . Fatty liver   . Grave's disease   . HOCM (hypertrophic obstructive cardiomyopathy) ([place])    a. 06/2012 Echo: EF 65-70%, sev LVH w/ dynamic obstruction, Gr1 DD, mild AI, mildly dil LA;  b. 05/2016 Echo: EF 60-65%, mild LVH, no rwma.  . Hypertension   . Hypertensive kidney disease, benign   . Hypothyroid   . Malignant neoplasm of breast (female), unspecified site 2001   right, s/p lumpectomy and XRT  . Malignant neoplasm of kidney, except pelvis 2008   s/p partial nephrectomy  . Mixed hyperlipidemia   . Obstructive sleep apnea (adult) (pediatric)   . Personal history of colonic polyps 10/22/2002   hyperplastic   . Radiation 2001   BREAST CA  . Sleep apnea    off cpap  . Unspecified hypothyroidism   . Vitamin D deficiency    Past Surgical History:  Procedure Laterality Date  . BREAST BIOPSY Left 03/14/2016   stereo  . BREAST  LUMPECTOMY Right    right  . CATARACT EXTRACTION     bilateral  . COLONOSCOPY     last 2012.+ TA polyps  . CORONARY STENT PLACEMENT     brain  . CSF SHUNT  08/17/2008  . FEMORAL ARTERY REPAIR  2011  . HEMIARTHROPLASTY SHOULDER FRACTURE     left  . PARTIAL NEPHRECTOMY     right  . RADIOACTIVE SEED GUIDED MASTECTOMY WITH AXILLARY SENTINEL LYMPH NODE BIOPSY Left 04/19/2016   Procedure: RADIOACTIVE SEED GUIDED PARTIAL MASTECTOMY WITH AXILLARY SENTINEL LYMPH NODE BIOPSY;  Surgeon: Fanny Skates, MD;  Location: [facility];  Service: General;  Laterality: Left;  . SHOULDER SURGERY     left  . UPPER GASTROINTESTINAL ENDOSCOPY  11-10-2014  . VAGINAL DELIVERY     3    Allergies  Allergies  Allergen Reactions  . 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 She also underwent stenting for right peri-thalamic artery aneurysm in July 2000. She is residual left internal carotid artery aneurysm. She also has a history of HOCM and severe  LVH by prior echo, though repeat echo in June of this year, showed only mild LVH. She is currently being treated with chemotherapy in the cancer clinic for breast cancer. She continues to have high blood pressures at home, typically in the 170s in the morning. She has not been having any chest pain, dyspnea, PND, orthopnea, dizziness, syncope, edema, or early satiety. She is frustrated that her blood pressure remains elevated despite multiple medications.  Home Medications    Prior to Admission medications   Medication Sig Start Date End Date Taking? Authorizing Provider  ALPRAZolam (XANAX) 0.25 MG tablet Take 1 tablet (0.25 mg total) by mouth 3 (three) times daily as needed for anxiety. 07/03/16   Minna Merritts, MD  aspirin 81 MG tablet Take 162 mg by mouth daily at 12 noon.  Take at noon.    Historical Provider, MD  atorvastatin (LIPITOR) 40 MG tablet TAKE 1 TABLET DAILY 05/07/16   Jackolyn Confer, MD  buPROPion [facility] SR) 100 MG 12 hr tablet Take 100 mg by mouth 2 (two) times daily.    Historical Provider, MD  carvedilol (COREG) 12.5 MG tablet Take 1 tablet (12.5 mg total) by mouth 2 (two) times daily with a meal. 07/03/16   Minna Merritts, MD  cloNIDine (CATAPRES) 0.1 MG tablet Take 1 tablet (0.1 mg total) by mouth 3 (three) times daily. 07/03/16   Minna Merritts, MD  dexamethasone (DECADRON) 4 MG tablet Take 4 mg by mouth 2 (two) times daily with a meal. TAKE TWO TABLETS BY MOUTH ONCE A DAY ON THE DAY AFTER CHEMOTHERAPY AND THEN TAKE 2 TABLETS TWO TIMES A DAY FOR 2 DAYS. TAKE WITH FOOD.    Historical Provider, MD  esomeprazole (NEXIUM) 20 MG capsule Take 20 mg by mouth every morning.    Historical Provider, MD  hydrALAZINE (APRESOLINE) 100 MG tablet Take 1 tablet (100 mg total) by mouth 3 (three) times daily. Patient taking differently: Take 50 mg by mouth 3 (three) times daily.  07/03/16   Minna Merritts, MD  levothyroxine (SYNTHROID) 175 MCG tablet Take 1 tablet (175 mcg total) by mouth daily. 10/05/15   Jackolyn Confer, MD  lidocaine-prilocaine (EMLA) cream Apply to affected area as needed 05/22/16   Volanda Napoleon, MD  mirtazapine (REMERON SOL-TAB) 15 MG disintegrating tablet Take 1 tablet (15 mg total) by mouth at bedtime. 07/26/16   Leone Haven, MD  Multiple Vitamin (MULTIVITAMIN) tablet Take 1 tablet by mouth daily.      Historical Provider, MD  nitroGLYCERIN (NITROSTAT) 0.4 MG SL tablet Place 1 tablet (0.4 mg total) under the tongue every 5 (five) minutes as needed for chest pain. 04/05/15   Minna Merritts, MD  ondansetron (ZOFRAN) 4 MG tablet Take 1-2 tabs by mouth every 8 hours as needed for nausea/vomiting Patient not taking: Reported on 07/25/2016 06/22/16   Hinda Kehr, MD  prochlorperazine (COMPAZINE) 10 MG tablet Take 1 tablet (10 mg  total) by mouth every 6 (six) hours as needed (Nausea or vomiting). Patient not taking: Reported on 07/25/2016 05/22/16   Volanda Napoleon, MD  rOPINIRole (REQUIP) 1 MG tablet TAKE 1 TABLET (1 MG TOTAL) BY MOUTH DAILY AS NEEDED. 04/29/16   Jackolyn Confer, MD  traZODone (DESYREL) 100 MG tablet Take 1 tablet (100 mg total) by mouth every evening. 07/24/16   Leone Haven, MD    Review of Systems    Overall feeling reasonably well.  She denies chest pain, palpitations,  dyspnea, pnd, orthopnea, n, v, dizziness, syncope, edema, weight gain, or early satiety.  All other systems reviewed and are otherwise negative except as noted above.  Physical Exam    VS:  BP (!) 180/104 (BP Location: Right Arm, Patient Position: Sitting, Cuff Size: Normal)   Pulse 70   Ht 5\' 7"  (1.702 m)   Wt 196 lb 4 oz (89 kg)   BMI 30.74 kg/m  , BMI Body mass index is 30.74 kg/m. GEN: Well nourished, well developed, in no acute distress.  HEENT: normal.  Neck: Supple, no JVD, carotid bruits, or masses. Cardiac: RRR, 2/6 sem throughout, no rubs, or gallops. No clubbing, cyanosis, edema.  Radials/DP/PT 2+ and equal bilaterally.  Respiratory:  Respirations regular and unlabored, clear to auscultation bilaterally. GI: Soft, nontender, nondistended, BS + x 4. MS: no deformity or atrophy. Skin: warm and dry, no rash. Neuro:  Strength and sensation are intact. Psych: Normal affect.  Accessory Clinical Findings    None  Assessment & Plan    1.  Hypertensive Heart Dzs:  Pt has continued to have elevated BPs @ home - often in the 170's.  She is 180/104 today.  We reviewed her meds extensively.  I will increase her clonidine to 0.2mg  TID.  We discussed that she may be better served by a clonidine patch, for better 24 hr coverage, however, she feels that-that may be cost prohibitive.  She will continue to follow her BP @ home daily and call us at the end of the week with her numbers.  If she is consistently over 150, I  would recommend that we further push her clonidine to 0.3mg  TID.  Her lisinopril is also a possibility for future titration.  2.  Chronic Diastolic CHF:  Euvolemic.  As above, she needs better BP control.    3.  HOCM:  Recent echo with only mild LVH and nl ef.  No mention of HOCM.  4.  HL:  Cont statin.    5.  CKD III:  Creat 1.6 on 8/17.  Cont Acei.   6.  Dispo:  Pt will call w/ bps @ the end of the week.  F/u in 1 month.  Murray Hodgkins, NP 07/29/2016, 4:38 PM

## 2016-07-29 NOTE — Patient Instructions (Addendum)
Medication Instructions:  Your physician has recommended you make the following change in your medication:  INCREASE clonidine to 0.2mg  three times daily   Labwork: none  Testing/Procedures: none  Follow-Up: Your physician recommends that you schedule a follow-up appointment in: one month with Dr. Rockey Situ, Ignacia Bayley, NP or Christell Faith, PA-C   Any Other Special Instructions Will Be Listed Below (If Applicable). Please call [facility] on Friday with your blood pressure readings.      If you need a refill on your cardiac medications before your next appointment, please call your pharmacy.

## 2016-08-01 ENCOUNTER — Ambulatory Visit (HOSPITAL_BASED_OUTPATIENT_CLINIC_OR_DEPARTMENT_OTHER): Payer: Medicare Other

## 2016-08-01 ENCOUNTER — Other Ambulatory Visit (HOSPITAL_BASED_OUTPATIENT_CLINIC_OR_DEPARTMENT_OTHER): Payer: Medicare Other

## 2016-08-01 VITALS — BP 156/74 | HR 63 | Temp 97.9°F | Resp 16

## 2016-08-01 DIAGNOSIS — C50912 Malignant neoplasm of unspecified site of left female breast: Secondary | ICD-10-CM

## 2016-08-01 DIAGNOSIS — C50611 Malignant neoplasm of axillary tail of right female breast: Secondary | ICD-10-CM

## 2016-08-01 DIAGNOSIS — D500 Iron deficiency anemia secondary to blood loss (chronic): Secondary | ICD-10-CM | POA: Diagnosis not present

## 2016-08-01 LAB — BASIC METABOLIC PANEL - CANCER CENTER ONLY
BUN: 48 mg/dL — AB (ref 7–22)
CHLORIDE: 104 meq/L (ref 98–108)
CO2: 28 mEq/L (ref 18–33)
CREATININE: 2.1 mg/dL — AB (ref 0.6–1.2)
Calcium: 8.9 mg/dL (ref 8.0–10.3)
Glucose, Bld: 142 mg/dL — ABNORMAL HIGH (ref 73–118)
Potassium: 3.7 mEq/L (ref 3.3–4.7)
Sodium: 136 mEq/L (ref 128–145)

## 2016-08-01 LAB — CBC WITH DIFFERENTIAL (CANCER CENTER ONLY)
BASO#: 0 10*3/uL (ref 0.0–0.2)
BASO%: 0.4 % (ref 0.0–2.0)
EOS ABS: 0.1 10*3/uL (ref 0.0–0.5)
EOS%: 2.4 % (ref 0.0–7.0)
HEMATOCRIT: 32.3 % — AB (ref 34.8–46.6)
HGB: 11.2 g/dL — ABNORMAL LOW (ref 11.6–15.9)
LYMPH#: 0.3 10*3/uL — AB (ref 0.9–3.3)
LYMPH%: 7.3 % — ABNORMAL LOW (ref 14.0–48.0)
MCH: 32.4 pg (ref 26.0–34.0)
MCHC: 34.7 g/dL (ref 32.0–36.0)
MCV: 93 fL (ref 81–101)
MONO#: 0.1 10*3/uL (ref 0.1–0.9)
MONO%: 1.3 % (ref 0.0–13.0)
NEUT#: 4 10*3/uL (ref 1.5–6.5)
NEUT%: 88.6 % — AB (ref 39.6–80.0)
Platelets: 122 10*3/uL — ABNORMAL LOW (ref 145–400)
RBC: 3.46 10*6/uL — ABNORMAL LOW (ref 3.70–5.32)
RDW: 15.8 % — AB (ref 11.1–15.7)
WBC: 4.5 10*3/uL (ref 3.9–10.0)

## 2016-08-01 MED ORDER — SODIUM CHLORIDE 0.9 % IV SOLN: Freq: Once | INTRAVENOUS | Status: DC

## 2016-08-01 MED ORDER — SODIUM CHLORIDE 0.9 % IJ SOLN
10.0000 mL | INTRAMUSCULAR | Status: DC | PRN
  Administered 2016-08-01: 10 mL
  Filled 2016-08-01: qty 10

## 2016-08-01 MED ORDER — SODIUM CHLORIDE 0.9 % IV SOLN
Freq: Once | INTRAVENOUS | Status: AC
  Administered 2016-08-01: 11:00:00 via INTRAVENOUS

## 2016-08-01 MED ORDER — SODIUM CHLORIDE 0.9 % IV SOLN
Freq: Once | INTRAVENOUS | Status: AC
  Administered 2016-08-01: 11:00:00 via INTRAVENOUS
  Filled 2016-08-01: qty 4

## 2016-08-01 MED ORDER — HEPARIN SOD (PORK) LOCK FLUSH 100 UNIT/ML IV SOLN
500.0000 [IU] | Freq: Once | INTRAVENOUS | Status: AC | PRN
  Administered 2016-08-01: 500 [IU]
  Filled 2016-08-01: qty 5

## 2016-08-01 NOTE — Patient Instructions (Signed)

## 2016-08-28 ENCOUNTER — Ambulatory Visit (HOSPITAL_BASED_OUTPATIENT_CLINIC_OR_DEPARTMENT_OTHER): Payer: Medicare Other | Admitting: Hematology & Oncology

## 2016-08-28 ENCOUNTER — Ambulatory Visit (HOSPITAL_BASED_OUTPATIENT_CLINIC_OR_DEPARTMENT_OTHER): Payer: Medicare Other

## 2016-08-28 ENCOUNTER — Other Ambulatory Visit (HOSPITAL_BASED_OUTPATIENT_CLINIC_OR_DEPARTMENT_OTHER): Payer: Medicare Other

## 2016-08-28 ENCOUNTER — Encounter: Payer: Self-pay | Admitting: Hematology & Oncology

## 2016-08-28 VITALS — BP 131/66 | HR 59 | Temp 98.4°F | Resp 18 | Ht 67.0 in | Wt 196.4 lb

## 2016-08-28 DIAGNOSIS — C50912 Malignant neoplasm of unspecified site of left female breast: Secondary | ICD-10-CM

## 2016-08-28 DIAGNOSIS — Z85528 Personal history of other malignant neoplasm of kidney: Secondary | ICD-10-CM

## 2016-08-28 DIAGNOSIS — Z853 Personal history of malignant neoplasm of breast: Secondary | ICD-10-CM

## 2016-08-28 DIAGNOSIS — D500 Iron deficiency anemia secondary to blood loss (chronic): Secondary | ICD-10-CM

## 2016-08-28 DIAGNOSIS — Z170 Estrogen receptor positive status [ER+]: Secondary | ICD-10-CM | POA: Diagnosis not present

## 2016-08-28 DIAGNOSIS — C50611 Malignant neoplasm of axillary tail of right female breast: Secondary | ICD-10-CM

## 2016-08-28 LAB — COMPREHENSIVE METABOLIC PANEL
ALBUMIN: 2.9 g/dL — AB (ref 3.5–5.0)
ALK PHOS: 86 U/L (ref 40–150)
ALT: 11 U/L (ref 0–55)
AST: 12 U/L (ref 5–34)
Anion Gap: 10 mEq/L (ref 3–11)
BUN: 21.3 mg/dL (ref 7.0–26.0)
CALCIUM: 9.2 mg/dL (ref 8.4–10.4)
CO2: 24 mEq/L (ref 22–29)
CREATININE: 1.5 mg/dL — AB (ref 0.6–1.1)
Chloride: 110 mEq/L — ABNORMAL HIGH (ref 98–109)
EGFR: 35 mL/min/{1.73_m2} — ABNORMAL LOW (ref >90)
GLUCOSE: 125 mg/dL (ref 70–140)
Potassium: 3.8 mEq/L (ref 3.5–5.1)
SODIUM: 144 meq/L (ref 136–145)
TOTAL PROTEIN: 6.5 g/dL (ref 6.4–8.3)

## 2016-08-28 LAB — CBC WITH DIFFERENTIAL (CANCER CENTER ONLY)
BASO#: 0 10*3/uL (ref 0.0–0.2)
BASO%: 0.3 % (ref 0.0–2.0)
EOS ABS: 0.2 10*3/uL (ref 0.0–0.5)
EOS%: 2.9 % (ref 0.0–7.0)
HEMATOCRIT: 31.2 % — AB (ref 34.8–46.6)
HEMOGLOBIN: 10.5 g/dL — AB (ref 11.6–15.9)
LYMPH#: 0.6 10*3/uL — AB (ref 0.9–3.3)
LYMPH%: 8.8 % — ABNORMAL LOW (ref 14.0–48.0)
MCH: 31.7 pg (ref 26.0–34.0)
MCHC: 33.7 g/dL (ref 32.0–36.0)
MCV: 94 fL (ref 81–101)
MONO#: 0.5 10*3/uL (ref 0.1–0.9)
MONO%: 7.4 % (ref 0.0–13.0)
NEUT%: 80.6 % — ABNORMAL HIGH (ref 39.6–80.0)
NEUTROS ABS: 5 10*3/uL (ref 1.5–6.5)
Platelets: 258 10*3/uL (ref 145–400)
RBC: 3.31 10*6/uL — AB (ref 3.70–5.32)
RDW: 15.1 % (ref 11.1–15.7)
WBC: 6.2 10*3/uL (ref 3.9–10.0)

## 2016-08-28 MED ORDER — HEPARIN SOD (PORK) LOCK FLUSH 100 UNIT/ML IV SOLN
500.0000 [IU] | Freq: Once | INTRAVENOUS | Status: AC | PRN
  Administered 2016-08-28: 500 [IU]
  Filled 2016-08-28: qty 5

## 2016-08-28 MED ORDER — HEPARIN SOD (PORK) LOCK FLUSH 100 UNIT/ML IV SOLN
250.0000 [IU] | Freq: Once | INTRAVENOUS | Status: DC | PRN
  Filled 2016-08-28: qty 5

## 2016-08-28 MED ORDER — SODIUM CHLORIDE 0.9 % IJ SOLN
10.0000 mL | INTRAMUSCULAR | Status: DC | PRN
  Administered 2016-08-28: 10 mL
  Filled 2016-08-28: qty 10

## 2016-08-28 NOTE — Progress Notes (Signed)
Hematology and Oncology Follow Up Visit  Cassidy Ortiz [id number] Feb 19, 1941 75 y.o. 08/28/2016   Principle Diagnosis:  1. 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 2. 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 3. Intermittent iron-deficiency anemia. 4.  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  Bhat is back for followup. She tolerated her last cycle of chemotherapy better. She got IV fluids. This seemed to help her out quite a bit.   She still has some element of depression. I told her that this is very common. I told her that it is not surprising to me that she has this. I said it may take another couple months before she starts feeling better.   She has had no nausea or vomiting. She seems me seeing pretty well. She's had no change in bowel or bladder habits.   She's had no issues with cough or shortness of breath. She's had no leg swelling. She's had no fever.   Overall, her performance status is ECOG 1.  Medications:  Current Outpatient Prescriptions:  .  ALPRAZolam (XANAX) 0.25 MG tablet, Take 1 tablet (0.25 mg total) by mouth 3 (three) times daily as needed for anxiety., Disp: 30 tablet, Rfl: 0 .  aspirin 81 MG tablet, Take 162 mg by mouth daily at 12 noon. Take at noon., Disp: , Rfl:  .  atorvastatin (LIPITOR) 40 MG tablet, TAKE 1 TABLET DAILY, Disp: 90 tablet, Rfl: 3 .  buPROPion (WELLBUTRIN SR) 100 MG 12 hr tablet, Take 100 mg by mouth 2 (two) times daily., Disp: , Rfl:  .  carvedilol (COREG) 12.5 MG tablet, Take 1 tablet (12.5 mg total) by mouth 2 (two) times daily with a meal., Disp: 90 tablet, Rfl: 3 .  cloNIDine (CATAPRES) 0.1 MG tablet, Take 2 tablets (0.2 mg total) by mouth 3 (three) times daily., Disp: 270 tablet, Rfl: 3 .  dexamethasone (DECADRON) 4 MG tablet, Take 4 mg by  mouth 2 (two) times daily with a meal. TAKE TWO TABLETS BY MOUTH ONCE A DAY ON THE DAY AFTER CHEMOTHERAPY AND THEN TAKE 2 TABLETS TWO TIMES A DAY FOR 2 DAYS. TAKE WITH FOOD., Disp: , Rfl:  .  doxazosin (CARDURA) 4 MG tablet, Take 4 mg by mouth daily., Disp: , Rfl:  .  esomeprazole (NEXIUM) 20 MG capsule, Take 20 mg by mouth every morning., Disp: , Rfl:  .  hydrALAZINE (APRESOLINE) 100 MG tablet, Take 1 tablet (100 mg total) by mouth 3 (three) times daily. (Patient taking differently: Take 50 mg by mouth 3 (three) times daily. ), Disp: 270 tablet, Rfl: 3 .  levothyroxine (SYNTHROID) 175 MCG tablet, Take 1 tablet (175 mcg total) by mouth daily., Disp: 90 tablet, Rfl: 3 .  lisinopril (PRINIVIL,ZESTRIL) 10 MG tablet, Take 10 mg by mouth daily., Disp: , Rfl:  .  mirtazapine (REMERON SOL-TAB) 15 MG disintegrating tablet, Take 1 tablet (15 mg total) by mouth at bedtime., Disp: 90 tablet, Rfl: 1 .  Multiple Vitamin (MULTIVITAMIN) tablet, Take 1 tablet by mouth daily.  , Disp: , Rfl:  .  nitroGLYCERIN (NITROSTAT) 0.4 MG SL tablet, Place 1 tablet (0.4 mg total) under the tongue every 5 (five) minutes as needed for chest pain., Disp: 25 tablet, Rfl: 3 .  ondansetron (  ZOFRAN) 4 MG tablet, Take 1-2 tabs by mouth every 8 hours as needed for nausea/vomiting, Disp: 30 tablet, Rfl: 0 .  prochlorperazine (COMPAZINE) 10 MG tablet, Take 1 tablet (10 mg total) by mouth every 6 (six) hours as needed (Nausea or vomiting)., Disp: 30 tablet, Rfl: 1 .  rOPINIRole (REQUIP) 1 MG tablet, TAKE 1 TABLET (1 MG TOTAL) BY MOUTH DAILY AS NEEDED., Disp: 90 tablet, Rfl: 1 .  traZODone (DESYREL) 100 MG tablet, Take 1 tablet (100 mg total) by mouth every evening. (Patient taking differently: Take 100 mg by mouth every evening. ), Disp: 30 tablet, Rfl: 0 No current facility-administered medications for this visit.   Facility-Administered Medications Ordered in Other Visits:  .  0.9 %  sodium chloride infusion, , Intravenous, Continuous,  Volanda Napoleon, MD, Stopped at 04/20/15 1651 .  heparin lock flush 100 unit/mL, 250 Units, Intracatheter, Once PRN, Volanda Napoleon, MD .  sodium chloride 0.9 % injection 10 mL, 10 mL, Intracatheter, PRN, Volanda Napoleon, MD, 10 mL at 08/28/16 1246  Allergies:  Allergies  Allergen Reactions  . Amlodipine     Leg swelling    Past Medical History, Surgical history, Social history, and Family History were reviewed and updated.  Review of Systems: As above  Physical Exam:  height is _0  (1.702 m) and weight is 196 lb 6.4 oz (89.1 kg). Her oral temperature is 98.4 F (36.9 C). Her blood pressure is 131/66 and her pulse is 59 (abnormal). Her respiration is 18.   Well-developed and well-nourished white female. Head and neck exam shows no ocular or oral lesions. She has chemotherapy-induced alopecia. She has no palpable cervical or supraclavicular lymph nodes. Lungs are clear. Cardiac exam regular in rhythm with no murmurs rubs or bruits. Abdomen is soft. She is good bowel sounds. There is no fluid wave. There is no guarding or rebound tenderness. There is no palpable liver or spleen tip.extremities shows the compression stockings bilaterally. She has minimal edema in her legs. No erythema is noted. She good range of motion and strength. Skin exam no rashes. Neurological exam is non-focal. Breast exam shows left breast  with a healing lumpectomy scar at about 11:00 position. There is some bruising noted. No distinct masses noted in the left breast. She has the healing left axillary sentinel node biopsy scar. Right breast shows contraction of the tissue. She has the nipple inversion which is chronic. She has some radiation changes. She has no obvious mass in the right breast. There is no right axillary adenopathy.   Lab Results  Component Value Date   WBC 4.5 08/01/2016   HGB 11.2 (L) 08/01/2016   HCT 32.3 (L) 08/01/2016   MCV 93 08/01/2016   PLT 122 (L) 08/01/2016     Chemistry       Component Value Date/Time   NA 136 08/01/2016 1021   NA 144 05/16/2016 0901   K 3.7 08/01/2016 1021   K 4.3 05/16/2016 0901   CL 104 08/01/2016 1021   CO2 28 08/01/2016 1021   CO2 25 05/16/2016 0901   BUN 48 (H) 08/01/2016 1021   BUN 31.8 (H) 05/16/2016 0901   CREATININE 2.1 (H) 08/01/2016 1021   CREATININE 1.4 (H) 05/16/2016 0901      Component Value Date/Time   CALCIUM 8.9 08/01/2016 1021   CALCIUM 9.7 05/16/2016 0901   ALKPHOS 63 07/25/2016 0914   ALKPHOS 86 05/16/2016 0901   AST 20 07/25/2016 0914   AST 17 05/16/2016  0901   ALT 17 07/25/2016 0914   ALT 16 05/16/2016 0901   BILITOT 0.60 07/25/2016 0914   BILITOT 0.37 05/16/2016 0901         Impression and Plan: Ms. Lewing is 75 year old female. 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 She has a past history of right renal cell carcinoma.  She now has a new invasive ductal carcinoma of the left breast. Because of the high Oncotype score, we really need to embark upon chemotherapy.  She has completed all chemotherapy. She looks good. I told her that it will take another 2 or 3 months before she really starts feeling better. It will take chemotherapy very this long to get out of her system.  We will now get radiation going. We will call [facility] radiation oncology.  When she finishes her radiation, that we will start her on aromatase inhibitor therapy. I like Femara.   She will light to keep her Port-A-Cath in. I absolutely have no problems with this.   We will plan to see her back in 2 months. I'm just so happy that she got through her treatments. I realize that it was very difficult for her.  Volanda Napoleon, MD 9/20/201712:55 PM

## 2016-08-28 NOTE — Patient Instructions (Signed)

## 2016-08-29 ENCOUNTER — Telehealth: Payer: Self-pay | Admitting: *Deleted

## 2016-08-29 NOTE — Telephone Encounter (Signed)
Appt with Radiation Oncology at [facility] made for Sept 27 at 2:30 p per Dr. Marin Olp request.  PAtient notified and address given.  She will be able to make this.

## 2016-09-02 ENCOUNTER — Ambulatory Visit (INDEPENDENT_AMBULATORY_CARE_PROVIDER_SITE_OTHER): Payer: Medicare Other | Admitting: Nurse Practitioner

## 2016-09-02 ENCOUNTER — Encounter: Payer: Self-pay | Admitting: Nurse Practitioner

## 2016-09-02 VITALS — BP 173/88 | HR 70 | Ht 67.0 in | Wt 191.5 lb

## 2016-09-02 DIAGNOSIS — N183 Chronic kidney disease, stage 3 unspecified: Secondary | ICD-10-CM

## 2016-09-02 DIAGNOSIS — E785 Hyperlipidemia, unspecified: Secondary | ICD-10-CM | POA: Diagnosis not present

## 2016-09-02 DIAGNOSIS — I421 Obstructive hypertrophic cardiomyopathy: Secondary | ICD-10-CM | POA: Diagnosis not present

## 2016-09-02 DIAGNOSIS — I110 Hypertensive heart disease with heart failure: Secondary | ICD-10-CM | POA: Diagnosis not present

## 2016-09-02 DIAGNOSIS — I5032 Chronic diastolic (congestive) heart failure: Secondary | ICD-10-CM

## 2016-09-02 MED ORDER — CLONIDINE HCL 0.1 MG PO TABS: 0.1000 mg | ORAL_TABLET | Freq: Three times a day (TID) | ORAL | 3 refills | Status: DC

## 2016-09-02 NOTE — Progress Notes (Signed)
Office Visit    Patient Name: Cassidy Ortiz Date of Encounter: 09/02/2016  Primary Care Provider:  Rica Mast, MD Primary Cardiologist:  Johnny Bridge, MD   Chief Complaint    75 y/o ? with a h/o HTN, diast CHF, HOCM, and cancer, who presents for f/u.  Past Medical History    Past Medical History:  Diagnosis Date  . Abnormal weight gain   . Acquired cyst of kidney   . Anxiety state, unspecified   . Baker's cyst of knee    Left, pt does not remember  . Breast cancer ([facility]) 2001   RT LUMPECTOMY  . Breast cancer of upper-inner quadrant of left female breast ([facility]) 04/19/2016  . Cerebral aneurysm rupture ([facility])    stent  . Cerebral aneurysm, nonruptured 2008   s/p coil and shunt, performed in [place]  . Chronic diastolic CHF (congestive heart failure) ([facility])    a. 05/2016 Echo: EF 60-65%, no rwma, mild AI, nl RV fxn, mildly dil LA.  Marland Kitchen Chronic kidney disease, stage III (moderate)   . Depressive disorder, not elsewhere classified   . Diverticulosis of colon (without mention of hemorrhage)   . Duodenal mass   . Duodenal ulcer   . Edema   . Fatty liver   . Grave's disease   . HOCM (hypertrophic obstructive cardiomyopathy) ([facility])    a. 06/2012 Echo: EF 65-70%, sev LVH w/ dynamic obstruction, Gr1 DD, mild AI, mildly dil LA;  b. 05/2016 Echo: EF 60-65%, mild LVH, no rwma.  . Hypertension   . Hypertensive kidney disease, benign   . Hypothyroid   . Malignant neoplasm of breast (female), unspecified site 2001   right, s/p lumpectomy and XRT  . Malignant neoplasm of kidney, except pelvis 2008   s/p partial nephrectomy  . Mixed hyperlipidemia   . Obstructive sleep apnea (adult) (pediatric)   . Personal history of colonic polyps 10/22/2002   hyperplastic   . Radiation 2001   BREAST CA  . Sleep apnea    off cpap  . Unspecified hypothyroidism   . Vitamin D deficiency    Past Surgical History:  Procedure Laterality Date  . BREAST BIOPSY Left 03/14/2016   stereo  . BREAST  LUMPECTOMY Right    right  . CATARACT EXTRACTION     bilateral  . COLONOSCOPY     last 2012.+ TA polyps  . CORONARY STENT PLACEMENT     brain  . CSF SHUNT  08/17/2008  . FEMORAL ARTERY REPAIR  2011  . HEMIARTHROPLASTY SHOULDER FRACTURE     left  . PARTIAL NEPHRECTOMY     right  . RADIOACTIVE SEED GUIDED MASTECTOMY WITH AXILLARY SENTINEL LYMPH NODE BIOPSY Left 04/19/2016   Procedure: RADIOACTIVE SEED GUIDED PARTIAL MASTECTOMY WITH AXILLARY SENTINEL LYMPH NODE BIOPSY;  Surgeon: Fanny Skates, MD;  Location: [facility];  Service: General;  Laterality: Left;  . SHOULDER SURGERY     left  . UPPER GASTROINTESTINAL ENDOSCOPY  11-10-2014  . VAGINAL DELIVERY     3    Allergies  Allergies  Allergen Reactions  . 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 She also underwent stenting for right peri-thalamic artery aneurysm in July 2000. She has residual left internal carotid artery aneurysm. She also has a history of HOCM and severe  LVH by prior echo, though repeat echo in June of this year, showed only mild LVH.  Earlier this month, she completed chemotherapy for breast cancer and is pending initiation of radiation.  When I last saw her, her blood pressure was quite elevated and I increased her clonidine from 0.1 mg 3 times a day to 0.2 mg 3 times a day. She says that her blood pressures were running in the 140s on that dose but over the past few days, she has been more fatigued and pressures have been in the 90s to low 100s. In that setting, she has gone back to taking 0.1 mg 3 times a day. She has not been having any chest pain or dyspnea and further denies any PND, orthopnea, dizziness, significant, edema, or early satiety.  Home Medications     ALPRAZolam (XANAX) 0.25 MG tablet Take 1 tablet (0.25 mg  total) by mouth 3 (three) times daily as needed for anxiety. 07/03/16  Yes Minna Merritts, MD  aspirin 81 MG tablet Take 162 mg by mouth daily at 12 noon. Take at noon.   Yes Historical Provider, MD  atorvastatin (LIPITOR) 40 MG tablet TAKE 1 TABLET DAILY 05/07/16  Yes Jackolyn Confer, MD  buPROPion [facility] SR) 100 MG 12 hr tablet Take 100 mg by mouth 2 (two) times daily.   Yes Historical Provider, MD  carvedilol (COREG) 12.5 MG tablet Take 1 tablet (12.5 mg total) by mouth 2 (two) times daily with a meal. 07/03/16  Yes Minna Merritts, MD  cloNIDine (CATAPRES) 0.1 MG tablet Take 1 tablet (0.1 mg total) by mouth 3 (three) times daily. 09/02/16  Yes Rogelia Mire, NP  dexamethasone (DECADRON) 4 MG tablet Take 4 mg by mouth 2 (two) times daily with a meal. TAKE TWO TABLETS BY MOUTH ONCE A DAY ON THE DAY AFTER CHEMOTHERAPY AND THEN TAKE 2 TABLETS TWO TIMES A DAY FOR 2 DAYS. TAKE WITH FOOD.   Yes Historical Provider, MD  doxazosin (CARDURA) 4 MG tablet Take 4 mg by mouth daily.   Yes Historical Provider, MD  esomeprazole (NEXIUM) 20 MG capsule Take 20 mg by mouth every morning.   Yes Historical Provider, MD  hydrALAZINE (APRESOLINE) 100 MG tablet Take 1 tablet (100 mg total) by mouth 3 (three) times daily. 07/03/16  Yes Minna Merritts, MD  levothyroxine (SYNTHROID) 175 MCG tablet Take 1 tablet (175 mcg total) by mouth daily. 10/05/15  Yes Jackolyn Confer, MD  lisinopril (PRINIVIL,ZESTRIL) 10 MG tablet Take 10 mg by mouth daily.   Yes Historical Provider, MD  mirtazapine (REMERON SOL-TAB) 15 MG disintegrating tablet Take 1 tablet (15 mg total) by mouth at bedtime. 07/26/16  Yes Leone Haven, MD  Multiple Vitamin (MULTIVITAMIN) tablet Take 1 tablet by mouth daily.     Yes Historical Provider, MD  nitroGLYCERIN (NITROSTAT) 0.4 MG SL tablet Place 1 tablet (0.4 mg total) under the tongue every 5 (five) minutes as needed for chest pain. 04/05/15  Yes Minna Merritts, MD  ondansetron (ZOFRAN) 4  MG tablet Take 1-2 tabs by mouth every 8 hours as needed for nausea/vomiting 06/22/16  Yes Hinda Kehr, MD  prochlorperazine (COMPAZINE) 10 MG tablet Take 1 tablet (10 mg total) by mouth every 6 (six) hours as needed (Nausea or vomiting). 05/22/16  Yes Volanda Napoleon, MD  rOPINIRole (REQUIP) 1 MG tablet TAKE 1 TABLET (1 MG TOTAL) BY MOUTH DAILY AS NEEDED. 04/29/16  Yes Jackolyn Confer, MD  traZODone ([place])  100 MG tablet Take 1 tablet (100 mg total) by mouth every evening. Patient taking differently: Take 100 mg by mouth every evening.  07/24/16  Yes Leone Haven, MD    Review of Systems    Fatigue as above.  She denies chest pain, palpitations, dyspnea, pnd, orthopnea, n, v, dizziness, syncope, edema, weight gain, or early satiety.  All other systems reviewed and are otherwise negative except as noted above.  Physical Exam    VS:  BP (!) 173/88 (BP Location: Right Arm, Patient Position: Sitting, Cuff Size: Normal)   Pulse 70   Ht 5\' 7"  (1.702 m)   Wt 191 lb 8 oz (86.9 kg)   BMI 29.99 kg/m  , BMI Body mass index is 29.99 kg/m. GEN: Well nourished, well developed, in no acute distress.  HEENT: normal.  Neck: Supple, no JVD, carotid bruits, or masses. Cardiac: RRR, 2/6 SEM noted throughout, no rubs, or gallops. No clubbing, cyanosis, edema.  Radials/DP/PT 2+ and equal bilaterally.  Respiratory:  Respirations regular and unlabored, clear to auscultation bilaterally. GI: Soft, nontender, nondistended, BS + x 4. MS: no deformity or atrophy. Skin: warm and dry, no rash. Neuro:  Strength and sensation are intact. Psych: Normal affect.  Accessory Clinical Findings    None  Assessment & Plan    1.  Hypertensive heart disease: Upon her last visit, she was markedly hypertensive and I increased her clonidine to 0.2 mg 3 times a day. Over the past few days, her pressure has been quite soft, sometimes the 90s to low 100s and she has reduced her dose back to 0.1 mg 3 times a day.  Pressure today is 173/88, though she notes that it was in the 140s this morning. Rather than make abrupt changes, I recommended that she continue to follow her blood pressures at home and contact us for sustain blood pressure trends greater than 140 at which point, we can either go back up on clonidine or potentially titrate her carvedilol. With CK D, I would be reluctant to push her ACE inhibitor further.  I suspect that when she begins radiation, the physiologic stress may result in higher pressures.  2. Chronic diastolic congestive heart failure: Euvolemic. Blood pressures have been better controlled over the past month. She will continue to follow at home as outlined above.  3. Hypertrophic obstructive cardiopathy myopathy: Recent echo shows only mild LVH and normal LV function.   4. Hyperlipidemia: Continue statin therapy.  5. Stage III chronic disease: Creatinine was stable at 1.5 on September 20.  6. Disposition: Patient will continue to follow blood pressures at home and contact us for sustained trends over 140. Follow-up with Dr. Rockey Situ in 3 months or sooner if necessary.  Murray Hodgkins, NP 09/02/2016, 3:51 PM

## 2016-09-02 NOTE — Patient Instructions (Signed)
Medication Instructions:  Please continue your current medications  Labwork: None  Testing/Procedures: None  Follow-Up: 3 months w/ Dr. Rockey Situ  If you need a refill on your cardiac medications before your next appointment, please call your pharmacy.

## 2016-09-04 ENCOUNTER — Encounter: Payer: Self-pay | Admitting: Radiation Oncology

## 2016-09-04 ENCOUNTER — Ambulatory Visit
Admission: RE | Admit: 2016-09-04 | Discharge: 2016-09-04 | Disposition: A | Discharge status: Home / Self Care | Payer: Medicare Other | Source: Ambulatory Visit | Attending: Radiation Oncology | Admitting: Radiation Oncology

## 2016-09-04 VITALS — BP 180/89 | HR 64 | Temp 97.0°F | Ht 67.0 in | Wt 192.5 lb

## 2016-09-04 DIAGNOSIS — Z8673 Personal history of transient ischemic attack (TIA), and cerebral infarction without residual deficits: Secondary | ICD-10-CM | POA: Diagnosis not present

## 2016-09-04 DIAGNOSIS — Z170 Estrogen receptor positive status [ER+]: Secondary | ICD-10-CM | POA: Diagnosis not present

## 2016-09-04 DIAGNOSIS — Z809 Family history of malignant neoplasm, unspecified: Secondary | ICD-10-CM | POA: Insufficient documentation

## 2016-09-04 DIAGNOSIS — K5790 Diverticulosis of intestine, part unspecified, without perforation or abscess without bleeding: Secondary | ICD-10-CM | POA: Diagnosis not present

## 2016-09-04 DIAGNOSIS — Z800 Family history of malignant neoplasm of digestive organs: Secondary | ICD-10-CM | POA: Insufficient documentation

## 2016-09-04 DIAGNOSIS — Z510 Encounter for antineoplastic radiation therapy: Secondary | ICD-10-CM | POA: Insufficient documentation

## 2016-09-04 DIAGNOSIS — C50612 Malignant neoplasm of axillary tail of left female breast: Secondary | ICD-10-CM | POA: Insufficient documentation

## 2016-09-04 DIAGNOSIS — F419 Anxiety disorder, unspecified: Secondary | ICD-10-CM | POA: Diagnosis not present

## 2016-09-04 DIAGNOSIS — K760 Fatty (change of) liver, not elsewhere classified: Secondary | ICD-10-CM | POA: Diagnosis not present

## 2016-09-04 DIAGNOSIS — G4730 Sleep apnea, unspecified: Secondary | ICD-10-CM | POA: Insufficient documentation

## 2016-09-04 DIAGNOSIS — I129 Hypertensive chronic kidney disease with stage 1 through stage 4 chronic kidney disease, or unspecified chronic kidney disease: Secondary | ICD-10-CM | POA: Diagnosis not present

## 2016-09-04 DIAGNOSIS — E0500 Thyrotoxicosis with diffuse goiter without thyrotoxic crisis or storm: Secondary | ICD-10-CM | POA: Insufficient documentation

## 2016-09-04 DIAGNOSIS — Z86010 Personal history of colonic polyps: Secondary | ICD-10-CM | POA: Insufficient documentation

## 2016-09-04 DIAGNOSIS — R609 Edema, unspecified: Secondary | ICD-10-CM | POA: Insufficient documentation

## 2016-09-04 DIAGNOSIS — Z853 Personal history of malignant neoplasm of breast: Secondary | ICD-10-CM | POA: Diagnosis not present

## 2016-09-04 DIAGNOSIS — I5032 Chronic diastolic (congestive) heart failure: Secondary | ICD-10-CM | POA: Insufficient documentation

## 2016-09-04 DIAGNOSIS — Z9221 Personal history of antineoplastic chemotherapy: Secondary | ICD-10-CM | POA: Insufficient documentation

## 2016-09-04 DIAGNOSIS — N183 Chronic kidney disease, stage 3 (moderate): Secondary | ICD-10-CM | POA: Diagnosis not present

## 2016-09-04 DIAGNOSIS — E039 Hypothyroidism, unspecified: Secondary | ICD-10-CM | POA: Insufficient documentation

## 2016-09-04 DIAGNOSIS — Z85528 Personal history of other malignant neoplasm of kidney: Secondary | ICD-10-CM | POA: Diagnosis not present

## 2016-09-04 DIAGNOSIS — Z87891 Personal history of nicotine dependence: Secondary | ICD-10-CM | POA: Insufficient documentation

## 2016-09-04 DIAGNOSIS — K269 Duodenal ulcer, unspecified as acute or chronic, without hemorrhage or perforation: Secondary | ICD-10-CM | POA: Insufficient documentation

## 2016-09-04 DIAGNOSIS — F329 Major depressive disorder, single episode, unspecified: Secondary | ICD-10-CM | POA: Insufficient documentation

## 2016-09-04 DIAGNOSIS — Z7982 Long term (current) use of aspirin: Secondary | ICD-10-CM | POA: Insufficient documentation

## 2016-09-04 DIAGNOSIS — I428 Other cardiomyopathies: Secondary | ICD-10-CM | POA: Diagnosis not present

## 2016-09-04 DIAGNOSIS — Z79899 Other long term (current) drug therapy: Secondary | ICD-10-CM | POA: Insufficient documentation

## 2016-09-04 NOTE — Consult Note (Signed)
NEW PATIENT EVALUATION  Name: Cassidy Ortiz  MRN: [id number]  Date:   09/04/2016     DOB: May 25, 1941   This 75 y.o. 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  REFERRING PHYSICIAN: Jackolyn Confer, MD  CHIEF COMPLAINT:  Chief Complaint  Patient presents with  . Breast Cancer    DIAGNOSIS: The encounter diagnosis was Malignant neoplasm of axillary tail of breast, left.   PREVIOUS INVESTIGATIONS:  Mammograms ultrasound reviewed Pathology report reviewed Clinical notes reviewed   HPI: Patient is a 75 year old female status post lumpectomy and whole breast radiation to her right breast about 15 years prior. She recently presented with an abnormal mammogram of the left breast showing abnormal calcifications in the inner upper quadrant of the left breast which span about 2 cm. MRI confirmed lesion in the upper inner quadrant left breast no other areas of abnormality in either breast were noted. No abnormal lymph nodes were noted. She underwent ultrasound-guided biopsy which was positive for invasive mammary carcinoma. This prompted wide local excision and sentinel node biopsy back in May 2017 showing ER/PR positive grade 2 invasive ductal carcinoma spanning 1.2 cm. 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 One sentinel lymph node was negative for metastatic disease. Patient had Oncotype DX performed showing a relative risk at 32 high rate of recurrence which prompted adjuvant chemotherapy. She completed 4 cycles of adjuvant before meals. She is now referred to radiation oncology for opinion. She is doing well. She is quite fatigued. She specifically denies breast tenderness cough or bone pain. She also does have a history of right  renal cell carcinoma.  PLANNED TREATMENT REGIMEN: Hypofractionated radiation to left breast  PAST MEDICAL HISTORY:  has a past medical history of Abnormal weight gain; Acquired cyst of kidney; Anxiety state, unspecified; Baker's cyst of knee; Breast cancer ([place]) (2001); Breast cancer of upper-inner quadrant of left female breast ([place]) (04/19/2016); Cerebral aneurysm rupture ([place]); Cerebral aneurysm, nonruptured (2008); Chronic diastolic CHF (congestive heart failure) ([place]); Chronic kidney disease, stage III (moderate); Depressive disorder, not elsewhere classified; Diverticulosis of colon (without mention of hemorrhage); Duodenal mass; Duodenal ulcer; Edema; Fatty liver; Grave's disease; HOCM (hypertrophic obstructive cardiomyopathy) ([place]); Hypertension; Hypertensive kidney disease, benign; Hypothyroid; Malignant neoplasm of breast (female), unspecified site (2001); Malignant neoplasm of kidney, except pelvis (2008); Mixed hyperlipidemia; Obstructive sleep apnea (adult) (pediatric); Personal history of colonic polyps (10/22/2002); Radiation (2001); Sleep apnea; Unspecified hypothyroidism; and Vitamin D deficiency.    PAST SURGICAL HISTORY:  Past Surgical History:  Procedure Laterality Date  . BREAST BIOPSY Left 03/14/2016   stereo  . BREAST LUMPECTOMY Right    right  . CATARACT EXTRACTION     bilateral  . COLONOSCOPY     last 2012.+ TA polyps  . CORONARY STENT PLACEMENT     brain  . CSF SHUNT  08/17/2008  . FEMORAL ARTERY REPAIR  2011  . HEMIARTHROPLASTY SHOULDER FRACTURE     left  . PARTIAL NEPHRECTOMY     right  . RADIOACTIVE SEED GUIDED MASTECTOMY WITH AXILLARY SENTINEL LYMPH NODE BIOPSY Left 04/19/2016   Procedure: RADIOACTIVE SEED GUIDED PARTIAL MASTECTOMY WITH AXILLARY SENTINEL LYMPH NODE BIOPSY;  Surgeon: Fanny Skates, MD;  Location: [facility];  Service: General;  Laterality: Left;  . SHOULDER SURGERY  left  . UPPER GASTROINTESTINAL ENDOSCOPY  11-10-2014  . VAGINAL  DELIVERY     3    FAMILY HISTORY: family history includes Cancer in her sister; Colon cancer in her sister; Heart disease in her father and mother.  SOCIAL HISTORY:  reports that she quit smoking about 8 years ago. Her smoking use included Cigarettes. She started smoking about 52 years ago. She has a 43.00 pack-year smoking history. She has never used smokeless tobacco. She reports that she drinks about 0.6 oz of alcohol per week . She reports that she does not use drugs.  ALLERGIES: Amlodipine  MEDICATIONS:  Current Outpatient Prescriptions  Medication Sig Dispense Refill  . ALPRAZolam (XANAX) 0.25 MG tablet Take 1 tablet (0.25 mg total) by mouth 3 (three) times daily as needed for anxiety. 30 tablet 0  . aspirin 81 MG tablet Take 162 mg by mouth daily at 12 noon. Take at noon.    Marland Kitchen atorvastatin (LIPITOR) 40 MG tablet TAKE 1 TABLET DAILY 90 tablet 3  . buPROPion (WELLBUTRIN SR) 100 MG 12 hr tablet Take 100 mg by mouth 2 (two) times daily.    . carvedilol (COREG) 12.5 MG tablet Take 1 tablet (12.5 mg total) by mouth 2 (two) times daily with a meal. 90 tablet 3  . cloNIDine (CATAPRES) 0.1 MG tablet Take 1 tablet (0.1 mg total) by mouth 3 (three) times daily. 270 tablet 3  . dexamethasone (DECADRON) 4 MG tablet Take 4 mg by mouth 2 (two) times daily with a meal. TAKE TWO TABLETS BY MOUTH ONCE A DAY ON THE DAY AFTER CHEMOTHERAPY AND THEN TAKE 2 TABLETS TWO TIMES A DAY FOR 2 DAYS. TAKE WITH FOOD.    Marland Kitchen doxazosin (CARDURA) 4 MG tablet Take 4 mg by mouth daily.    Marland Kitchen esomeprazole (NEXIUM) 20 MG capsule Take 20 mg by mouth every morning.    . hydrALAZINE (APRESOLINE) 100 MG tablet Take 1 tablet (100 mg total) by mouth 3 (three) times daily. 270 tablet 3  . levothyroxine (SYNTHROID) 175 MCG tablet Take 1 tablet (175 mcg total) by mouth daily. 90 tablet 3  . lisinopril (PRINIVIL,ZESTRIL) 10 MG tablet Take 10 mg by mouth daily.    . mirtazapine (REMERON SOL-TAB) 15 MG disintegrating tablet Take 1 tablet  (15 mg total) by mouth at bedtime. 90 tablet 1  . Multiple Vitamin (MULTIVITAMIN) tablet Take 1 tablet by mouth daily.      . nitroGLYCERIN (NITROSTAT) 0.4 MG SL tablet Place 1 tablet (0.4 mg total) under the tongue every 5 (five) minutes as needed for chest pain. 25 tablet 3  . ondansetron (ZOFRAN) 4 MG tablet Take 1-2 tabs by mouth every 8 hours as needed for nausea/vomiting 30 tablet 0  . prochlorperazine (COMPAZINE) 10 MG tablet Take 1 tablet (10 mg total) by mouth every 6 (six) hours as needed (Nausea or vomiting). 30 tablet 1  . rOPINIRole (REQUIP) 1 MG tablet TAKE 1 TABLET (1 MG TOTAL) BY MOUTH DAILY AS NEEDED. 90 tablet 1  . traZODone (DESYREL) 100 MG tablet Take 1 tablet (100 mg total) by mouth every evening. (Patient taking differently: Take 100 mg by mouth every evening. ) 30 tablet 0   No current facility-administered medications for this encounter.    Facility-Administered Medications Ordered in Other Encounters  Medication Dose Route Frequency Provider Last Rate Last Dose  . 0.9 %  sodium chloride infusion   Intravenous Continuous Volanda Napoleon, MD   Stopped at 04/20/15 1651  ECOG PERFORMANCE STATUS:  0 - Asymptomatic  REVIEW OF SYSTEMS: Except for the fatigue Patient denies any weight loss, fatigue, weakness, fever, chills or night sweats. Patient denies any loss of vision, blurred vision. Patient denies any ringing  of the ears or hearing loss. No irregular heartbeat. Patient denies heart murmur or history of fainting. Patient denies any chest pain or pain radiating to her upper extremities. Patient denies any shortness of breath, difficulty breathing at night, cough or hemoptysis. Patient denies any swelling in the lower legs. Patient denies any nausea vomiting, vomiting of blood, or coffee ground material in the vomitus. Patient denies any stomach pain. Patient states has had normal bowel movements no significant constipation or diarrhea. Patient denies any dysuria, hematuria  or significant nocturia. Patient denies any problems walking, swelling in the joints or loss of balance. Patient denies any skin changes, loss of hair or loss of weight. Patient denies any excessive worrying or anxiety or significant depression. Patient denies any problems with insomnia. Patient denies excessive thirst, polyuria, polydipsia. Patient denies any swollen glands, patient denies easy bruising or easy bleeding. Patient denies any recent infections, allergies or URI. Patient "s visual fields have not changed significantly in recent time.   PHYSICAL EXAM: BP (!) 180/89   Pulse 64   Temp 97 F (36.1 C)   Ht 5' 7"  (1.702 m)   Wt 192 lb 7.4 oz (87.3 kg)   BMI 30.14 kg/m  Well-developed elderly female in NAD. She status post wide local excision of left breast which is healed well. Right breast shows evidence of marked contraction of the breast [place] to prior surgery and radiation. No dominant mass or nodularity is noted in either breast in 2 positions examined. No axillary or supraclavicular adenopathy is identified. Well-developed well-nourished patient in NAD. HEENT reveals PERLA, EOMI, discs not visualized.  Oral cavity is clear. No oral mucosal lesions are identified. Neck is clear without evidence of cervical or supraclavicular adenopathy. Lungs are clear to A&P. Cardiac examination is essentially unremarkable with regular rate and rhythm without murmur rub or thrill. Abdomen is benign with no organomegaly or masses noted. Motor sensory and DTR levels are equal and symmetric in the upper and lower extremities. Cranial nerves II through XII are grossly intact. Proprioception is intact. No peripheral adenopathy or edema is identified. No motor or sensory levels are noted. Crude visual fields are within normal range.  LABORATORY DATA: Pathology reports reviewed    RADIOLOGY RESULTS: Mammograms ultrasound and MRI scans reviewed   IMPRESSION: Stage I invasive mammary carcinoma the left  breast status post wide local excision and sentinel node biopsy and adjuvant chemotherapy in 75 year old female. Tumor is ER/PR positive HER-2/neu negative.  PLAN: At this time I believe the patient can undergo external beam radiation to her left breast. Would consider a 4 week course of hyperfractionated treatment. Would also boost her scar another 1400 cGy using electron beam. Risks and benefits of treatment including skin reaction fatigue alteration of light counts possible inclusion of superficial lung all were described in detail to the patient. Her son was present during my examination. I personally set up and ordered CT simulation for early next week. Patient also will be a candidate for antiestrogen therapy after completion of radiation. We will do our best to keep her heart out of her treatment fields.There will be extra effort by both professional staff as well as technical staff to coordinate and manage radiation treatments after chemo and ensuing side effects during her  treatments.  I would like to take this opportunity to thank you for allowing me to participate in the care of your patient.Armstead Peaks., MD

## 2016-09-09 ENCOUNTER — Ambulatory Visit
Admission: RE | Admit: 2016-09-09 | Discharge: 2016-09-09 | Disposition: A | Discharge status: Home / Self Care | Payer: Medicare Other | Source: Ambulatory Visit | Attending: Radiation Oncology | Admitting: Radiation Oncology

## 2016-09-09 DIAGNOSIS — C50612 Malignant neoplasm of axillary tail of left female breast: Secondary | ICD-10-CM | POA: Diagnosis not present

## 2016-09-09 DIAGNOSIS — F329 Major depressive disorder, single episode, unspecified: Secondary | ICD-10-CM | POA: Diagnosis not present

## 2016-09-09 DIAGNOSIS — Z79899 Other long term (current) drug therapy: Secondary | ICD-10-CM | POA: Diagnosis not present

## 2016-09-09 DIAGNOSIS — N183 Chronic kidney disease, stage 3 (moderate): Secondary | ICD-10-CM | POA: Diagnosis not present

## 2016-09-09 DIAGNOSIS — I129 Hypertensive chronic kidney disease with stage 1 through stage 4 chronic kidney disease, or unspecified chronic kidney disease: Secondary | ICD-10-CM | POA: Diagnosis not present

## 2016-09-09 DIAGNOSIS — Z853 Personal history of malignant neoplasm of breast: Secondary | ICD-10-CM | POA: Diagnosis not present

## 2016-09-09 DIAGNOSIS — E0500 Thyrotoxicosis with diffuse goiter without thyrotoxic crisis or storm: Secondary | ICD-10-CM | POA: Diagnosis not present

## 2016-09-09 DIAGNOSIS — Z510 Encounter for antineoplastic radiation therapy: Secondary | ICD-10-CM | POA: Diagnosis not present

## 2016-09-09 DIAGNOSIS — F419 Anxiety disorder, unspecified: Secondary | ICD-10-CM | POA: Diagnosis not present

## 2016-09-09 DIAGNOSIS — K5790 Diverticulosis of intestine, part unspecified, without perforation or abscess without bleeding: Secondary | ICD-10-CM | POA: Diagnosis not present

## 2016-09-09 DIAGNOSIS — K760 Fatty (change of) liver, not elsewhere classified: Secondary | ICD-10-CM | POA: Diagnosis not present

## 2016-09-09 DIAGNOSIS — Z8673 Personal history of transient ischemic attack (TIA), and cerebral infarction without residual deficits: Secondary | ICD-10-CM | POA: Diagnosis not present

## 2016-09-09 DIAGNOSIS — Z86010 Personal history of colonic polyps: Secondary | ICD-10-CM | POA: Diagnosis not present

## 2016-09-09 DIAGNOSIS — R609 Edema, unspecified: Secondary | ICD-10-CM | POA: Diagnosis not present

## 2016-09-09 DIAGNOSIS — Z85528 Personal history of other malignant neoplasm of kidney: Secondary | ICD-10-CM | POA: Diagnosis not present

## 2016-09-09 DIAGNOSIS — I5032 Chronic diastolic (congestive) heart failure: Secondary | ICD-10-CM | POA: Diagnosis not present

## 2016-09-09 DIAGNOSIS — Z170 Estrogen receptor positive status [ER+]: Secondary | ICD-10-CM | POA: Diagnosis not present

## 2016-09-09 DIAGNOSIS — K269 Duodenal ulcer, unspecified as acute or chronic, without hemorrhage or perforation: Secondary | ICD-10-CM | POA: Diagnosis not present

## 2016-09-09 DIAGNOSIS — Z7982 Long term (current) use of aspirin: Secondary | ICD-10-CM | POA: Diagnosis not present

## 2016-09-09 DIAGNOSIS — G4730 Sleep apnea, unspecified: Secondary | ICD-10-CM | POA: Diagnosis not present

## 2016-09-09 DIAGNOSIS — E039 Hypothyroidism, unspecified: Secondary | ICD-10-CM | POA: Diagnosis not present

## 2016-09-09 DIAGNOSIS — I428 Other cardiomyopathies: Secondary | ICD-10-CM | POA: Diagnosis not present

## 2016-09-09 DIAGNOSIS — Z87891 Personal history of nicotine dependence: Secondary | ICD-10-CM | POA: Diagnosis not present

## 2016-09-09 DIAGNOSIS — Z9221 Personal history of antineoplastic chemotherapy: Secondary | ICD-10-CM | POA: Diagnosis not present

## 2016-09-10 ENCOUNTER — Ambulatory Visit: Payer: Medicare Other | Admitting: Nurse Practitioner

## 2016-09-11 ENCOUNTER — Telehealth: Payer: Self-pay | Admitting: *Deleted

## 2016-09-11 DIAGNOSIS — Z8679 Personal history of other diseases of the circulatory system: Secondary | ICD-10-CM | POA: Diagnosis not present

## 2016-09-11 DIAGNOSIS — Z9889 Other specified postprocedural states: Secondary | ICD-10-CM | POA: Diagnosis not present

## 2016-09-11 DIAGNOSIS — I129 Hypertensive chronic kidney disease with stage 1 through stage 4 chronic kidney disease, or unspecified chronic kidney disease: Secondary | ICD-10-CM | POA: Diagnosis not present

## 2016-09-11 DIAGNOSIS — F419 Anxiety disorder, unspecified: Secondary | ICD-10-CM | POA: Diagnosis not present

## 2016-09-11 DIAGNOSIS — I671 Cerebral aneurysm, nonruptured: Secondary | ICD-10-CM | POA: Diagnosis not present

## 2016-09-11 DIAGNOSIS — Z510 Encounter for antineoplastic radiation therapy: Secondary | ICD-10-CM | POA: Diagnosis not present

## 2016-09-11 DIAGNOSIS — C50612 Malignant neoplasm of axillary tail of left female breast: Secondary | ICD-10-CM | POA: Diagnosis not present

## 2016-09-11 DIAGNOSIS — I5032 Chronic diastolic (congestive) heart failure: Secondary | ICD-10-CM | POA: Diagnosis not present

## 2016-09-11 DIAGNOSIS — Z170 Estrogen receptor positive status [ER+]: Secondary | ICD-10-CM | POA: Diagnosis not present

## 2016-09-11 NOTE — Telephone Encounter (Signed)
Received call from [place] at [facility] where patient is having CT.  Accessed patient port without difficulty but brownish fluid came out.  Excellent blood return noted and no pain at site. No redness or swelling noted at site.  Patient afrebrile.  Port flushes easily.  Ok to use per dr. Marin Olp.  Patient to call us if she has a fever or pain at site.

## 2016-09-12 ENCOUNTER — Ambulatory Visit (INDEPENDENT_AMBULATORY_CARE_PROVIDER_SITE_OTHER): Payer: Medicare Other

## 2016-09-12 DIAGNOSIS — Z23 Encounter for immunization: Secondary | ICD-10-CM

## 2016-09-13 ENCOUNTER — Other Ambulatory Visit: Payer: Self-pay | Admitting: *Deleted

## 2016-09-13 DIAGNOSIS — Z170 Estrogen receptor positive status [ER+]: Principal | ICD-10-CM

## 2016-09-13 DIAGNOSIS — C50912 Malignant neoplasm of unspecified site of left female breast: Secondary | ICD-10-CM

## 2016-09-16 ENCOUNTER — Ambulatory Visit
Admission: RE | Admit: 2016-09-16 | Discharge: 2016-09-16 | Disposition: A | Discharge status: Home / Self Care | Payer: Medicare Other | Source: Ambulatory Visit | Attending: Radiation Oncology | Admitting: Radiation Oncology

## 2016-09-16 DIAGNOSIS — C50612 Malignant neoplasm of axillary tail of left female breast: Secondary | ICD-10-CM | POA: Diagnosis not present

## 2016-09-16 DIAGNOSIS — I129 Hypertensive chronic kidney disease with stage 1 through stage 4 chronic kidney disease, or unspecified chronic kidney disease: Secondary | ICD-10-CM | POA: Diagnosis not present

## 2016-09-16 DIAGNOSIS — F419 Anxiety disorder, unspecified: Secondary | ICD-10-CM | POA: Diagnosis not present

## 2016-09-16 DIAGNOSIS — I5032 Chronic diastolic (congestive) heart failure: Secondary | ICD-10-CM | POA: Diagnosis not present

## 2016-09-16 DIAGNOSIS — Z510 Encounter for antineoplastic radiation therapy: Secondary | ICD-10-CM | POA: Diagnosis not present

## 2016-09-16 DIAGNOSIS — Z170 Estrogen receptor positive status [ER+]: Secondary | ICD-10-CM | POA: Diagnosis not present

## 2016-09-17 ENCOUNTER — Ambulatory Visit
Admission: RE | Admit: 2016-09-17 | Discharge: 2016-09-17 | Disposition: A | Discharge status: Home / Self Care | Payer: Medicare Other | Source: Ambulatory Visit | Attending: Radiation Oncology | Admitting: Radiation Oncology

## 2016-09-17 DIAGNOSIS — I129 Hypertensive chronic kidney disease with stage 1 through stage 4 chronic kidney disease, or unspecified chronic kidney disease: Secondary | ICD-10-CM | POA: Diagnosis not present

## 2016-09-17 DIAGNOSIS — Z510 Encounter for antineoplastic radiation therapy: Secondary | ICD-10-CM | POA: Diagnosis not present

## 2016-09-17 DIAGNOSIS — Z170 Estrogen receptor positive status [ER+]: Secondary | ICD-10-CM | POA: Diagnosis not present

## 2016-09-17 DIAGNOSIS — F419 Anxiety disorder, unspecified: Secondary | ICD-10-CM | POA: Diagnosis not present

## 2016-09-17 DIAGNOSIS — I5032 Chronic diastolic (congestive) heart failure: Secondary | ICD-10-CM | POA: Diagnosis not present

## 2016-09-17 DIAGNOSIS — C50612 Malignant neoplasm of axillary tail of left female breast: Secondary | ICD-10-CM | POA: Diagnosis not present

## 2016-09-18 ENCOUNTER — Ambulatory Visit
Admission: RE | Admit: 2016-09-18 | Discharge: 2016-09-18 | Disposition: A | Discharge status: Home / Self Care | Payer: Medicare Other | Source: Ambulatory Visit | Attending: Radiation Oncology | Admitting: Radiation Oncology

## 2016-09-18 DIAGNOSIS — Z510 Encounter for antineoplastic radiation therapy: Secondary | ICD-10-CM | POA: Diagnosis not present

## 2016-09-18 DIAGNOSIS — I129 Hypertensive chronic kidney disease with stage 1 through stage 4 chronic kidney disease, or unspecified chronic kidney disease: Secondary | ICD-10-CM | POA: Diagnosis not present

## 2016-09-18 DIAGNOSIS — F419 Anxiety disorder, unspecified: Secondary | ICD-10-CM | POA: Diagnosis not present

## 2016-09-18 DIAGNOSIS — C50612 Malignant neoplasm of axillary tail of left female breast: Secondary | ICD-10-CM | POA: Diagnosis not present

## 2016-09-18 DIAGNOSIS — I5032 Chronic diastolic (congestive) heart failure: Secondary | ICD-10-CM | POA: Diagnosis not present

## 2016-09-18 DIAGNOSIS — Z170 Estrogen receptor positive status [ER+]: Secondary | ICD-10-CM | POA: Diagnosis not present

## 2016-09-19 ENCOUNTER — Ambulatory Visit
Admission: RE | Admit: 2016-09-19 | Discharge: 2016-09-19 | Disposition: A | Discharge status: Home / Self Care | Payer: Medicare Other | Source: Ambulatory Visit | Attending: Radiation Oncology | Admitting: Radiation Oncology

## 2016-09-19 DIAGNOSIS — I129 Hypertensive chronic kidney disease with stage 1 through stage 4 chronic kidney disease, or unspecified chronic kidney disease: Secondary | ICD-10-CM | POA: Diagnosis not present

## 2016-09-19 DIAGNOSIS — F419 Anxiety disorder, unspecified: Secondary | ICD-10-CM | POA: Diagnosis not present

## 2016-09-19 DIAGNOSIS — I5032 Chronic diastolic (congestive) heart failure: Secondary | ICD-10-CM | POA: Diagnosis not present

## 2016-09-19 DIAGNOSIS — C50612 Malignant neoplasm of axillary tail of left female breast: Secondary | ICD-10-CM | POA: Diagnosis not present

## 2016-09-19 DIAGNOSIS — Z510 Encounter for antineoplastic radiation therapy: Secondary | ICD-10-CM | POA: Diagnosis not present

## 2016-09-19 DIAGNOSIS — Z170 Estrogen receptor positive status [ER+]: Secondary | ICD-10-CM | POA: Diagnosis not present

## 2016-09-20 ENCOUNTER — Ambulatory Visit
Admission: RE | Admit: 2016-09-20 | Discharge: 2016-09-20 | Disposition: A | Discharge status: Home / Self Care | Payer: Medicare Other | Source: Ambulatory Visit | Attending: Radiation Oncology | Admitting: Radiation Oncology

## 2016-09-20 DIAGNOSIS — Z170 Estrogen receptor positive status [ER+]: Secondary | ICD-10-CM | POA: Diagnosis not present

## 2016-09-20 DIAGNOSIS — I5032 Chronic diastolic (congestive) heart failure: Secondary | ICD-10-CM | POA: Diagnosis not present

## 2016-09-20 DIAGNOSIS — C50612 Malignant neoplasm of axillary tail of left female breast: Secondary | ICD-10-CM | POA: Diagnosis not present

## 2016-09-20 DIAGNOSIS — I129 Hypertensive chronic kidney disease with stage 1 through stage 4 chronic kidney disease, or unspecified chronic kidney disease: Secondary | ICD-10-CM | POA: Diagnosis not present

## 2016-09-20 DIAGNOSIS — F419 Anxiety disorder, unspecified: Secondary | ICD-10-CM | POA: Diagnosis not present

## 2016-09-20 DIAGNOSIS — Z510 Encounter for antineoplastic radiation therapy: Secondary | ICD-10-CM | POA: Diagnosis not present

## 2016-09-23 ENCOUNTER — Ambulatory Visit
Admission: RE | Admit: 2016-09-23 | Discharge: 2016-09-23 | Disposition: A | Discharge status: Home / Self Care | Payer: Medicare Other | Source: Ambulatory Visit | Attending: Radiation Oncology | Admitting: Radiation Oncology

## 2016-09-23 DIAGNOSIS — I129 Hypertensive chronic kidney disease with stage 1 through stage 4 chronic kidney disease, or unspecified chronic kidney disease: Secondary | ICD-10-CM | POA: Diagnosis not present

## 2016-09-23 DIAGNOSIS — Z510 Encounter for antineoplastic radiation therapy: Secondary | ICD-10-CM | POA: Diagnosis not present

## 2016-09-23 DIAGNOSIS — F419 Anxiety disorder, unspecified: Secondary | ICD-10-CM | POA: Diagnosis not present

## 2016-09-23 DIAGNOSIS — C50612 Malignant neoplasm of axillary tail of left female breast: Secondary | ICD-10-CM | POA: Diagnosis not present

## 2016-09-23 DIAGNOSIS — Z170 Estrogen receptor positive status [ER+]: Secondary | ICD-10-CM | POA: Diagnosis not present

## 2016-09-23 DIAGNOSIS — I5032 Chronic diastolic (congestive) heart failure: Secondary | ICD-10-CM | POA: Diagnosis not present

## 2016-09-24 ENCOUNTER — Ambulatory Visit
Admission: RE | Admit: 2016-09-24 | Discharge: 2016-09-24 | Disposition: A | Discharge status: Home / Self Care | Payer: Medicare Other | Source: Ambulatory Visit | Attending: Radiation Oncology | Admitting: Radiation Oncology

## 2016-09-24 DIAGNOSIS — C50612 Malignant neoplasm of axillary tail of left female breast: Secondary | ICD-10-CM | POA: Diagnosis not present

## 2016-09-24 DIAGNOSIS — I5032 Chronic diastolic (congestive) heart failure: Secondary | ICD-10-CM | POA: Diagnosis not present

## 2016-09-24 DIAGNOSIS — Z170 Estrogen receptor positive status [ER+]: Secondary | ICD-10-CM | POA: Diagnosis not present

## 2016-09-24 DIAGNOSIS — Z510 Encounter for antineoplastic radiation therapy: Secondary | ICD-10-CM | POA: Diagnosis not present

## 2016-09-24 DIAGNOSIS — F332 Major depressive disorder, recurrent severe without psychotic features: Secondary | ICD-10-CM | POA: Diagnosis not present

## 2016-09-24 DIAGNOSIS — F419 Anxiety disorder, unspecified: Secondary | ICD-10-CM | POA: Diagnosis not present

## 2016-09-24 DIAGNOSIS — G4700 Insomnia, unspecified: Secondary | ICD-10-CM | POA: Diagnosis not present

## 2016-09-24 DIAGNOSIS — I129 Hypertensive chronic kidney disease with stage 1 through stage 4 chronic kidney disease, or unspecified chronic kidney disease: Secondary | ICD-10-CM | POA: Diagnosis not present

## 2016-09-25 ENCOUNTER — Ambulatory Visit
Admission: RE | Admit: 2016-09-25 | Discharge: 2016-09-25 | Disposition: A | Discharge status: Home / Self Care | Payer: Medicare Other | Source: Ambulatory Visit | Attending: Radiation Oncology | Admitting: Radiation Oncology

## 2016-09-25 DIAGNOSIS — I5032 Chronic diastolic (congestive) heart failure: Secondary | ICD-10-CM | POA: Diagnosis not present

## 2016-09-25 DIAGNOSIS — Z510 Encounter for antineoplastic radiation therapy: Secondary | ICD-10-CM | POA: Diagnosis not present

## 2016-09-25 DIAGNOSIS — C50612 Malignant neoplasm of axillary tail of left female breast: Secondary | ICD-10-CM | POA: Diagnosis not present

## 2016-09-25 DIAGNOSIS — Z170 Estrogen receptor positive status [ER+]: Secondary | ICD-10-CM | POA: Diagnosis not present

## 2016-09-25 DIAGNOSIS — F419 Anxiety disorder, unspecified: Secondary | ICD-10-CM | POA: Diagnosis not present

## 2016-09-25 DIAGNOSIS — I129 Hypertensive chronic kidney disease with stage 1 through stage 4 chronic kidney disease, or unspecified chronic kidney disease: Secondary | ICD-10-CM | POA: Diagnosis not present

## 2016-09-26 ENCOUNTER — Other Ambulatory Visit: Payer: Self-pay

## 2016-09-26 ENCOUNTER — Ambulatory Visit
Admission: RE | Admit: 2016-09-26 | Discharge: 2016-09-26 | Disposition: A | Discharge status: Home / Self Care | Payer: Medicare Other | Source: Ambulatory Visit | Attending: Radiation Oncology | Admitting: Radiation Oncology

## 2016-09-26 ENCOUNTER — Inpatient Hospital Stay: Payer: Medicare Other | Attending: Radiation Oncology

## 2016-09-26 DIAGNOSIS — I129 Hypertensive chronic kidney disease with stage 1 through stage 4 chronic kidney disease, or unspecified chronic kidney disease: Secondary | ICD-10-CM | POA: Diagnosis not present

## 2016-09-26 DIAGNOSIS — Z170 Estrogen receptor positive status [ER+]: Secondary | ICD-10-CM | POA: Insufficient documentation

## 2016-09-26 DIAGNOSIS — I5032 Chronic diastolic (congestive) heart failure: Secondary | ICD-10-CM | POA: Diagnosis not present

## 2016-09-26 DIAGNOSIS — C50612 Malignant neoplasm of axillary tail of left female breast: Secondary | ICD-10-CM | POA: Diagnosis not present

## 2016-09-26 DIAGNOSIS — F419 Anxiety disorder, unspecified: Secondary | ICD-10-CM | POA: Diagnosis not present

## 2016-09-26 DIAGNOSIS — C50912 Malignant neoplasm of unspecified site of left female breast: Secondary | ICD-10-CM

## 2016-09-26 DIAGNOSIS — Z510 Encounter for antineoplastic radiation therapy: Secondary | ICD-10-CM | POA: Diagnosis not present

## 2016-09-26 LAB — CBC
HCT: 39.2 % (ref 35.0–47.0)
Hemoglobin: 13.4 g/dL (ref 12.0–16.0)
MCH: 32.2 pg (ref 26.0–34.0)
MCHC: 34.3 g/dL (ref 32.0–36.0)
MCV: 93.8 fL (ref 80.0–100.0)
PLATELETS: 157 10*3/uL (ref 150–440)
RBC: 4.18 MIL/uL (ref 3.80–5.20)
RDW: 15.2 % — AB (ref 11.5–14.5)
WBC: 4.8 10*3/uL (ref 3.6–11.0)

## 2016-09-27 ENCOUNTER — Ambulatory Visit: Payer: Medicare Other

## 2016-09-27 ENCOUNTER — Ambulatory Visit
Admission: RE | Admit: 2016-09-27 | Discharge: 2016-09-27 | Disposition: A | Discharge status: Home / Self Care | Payer: Medicare Other | Source: Ambulatory Visit | Attending: Radiation Oncology | Admitting: Radiation Oncology

## 2016-09-27 ENCOUNTER — Other Ambulatory Visit: Payer: Self-pay

## 2016-09-27 DIAGNOSIS — I5032 Chronic diastolic (congestive) heart failure: Secondary | ICD-10-CM | POA: Diagnosis not present

## 2016-09-27 DIAGNOSIS — I129 Hypertensive chronic kidney disease with stage 1 through stage 4 chronic kidney disease, or unspecified chronic kidney disease: Secondary | ICD-10-CM | POA: Diagnosis not present

## 2016-09-27 DIAGNOSIS — C50612 Malignant neoplasm of axillary tail of left female breast: Secondary | ICD-10-CM | POA: Diagnosis not present

## 2016-09-27 DIAGNOSIS — F419 Anxiety disorder, unspecified: Secondary | ICD-10-CM | POA: Diagnosis not present

## 2016-09-27 DIAGNOSIS — Z510 Encounter for antineoplastic radiation therapy: Secondary | ICD-10-CM | POA: Diagnosis not present

## 2016-09-27 DIAGNOSIS — Z170 Estrogen receptor positive status [ER+]: Secondary | ICD-10-CM | POA: Diagnosis not present

## 2016-09-27 DIAGNOSIS — N3946 Mixed incontinence: Secondary | ICD-10-CM | POA: Diagnosis not present

## 2016-09-27 MED ORDER — HYDRALAZINE HCL 100 MG PO TABS: 100.0000 mg | ORAL_TABLET | Freq: Three times a day (TID) | ORAL | 3 refills | Status: DC

## 2016-09-30 ENCOUNTER — Ambulatory Visit
Admission: RE | Admit: 2016-09-30 | Discharge: 2016-09-30 | Disposition: A | Discharge status: Home / Self Care | Payer: Medicare Other | Source: Ambulatory Visit | Attending: Radiation Oncology | Admitting: Radiation Oncology

## 2016-09-30 DIAGNOSIS — I5032 Chronic diastolic (congestive) heart failure: Secondary | ICD-10-CM | POA: Diagnosis not present

## 2016-09-30 DIAGNOSIS — C50612 Malignant neoplasm of axillary tail of left female breast: Secondary | ICD-10-CM | POA: Diagnosis not present

## 2016-09-30 DIAGNOSIS — I129 Hypertensive chronic kidney disease with stage 1 through stage 4 chronic kidney disease, or unspecified chronic kidney disease: Secondary | ICD-10-CM | POA: Diagnosis not present

## 2016-09-30 DIAGNOSIS — Z510 Encounter for antineoplastic radiation therapy: Secondary | ICD-10-CM | POA: Diagnosis not present

## 2016-09-30 DIAGNOSIS — F419 Anxiety disorder, unspecified: Secondary | ICD-10-CM | POA: Diagnosis not present

## 2016-09-30 DIAGNOSIS — Z170 Estrogen receptor positive status [ER+]: Secondary | ICD-10-CM | POA: Diagnosis not present

## 2016-10-01 ENCOUNTER — Other Ambulatory Visit: Payer: Self-pay | Admitting: Internal Medicine

## 2016-10-01 ENCOUNTER — Ambulatory Visit
Admission: RE | Admit: 2016-10-01 | Discharge: 2016-10-01 | Disposition: A | Discharge status: Home / Self Care | Payer: Medicare Other | Source: Ambulatory Visit | Attending: Radiation Oncology | Admitting: Radiation Oncology

## 2016-10-01 DIAGNOSIS — I5032 Chronic diastolic (congestive) heart failure: Secondary | ICD-10-CM | POA: Diagnosis not present

## 2016-10-01 DIAGNOSIS — Z510 Encounter for antineoplastic radiation therapy: Secondary | ICD-10-CM | POA: Diagnosis not present

## 2016-10-01 DIAGNOSIS — C50612 Malignant neoplasm of axillary tail of left female breast: Secondary | ICD-10-CM | POA: Diagnosis not present

## 2016-10-01 DIAGNOSIS — I129 Hypertensive chronic kidney disease with stage 1 through stage 4 chronic kidney disease, or unspecified chronic kidney disease: Secondary | ICD-10-CM | POA: Diagnosis not present

## 2016-10-01 DIAGNOSIS — Z170 Estrogen receptor positive status [ER+]: Secondary | ICD-10-CM | POA: Diagnosis not present

## 2016-10-01 DIAGNOSIS — F419 Anxiety disorder, unspecified: Secondary | ICD-10-CM | POA: Diagnosis not present

## 2016-10-01 MED ORDER — LEVOTHYROXINE SODIUM 175 MCG PO TABS: 175.0000 ug | ORAL_TABLET | Freq: Every day | ORAL | 0 refills | Status: DC

## 2016-10-01 NOTE — Telephone Encounter (Signed)
Called patient and verified patient has enough Synthroid until OV on 10/24/16 needs TSH no TSH since 2/16.

## 2016-10-01 NOTE — Telephone Encounter (Signed)
refilled 

## 2016-10-01 NOTE — Telephone Encounter (Signed)
Former pt of Dr. Gilford Rile. Last OV: August 2017 with Dr. Lacinda Axon. Next OV: 10/21/16 to est. Care with you. Okay to refill a 90 day supply? Last TSH: 05/11/15.

## 2016-10-02 ENCOUNTER — Ambulatory Visit: Payer: Medicare Other | Admitting: Family Medicine

## 2016-10-02 ENCOUNTER — Ambulatory Visit
Admission: RE | Admit: 2016-10-02 | Discharge: 2016-10-02 | Disposition: A | Discharge status: Home / Self Care | Payer: Medicare Other | Source: Ambulatory Visit | Attending: Radiation Oncology | Admitting: Radiation Oncology

## 2016-10-02 DIAGNOSIS — I5032 Chronic diastolic (congestive) heart failure: Secondary | ICD-10-CM | POA: Diagnosis not present

## 2016-10-02 DIAGNOSIS — F419 Anxiety disorder, unspecified: Secondary | ICD-10-CM | POA: Diagnosis not present

## 2016-10-02 DIAGNOSIS — Z170 Estrogen receptor positive status [ER+]: Secondary | ICD-10-CM | POA: Diagnosis not present

## 2016-10-02 DIAGNOSIS — Z510 Encounter for antineoplastic radiation therapy: Secondary | ICD-10-CM | POA: Diagnosis not present

## 2016-10-02 DIAGNOSIS — C50612 Malignant neoplasm of axillary tail of left female breast: Secondary | ICD-10-CM | POA: Diagnosis not present

## 2016-10-02 DIAGNOSIS — I129 Hypertensive chronic kidney disease with stage 1 through stage 4 chronic kidney disease, or unspecified chronic kidney disease: Secondary | ICD-10-CM | POA: Diagnosis not present

## 2016-10-03 ENCOUNTER — Ambulatory Visit
Admission: RE | Admit: 2016-10-03 | Discharge: 2016-10-03 | Disposition: A | Discharge status: Home / Self Care | Payer: Medicare Other | Source: Ambulatory Visit | Attending: Radiation Oncology | Admitting: Radiation Oncology

## 2016-10-03 DIAGNOSIS — Z510 Encounter for antineoplastic radiation therapy: Secondary | ICD-10-CM | POA: Diagnosis not present

## 2016-10-03 DIAGNOSIS — I129 Hypertensive chronic kidney disease with stage 1 through stage 4 chronic kidney disease, or unspecified chronic kidney disease: Secondary | ICD-10-CM | POA: Diagnosis not present

## 2016-10-03 DIAGNOSIS — Z170 Estrogen receptor positive status [ER+]: Secondary | ICD-10-CM | POA: Diagnosis not present

## 2016-10-03 DIAGNOSIS — F419 Anxiety disorder, unspecified: Secondary | ICD-10-CM | POA: Diagnosis not present

## 2016-10-03 DIAGNOSIS — I5032 Chronic diastolic (congestive) heart failure: Secondary | ICD-10-CM | POA: Diagnosis not present

## 2016-10-03 DIAGNOSIS — C50612 Malignant neoplasm of axillary tail of left female breast: Secondary | ICD-10-CM | POA: Diagnosis not present

## 2016-10-04 ENCOUNTER — Ambulatory Visit
Admission: RE | Admit: 2016-10-04 | Discharge: 2016-10-04 | Disposition: A | Discharge status: Home / Self Care | Payer: Medicare Other | Source: Ambulatory Visit | Attending: Radiation Oncology | Admitting: Radiation Oncology

## 2016-10-04 DIAGNOSIS — I5032 Chronic diastolic (congestive) heart failure: Secondary | ICD-10-CM | POA: Diagnosis not present

## 2016-10-04 DIAGNOSIS — Z170 Estrogen receptor positive status [ER+]: Secondary | ICD-10-CM | POA: Diagnosis not present

## 2016-10-04 DIAGNOSIS — Z510 Encounter for antineoplastic radiation therapy: Secondary | ICD-10-CM | POA: Diagnosis not present

## 2016-10-04 DIAGNOSIS — C50612 Malignant neoplasm of axillary tail of left female breast: Secondary | ICD-10-CM | POA: Diagnosis not present

## 2016-10-04 DIAGNOSIS — I129 Hypertensive chronic kidney disease with stage 1 through stage 4 chronic kidney disease, or unspecified chronic kidney disease: Secondary | ICD-10-CM | POA: Diagnosis not present

## 2016-10-04 DIAGNOSIS — F419 Anxiety disorder, unspecified: Secondary | ICD-10-CM | POA: Diagnosis not present

## 2016-10-07 ENCOUNTER — Ambulatory Visit
Admission: RE | Admit: 2016-10-07 | Discharge: 2016-10-07 | Disposition: A | Discharge status: Home / Self Care | Payer: Medicare Other | Source: Ambulatory Visit | Attending: Radiation Oncology | Admitting: Radiation Oncology

## 2016-10-07 DIAGNOSIS — F419 Anxiety disorder, unspecified: Secondary | ICD-10-CM | POA: Diagnosis not present

## 2016-10-07 DIAGNOSIS — C50612 Malignant neoplasm of axillary tail of left female breast: Secondary | ICD-10-CM | POA: Diagnosis not present

## 2016-10-07 DIAGNOSIS — Z510 Encounter for antineoplastic radiation therapy: Secondary | ICD-10-CM | POA: Diagnosis not present

## 2016-10-07 DIAGNOSIS — I129 Hypertensive chronic kidney disease with stage 1 through stage 4 chronic kidney disease, or unspecified chronic kidney disease: Secondary | ICD-10-CM | POA: Diagnosis not present

## 2016-10-07 DIAGNOSIS — Z170 Estrogen receptor positive status [ER+]: Secondary | ICD-10-CM | POA: Diagnosis not present

## 2016-10-07 DIAGNOSIS — I5032 Chronic diastolic (congestive) heart failure: Secondary | ICD-10-CM | POA: Diagnosis not present

## 2016-10-08 ENCOUNTER — Ambulatory Visit
Admission: RE | Admit: 2016-10-08 | Discharge: 2016-10-08 | Disposition: A | Discharge status: Home / Self Care | Payer: Medicare Other | Source: Ambulatory Visit | Attending: Radiation Oncology | Admitting: Radiation Oncology

## 2016-10-08 DIAGNOSIS — F419 Anxiety disorder, unspecified: Secondary | ICD-10-CM | POA: Diagnosis not present

## 2016-10-08 DIAGNOSIS — Z510 Encounter for antineoplastic radiation therapy: Secondary | ICD-10-CM | POA: Diagnosis not present

## 2016-10-08 DIAGNOSIS — C50612 Malignant neoplasm of axillary tail of left female breast: Secondary | ICD-10-CM | POA: Diagnosis not present

## 2016-10-08 DIAGNOSIS — I5032 Chronic diastolic (congestive) heart failure: Secondary | ICD-10-CM | POA: Diagnosis not present

## 2016-10-08 DIAGNOSIS — I129 Hypertensive chronic kidney disease with stage 1 through stage 4 chronic kidney disease, or unspecified chronic kidney disease: Secondary | ICD-10-CM | POA: Diagnosis not present

## 2016-10-08 DIAGNOSIS — Z170 Estrogen receptor positive status [ER+]: Secondary | ICD-10-CM | POA: Diagnosis not present

## 2016-10-09 ENCOUNTER — Ambulatory Visit
Admission: RE | Admit: 2016-10-09 | Discharge: 2016-10-09 | Disposition: A | Discharge status: Home / Self Care | Payer: Medicare Other | Source: Ambulatory Visit | Attending: Radiation Oncology | Admitting: Radiation Oncology

## 2016-10-09 DIAGNOSIS — I129 Hypertensive chronic kidney disease with stage 1 through stage 4 chronic kidney disease, or unspecified chronic kidney disease: Secondary | ICD-10-CM | POA: Diagnosis not present

## 2016-10-09 DIAGNOSIS — Z170 Estrogen receptor positive status [ER+]: Secondary | ICD-10-CM | POA: Diagnosis not present

## 2016-10-09 DIAGNOSIS — F419 Anxiety disorder, unspecified: Secondary | ICD-10-CM | POA: Diagnosis not present

## 2016-10-09 DIAGNOSIS — I5032 Chronic diastolic (congestive) heart failure: Secondary | ICD-10-CM | POA: Diagnosis not present

## 2016-10-09 DIAGNOSIS — C50612 Malignant neoplasm of axillary tail of left female breast: Secondary | ICD-10-CM | POA: Diagnosis not present

## 2016-10-09 DIAGNOSIS — Z510 Encounter for antineoplastic radiation therapy: Secondary | ICD-10-CM | POA: Diagnosis not present

## 2016-10-10 ENCOUNTER — Inpatient Hospital Stay: Payer: Medicare Other | Attending: Radiation Oncology

## 2016-10-10 ENCOUNTER — Ambulatory Visit
Admission: RE | Admit: 2016-10-10 | Discharge: 2016-10-10 | Disposition: A | Discharge status: Home / Self Care | Payer: Medicare Other | Source: Ambulatory Visit | Attending: Radiation Oncology | Admitting: Radiation Oncology

## 2016-10-10 DIAGNOSIS — C50612 Malignant neoplasm of axillary tail of left female breast: Secondary | ICD-10-CM | POA: Diagnosis not present

## 2016-10-10 DIAGNOSIS — Z510 Encounter for antineoplastic radiation therapy: Secondary | ICD-10-CM | POA: Diagnosis not present

## 2016-10-10 DIAGNOSIS — I5032 Chronic diastolic (congestive) heart failure: Secondary | ICD-10-CM | POA: Diagnosis not present

## 2016-10-10 DIAGNOSIS — F419 Anxiety disorder, unspecified: Secondary | ICD-10-CM | POA: Diagnosis not present

## 2016-10-10 DIAGNOSIS — Z170 Estrogen receptor positive status [ER+]: Secondary | ICD-10-CM | POA: Diagnosis not present

## 2016-10-10 DIAGNOSIS — I129 Hypertensive chronic kidney disease with stage 1 through stage 4 chronic kidney disease, or unspecified chronic kidney disease: Secondary | ICD-10-CM | POA: Diagnosis not present

## 2016-10-11 ENCOUNTER — Ambulatory Visit
Admission: RE | Admit: 2016-10-11 | Discharge: 2016-10-11 | Disposition: A | Discharge status: Home / Self Care | Payer: Medicare Other | Source: Ambulatory Visit | Attending: Radiation Oncology | Admitting: Radiation Oncology

## 2016-10-11 DIAGNOSIS — I129 Hypertensive chronic kidney disease with stage 1 through stage 4 chronic kidney disease, or unspecified chronic kidney disease: Secondary | ICD-10-CM | POA: Diagnosis not present

## 2016-10-11 DIAGNOSIS — Z510 Encounter for antineoplastic radiation therapy: Secondary | ICD-10-CM | POA: Diagnosis not present

## 2016-10-11 DIAGNOSIS — C50612 Malignant neoplasm of axillary tail of left female breast: Secondary | ICD-10-CM | POA: Diagnosis not present

## 2016-10-11 DIAGNOSIS — F419 Anxiety disorder, unspecified: Secondary | ICD-10-CM | POA: Diagnosis not present

## 2016-10-11 DIAGNOSIS — I5032 Chronic diastolic (congestive) heart failure: Secondary | ICD-10-CM | POA: Diagnosis not present

## 2016-10-11 DIAGNOSIS — Z170 Estrogen receptor positive status [ER+]: Secondary | ICD-10-CM | POA: Diagnosis not present

## 2016-10-14 ENCOUNTER — Ambulatory Visit
Admission: RE | Admit: 2016-10-14 | Discharge: 2016-10-14 | Disposition: A | Discharge status: Home / Self Care | Payer: Medicare Other | Source: Ambulatory Visit | Attending: Radiation Oncology | Admitting: Radiation Oncology

## 2016-10-14 DIAGNOSIS — Z510 Encounter for antineoplastic radiation therapy: Secondary | ICD-10-CM | POA: Diagnosis not present

## 2016-10-14 DIAGNOSIS — Z170 Estrogen receptor positive status [ER+]: Secondary | ICD-10-CM | POA: Diagnosis not present

## 2016-10-14 DIAGNOSIS — I129 Hypertensive chronic kidney disease with stage 1 through stage 4 chronic kidney disease, or unspecified chronic kidney disease: Secondary | ICD-10-CM | POA: Diagnosis not present

## 2016-10-14 DIAGNOSIS — C50612 Malignant neoplasm of axillary tail of left female breast: Secondary | ICD-10-CM | POA: Diagnosis not present

## 2016-10-14 DIAGNOSIS — F419 Anxiety disorder, unspecified: Secondary | ICD-10-CM | POA: Diagnosis not present

## 2016-10-14 DIAGNOSIS — I5032 Chronic diastolic (congestive) heart failure: Secondary | ICD-10-CM | POA: Diagnosis not present

## 2016-10-15 ENCOUNTER — Ambulatory Visit
Admission: RE | Admit: 2016-10-15 | Discharge: 2016-10-15 | Disposition: A | Discharge status: Home / Self Care | Payer: Medicare Other | Source: Ambulatory Visit | Attending: Radiation Oncology | Admitting: Radiation Oncology

## 2016-10-15 DIAGNOSIS — Z510 Encounter for antineoplastic radiation therapy: Secondary | ICD-10-CM | POA: Diagnosis not present

## 2016-10-15 DIAGNOSIS — Z170 Estrogen receptor positive status [ER+]: Secondary | ICD-10-CM | POA: Diagnosis not present

## 2016-10-15 DIAGNOSIS — C50612 Malignant neoplasm of axillary tail of left female breast: Secondary | ICD-10-CM | POA: Diagnosis not present

## 2016-10-15 DIAGNOSIS — I5032 Chronic diastolic (congestive) heart failure: Secondary | ICD-10-CM | POA: Diagnosis not present

## 2016-10-15 DIAGNOSIS — F419 Anxiety disorder, unspecified: Secondary | ICD-10-CM | POA: Diagnosis not present

## 2016-10-15 DIAGNOSIS — I129 Hypertensive chronic kidney disease with stage 1 through stage 4 chronic kidney disease, or unspecified chronic kidney disease: Secondary | ICD-10-CM | POA: Diagnosis not present

## 2016-10-16 ENCOUNTER — Ambulatory Visit
Admission: RE | Admit: 2016-10-16 | Discharge: 2016-10-16 | Disposition: A | Discharge status: Home / Self Care | Payer: Medicare Other | Source: Ambulatory Visit | Attending: Radiation Oncology | Admitting: Radiation Oncology

## 2016-10-16 DIAGNOSIS — Z510 Encounter for antineoplastic radiation therapy: Secondary | ICD-10-CM | POA: Diagnosis not present

## 2016-10-16 DIAGNOSIS — I5032 Chronic diastolic (congestive) heart failure: Secondary | ICD-10-CM | POA: Diagnosis not present

## 2016-10-16 DIAGNOSIS — I129 Hypertensive chronic kidney disease with stage 1 through stage 4 chronic kidney disease, or unspecified chronic kidney disease: Secondary | ICD-10-CM | POA: Diagnosis not present

## 2016-10-16 DIAGNOSIS — Z170 Estrogen receptor positive status [ER+]: Secondary | ICD-10-CM | POA: Diagnosis not present

## 2016-10-16 DIAGNOSIS — F419 Anxiety disorder, unspecified: Secondary | ICD-10-CM | POA: Diagnosis not present

## 2016-10-16 DIAGNOSIS — C50612 Malignant neoplasm of axillary tail of left female breast: Secondary | ICD-10-CM | POA: Diagnosis not present

## 2016-10-17 ENCOUNTER — Ambulatory Visit
Admission: RE | Admit: 2016-10-17 | Discharge: 2016-10-17 | Disposition: A | Discharge status: Home / Self Care | Payer: Medicare Other | Source: Ambulatory Visit | Attending: Radiation Oncology | Admitting: Radiation Oncology

## 2016-10-17 DIAGNOSIS — F419 Anxiety disorder, unspecified: Secondary | ICD-10-CM | POA: Diagnosis not present

## 2016-10-17 DIAGNOSIS — C50612 Malignant neoplasm of axillary tail of left female breast: Secondary | ICD-10-CM | POA: Diagnosis not present

## 2016-10-17 DIAGNOSIS — I5032 Chronic diastolic (congestive) heart failure: Secondary | ICD-10-CM | POA: Diagnosis not present

## 2016-10-17 DIAGNOSIS — I129 Hypertensive chronic kidney disease with stage 1 through stage 4 chronic kidney disease, or unspecified chronic kidney disease: Secondary | ICD-10-CM | POA: Diagnosis not present

## 2016-10-17 DIAGNOSIS — Z510 Encounter for antineoplastic radiation therapy: Secondary | ICD-10-CM | POA: Diagnosis not present

## 2016-10-17 DIAGNOSIS — Z170 Estrogen receptor positive status [ER+]: Secondary | ICD-10-CM | POA: Diagnosis not present

## 2016-10-18 ENCOUNTER — Ambulatory Visit
Admission: RE | Admit: 2016-10-18 | Discharge: 2016-10-18 | Disposition: A | Discharge status: Home / Self Care | Payer: Medicare Other | Source: Ambulatory Visit | Attending: Radiation Oncology | Admitting: Radiation Oncology

## 2016-10-21 ENCOUNTER — Encounter: Payer: Self-pay | Admitting: Internal Medicine

## 2016-10-21 ENCOUNTER — Ambulatory Visit (INDEPENDENT_AMBULATORY_CARE_PROVIDER_SITE_OTHER): Payer: Medicare Other | Admitting: Internal Medicine

## 2016-10-21 VITALS — BP 138/72 | HR 75 | Temp 98.7°F | Resp 12 | Ht 67.0 in | Wt 192.8 lb

## 2016-10-21 DIAGNOSIS — G4701 Insomnia due to medical condition: Secondary | ICD-10-CM | POA: Diagnosis not present

## 2016-10-21 DIAGNOSIS — I10 Essential (primary) hypertension: Secondary | ICD-10-CM | POA: Diagnosis not present

## 2016-10-21 DIAGNOSIS — E669 Obesity, unspecified: Secondary | ICD-10-CM

## 2016-10-21 DIAGNOSIS — E559 Vitamin D deficiency, unspecified: Secondary | ICD-10-CM | POA: Diagnosis not present

## 2016-10-21 DIAGNOSIS — F332 Major depressive disorder, recurrent severe without psychotic features: Secondary | ICD-10-CM

## 2016-10-21 DIAGNOSIS — R7301 Impaired fasting glucose: Secondary | ICD-10-CM | POA: Diagnosis not present

## 2016-10-21 DIAGNOSIS — K5904 Chronic idiopathic constipation: Secondary | ICD-10-CM

## 2016-10-21 MED ORDER — NITROGLYCERIN 0.4 MG SL SUBL: 0.4000 mg | SUBLINGUAL_TABLET | SUBLINGUAL | 3 refills | Status: DC | PRN

## 2016-10-21 MED ORDER — LACTULOSE 20 GM/30ML PO SOLN: ORAL | 3 refills | Status: DC

## 2016-10-21 NOTE — Patient Instructions (Addendum)
For your constipation,  Try taking colace (docusate 100 mg ) every night .  It's a stool softener.  You can take this and/or Miralax to improve the frequency of your bowel movements to every other day,  Save the lactulose to use if you do not have a stool in 3 days.     Your blood sugars have been a little elevated, and your protein stores are down a little.    For breakfast,  I would avoid oatmeal and Cream of Wheat and try eating eggs, frittatas or protein shakes   Recommended Low Carb high Protein premixed Shakes:   Premier Protein (my favorite) Atkins Advantage Muscle Milk EAS AdvantEdge   All of these are available at [employer], [employer],  [employer], and [employer]  And taste good     To make a low carb chip :  Take the Joseph's Lavash or Pita bread,  Or the [place] Low carb whole wheat tortilla   Place on metal cookie sheet  Brush with olive oil  Sprinkle garlic powder (NOT garlic salt), grated parmesan cheese, mediterranean seasoning , or all of them?  Bake at 275 for 30 minutes   We have substitutions for your potatoes!!  Try the mashed cauliflower and riced cauliflower dishes instead of rice and mashed potatoes [employer] plain variety   Mashed turnips are also very low carb!   For desserts :  Try the Dannon Lt n Fit greek yogurt dessert flavors and top with reddi Whip .  8 carbs,  80 calories  Try [employer]ik[employer] Triple Zero [place] Yogurt in the salted caramel, and the coffee flavors  With Whipped Cream for dessert  breyer's low carb ice cream, available in bars (on a stick, better ) or scoopable ice cream  HERE ARE THE LOW CARB  BREAD CHOICES

## 2016-10-21 NOTE — Progress Notes (Signed)
Subjective:  Patient ID: Cassidy Ortiz, female    DOB: 10/16/1941  Age: 75 y.o. MRN: [id number]  CC: The primary encounter diagnosis was Impaired fasting glucose. Diagnoses of Vitamin D deficiency, Essential hypertension, Insomnia due to medical condition, Major depressive disorder, recurrent, severe without psychotic features ([place]), Obesity (BMI 30-39.9), and Chronic idiopathic constipation were also pertinent to this visit.  HPI Cassidy Ortiz presents for transition of care from Dr Gilford Rile  History of breast cancer stage 1 , first appearance 16 years ago.  Recurrence last year. I . S/p lumpectomy , now just finished XRT on Friday  Had terrible intolerance ot chemo and Neulasta, still no energy  ,  Ennever managing.  Having chronic constipation, has a BM every 3 days , last BM was yesterday  Stools today have been solid and liquid,  Having suprapubic pain that Is mild. Does not use laxatives..      Hypertension , malignant:  History of Cerebral aneurysm x 3,   Dec 2013, s/p coil, stent , shunt  of  2 , 3rd one not coiled due to complications of femoral artery hemorrhage during second one . Kidney cancer was Found during admission  For aneurysm.  Checks BP three times daily and uses prn NTG for elevations.  NTG rx refilled.    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  c urrently 140's  Some weeks up to 607 systolic   Insomnia: using  ambien for chronic insomnia .  Aware of the risks of continued use given her age.   Impaired fasting glucose:  Reviewed recent CBGS during hospitalization and low albumin stores. . Reviewed diet.  Recommended low glycemic index supplementation with protein shakes,  Low GI diet,  And screening for Type 2 DM   Review of Systems;  Patient denies headache, fevers,, unintentional weight loss, skin rash, eye pain, sinus congestion and sinus pain, sore  throat, dysphagia,  hemoptysis , cough, dyspnea, wheezing, chest pain, palpitations, orthopnea, edema,  nausea, melena, , flank pain, dysuria, hematuria, urinary  Frequency, nocturia, numbness, tingling, seizures,  Focal weakness, Loss of consciousness,  Tremor,depression, anxiety, and suicidal ideation.      Objective:  BP 138/72   Pulse 75   Temp 98.7 F (37.1 C) (Oral)   Resp 12   Ht 5\' 7"  (1.702 m)   Wt 192 lb 12 oz (87.4 kg)   SpO2 96%   BMI 30.19 kg/m   BP Readings from Last 3 Encounters:  10/21/16 138/72  09/04/16 (!) 180/89  09/02/16 (!) 173/88    Wt Readings from Last 3 Encounters:  10/21/16 192 lb 12 oz (87.4 kg)  09/04/16 192 lb 7.4 oz (87.3 kg)  09/02/16 191 lb 8 oz (86.9 kg)    General appearance: alert, cooperative and appears stated age Ears: normal TM's and external ear canals both ears Throat: lips, mucosa, and tongue normal; teeth and gums normal Neck: no adenopathy, no carotid bruit, supple, symmetrical, trachea midline and thyroid not enlarged, symmetric, no tenderness/mass/nodules Back: symmetric, no curvature. ROM normal. No CVA tenderness. Lungs: clear to auscultation bilaterally Heart: regular rate and rhythm, S1, S2 normal, no murmur, click, rub or gallop Abdomen: soft, non-tender; bowel sounds normal; no masses,  no organomegaly Pulses: 2+ and symmetric Skin: Skin color, texture, turgor normal. No rashes or lesions Lymph nodes: Cervical, supraclavicular, and axillary nodes normal.  No results  found for: HGBA1C  Lab Results  Component Value Date   CREATININE 1.5 (H) 08/28/2016   CREATININE 2.1 (H) 08/01/2016   CREATININE 1.6 (H) 07/25/2016    Lab Results  Component Value Date   WBC 4.8 09/26/2016   HGB 13.4 09/26/2016   HCT 39.2 09/26/2016   PLT 157 09/26/2016   GLUCOSE 125 08/28/2016   CHOL 150 07/13/2014   TRIG 114.0 07/13/2014   HDL 58.20 07/13/2014   LDLCALC 69 07/13/2014   ALT 11 08/28/2016   AST 12 08/28/2016   NA 144  08/28/2016   K 3.8 08/28/2016   CL 104 08/01/2016   CREATININE 1.5 (H) 08/28/2016   BUN 21.3 08/28/2016   CO2 24 08/28/2016   TSH 0.74 05/11/2015   INR 1.03 05/22/2016    No results found.  Assessment & Plan:   Problem List Items Addressed This Visit    Hypertension (Chronic)    Managed by Dr. Rockey Situ with 3 month follow up in December planned.  No changes today. Mild CKD noted, will repeat labs in December at Dr Antonieta Pert office.   Lab Results  Component Value Date   CREATININE 1.5 (H) 08/28/2016         Relevant Medications   nitroGLYCERIN (NITROSTAT) 0.4 MG SL tablet   Insomnia    I have reviewed the dangers of prescribing this drug to the elderly with patient.  She has been [place] dependent for over two years, and prior attempts to reduce dose have been unsuccessful resulting in loss of sleep..refills given.       Obesity (BMI 30-39.9)    Complicated by mild protein malnutrition (noted during September hospitalization). Low GI diet and low carb protein supplementation recommended.       Major depressive disorder, recurrent, severe without psychotic features ([place])    Managed by Dr Nicolasa Ducking with wellbutrin and remeron.  medications reviewed,  No signs of escalation of mood disorder.       Constipation    Chronic,  With diverticulosis noted on prior imaging studies. Recommended increased intake of fiber and daily use of stool softener and/or miralax.  Rx Lactulose prn not more than once weekly,        Other Visit Diagnoses    Impaired fasting glucose    -  Primary   Relevant Orders   Hemoglobin A1c   Comprehensive metabolic panel   Vitamin D deficiency       Relevant Orders   VITAMIN D 25 Hydroxy (Vit-D Deficiency, Fractures)       A total of 40 minutes was spent with patient more than half of which was spent in counseling patient on the above mentioned issues , reviewing and explaining recent labs and imaging studies done, and coordination of care.  I have  discontinued Ms. Bagnall's prochlorperazine, ondansetron, traZODone, and dexamethasone. I am also having her start on Lactulose. Additionally, I am having her maintain her aspirin, multivitamin, rOPINIRole, atorvastatin, buPROPion, esomeprazole, ALPRAZolam, carvedilol, mirtazapine, lisinopril, doxazosin, cloNIDine, hydrALAZINE, levothyroxine, zolpidem, fesoterodine, and nitroGLYCERIN.  Meds ordered this encounter  Medications  . zolpidem (AMBIEN) 10 MG tablet    Sig: Take 10 mg by mouth at bedtime as needed for sleep.  . fesoterodine (TOVIAZ) 4 MG TB24 tablet    Sig: Take 4 mg by mouth daily.  . Lactulose 20 GM/30ML SOLN    Sig: 30 ml every 4 hours until constipation is relieved    Dispense:  236 mL    Refill:  3  .  nitroGLYCERIN (NITROSTAT) 0.4 MG SL tablet    Sig: Place 1 tablet (0.4 mg total) under the tongue every 5 (five) minutes as needed for chest pain.    Dispense:  25 tablet    Refill:  3    Medications Discontinued During This Encounter  Medication Reason  . dexamethasone (DECADRON) 4 MG tablet Completed Course  . ondansetron (ZOFRAN) 4 MG tablet Error  . prochlorperazine (COMPAZINE) 10 MG tablet Error  . traZODone (DESYREL) 100 MG tablet Non-compliance  . nitroGLYCERIN (NITROSTAT) 0.4 MG SL tablet Reorder    Follow-up: Return February for 3 month follow up .   Crecencio Mc, MD

## 2016-10-21 NOTE — Progress Notes (Signed)
Pre-visit discussion using our clinic review tool. No additional management support is needed unless otherwise documented below in the visit note.  

## 2016-10-22 DIAGNOSIS — K5900 Constipation, unspecified: Secondary | ICD-10-CM | POA: Insufficient documentation

## 2016-10-22 NOTE — Assessment & Plan Note (Addendum)
Chronic,  With diverticulosis noted on prior imaging studies. Recommended increased intake of fiber and daily use of stool softener and/or miralax.  Rx Lactulose prn not more than once weekly,

## 2016-10-22 NOTE — Assessment & Plan Note (Signed)
Complicated by mild protein malnutrition (noted during September hospitalization). Low GI diet and low carb protein supplementation recommended.

## 2016-10-22 NOTE — Assessment & Plan Note (Signed)
I have reviewed the dangers of prescribing this drug to the elderly with patient.  She has been [place] dependent for over two years, and prior attempts to reduce dose have been unsuccessful resulting in loss of sleep..refills given.

## 2016-10-22 NOTE — Assessment & Plan Note (Signed)
Managed by Dr. Rockey Situ with 3 month follow up in December planned.  No changes today. Mild CKD noted, will repeat labs in December at Dr Antonieta Pert office.   Lab Results  Component Value Date   CREATININE 1.5 (H) 08/28/2016

## 2016-10-22 NOTE — Assessment & Plan Note (Signed)
Managed by Dr Nicolasa Ducking with wellbutrin and remeron.  medications reviewed,  No signs of escalation of mood disorder.

## 2016-10-23 ENCOUNTER — Ambulatory Visit (INDEPENDENT_AMBULATORY_CARE_PROVIDER_SITE_OTHER): Payer: Medicare Other

## 2016-10-23 VITALS — BP 128/82 | HR 73 | Temp 97.6°F | Resp 14 | Ht 65.0 in | Wt 192.8 lb

## 2016-10-23 DIAGNOSIS — Z0000 Encounter for general adult medical examination without abnormal findings: Secondary | ICD-10-CM | POA: Diagnosis not present

## 2016-10-23 NOTE — Patient Instructions (Addendum)
  Cassidy Ortiz , Thank you for taking time to come for your Medicare Wellness Visit. I appreciate your ongoing commitment to your health goals. Please review the following plan we discussed and let me know if I can assist you in the future.   These are the goals we discussed: Goals    . Healthy Lifestyle          Stay hydrated and drink plenty of fluids. Low carb foods. Lean meats, vegetables. Stay active and continue walking as tolerated for exercise.       This is a list of the screening recommended for you and due dates:  Health Maintenance  Topic Date Due  . Tetanus Vaccine  02/11/1962  . Shingles Vaccine  10/23/2016*  . Mammogram  03/14/2018  . Colon Cancer Screening  11/19/2019  . Flu Shot  Completed  . DEXA scan (bone density measurement)  Completed  *Topic was postponed. The date shown is not the original due date.

## 2016-10-23 NOTE — Progress Notes (Signed)
Subjective:   Cassidy Ortiz is a 75 y.o. female who presents for Medicare Annual (Subsequent) preventive examination.  Review of Systems: No ROS.  Medicare Wellness Visit. Cardiac Risk Factors include: advanced age (>67men, >68 women)     Objective:     Vitals: BP 128/82 (BP Location: Right Arm, Patient Position: Sitting, Cuff Size: Normal)   Pulse 73   Temp 97.6 F (36.4 C) (Oral)   Resp 14   Ht 5\' 5"  (1.651 m)   Wt 192 lb 12.8 oz (87.5 kg)   SpO2 96%   BMI 32.08 kg/m   Body mass index is 32.08 kg/m.   Tobacco History  Smoking Status  . Former Smoker  . Packs/day: 1.00  . Years: 43.00  . Types: Cigarettes  . Start date: 04/01/1964  . Quit date: 11/24/2007  Smokeless Tobacco  . Never Used    Comment: quit smoking 7 years ago     Counseling given: Not Answered   Past Medical History:  Diagnosis Date  . Abnormal weight gain   . Acquired cyst of kidney   . Anxiety state, unspecified   . Baker's cyst of knee    Left, pt does not remember  . Breast cancer ([place]) 2001   RT LUMPECTOMY  . Breast cancer of upper-inner quadrant of left female breast ([place]) 04/19/2016  . Cerebral aneurysm rupture ([facility])    stent  . Cerebral aneurysm, nonruptured 2008   s/p coil and shunt, performed in [place]  . Chronic diastolic CHF (congestive heart failure) ([place])    a. 05/2016 Echo: EF 60-65%, no rwma, mild AI, nl RV fxn, mildly dil LA.  Marland Kitchen Chronic kidney disease, stage III (moderate)   . Depressive disorder, not elsewhere classified   . Diverticulosis of colon (without mention of hemorrhage)   . Duodenal mass   . Duodenal ulcer   . Edema   . Fatty liver   . Grave's disease   . HOCM (hypertrophic obstructive cardiomyopathy) ([place])    a. 06/2012 Echo: EF 65-70%, sev LVH w/ dynamic obstruction, Gr1 DD, mild AI, mildly dil LA;  b. 05/2016 Echo: EF 60-65%, mild LVH, no rwma.  . Hypertension   . Hypertensive kidney disease, benign   . Hypothyroid   . Malignant neoplasm of breast (female),  unspecified site 2001   right, s/p lumpectomy and XRT  . Malignant neoplasm of kidney, except pelvis 2008   s/p partial nephrectomy  . Mixed hyperlipidemia   . Obstructive sleep apnea (adult) (pediatric)   . Personal history of colonic polyps 10/22/2002   hyperplastic   . Radiation 2001   BREAST CA  . Sleep apnea    off cpap  . Unspecified hypothyroidism   . Vitamin D deficiency    Past Surgical History:  Procedure Laterality Date  . BREAST BIOPSY Left 03/14/2016   stereo  . BREAST LUMPECTOMY Right    right  . CATARACT EXTRACTION     bilateral  . COLONOSCOPY     last 2012.+ TA polyps  . CORONARY STENT PLACEMENT     brain  . CSF SHUNT  08/17/2008  . FEMORAL ARTERY REPAIR  2011  . HEMIARTHROPLASTY SHOULDER FRACTURE     left  . PARTIAL NEPHRECTOMY     right  . RADIOACTIVE SEED GUIDED MASTECTOMY WITH AXILLARY SENTINEL LYMPH NODE BIOPSY Left 04/19/2016   Procedure: RADIOACTIVE SEED GUIDED PARTIAL MASTECTOMY WITH AXILLARY SENTINEL LYMPH NODE BIOPSY;  Surgeon: Fanny Skates, MD;  Location: [facility];  Service:  General;  Laterality: Left;  . SHOULDER SURGERY     left  . UPPER GASTROINTESTINAL ENDOSCOPY  11-10-2014  . VAGINAL DELIVERY     3   Family History  Problem Relation Age of Onset  . Colon cancer Sister   . Cancer Sister     colon  . Heart disease Father   . Heart disease Mother   . Esophageal cancer Neg Hx   . Rectal cancer Neg Hx   . Stomach cancer Neg Hx    History  Sexual Activity  . Sexual activity: Not Currently    Outpatient Encounter Prescriptions as of 10/23/2016  Medication Sig  . ALPRAZolam (XANAX) 0.25 MG tablet Take 1 tablet (0.25 mg total) by mouth 3 (three) times daily as needed for anxiety.  Marland Kitchen aspirin 81 MG tablet Take 162 mg by mouth daily at 12 noon. Take at noon.  Marland Kitchen atorvastatin (LIPITOR) 40 MG tablet TAKE 1 TABLET DAILY  . buPROPion (WELLBUTRIN SR) 100 MG 12 hr tablet Take 100 mg by mouth 2 (two) times daily.  . carvedilol  (COREG) 12.5 MG tablet Take 1 tablet (12.5 mg total) by mouth 2 (two) times daily with a meal.  . cloNIDine (CATAPRES) 0.1 MG tablet Take 1 tablet (0.1 mg total) by mouth 3 (three) times daily.  Marland Kitchen doxazosin (CARDURA) 4 MG tablet Take 4 mg by mouth daily.  Marland Kitchen esomeprazole (NEXIUM) 20 MG capsule Take 20 mg by mouth every morning.  . fesoterodine (TOVIAZ) 4 MG TB24 tablet Take 4 mg by mouth daily.  . hydrALAZINE (APRESOLINE) 100 MG tablet Take 1 tablet (100 mg total) by mouth 3 (three) times daily.  . Lactulose 20 GM/30ML SOLN 30 ml every 4 hours until constipation is relieved  . levothyroxine (SYNTHROID) 175 MCG tablet Take 1 tablet (175 mcg total) by mouth daily.  Marland Kitchen lisinopril (PRINIVIL,ZESTRIL) 10 MG tablet Take 10 mg by mouth daily.  . mirtazapine (REMERON SOL-TAB) 15 MG disintegrating tablet Take 1 tablet (15 mg total) by mouth at bedtime. (Patient taking differently: Take 45 mg by mouth at bedtime. )  . Multiple Vitamin (MULTIVITAMIN) tablet Take 1 tablet by mouth daily.    . nitroGLYCERIN (NITROSTAT) 0.4 MG SL tablet Place 1 tablet (0.4 mg total) under the tongue every 5 (five) minutes as needed for chest pain.  Marland Kitchen rOPINIRole (REQUIP) 1 MG tablet TAKE 1 TABLET (1 MG TOTAL) BY MOUTH DAILY AS NEEDED.  Marland Kitchen zolpidem (AMBIEN) 10 MG tablet Take 10 mg by mouth at bedtime as needed for sleep.   Facility-Administered Encounter Medications as of 10/23/2016  Medication  . 0.9 %  sodium chloride infusion    Activities of Daily Living In your present state of health, do you have any difficulty performing the following activities: 10/23/2016 06/22/2016  Hearing? N N  Vision? N N  Difficulty concentrating or making decisions? N N  Walking or climbing stairs? Y N  Dressing or bathing? N N  Doing errands, shopping? N N  Preparing Food and eating ? N -  Using the Toilet? N -  In the past six months, have you accidently leaked urine? Y -  Do you have problems with loss of bowel control? N -  Managing  your Medications? N -  Managing your Finances? N -  Housekeeping or managing your Housekeeping? N -  Some recent data might be hidden    Patient Care Team: Crecencio Mc, MD as PCP - General (Internal Medicine) Minna Merritts, MD as Consulting  Physician (Cardiology)    Assessment:    This is a routine wellness examination for [facility]. The goal of the wellness visit is to assist the patient how to close the gaps in care and create a preventative care plan for the patient.   Osteoporosis reviewed.  Medications reviewed; taking without issues or barriers.  Safety issues reviewed; lives with husband.  Smoke detectors in the home. No firearms in the home. Wears seatbelts when driving or riding with others. No violence in the home.  No identified risk were noted; The patient was oriented x 3; appropriate in dress and manner and no objective failures at ADL's or IADL's.   BMI; discussed the importance of a healthy diet, water intake and exercise. Educational material provided.  Patient Concerns: None at this time. Follow up with PCP as needed.  Exercise Activities and Dietary recommendations Current Exercise Habits: The patient does not participate in regular exercise at present  Goals    . Healthy Lifestyle          Stay hydrated and drink plenty of fluids. Low carb foods. Lean meats, vegetables. Stay active and continue walking as tolerated for exercise.      Fall Risk Fall Risk  10/23/2016 08/28/2016 07/25/2016 07/10/2016 06/13/2016  Falls in the past year? No No No No No  Number falls in past yr: - - - - -  Injury with Fall? - - - - -  Risk Factor Category  - - - - -  Risk for fall due to : - - - - -   Depression Screen PHQ 2/9 Scores 10/23/2016 06/06/2016 05/17/2016 10/24/2015  PHQ - 2 Score 0 0 0 0  PHQ- 9 Score - - - -     Cognitive Function MMSE - Mini Mental State Exam 10/24/2015  Orientation to time 5  Orientation to Place 5  Registration 3  Attention/  Calculation 5  Recall 3  Language- name 2 objects 2  Language- repeat 1  Language- follow 3 step command 3  Language- read & follow direction 1  Write a sentence 1  Copy design 1  Total score 30     6CIT Screen 10/23/2016  What Year? 0 points  What month? 0 points  What time? 0 points  Count back from 20 0 points  Months in reverse 0 points    Immunization History  Administered Date(s) Administered  . Influenza Split 08/23/2012  . Influenza, High Dose Seasonal PF 09/12/2016  . Influenza,inj,Quad PF,36+ Mos 09/10/2013, 08/18/2014, 09/15/2015  . Pneumococcal Conjugate-13 07/13/2014  . Pneumococcal Polysaccharide-23 09/23/2011   Screening Tests Health Maintenance  Topic Date Due  . TETANUS/TDAP  02/11/1962  . ZOSTAVAX  10/23/2016 (Originally 02/12/2003)  . MAMMOGRAM  03/14/2018  . COLONOSCOPY  11/19/2019  . INFLUENZA VACCINE  Completed  . DEXA SCAN  Completed      Plan:    End of life planning; Advance aging; Advanced directives discussed. Currently completing HCPOA/Living Will.  Copy requested upon completion.  Medicare Attestation I have personally reviewed: The patient's medical and social history Their use of alcohol, tobacco or illicit drugs Their current medications and supplements The patient's functional ability including ADLs,fall risks, home safety risks, cognitive, and hearing and visual impairment Diet and physical activities Evidence for depression   The patient's weight, height, BMI, and visual acuity have been recorded in the chart.  I have made referrals and provided education to the patient based on review of the above and I have provided the  patient with a written personalized care plan for preventive services.    During the course of the visit the patient was educated and counseled about the following appropriate screening and preventive services:   Vaccines to include Pneumoccal, Influenza, Hepatitis B, Td, Zostavax,  HCV  Electrocardiogram  Cardiovascular Disease  Colorectal cancer screening  Bone density screening  Diabetes screening  Glaucoma screening  Mammography/PAP  Nutrition counseling   Patient Instructions (the written plan) was given to the patient.   Varney Biles, LPN  [id number]

## 2016-10-24 DIAGNOSIS — F332 Major depressive disorder, recurrent severe without psychotic features: Secondary | ICD-10-CM | POA: Diagnosis not present

## 2016-10-24 DIAGNOSIS — G4700 Insomnia, unspecified: Secondary | ICD-10-CM | POA: Diagnosis not present

## 2016-10-25 ENCOUNTER — Telehealth: Payer: Self-pay | Admitting: Internal Medicine

## 2016-10-25 NOTE — Telephone Encounter (Signed)
Twlya from [facility] was calling to verify that we faxed over an rx for Lactulose 20 GM/30ML SOLN. Please advise, thank you!  Call @ [phone] Ref # [id number]

## 2016-10-25 NOTE — Telephone Encounter (Signed)
Script faxed.

## 2016-10-27 NOTE — Progress Notes (Signed)
  I have reviewed the above information and agree with above.   Tiffny Gemmer, MD 

## 2016-10-29 ENCOUNTER — Ambulatory Visit: Payer: Medicare Other | Admitting: Hematology & Oncology

## 2016-10-29 ENCOUNTER — Other Ambulatory Visit: Payer: Medicare Other

## 2016-11-08 ENCOUNTER — Ambulatory Visit (HOSPITAL_BASED_OUTPATIENT_CLINIC_OR_DEPARTMENT_OTHER): Payer: Medicare Other

## 2016-11-08 ENCOUNTER — Other Ambulatory Visit (INDEPENDENT_AMBULATORY_CARE_PROVIDER_SITE_OTHER): Payer: Medicare Other

## 2016-11-08 ENCOUNTER — Other Ambulatory Visit (HOSPITAL_BASED_OUTPATIENT_CLINIC_OR_DEPARTMENT_OTHER): Payer: Medicare Other

## 2016-11-08 ENCOUNTER — Ambulatory Visit (HOSPITAL_BASED_OUTPATIENT_CLINIC_OR_DEPARTMENT_OTHER): Payer: Medicare Other | Admitting: Hematology & Oncology

## 2016-11-08 VITALS — BP 153/69 | HR 63 | Temp 98.5°F | Resp 20 | Wt 196.0 lb

## 2016-11-08 DIAGNOSIS — E559 Vitamin D deficiency, unspecified: Secondary | ICD-10-CM | POA: Diagnosis not present

## 2016-11-08 DIAGNOSIS — Z853 Personal history of malignant neoplasm of breast: Secondary | ICD-10-CM | POA: Diagnosis not present

## 2016-11-08 DIAGNOSIS — C50912 Malignant neoplasm of unspecified site of left female breast: Secondary | ICD-10-CM

## 2016-11-08 DIAGNOSIS — Z170 Estrogen receptor positive status [ER+]: Secondary | ICD-10-CM

## 2016-11-08 DIAGNOSIS — D500 Iron deficiency anemia secondary to blood loss (chronic): Secondary | ICD-10-CM

## 2016-11-08 DIAGNOSIS — Z85528 Personal history of other malignant neoplasm of kidney: Secondary | ICD-10-CM

## 2016-11-08 DIAGNOSIS — C50212 Malignant neoplasm of upper-inner quadrant of left female breast: Secondary | ICD-10-CM

## 2016-11-08 DIAGNOSIS — R7301 Impaired fasting glucose: Secondary | ICD-10-CM | POA: Diagnosis not present

## 2016-11-08 LAB — COMPREHENSIVE METABOLIC PANEL
ALT: 11 U/L (ref 0–55)
ANION GAP: 10 meq/L (ref 3–11)
AST: 13 U/L (ref 5–34)
Albumin: 3.2 g/dL — ABNORMAL LOW (ref 3.5–5.0)
Alkaline Phosphatase: 101 U/L (ref 40–150)
BUN: 35.7 mg/dL — ABNORMAL HIGH (ref 7.0–26.0)
CHLORIDE: 110 meq/L — AB (ref 98–109)
CO2: 24 meq/L (ref 22–29)
CREATININE: 1.4 mg/dL — AB (ref 0.6–1.1)
Calcium: 9.4 mg/dL (ref 8.4–10.4)
EGFR: 36 mL/min/{1.73_m2} — ABNORMAL LOW (ref >90)
Glucose: 119 mg/dl (ref 70–140)
POTASSIUM: 3.9 meq/L (ref 3.5–5.1)
Sodium: 144 mEq/L (ref 136–145)
Total Bilirubin: 0.33 mg/dL (ref 0.20–1.20)
Total Protein: 6.3 g/dL — ABNORMAL LOW (ref 6.4–8.3)

## 2016-11-08 LAB — CBC WITH DIFFERENTIAL (CANCER CENTER ONLY)
BASO#: 0 10*3/uL (ref 0.0–0.2)
BASO%: 0.2 % (ref 0.0–2.0)
EOS%: 4.4 % (ref 0.0–7.0)
Eosinophils Absolute: 0.2 10*3/uL (ref 0.0–0.5)
HCT: 34.6 % — ABNORMAL LOW (ref 34.8–46.6)
HGB: 11.6 g/dL (ref 11.6–15.9)
LYMPH#: 0.7 10*3/uL — ABNORMAL LOW (ref 0.9–3.3)
LYMPH%: 16.2 % (ref 14.0–48.0)
MCH: 30.3 pg (ref 26.0–34.0)
MCHC: 33.5 g/dL (ref 32.0–36.0)
MCV: 90 fL (ref 81–101)
MONO#: 0.4 10*3/uL (ref 0.1–0.9)
MONO%: 8.8 % (ref 0.0–13.0)
NEUT#: 3 10*3/uL (ref 1.5–6.5)
NEUT%: 70.4 % (ref 39.6–80.0)
Platelets: 162 10*3/uL (ref 145–400)
RBC: 3.83 10*6/uL (ref 3.70–5.32)
RDW: 14.1 % (ref 11.1–15.7)
WBC: 4.3 10*3/uL (ref 3.9–10.0)

## 2016-11-08 LAB — VITAMIN D 25 HYDROXY (VIT D DEFICIENCY, FRACTURES): VITD: 36.68 ng/mL (ref 30.00–100.00)

## 2016-11-08 LAB — HEMOGLOBIN A1C: HEMOGLOBIN A1C: 5.6 % (ref 4.6–6.5)

## 2016-11-08 MED ORDER — HEPARIN SOD (PORK) LOCK FLUSH 100 UNIT/ML IV SOLN
500.0000 [IU] | Freq: Once | INTRAVENOUS | Status: AC | PRN
  Administered 2016-11-08: 500 [IU]
  Filled 2016-11-08: qty 5

## 2016-11-08 MED ORDER — SODIUM CHLORIDE 0.9 % IJ SOLN
10.0000 mL | INTRAMUSCULAR | Status: DC | PRN
  Administered 2016-11-08: 10 mL
  Filled 2016-11-08: qty 10

## 2016-11-08 MED ORDER — LETROZOLE 2.5 MG PO TABS: 2.5000 mg | ORAL_TABLET | Freq: Every day | ORAL | 12 refills | Status: DC

## 2016-11-08 NOTE — Progress Notes (Signed)
Hematology and Oncology Follow Up Visit  Cassidy Ortiz [id number] 1940/12/12 75 y.o. 11/08/2016   Principle Diagnosis:  1. 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 2. 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 3. Intermittent iron-deficiency anemia. 4.  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  Ortiz is back for followup. She looks so much better. She feels somewhat better. It is now been almost 3 months since she has had chemotherapy. She is also finished radiation treatments.  She and her husband were down in [place] for Thanksgiving. They had a good time. They went to a relative's house.  We have to get her on Femara. She is ER positive. As such, I think Femara would certainly be reasonable.  She's had no problems with nausea or vomiting. Her hair is coming back nicely. She's had no change in bowel or bladder habits. She has chronic constipation.  She's had no rashes. She's had no leg swelling. She's had no cough. She's had no headache.  Overall, her performance status is ECOG 1.  Medications:  Current Outpatient Prescriptions:  .  ALPRAZolam (XANAX) 0.25 MG tablet, Take 1 tablet (0.25 mg total) by mouth 3 (three) times daily as needed for anxiety., Disp: 30 tablet, Rfl: 0 .  aspirin 81 MG tablet, Take 162 mg by mouth daily at 12 noon. Take at noon., Disp: , Rfl:  .  atorvastatin (LIPITOR) 40 MG tablet, TAKE 1 TABLET DAILY, Disp: 90 tablet, Rfl: 3 .  buPROPion (WELLBUTRIN SR) 100 MG 12 hr tablet, Take 100 mg by mouth 2 (two) times daily., Disp: , Rfl:  .  carvedilol (COREG) 12.5 MG tablet, Take 1 tablet (12.5 mg total) by mouth 2 (two) times daily with a meal., Disp: 90 tablet, Rfl: 3 .  cloNIDine (CATAPRES) 0.1 MG tablet,  Take 1 tablet (0.1 mg total) by mouth 3 (three) times daily., Disp: 270 tablet, Rfl: 3 .  doxazosin (CARDURA) 4 MG tablet, Take 4 mg by mouth daily., Disp: , Rfl:  .  esomeprazole (NEXIUM) 20 MG capsule, Take 20 mg by mouth every morning., Disp: , Rfl:  .  fesoterodine (TOVIAZ) 4 MG TB24 tablet, Take 4 mg by mouth daily., Disp: , Rfl:  .  hydrALAZINE (APRESOLINE) 100 MG tablet, Take 1 tablet (100 mg total) by mouth 3 (three) times daily., Disp: 270 tablet, Rfl: 3 .  Lactulose 20 GM/30ML SOLN, 30 ml every 4 hours until constipation is relieved, Disp: 236 mL, Rfl: 3 .  levothyroxine (SYNTHROID) 175 MCG tablet, Take 1 tablet (175 mcg total) by mouth daily., Disp: 90 tablet, Rfl: 0 .  lisinopril (PRINIVIL,ZESTRIL) 10 MG tablet, Take 10 mg by mouth daily., Disp: , Rfl:  .  mirtazapine (REMERON SOL-TAB) 15 MG disintegrating tablet, Take 1 tablet (15 mg total) by mouth at bedtime. (Patient taking differently: Take 45 mg by mouth at bedtime. ), Disp: 90 tablet, Rfl: 1 .  Multiple Vitamin (MULTIVITAMIN) tablet, Take 1 tablet by mouth daily.  , Disp: , Rfl:  .  nitroGLYCERIN (NITROSTAT) 0.4 MG SL tablet, Place 1 tablet (0.4 mg total) under the tongue every 5 (five) minutes as needed for chest pain., Disp: 25 tablet, Rfl:  3 .  rOPINIRole (REQUIP) 1 MG tablet, TAKE 1 TABLET (1 MG TOTAL) BY MOUTH DAILY AS NEEDED., Disp: 90 tablet, Rfl: 1 .  zolpidem (AMBIEN) 10 MG tablet, Take 10 mg by mouth at bedtime as needed for sleep., Disp: , Rfl:  No current facility-administered medications for this visit.   Facility-Administered Medications Ordered in Other Visits:  .  0.9 %  sodium chloride infusion, , Intravenous, Continuous, Volanda Napoleon, MD, Stopped at 04/20/15 1651 .  sodium chloride 0.9 % injection 10 mL, 10 mL, Intracatheter, PRN, Volanda Napoleon, MD, 10 mL at 11/08/16 1104  Allergies:  Allergies  Allergen Reactions  . Amlodipine     Leg swelling    Past Medical History, Surgical history, Social  history, and Family History were reviewed and updated.  Review of Systems: As above  Physical Exam:  vitals were not taken for this visit.  Well-developed and well-nourished white female. Head and neck exam shows no ocular or oral lesions. She has chemotherapy-induced alopecia. She has no palpable cervical or supraclavicular lymph nodes. Lungs are clear. Cardiac exam regular in rhythm with no murmurs rubs or bruits. Abdomen is soft. She is good bowel sounds. There is no fluid wave. There is no guarding or rebound tenderness. There is no palpable liver or spleen tip.extremities shows the compression stockings bilaterally. She has minimal edema in her legs. No erythema is noted. She good range of motion and strength. Skin exam no rashes. Neurological exam is non-focal. Breast exam shows left breast  with a healing lumpectomy scar at about 11:00 position. She has some erythema from radiation. There is no skin breakdown.  No distinct masses noted in the left breast. She has the healing left axillary sentinel node biopsy scar. Right breast shows contraction of the tissue. She has the nipple inversion which is chronic. She has some radiation changes. She has no obvious mass in the right breast. There is no right axillary adenopathy.   Lab Results  Component Value Date   WBC 4.8 09/26/2016   HGB 13.4 09/26/2016   HCT 39.2 09/26/2016   MCV 93.8 09/26/2016   PLT 157 09/26/2016     Chemistry      Component Value Date/Time   NA 144 08/28/2016 1125   K 3.8 08/28/2016 1125   CL 104 08/01/2016 1021   CO2 24 08/28/2016 1125   BUN 21.3 08/28/2016 1125   CREATININE 1.5 (H) 08/28/2016 1125      Component Value Date/Time   CALCIUM 9.2 08/28/2016 1125   ALKPHOS 86 08/28/2016 1125   AST 12 08/28/2016 1125   ALT 11 08/28/2016 1125   BILITOT <0.30 08/28/2016 1125         Impression and Plan: Cassidy Ortiz is 75 year old female. 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  She has a past history of right renal cell carcinoma.  She now has a new invasive ductal carcinoma of the left breast. Because of the high Oncotype score, we really needed to embark upon chemotherapy.  She has completed all chemotherapy. She completed this in August 2017. She really had a tough time with treatment.  I'm glad that she got through radiation without any difficulties.   We will get her on Femara. I think this would be well tolerated. I told her to take 2000 units of vitamin D daily. This can help with side effects of Femara with the arthralgias.   We'll plan to get her back in  6 weeks. Follow skin 6 weeks, and we can get her back in 3 months.   I'm just happy that she is feeling better. She really looks so much better. Her hair is coming back. Her whole demeanor has improved.  Volanda Napoleon, MD 12/1/201711:20 AM

## 2016-11-14 DIAGNOSIS — H6123 Impacted cerumen, bilateral: Secondary | ICD-10-CM | POA: Diagnosis not present

## 2016-11-14 DIAGNOSIS — H903 Sensorineural hearing loss, bilateral: Secondary | ICD-10-CM | POA: Diagnosis not present

## 2016-11-22 DIAGNOSIS — D631 Anemia in chronic kidney disease: Secondary | ICD-10-CM | POA: Diagnosis not present

## 2016-11-22 DIAGNOSIS — N183 Chronic kidney disease, stage 3 (moderate): Secondary | ICD-10-CM | POA: Diagnosis not present

## 2016-11-22 DIAGNOSIS — R809 Proteinuria, unspecified: Secondary | ICD-10-CM | POA: Diagnosis not present

## 2016-11-22 DIAGNOSIS — I129 Hypertensive chronic kidney disease with stage 1 through stage 4 chronic kidney disease, or unspecified chronic kidney disease: Secondary | ICD-10-CM | POA: Diagnosis not present

## 2016-11-28 ENCOUNTER — Encounter: Payer: Self-pay | Admitting: Radiation Oncology

## 2016-11-28 ENCOUNTER — Ambulatory Visit
Admission: RE | Admit: 2016-11-28 | Discharge: 2016-11-28 | Disposition: A | Discharge status: Home / Self Care | Payer: Medicare Other | Source: Ambulatory Visit | Attending: Radiation Oncology | Admitting: Radiation Oncology

## 2016-11-28 VITALS — BP 149/89 | HR 71 | Temp 98.1°F | Resp 20 | Wt 194.0 lb

## 2016-11-28 DIAGNOSIS — Z923 Personal history of irradiation: Secondary | ICD-10-CM | POA: Diagnosis not present

## 2016-11-28 DIAGNOSIS — Z79811 Long term (current) use of aromatase inhibitors: Secondary | ICD-10-CM | POA: Diagnosis not present

## 2016-11-28 DIAGNOSIS — C50612 Malignant neoplasm of axillary tail of left female breast: Secondary | ICD-10-CM | POA: Insufficient documentation

## 2016-11-28 DIAGNOSIS — Z170 Estrogen receptor positive status [ER+]: Secondary | ICD-10-CM | POA: Insufficient documentation

## 2016-11-28 NOTE — Progress Notes (Signed)
Radiation Oncology Follow up Note  Name: Cassidy Ortiz   Date:   11/28/2016 MRN:  [id number] DOB: [birth year]    This 74 y.o. female presents to the clinic today for 5 month follow-up status post whole breast radiation for stage I invasive mammary carcinoma the left breast.  REFERRING PROVIDER: Crecencio Mc, MD  HPI: Patient is a 75 year old female now out 5 months having completed whole breast radiation for stage I invasive mammary carcinoma left breast status post wide local excision and sentinel node biopsy. Tumor was ER/PR positive HER-2/neu negative.. Patient had adjuvant chemotherapy based on high Oncotype DX score. Seen today in routine follow-up she is doing well. She specifically denies breast tenderness cough or bone pain. Patient also has a history of ductal carcinoma of the right breast stage I also with history remotely of ration therapy. She is currently on Femara tolerating that well without side effect  COMPLICATIONS OF TREATMENT: none  FOLLOW UP COMPLIANCE: keeps appointments   PHYSICAL EXAM:  BP (!) 149/89   Pulse 71   Temp 98.1 F (36.7 C)   Resp 20   Wt 194 lb 0.1 oz (88 kg)   BMI 32.28 kg/m  Well-developed female in NAD. Right breast is somewhat more contracted than the left although no dominant mass or nodularity is noted in either breast in 2 positions examined. No axillary or supraclavicular adenopathy is identified. Well-developed well-nourished patient in NAD. HEENT reveals PERLA, EOMI, discs not visualized.  Oral cavity is clear. No oral mucosal lesions are identified. Neck is clear without evidence of cervical or supraclavicular adenopathy. Lungs are clear to A&P. Cardiac examination is essentially unremarkable with regular rate and rhythm without murmur rub or thrill. Abdomen is benign with no organomegaly or masses noted. Motor sensory and DTR levels are equal and symmetric in the upper and lower extremities. Cranial nerves II through XII are grossly intact.  Proprioception is intact. No peripheral adenopathy or edema is identified. No motor or sensory levels are noted. Crude visual fields are within normal range.  RADIOLOGY RESULTS: No current films for review mammograms have been ordered after the first of the  PLAN: At the present time she is doing well with no evidence of disease. I'm please were overall progress. I've asked to see her back in 6 months for follow-up. She continues on Femara without side effect. Patient knows to call sooner with any concerns.  I would like to take this opportunity to thank you for allowing me to participate in the care of your patient.Armstead Peaks., MD

## 2016-12-05 ENCOUNTER — Ambulatory Visit (INDEPENDENT_AMBULATORY_CARE_PROVIDER_SITE_OTHER): Payer: Medicare Other | Admitting: Cardiovascular Disease

## 2016-12-05 ENCOUNTER — Encounter: Payer: Self-pay | Admitting: Cardiovascular Disease

## 2016-12-05 VITALS — BP 140/98 | HR 76 | Ht 65.0 in | Wt 196.5 lb

## 2016-12-05 DIAGNOSIS — I160 Hypertensive urgency: Secondary | ICD-10-CM

## 2016-12-05 DIAGNOSIS — G4701 Insomnia due to medical condition: Secondary | ICD-10-CM

## 2016-12-05 DIAGNOSIS — Z170 Estrogen receptor positive status [ER+]: Secondary | ICD-10-CM

## 2016-12-05 DIAGNOSIS — I671 Cerebral aneurysm, nonruptured: Secondary | ICD-10-CM | POA: Diagnosis not present

## 2016-12-05 DIAGNOSIS — E782 Mixed hyperlipidemia: Secondary | ICD-10-CM | POA: Diagnosis not present

## 2016-12-05 DIAGNOSIS — I110 Hypertensive heart disease with heart failure: Secondary | ICD-10-CM

## 2016-12-05 DIAGNOSIS — I421 Obstructive hypertrophic cardiomyopathy: Secondary | ICD-10-CM

## 2016-12-05 DIAGNOSIS — C50612 Malignant neoplasm of axillary tail of left female breast: Secondary | ICD-10-CM

## 2016-12-05 DIAGNOSIS — I5031 Acute diastolic (congestive) heart failure: Secondary | ICD-10-CM | POA: Diagnosis not present

## 2016-12-05 DIAGNOSIS — Z982 Presence of cerebrospinal fluid drainage device: Secondary | ICD-10-CM

## 2016-12-05 DIAGNOSIS — F332 Major depressive disorder, recurrent severe without psychotic features: Secondary | ICD-10-CM

## 2016-12-05 NOTE — Patient Instructions (Addendum)
GOLD STAR!!!  Medication Instructions:   No medication changes made  Labwork:  No new labs needed  Testing/Procedures:  No further testing at this time   I recommend watching educational videos on topics of interest to you at:       [email or web address]  Enter code: [email or web address]    Follow-Up: It was a pleasure seeing you in the office today. Please call us if you have new issues that need to be addressed before your next appt.  [phone]  Your physician wants you to follow-up in: 6 months.  You will receive a reminder letter in the mail two months in advance. If you don't receive a letter, please call our office to schedule the follow-up appointment.  If you need a refill on your cardiac medications before your next appointment, please call your pharmacy.

## 2016-12-05 NOTE — Progress Notes (Signed)
Cardiology Office Note  Date:  12/05/2016   ID:  Cassidy Ortiz, DOB 1941-05-22, MRN [id number]  PCP:  Crecencio Mc, MD   Chief Complaint  Patient presents with  . other    32mo f/u. Pt states she is doing well. Reviewed meds with pt verbally.    HPI:  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  Admission to the hospital for severe hypertension, in hindsight felt secondary to severe pain after she received Neulasta shot. 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  history of right renal cell carcinoma.   Finished treatment for invasive ductal carcinoma of the left breast.  Completed 4 rounds of chemotherapy and radiation  Lab work reviewed with her in detail HBA1C 5.6 Total chol in 07/2014: 150, no recent cholesterol available  In general she reports that she is doing well, main problem is insomnia She is followed by Dr. Nicolasa Ducking, on several medications but they're not helping Reports she is active but no regular exercise program Blood pressure stable typically in the 308 systolic range Denies having labile numbers. Does not check her blood pressure on a regular basis, perhaps once per week  Since then she reports blood pressure has been well controlled, this morning is 657 systolic Husband had to hold several of her medications this morning She is scheduled for chemotherapy tomorrow, round #3 Of note she did have severe  pain, brief hypertension one week after chemotherapy rounds #1  EKG on today's visit shows normal sinus rhythm with rate 76 bpm, nonspecific ST abnormality  Past medical history went to the emergency room 04/03/2015 for severe hypertension Systolic pressures were greater than 200. compliance with her medication She was not anxious, was not in pain, denied any stressors that could have contributed to her high blood pressure they gave her hydralazine IV, gave her her regular afternoon medications  creatinine 1.49, BUN 31, hematocrit 40, EKG with normal sinus rhythm, rate 56 bpm, CT scan of the head with no acute findings  EGD on December 3 and had severe hypertension prior to the procedure. EGD showed small region of Barrett's esophagus After she got home, blood pressure seem to improve down to her baseline in the 130 range Went in for colonoscopy 11/18/2014 and again had severe hypertension Again after the procedure, went home and blood pressure significantly improved  Echocardiogram showed severe LVH, mild outflow tract gradient. No mention of elevated right ventricular systolic pressures. Previous Problems with anemia before requiring IM iron, now resolved Previous EGD showed ulcer, H. Pylori negative.  Earlier in 2013, we discontinued the diltiazem and Her edema resolved.  She reports that in followup today, she is depressed. Recently started on Remeron for sleep over the past several months. Uncertain if this is helping but sleep is okay. She's not doing any exercise. Very sedentary. Feels her blood pressure is doing well but is not checking any numbers. Weight continues to trend upwards  previous assessment/evaluation at [facility] for  history of aneurysms and shunt (leak?) showed that shunt is not working but it is not a problem. No recent carotid ultrasound  PMH:   has a past medical history of Abnormal weight gain; Acquired cyst of kidney; Anxiety state, unspecified; Baker's  cyst of knee; Breast cancer ([place]) (2001); Breast cancer of upper-inner quadrant of left female breast ([place]) (04/19/2016); Cerebral aneurysm rupture ([place]); Cerebral aneurysm, nonruptured (2008); Chronic diastolic CHF (congestive heart failure) ([place]); Chronic kidney disease, stage III (moderate); Depressive disorder, not elsewhere classified; Diverticulosis of colon (without mention of hemorrhage); Duodenal mass; Duodenal ulcer; Edema; Fatty liver; Grave's disease; HOCM (hypertrophic obstructive cardiomyopathy) ([place]); Hypertension; Hypertensive kidney disease, benign; Hypothyroid; Malignant neoplasm of breast (female), unspecified site (2001); Malignant neoplasm of kidney, except pelvis (2008); Mixed hyperlipidemia; Obstructive sleep apnea (adult) (pediatric); Personal history of colonic polyps (10/22/2002); Radiation (2001); Sleep apnea; Unspecified hypothyroidism; and Vitamin D deficiency.  PSH:    Past Surgical History:  Procedure Laterality Date  . BREAST BIOPSY Left 03/14/2016   stereo  . BREAST LUMPECTOMY Right    right  . CATARACT EXTRACTION     bilateral  . COLONOSCOPY     last 2012.+ TA polyps  . CORONARY STENT PLACEMENT     brain  . CSF SHUNT  08/17/2008  . FEMORAL ARTERY REPAIR  2011  . HEMIARTHROPLASTY SHOULDER FRACTURE     left  . PARTIAL NEPHRECTOMY     right  . RADIOACTIVE SEED GUIDED MASTECTOMY WITH AXILLARY SENTINEL LYMPH NODE BIOPSY Left 04/19/2016   Procedure: RADIOACTIVE SEED GUIDED PARTIAL MASTECTOMY WITH AXILLARY SENTINEL LYMPH NODE BIOPSY;  Surgeon: Fanny Skates, MD;  Location: [facility];  Service: General;  Laterality: Left;  . SHOULDER SURGERY     left  . UPPER GASTROINTESTINAL ENDOSCOPY  11-10-2014  . VAGINAL DELIVERY     3    Current Outpatient Prescriptions  Medication Sig Dispense Refill  . ALPRAZolam (XANAX) 0.25 MG tablet Take 1 tablet (0.25 mg total) by mouth 3 (three) times daily as needed for anxiety. 30 tablet 0  . aspirin 81 MG tablet  Take 162 mg by mouth daily at 12 noon. Take at noon.    Marland Kitchen atorvastatin (LIPITOR) 40 MG tablet TAKE 1 TABLET DAILY 90 tablet 3  . buPROPion (WELLBUTRIN SR) 100 MG 12 hr tablet Take 100 mg by mouth 2 (two) times daily.    . carvedilol (COREG) 12.5 MG tablet Take 1 tablet (12.5 mg total) by mouth 2 (two) times daily with a meal. 90 tablet 3  . cloNIDine (CATAPRES) 0.1 MG tablet Take 1 tablet (0.1 mg total) by mouth 3 (three) times daily. 270 tablet 3  . doxazosin (CARDURA) 4 MG tablet Take 4 mg by mouth daily.    Marland Kitchen esomeprazole (NEXIUM) 20 MG capsule Take 20 mg by mouth every morning.    . fesoterodine (TOVIAZ) 4 MG TB24 tablet Take 4 mg by mouth daily.    . hydrALAZINE (APRESOLINE) 100 MG tablet Take 1 tablet (100 mg total) by mouth 3 (three) times daily. 270 tablet 3  . Lactulose 20 GM/30ML SOLN 30 ml every 4 hours until constipation is relieved 236 mL 3  . letrozole (FEMARA) 2.5 MG tablet Take 1 tablet (2.5 mg total) by mouth daily. 90 tablet 12  . levothyroxine (SYNTHROID) 175 MCG tablet Take 1 tablet (175 mcg total) by mouth daily. 90 tablet 0  . lisinopril (PRINIVIL,ZESTRIL) 10 MG tablet Take 10 mg by mouth daily.    . Melatonin 5 MG  TABS Take by mouth.    . mirtazapine (REMERON SOL-TAB) 15 MG disintegrating tablet Take 1 tablet (15 mg total) by mouth at bedtime. (Patient taking differently: Take 45 mg by mouth at bedtime. ) 90 tablet 1  . Multiple Vitamin (MULTIVITAMIN) tablet Take 1 tablet by mouth daily.      . nitroGLYCERIN (NITROSTAT) 0.4 MG SL tablet Place 1 tablet (0.4 mg total) under the tongue every 5 (five) minutes as needed for chest pain. 25 tablet 3  . rOPINIRole (REQUIP) 1 MG tablet TAKE 1 TABLET (1 MG TOTAL) BY MOUTH DAILY AS NEEDED. 90 tablet 1  . zolpidem (AMBIEN) 10 MG tablet Take 10 mg by mouth at bedtime as needed for sleep.     No current facility-administered medications for this visit.    Facility-Administered Medications Ordered in Other Visits  Medication Dose Route  Frequency Provider Last Rate Last Dose  . 0.9 %  sodium chloride infusion   Intravenous Continuous Volanda Napoleon, MD   Stopped at 04/20/15 1651     Allergies:   Amlodipine   Social History:  The patient  reports that she quit smoking about 9 years ago. Her smoking use included Cigarettes. She started smoking about 52 years ago. She has a 43.00 pack-year smoking history. She has never used smokeless tobacco. She reports that she does not drink alcohol or use drugs.   Family History:   family history includes Cancer in her sister; Colon cancer in her sister; Heart disease in her father and mother.    Review of Systems: Review of Systems  Constitutional: Negative.   Respiratory: Negative.   Cardiovascular: Negative.   Gastrointestinal: Negative.   Musculoskeletal: Negative.   Neurological: Negative.   Psychiatric/Behavioral: Positive for depression. The patient has insomnia.   All other systems reviewed and are negative.    PHYSICAL EXAM: VS:  BP (!) 140/98 (BP Location: Right Arm, Patient Position: Sitting, Cuff Size: Normal)   Pulse 76   Ht 5\' 5"  (1.651 m)   Wt 196 lb 8 oz (89.1 kg)   BMI 32.70 kg/m  , BMI Body mass index is 32.7 kg/m. GEN: Well nourished, well developed, in no acute distress , obese HEENT: normal  Neck: no JVD, carotid bruits, or masses Cardiac: RRR; no murmurs, rubs, or gallops,no edema  Respiratory:  clear to auscultation bilaterally, normal work of breathing GI: soft, nontender, nondistended, + BS MS: no deformity or atrophy  Skin: warm and dry, no rash Neuro:  Strength and sensation are intact Psych: euthymic mood, full affect    Recent Labs: 06/25/2016: Magnesium 2.1 11/08/2016: ALT 11; BUN 35.7; Creatinine 1.4; HGB 11.6; Platelets 162; Potassium 3.9; Sodium 144    Lipid Panel Lab Results  Component Value Date   CHOL 150 07/13/2014   HDL 58.20 07/13/2014   LDLCALC 69 07/13/2014   TRIG 114.0 07/13/2014      Wt Readings from Last 3  Encounters:  12/05/16 196 lb 8 oz (89.1 kg)  11/28/16 194 lb 0.1 oz (88 kg)  11/08/16 196 lb (88.9 kg)       ASSESSMENT AND PLAN:  Hypertensive heart disease with heart failure ([place]) - Plan: EKG 12-Lead Blood pressure stable in the 140 range per the patient. Recommended she monitor her diastolic pressures before any medication changes made  Acute diastolic CHF (congestive heart failure) ([place]) Appears relatively euvolemic on today's visit. Currently not on diuretics Mixed hyperlipidemia Tolerating Lipitor Recommended she have repeat lipid panel  Cerebral aneurysm, nonruptured Previously  evaluated at [facility], felt not to be an issue associated with severe/labile hypertension  HOCM (hypertrophic obstructive cardiomyopathy) ([place]) Asymptomatic, continue beta blocker  Hypertensive urgency Previous episodes felt secondary to side effects from pain associated with Neulasta shot  S/P VP shunt Previously evaluated at [facility]  Insomnia due to medical condition Followed by psychiatry.  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 EKG 12-Lead     Signed, Esmond Plants, M.D., Ph.D. 12/05/2016  [place], [place]

## 2016-12-16 ENCOUNTER — Other Ambulatory Visit: Payer: Self-pay | Admitting: Internal Medicine

## 2016-12-16 DIAGNOSIS — E039 Hypothyroidism, unspecified: Secondary | ICD-10-CM

## 2016-12-16 DIAGNOSIS — I10 Essential (primary) hypertension: Secondary | ICD-10-CM

## 2016-12-17 NOTE — Telephone Encounter (Signed)
Refilled 10/01/16. Pt last seen 10/21/16. Pt last TSH 05/11/15. Please advise?

## 2016-12-18 NOTE — Telephone Encounter (Signed)
Patient has a appt with Dr.Tullo on 01/31 and requested to have her labs drawn on this day

## 2016-12-18 NOTE — Telephone Encounter (Signed)
Spoke with patient she has an appointment with her Oncologist and they will be able to do her TSH and CMET labs there since she has a port.

## 2016-12-18 NOTE — Telephone Encounter (Signed)
lmom for patient to call back to schedule a lab appointment

## 2016-12-18 NOTE — Telephone Encounter (Signed)
RefillE ED BUT SHE NEEDS TO COME IN FOR A  TSH   ASAP,  needed prior to any more refills

## 2016-12-20 ENCOUNTER — Ambulatory Visit (HOSPITAL_BASED_OUTPATIENT_CLINIC_OR_DEPARTMENT_OTHER): Payer: Medicare Other

## 2016-12-20 ENCOUNTER — Ambulatory Visit (HOSPITAL_BASED_OUTPATIENT_CLINIC_OR_DEPARTMENT_OTHER): Payer: Medicare Other | Admitting: Hematology & Oncology

## 2016-12-20 ENCOUNTER — Other Ambulatory Visit (HOSPITAL_BASED_OUTPATIENT_CLINIC_OR_DEPARTMENT_OTHER): Payer: Medicare Other

## 2016-12-20 ENCOUNTER — Other Ambulatory Visit: Payer: Self-pay | Admitting: Internal Medicine

## 2016-12-20 VITALS — BP 131/60 | HR 59 | Temp 98.2°F | Resp 20

## 2016-12-20 DIAGNOSIS — C50912 Malignant neoplasm of unspecified site of left female breast: Secondary | ICD-10-CM

## 2016-12-20 DIAGNOSIS — Z85528 Personal history of other malignant neoplasm of kidney: Secondary | ICD-10-CM | POA: Diagnosis not present

## 2016-12-20 DIAGNOSIS — Z170 Estrogen receptor positive status [ER+]: Secondary | ICD-10-CM

## 2016-12-20 DIAGNOSIS — T386X5A Adverse effect of antigonadotrophins, antiestrogens, antiandrogens, not elsewhere classified, initial encounter: Secondary | ICD-10-CM

## 2016-12-20 DIAGNOSIS — M818 Other osteoporosis without current pathological fracture: Secondary | ICD-10-CM

## 2016-12-20 DIAGNOSIS — E039 Hypothyroidism, unspecified: Secondary | ICD-10-CM | POA: Diagnosis not present

## 2016-12-20 DIAGNOSIS — Z79811 Long term (current) use of aromatase inhibitors: Secondary | ICD-10-CM | POA: Diagnosis not present

## 2016-12-20 DIAGNOSIS — D500 Iron deficiency anemia secondary to blood loss (chronic): Secondary | ICD-10-CM

## 2016-12-20 DIAGNOSIS — Z86000 Personal history of in-situ neoplasm of breast: Secondary | ICD-10-CM | POA: Diagnosis not present

## 2016-12-20 DIAGNOSIS — N3946 Mixed incontinence: Secondary | ICD-10-CM | POA: Diagnosis not present

## 2016-12-20 LAB — CMP (CANCER CENTER ONLY)
ALBUMIN: 3.1 g/dL — AB (ref 3.3–5.5)
ALT(SGPT): 24 U/L (ref 10–47)
AST: 24 U/L (ref 11–38)
Alkaline Phosphatase: 114 U/L — ABNORMAL HIGH (ref 26–84)
BUN: 36 mg/dL — AB (ref 7–22)
CHLORIDE: 108 meq/L (ref 98–108)
CO2: 25 meq/L (ref 18–33)
CREATININE: 1.5 mg/dL — AB (ref 0.6–1.2)
Calcium: 9.2 mg/dL (ref 8.0–10.3)
Glucose, Bld: 148 mg/dL — ABNORMAL HIGH (ref 73–118)
POTASSIUM: 3.8 meq/L (ref 3.3–4.7)
SODIUM: 142 meq/L (ref 128–145)
Total Bilirubin: 0.5 mg/dl (ref 0.20–1.60)
Total Protein: 6.2 g/dL — ABNORMAL LOW (ref 6.4–8.1)

## 2016-12-20 LAB — CBC WITH DIFFERENTIAL (CANCER CENTER ONLY)
BASO#: 0 10*3/uL (ref 0.0–0.2)
BASO%: 0.4 % (ref 0.0–2.0)
EOS ABS: 0.2 10*3/uL (ref 0.0–0.5)
EOS%: 2.8 % (ref 0.0–7.0)
HCT: 35.3 % (ref 34.8–46.6)
HGB: 11.8 g/dL (ref 11.6–15.9)
LYMPH#: 1 10*3/uL (ref 0.9–3.3)
LYMPH%: 17.7 % (ref 14.0–48.0)
MCH: 29.8 pg (ref 26.0–34.0)
MCHC: 33.4 g/dL (ref 32.0–36.0)
MCV: 89 fL (ref 81–101)
MONO#: 0.4 10*3/uL (ref 0.1–0.9)
MONO%: 6.6 % (ref 0.0–13.0)
NEUT#: 3.9 10*3/uL (ref 1.5–6.5)
NEUT%: 72.5 % (ref 39.6–80.0)
PLATELETS: 131 10*3/uL — AB (ref 145–400)
RBC: 3.96 10*6/uL (ref 3.70–5.32)
RDW: 14.4 % (ref 11.1–15.7)
WBC: 5.4 10*3/uL (ref 3.9–10.0)

## 2016-12-20 MED ORDER — SODIUM CHLORIDE 0.9 % IJ SOLN
10.0000 mL | INTRAMUSCULAR | Status: DC | PRN
  Administered 2016-12-20: 10 mL
  Filled 2016-12-20: qty 10

## 2016-12-20 MED ORDER — HEPARIN SOD (PORK) LOCK FLUSH 100 UNIT/ML IV SOLN
500.0000 [IU] | Freq: Once | INTRAVENOUS | Status: AC | PRN
  Administered 2016-12-20: 500 [IU]
  Filled 2016-12-20: qty 5

## 2016-12-20 NOTE — Progress Notes (Signed)
Hematology and Oncology Follow Up Visit  Cassidy Ortiz [id number] 17-Mar-1941 76 y.o. 12/20/2016   Principle Diagnosis:  1. 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 2. 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 3. Intermittent iron-deficiency anemia. 4.  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  Ortiz is back for followup. She looks tired. She feels tired. She says she cannot sleep. Her psychiatrist is adjusting her medications.  She does see a new family doctor. Dr. Derrel Nip is her new doctor. Cassidy Ortiz really likes her.  She is doing well with Femara. She's had no process with Femara. She is not complaining of any arthralgias.   She did have a nice holiday season. She had no problems over Christmas and New Year's.   She's had no cough or shortness of breath. She is gaining some weight. She is not too happy about this. She said that she cannot exercise because she is too tired.   Overall, her performance status is ECOG 1.  Medications:  Current Outpatient Prescriptions:  .  ALPRAZolam (XANAX) 0.25 MG tablet, Take 1 tablet (0.25 mg total) by mouth 3 (three) times daily as needed for anxiety., Disp: 30 tablet, Rfl: 0 .  aspirin 81 MG tablet, Take 162 mg by mouth daily at 12 noon. Take at noon., Disp: , Rfl:  .  atorvastatin (LIPITOR) 40 MG tablet, TAKE 1 TABLET DAILY, Disp: 90 tablet, Rfl: 3 .  buPROPion (WELLBUTRIN SR) 100 MG 12 hr tablet, Take 100 mg by mouth 2 (two) times daily., Disp: , Rfl:  .  carvedilol (COREG) 12.5 MG tablet, Take 1 tablet (12.5 mg total) by mouth 2 (two) times daily with a meal., Disp: 90 tablet, Rfl: 3 .  cloNIDine (CATAPRES) 0.1 MG tablet, Take 1 tablet (0.1 mg total) by mouth 3 (three) times daily.,  Disp: 270 tablet, Rfl: 3 .  doxazosin (CARDURA) 4 MG tablet, Take 4 mg by mouth daily., Disp: , Rfl:  .  esomeprazole (NEXIUM) 20 MG capsule, Take 20 mg by mouth every morning., Disp: , Rfl:  .  fesoterodine (TOVIAZ) 4 MG TB24 tablet, Take 4 mg by mouth daily., Disp: , Rfl:  .  hydrALAZINE (APRESOLINE) 100 MG tablet, Take 1 tablet (100 mg total) by mouth 3 (three) times daily., Disp: 270 tablet, Rfl: 3 .  letrozole (FEMARA) 2.5 MG tablet, Take 1 tablet (2.5 mg total) by mouth daily., Disp: 90 tablet, Rfl: 12 .  lisinopril (PRINIVIL,ZESTRIL) 10 MG tablet, Take 10 mg by mouth daily., Disp: , Rfl:  .  Melatonin 5 MG TABS, Take by mouth., Disp: , Rfl:  .  mirtazapine (REMERON SOL-TAB) 15 MG disintegrating tablet, Take 1 tablet (15 mg total) by mouth at bedtime. (Patient taking differently: Take 45 mg by mouth at bedtime. ), Disp: 90 tablet, Rfl: 1 .  Multiple Vitamin (MULTIVITAMIN) tablet, Take 1 tablet by mouth daily.  , Disp: , Rfl:  .  nitroGLYCERIN (NITROSTAT) 0.4 MG SL tablet, Place 1 tablet (0.4 mg total) under the tongue every 5 (five) minutes as needed for chest pain., Disp: 25 tablet, Rfl: 3 .  rOPINIRole (REQUIP) 1 MG tablet, TAKE 1 TABLET (1 MG TOTAL) BY  MOUTH DAILY AS NEEDED., Disp: 90 tablet, Rfl: 1 .  SYNTHROID 175 MCG tablet, TAKE 1 TABLET DAILY, Disp: 90 tablet, Rfl: 0 No current facility-administered medications for this visit.   Facility-Administered Medications Ordered in Other Visits:  .  0.9 %  sodium chloride infusion, , Intravenous, Continuous, Volanda Napoleon, MD, Stopped at 04/20/15 1651 .  sodium chloride 0.9 % injection 10 mL, 10 mL, Intracatheter, PRN, Volanda Napoleon, MD, 10 mL at 12/20/16 1200  Allergies:  Allergies  Allergen Reactions  . Amlodipine     Leg swelling    Past Medical History, Surgical history, Social history, and Family History were reviewed and updated.  Review of Systems: As above  Physical Exam:  oral temperature is 98.2 F (36.8 C). Her  blood pressure is 131/60 and her pulse is 59 (abnormal). Her respiration is 20 and oxygen saturation is 95%.   Well-developed and well-nourished white female. Head and neck exam shows no ocular or oral lesions. She has chemotherapy-induced alopecia. She has no palpable cervical or supraclavicular lymph nodes. Lungs are clear. Cardiac exam regular in rhythm with no murmurs rubs or bruits. Abdomen is soft. She is good bowel sounds. There is no fluid wave. There is no guarding or rebound tenderness. There is no palpable liver or spleen tip.extremities shows the compression stockings bilaterally. She has minimal edema in her legs. No erythema is noted. She good range of motion and strength. Skin exam no rashes. Neurological exam is non-focal. Breast exam shows left breast  with a healing lumpectomy scar at about 11:00 position. She has some erythema from radiation. There is no skin breakdown.  No distinct masses noted in the left breast. She has the healing left axillary sentinel node biopsy scar. Right breast shows contraction of the tissue. She has the nipple inversion which is chronic. She has some radiation changes. She has no obvious mass in the right breast. There is no right axillary adenopathy.   Lab Results  Component Value Date   WBC 5.4 12/20/2016   HGB 11.8 12/20/2016   HCT 35.3 12/20/2016   MCV 89 12/20/2016   PLT 131 (L) 12/20/2016     Chemistry      Component Value Date/Time   NA 142 12/20/2016 1153   NA 144 11/08/2016 1059   K 3.8 12/20/2016 1153   K 3.9 11/08/2016 1059   CL 108 12/20/2016 1153   CO2 25 12/20/2016 1153   CO2 24 11/08/2016 1059   BUN 36 (H) 12/20/2016 1153   BUN 35.7 (H) 11/08/2016 1059   CREATININE 1.5 (H) 12/20/2016 1153   CREATININE 1.4 (H) 11/08/2016 1059      Component Value Date/Time   CALCIUM 9.2 12/20/2016 1153   CALCIUM 9.4 11/08/2016 1059   ALKPHOS 114 (H) 12/20/2016 1153   ALKPHOS 101 11/08/2016 1059   AST 24 12/20/2016 1153   AST 13 11/08/2016  1059   ALT 24 12/20/2016 1153   ALT 11 11/08/2016 1059   BILITOT 0.50 12/20/2016 1153   BILITOT 0.33 11/08/2016 1059         Impression and Plan: Cassidy Ortiz is 76 year old female. 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 She has a past history of right renal cell carcinoma.  She now has a new invasive ductal carcinoma of the left breast. Because of the high Oncotype score, we really needed to embark upon chemotherapy.  She has completed all chemotherapy. She completed this in August 2017. She  really had a tough time with treatment.  She then had radiation therapy. She treated this in October.  We started her on Femara in December. So far, she is doing well with this.  I just feel bad that she cannot sleep all that well. I'm not sure as to why she cannot sleep. Is possible she is on some medications that this could be affecting her. She will talk to her new family doctor about this. She really likes Dr. Derrel Nip.  I will see her back in 3 months. She will have her Port-A-Cath flushed in 6 weeks. Hopefully this can be done at the [facility] which is a lot closer to where she lives.  Volanda Napoleon, MD 1/12/20181:30 PM

## 2016-12-24 LAB — TSH: TSH: 1.38 u[IU]/mL (ref 0.450–4.500)

## 2016-12-25 ENCOUNTER — Encounter: Payer: Self-pay | Admitting: Internal Medicine

## 2017-01-08 ENCOUNTER — Ambulatory Visit: Payer: Medicare Other | Admitting: Internal Medicine

## 2017-01-08 ENCOUNTER — Telehealth: Payer: Self-pay | Admitting: Internal Medicine

## 2017-01-08 NOTE — Telephone Encounter (Signed)
FYI - Pt called and cancelled appt. Pt stated that she just woke up and that she didn't sleep all night. PT r/s for 3/7 @ 11am.

## 2017-01-08 NOTE — Telephone Encounter (Signed)
Pt called back and r/s appt for 2/2/ @ 8 am

## 2017-01-10 ENCOUNTER — Encounter: Payer: Self-pay | Admitting: Internal Medicine

## 2017-01-10 ENCOUNTER — Ambulatory Visit (INDEPENDENT_AMBULATORY_CARE_PROVIDER_SITE_OTHER): Payer: Medicare Other | Admitting: Internal Medicine

## 2017-01-10 DIAGNOSIS — K5904 Chronic idiopathic constipation: Secondary | ICD-10-CM | POA: Diagnosis not present

## 2017-01-10 DIAGNOSIS — I10 Essential (primary) hypertension: Secondary | ICD-10-CM

## 2017-01-10 DIAGNOSIS — I421 Obstructive hypertrophic cardiomyopathy: Secondary | ICD-10-CM | POA: Diagnosis not present

## 2017-01-10 DIAGNOSIS — G4733 Obstructive sleep apnea (adult) (pediatric): Secondary | ICD-10-CM | POA: Diagnosis not present

## 2017-01-10 DIAGNOSIS — G4701 Insomnia due to medical condition: Secondary | ICD-10-CM | POA: Diagnosis not present

## 2017-01-10 DIAGNOSIS — F332 Major depressive disorder, recurrent severe without psychotic features: Secondary | ICD-10-CM | POA: Diagnosis not present

## 2017-01-10 MED ORDER — ALPRAZOLAM 0.25 MG PO TABS: 0.2500 mg | ORAL_TABLET | Freq: Three times a day (TID) | ORAL | 0 refills | Status: DC | PRN

## 2017-01-10 NOTE — Progress Notes (Signed)
Subjective:  Patient ID: Cassidy Ortiz, female    DOB: [birth year]  Age: 76 y.o. MRN: [id number]  CC: Diagnoses of Insomnia due to medical condition, Essential hypertension, HOCM (hypertrophic obstructive cardiomyopathy) ([place]), Chronic idiopathic constipation, Major depressive disorder, recurrent, severe without psychotic features ([place]), and OSA (obstructive sleep apnea) were pertinent to this visit.  HPI Cassidy Ortiz presents for follow up on multiple issues including hypertension and impaired fasting glucose and  IPG and anxiety with insomnia, managed with ambien and alprazolam.  Her insomnia did remeron, ,she has also tried trazodone.  She has not had any ambien in the last 2 months.      Now on temazeapm for the past 2 weeks prescribed by Dr Nicolasa Ducking.  . Still having trouble maintaining sleep.  .  Snores,  Has OSA but cannot tolerate machine due to anxiety and a persistent  facial rash from the rubber seal.     Lab Results  Component Value Date   HGBA1C 5.6 11/08/2016    Started on letrazole by [employer].    Outpatient Medications Prior to Visit  Medication Sig Dispense Refill  . aspirin 81 MG tablet Take 162 mg by mouth daily at 12 noon. Take at noon.    Marland Kitchen atorvastatin (LIPITOR) 40 MG tablet TAKE 1 TABLET DAILY 90 tablet 3  . buPROPion (WELLBUTRIN SR) 100 MG 12 hr tablet Take 100 mg by mouth 2 (two) times daily.    . carvedilol (COREG) 12.5 MG tablet Take 1 tablet (12.5 mg total) by mouth 2 (two) times daily with a meal. 90 tablet 3  . cloNIDine (CATAPRES) 0.1 MG tablet Take 1 tablet (0.1 mg total) by mouth 3 (three) times daily. 270 tablet 3  . doxazosin (CARDURA) 4 MG tablet Take 4 mg by mouth daily.    Marland Kitchen esomeprazole (NEXIUM) 20 MG capsule Take 20 mg by mouth every morning.    . fesoterodine (TOVIAZ) 4 MG TB24 tablet Take 4 mg by mouth daily.    . hydrALAZINE (APRESOLINE) 100 MG tablet Take 1 tablet (100 mg total) by mouth 3 (three) times daily. 270 tablet 3  . letrozole (FEMARA)  2.5 MG tablet Take 1 tablet (2.5 mg total) by mouth daily. 90 tablet 12  . lisinopril (PRINIVIL,ZESTRIL) 10 MG tablet Take 10 mg by mouth daily.    . Melatonin 5 MG TABS Take by mouth.    . mirtazapine (REMERON SOL-TAB) 15 MG disintegrating tablet Take 1 tablet (15 mg total) by mouth at bedtime. (Patient taking differently: Take 45 mg by mouth at bedtime. ) 90 tablet 1  . Multiple Vitamin (MULTIVITAMIN) tablet Take 1 tablet by mouth daily.      . nitroGLYCERIN (NITROSTAT) 0.4 MG SL tablet Place 1 tablet (0.4 mg total) under the tongue every 5 (five) minutes as needed for chest pain. 25 tablet 3  . rOPINIRole (REQUIP) 1 MG tablet TAKE 1 TABLET (1 MG TOTAL) BY MOUTH DAILY AS NEEDED. 90 tablet 1  . SYNTHROID 175 MCG tablet TAKE 1 TABLET DAILY 90 tablet 0  . ALPRAZolam (XANAX) 0.25 MG tablet Take 1 tablet (0.25 mg total) by mouth 3 (three) times daily as needed for anxiety. 30 tablet 0   Facility-Administered Medications Prior to Visit  Medication Dose Route Frequency Provider Last Rate Last Dose  . 0.9 %  sodium chloride infusion   Intravenous Continuous Volanda Napoleon, MD   Stopped at 04/20/15 1651    Review of Systems;  Patient denies headache, fevers, malaise, unintentional weight  loss, skin rash, eye pain, sinus congestion and sinus pain, sore throat, dysphagia,  hemoptysis , cough, dyspnea, wheezing, chest pain, palpitations, orthopnea, edema, abdominal pain, nausea, melena, diarrhea, constipation, flank pain, dysuria, hematuria, urinary  Frequency, nocturia, numbness, tingling, seizures,  Focal weakness, Loss of consciousness,  Tremor, insomnia, depression, anxiety, and suicidal ideation.      Objective:  BP 134/82   Pulse (!) 59   Temp 98.5 F (36.9 C) (Oral)   Resp 16   Wt 200 lb (90.7 kg)   SpO2 92%   BMI 33.28 kg/m   BP Readings from Last 3 Encounters:  01/10/17 134/82  12/20/16 131/60  12/05/16 (!) 140/98    Wt Readings from Last 3 Encounters:  01/10/17 200 lb (90.7  kg)  12/05/16 196 lb 8 oz (89.1 kg)  11/28/16 194 lb 0.1 oz (88 kg)    General appearance: alert, cooperative and appears stated age Ears: normal TM's and external ear canals both ears Throat: lips, mucosa, and tongue normal; teeth and gums normal Neck: no adenopathy, no carotid bruit, supple, symmetrical, trachea midline and thyroid not enlarged, symmetric, no tenderness/mass/nodules Back: symmetric, no curvature. ROM normal. No CVA tenderness. Lungs: clear to auscultation bilaterally Heart: regular rate and rhythm, S1, S2 normal, no murmur, click, rub or gallop Abdomen: soft, non-tender; bowel sounds normal; no masses,  no organomegaly Pulses: 2+ and symmetric Skin: Skin color, texture, turgor normal. No rashes or lesions Lymph nodes: Cervical, supraclavicular, and axillary nodes normal. Neuro:  awake and interactive with normal mood and affect. Higher cortical functions are normal. Speech is clear without word-finding difficulty or dysarthria. Extraocular movements are intact. Visual fields of both eyes are grossly intact. Sensation to light touch is grossly intact bilaterally of upper and lower extremities. Motor examination shows 4+/5 symmetric hand grip and upper extremity and 5/5 lower extremity strength. There is no pronation or drift. Gait is non-ataxic      Lab Results  Component Value Date   HGBA1C 5.6 11/08/2016    Lab Results  Component Value Date   CREATININE 1.5 (H) 12/20/2016   CREATININE 1.4 (H) 11/08/2016   CREATININE 1.5 (H) 08/28/2016    Lab Results  Component Value Date   WBC 5.4 12/20/2016   HGB 11.8 12/20/2016   HCT 35.3 12/20/2016   PLT 131 (L) 12/20/2016   GLUCOSE 148 (H) 12/20/2016   CHOL 150 07/13/2014   TRIG 114.0 07/13/2014   HDL 58.20 07/13/2014   LDLCALC 69 07/13/2014   ALT 24 12/20/2016   AST 24 12/20/2016   NA 142 12/20/2016   K 3.8 12/20/2016   CL 108 12/20/2016   CREATININE 1.5 (H) 12/20/2016   BUN 36 (H) 12/20/2016   CO2 25  12/20/2016   TSH 1.380 12/20/2016   INR 1.03 05/22/2016   HGBA1C 5.6 11/08/2016    No results found.  Assessment & Plan:   Problem List Items Addressed This Visit    Constipation    Chronic,  Recommend adding citrucel/miralax and stool softener to daily regimen.       HOCM (hypertrophic obstructive cardiomyopathy) ([place])    Last ECHO noted LVH ,  Ef 60 to 65% June 2017      Hypertension (Chronic)    Not well controlled currently,  Increase dose of lisinopril to 20 mg daily      Insomnia    Ishe is aware of the dangers of taking his drug as an elderly patient.  She has been [place] dependent for over  two years, and prior attempts to reduce dose have been unsuccessful resulting in loss of sleep. Dr Nicolasa Ducking refilled her ambien on nov 16 for 90 days. Sleep hygiene rules reviewed,  She has been watching television in bed for hours.       Major depressive disorder, recurrent, severe without psychotic features ([place])    Improved with management by Dr. Nicolasa Ducking with wellbutrin and remeron      Relevant Medications   ALPRAZolam (XANAX) 0.25 MG tablet   OSA (obstructive sleep apnea)    Untreated due to mask intolerance          I am having Ms. Piche maintain her aspirin, multivitamin, rOPINIRole, atorvastatin, buPROPion, esomeprazole, carvedilol, mirtazapine, lisinopril, doxazosin, cloNIDine, hydrALAZINE, fesoterodine, nitroGLYCERIN, letrozole, Melatonin, SYNTHROID, temazepam, and ALPRAZolam.  Meds ordered this encounter  Medications  . temazepam (RESTORIL) 15 MG capsule    Sig: Take 15 mg by mouth at bedtime as needed.    Refill:  0  . ALPRAZolam (XANAX) 0.25 MG tablet    Sig: Take 1 tablet (0.25 mg total) by mouth 3 (three) times daily as needed for anxiety.    Dispense:  30 tablet    Refill:  0    Medications Discontinued During This Encounter  Medication Reason  . ALPRAZolam (XANAX) 0.25 MG tablet Reorder    Follow-up: Return in about 6 months (around  07/10/2017).   Crecencio Mc, MD

## 2017-01-10 NOTE — Progress Notes (Signed)
Pre visit review using our clinic review tool, if applicable. No additional management support is needed unless otherwise documented below in the visit note. 

## 2017-01-10 NOTE — Patient Instructions (Addendum)
I want to increase the lisinopril to 20 mg daily for goal blood pressure  Of  120/70   For constipation  You can use or or boh of the faolliwng types of medication sdaily  Colace (stool softener)   Try mixing  A little miralax  With the usual dose of citrucel   Go for a walk for 15 minutes 3 times per week.   Turn the tv off  30 minutes before bedtime.  All blue screens 2 hours before bedtitime  Use old fashioned books when you can't sleep

## 2017-01-12 NOTE — Assessment & Plan Note (Signed)
Improved with management by Dr. Nicolasa Ducking with wellbutrin and remeron

## 2017-01-12 NOTE — Assessment & Plan Note (Signed)
Untreated due to mask intolerance

## 2017-01-12 NOTE — Assessment & Plan Note (Signed)
Chronic,  Recommend adding citrucel/miralax and stool softener to daily regimen.

## 2017-01-12 NOTE — Assessment & Plan Note (Signed)
Not well controlled currently,  Increase dose of lisinopril to 20 mg daily

## 2017-01-12 NOTE — Assessment & Plan Note (Addendum)
Ishe is aware of the dangers of taking his drug as an elderly patient.  She has been [place] dependent for over two years, and prior attempts to reduce dose have been unsuccessful resulting in loss of sleep. Dr Nicolasa Ducking refilled her ambien on nov 16 for 90 days. Sleep hygiene rules reviewed,  She has been watching television in bed for hours.

## 2017-01-12 NOTE — Assessment & Plan Note (Signed)
Last ECHO noted LVH ,  Ef 60 to 65% June 2017

## 2017-01-14 DIAGNOSIS — F332 Major depressive disorder, recurrent severe without psychotic features: Secondary | ICD-10-CM | POA: Diagnosis not present

## 2017-01-14 DIAGNOSIS — G4700 Insomnia, unspecified: Secondary | ICD-10-CM | POA: Diagnosis not present

## 2017-01-23 DIAGNOSIS — Z961 Presence of intraocular lens: Secondary | ICD-10-CM | POA: Diagnosis not present

## 2017-01-23 DIAGNOSIS — H35033 Hypertensive retinopathy, bilateral: Secondary | ICD-10-CM | POA: Diagnosis not present

## 2017-01-23 DIAGNOSIS — H04123 Dry eye syndrome of bilateral lacrimal glands: Secondary | ICD-10-CM | POA: Diagnosis not present

## 2017-01-23 DIAGNOSIS — H26493 Other secondary cataract, bilateral: Secondary | ICD-10-CM | POA: Diagnosis not present

## 2017-01-30 ENCOUNTER — Other Ambulatory Visit: Payer: Self-pay

## 2017-01-31 ENCOUNTER — Inpatient Hospital Stay: Payer: Medicare Other

## 2017-02-07 ENCOUNTER — Inpatient Hospital Stay: Payer: Medicare Other | Attending: Radiation Oncology

## 2017-02-07 ENCOUNTER — Other Ambulatory Visit: Payer: Self-pay | Admitting: Cardiovascular Disease

## 2017-02-07 DIAGNOSIS — Z452 Encounter for adjustment and management of vascular access device: Secondary | ICD-10-CM | POA: Insufficient documentation

## 2017-02-07 DIAGNOSIS — Z95828 Presence of other vascular implants and grafts: Secondary | ICD-10-CM

## 2017-02-07 DIAGNOSIS — Z170 Estrogen receptor positive status [ER+]: Secondary | ICD-10-CM | POA: Diagnosis not present

## 2017-02-07 DIAGNOSIS — C50911 Malignant neoplasm of unspecified site of right female breast: Secondary | ICD-10-CM | POA: Diagnosis not present

## 2017-02-07 MED ORDER — SODIUM CHLORIDE 0.9% FLUSH
10.0000 mL | INTRAVENOUS | Status: DC | PRN
  Administered 2017-02-07: 10 mL via INTRAVENOUS
  Filled 2017-02-07: qty 10

## 2017-02-07 MED ORDER — HEPARIN SOD (PORK) LOCK FLUSH 100 UNIT/ML IV SOLN
500.0000 [IU] | Freq: Once | INTRAVENOUS | Status: AC
  Administered 2017-02-07: 500 [IU] via INTRAVENOUS

## 2017-02-12 ENCOUNTER — Encounter: Payer: Self-pay | Admitting: Family

## 2017-02-12 ENCOUNTER — Ambulatory Visit: Payer: Medicare Other | Admitting: Internal Medicine

## 2017-02-12 ENCOUNTER — Telehealth: Payer: Self-pay | Admitting: Internal Medicine

## 2017-02-12 ENCOUNTER — Ambulatory Visit (INDEPENDENT_AMBULATORY_CARE_PROVIDER_SITE_OTHER): Payer: Medicare Other | Admitting: Family

## 2017-02-12 VITALS — BP 132/80 | HR 56 | Temp 98.1°F | Ht 65.0 in | Wt 198.8 lb

## 2017-02-12 DIAGNOSIS — R21 Rash and other nonspecific skin eruption: Secondary | ICD-10-CM | POA: Diagnosis not present

## 2017-02-12 NOTE — Progress Notes (Signed)
Pre visit review using our clinic review tool, if applicable. No additional management support is needed unless otherwise documented below in the visit note. 

## 2017-02-12 NOTE — Progress Notes (Signed)
Subjective:    Patient ID: Cassidy Ortiz, female    DOB: 09-16-1941, 76 y.o.   MRN: [id number]  CC: Cassidy Ortiz is a 76 y.o. female who presents today for an acute visit.    HPI: CC: facial redness, x one week. Comes and goes. Accompanied by son. Leaving tomorrow for [place] to see sister who has been placed in hospice. Nontender, not itchy.   No fever, N, V. Overall feels well. No new medication. Nothing tried for rash. Triggered by shower. .   Similar episode a few years ago which resolved when stopped using cipap machine. No new creams.   H/o HTN- took medications today. No CP, SOB.          HISTORY:  Past Medical History:  Diagnosis Date  . Abnormal weight gain   . Acquired cyst of kidney   . Anxiety state, unspecified   . Baker's cyst of knee    Left, pt does not remember  . Breast cancer ([place]) 2001   RT LUMPECTOMY  . Breast cancer of upper-inner quadrant of left female breast ([place]) 04/19/2016  . Cerebral aneurysm rupture ([facility])    stent  . Cerebral aneurysm, nonruptured 2008   s/p coil and shunt, performed in [place]  . Chronic diastolic CHF (congestive heart failure) ([place])    a. 05/2016 Echo: EF 60-65%, no rwma, mild AI, nl RV fxn, mildly dil LA.  Marland Kitchen Chronic kidney disease, stage III (moderate)   . Depressive disorder, not elsewhere classified   . Diverticulosis of colon (without mention of hemorrhage)   . Duodenal mass   . Duodenal ulcer   . Edema   . Fatty liver   . Grave's disease   . HOCM (hypertrophic obstructive cardiomyopathy) ([place])    a. 06/2012 Echo: EF 65-70%, sev LVH w/ dynamic obstruction, Gr1 DD, mild AI, mildly dil LA;  b. 05/2016 Echo: EF 60-65%, mild LVH, no rwma.  . Hypertension   . Hypertensive kidney disease, benign   . Hypothyroid   . Malignant neoplasm of breast (female), unspecified site 2001   right, s/p lumpectomy and XRT  . Malignant neoplasm of kidney, except pelvis 2008   s/p partial nephrectomy  . Mixed hyperlipidemia   . Obstructive sleep  apnea (adult) (pediatric)   . Personal history of colonic polyps 10/22/2002   hyperplastic   . Radiation 2001   BREAST CA  . Sleep apnea    off cpap  . Unspecified hypothyroidism   . Vitamin D deficiency    Past Surgical History:  Procedure Laterality Date  . BREAST BIOPSY Left 03/14/2016   stereo  . BREAST LUMPECTOMY Right    right  . CATARACT EXTRACTION     bilateral  . COLONOSCOPY     last 2012.+ TA polyps  . CORONARY STENT PLACEMENT     brain  . CSF SHUNT  08/17/2008  . FEMORAL ARTERY REPAIR  2011  . HEMIARTHROPLASTY SHOULDER FRACTURE     left  . PARTIAL NEPHRECTOMY     right  . RADIOACTIVE SEED GUIDED MASTECTOMY WITH AXILLARY SENTINEL LYMPH NODE BIOPSY Left 04/19/2016   Procedure: RADIOACTIVE SEED GUIDED PARTIAL MASTECTOMY WITH AXILLARY SENTINEL LYMPH NODE BIOPSY;  Surgeon: Fanny Skates, MD;  Location: [facility];  Service: General;  Laterality: Left;  . SHOULDER SURGERY     left  . UPPER GASTROINTESTINAL ENDOSCOPY  11-10-2014  . VAGINAL DELIVERY     3   Family History  Problem Relation Age of Onset  .  Colon cancer Sister   . Cancer Sister     colon  . Heart disease Father   . Heart disease Mother   . Esophageal cancer Neg Hx   . Rectal cancer Neg Hx   . Stomach cancer Neg Hx     Allergies: Amlodipine Current Outpatient Prescriptions on File Prior to Visit  Medication Sig Dispense Refill  . aspirin 81 MG tablet Take 162 mg by mouth daily at 12 noon. Take at noon.    Marland Kitchen atorvastatin (LIPITOR) 40 MG tablet TAKE 1 TABLET DAILY 90 tablet 3  . buPROPion (WELLBUTRIN SR) 100 MG 12 hr tablet Take 100 mg by mouth 2 (two) times daily.    . carvedilol (COREG) 12.5 MG tablet Take 1 tablet (12.5 mg total) by mouth 2 (two) times daily with a meal. 90 tablet 3  . carvedilol (COREG) 12.5 MG tablet TAKE 1 TABLET TWICE A DAY  WITH MEALS. 180 tablet 3  . cloNIDine (CATAPRES) 0.1 MG tablet Take 1 tablet (0.1 mg total) by mouth 3 (three) times daily. 270 tablet 3    . doxazosin (CARDURA) 4 MG tablet Take 4 mg by mouth daily.    Marland Kitchen esomeprazole (NEXIUM) 20 MG capsule Take 20 mg by mouth every morning.    . fesoterodine (TOVIAZ) 4 MG TB24 tablet Take 4 mg by mouth daily.    . hydrALAZINE (APRESOLINE) 100 MG tablet Take 1 tablet (100 mg total) by mouth 3 (three) times daily. 270 tablet 3  . letrozole (FEMARA) 2.5 MG tablet Take 1 tablet (2.5 mg total) by mouth daily. 90 tablet 12  . lisinopril (PRINIVIL,ZESTRIL) 10 MG tablet Take 10 mg by mouth daily.    . Melatonin 5 MG TABS Take by mouth.    . mirtazapine (REMERON SOL-TAB) 15 MG disintegrating tablet Take 1 tablet (15 mg total) by mouth at bedtime. (Patient taking differently: Take 45 mg by mouth at bedtime. ) 90 tablet 1  . mirtazapine (REMERON) 45 MG tablet Take 45 mg by mouth at bedtime.    . Multiple Vitamin (MULTIVITAMIN) tablet Take 1 tablet by mouth daily.      . nitroGLYCERIN (NITROSTAT) 0.4 MG SL tablet Place 1 tablet (0.4 mg total) under the tongue every 5 (five) minutes as needed for chest pain. 25 tablet 3  . rOPINIRole (REQUIP) 1 MG tablet TAKE 1 TABLET (1 MG TOTAL) BY MOUTH DAILY AS NEEDED. 90 tablet 1  . SYNTHROID 175 MCG tablet TAKE 1 TABLET DAILY 90 tablet 0  . temazepam (RESTORIL) 15 MG capsule Take 15 mg by mouth at bedtime as needed.  0   Current Facility-Administered Medications on File Prior to Visit  Medication Dose Route Frequency Provider Last Rate Last Dose  . 0.9 %  sodium chloride infusion   Intravenous Continuous Volanda Napoleon, MD   Stopped at 04/20/15 1651    Social History  Substance Use Topics  . Smoking status: Former Smoker    Packs/day: 1.00    Years: 43.00    Types: Cigarettes    Start date: 04/01/1964    Quit date: 11/24/2007  . Smokeless tobacco: Never Used     Comment: quit smoking 7 years ago  . Alcohol use No    Review of Systems  Constitutional: Negative for chills and fever.  Respiratory: Negative for cough.   Cardiovascular: Negative for chest pain  and palpitations.  Gastrointestinal: Negative for nausea and vomiting.      Objective:    BP 132/80  Pulse (!) 56   Temp 98.1 F (36.7 C) (Oral)   Ht 5\' 5"  (1.651 m)   Wt 198 lb 12.8 oz (90.2 kg)   SpO2 92%   BMI 33.08 kg/m    Physical Exam  Constitutional: She appears well-developed and well-nourished.  Eyes: Conjunctivae are normal.  Cardiovascular: Normal rate, regular rhythm, normal heart sounds and normal pulses.   Pulmonary/Chest: Effort normal and breath sounds normal. She has no wheezes. She has no rhonchi. She has no rales.  Neurological: She is alert.  Skin: Skin is warm and dry.  Telangiectasias over brow of nose. Erythema between brow. No papules.   Psychiatric: She has a normal mood and affect. Her speech is normal and behavior is normal. Thought content normal.  Vitals reviewed.        Assessment & Plan:   1. Rash Working diagnoses rosacea as triggered getting out of shower and recent stress. Low clinical suspicion for butterfly rash is rash extends to the upper brow; doesn't really follow the classic 'butterfly' pattern. Advised patient to closely monitor this and to even send a picture via chart if rash reappears. Advised behavioral management.     I am having Ms. Schulte maintain her aspirin, multivitamin, rOPINIRole, atorvastatin, buPROPion, esomeprazole, carvedilol, mirtazapine, lisinopril, doxazosin, cloNIDine, hydrALAZINE, fesoterodine, nitroGLYCERIN, letrozole, Melatonin, SYNTHROID, temazepam, mirtazapine, and carvedilol.   No orders of the defined types were placed in this encounter.   Return precautions given.   Risks, benefits, and alternatives of the medications and treatment plan prescribed today were discussed, and patient expressed understanding.   Education regarding symptom management and diagnosis given to patient on AVS.  Continue to follow with TULLO, Aris Everts, MD for routine health maintenance.   [name] and I agreed with  plan.   Mable Paris, FNP

## 2017-02-12 NOTE — Telephone Encounter (Signed)
Patient came into facility with C/O fascial flushing and redness, patient was triaged and the follow ing vitals taken T98.1, 02 Sat @ 92% on room air, pulse 56 ,BP 160/90 . Patient was to leave town tomorrow to see sister on hospice care and with patient HX of cancer and HX Cerebral aneurism, scheduled patient with NP today at 11.30 .

## 2017-02-12 NOTE — Patient Instructions (Signed)
Suspect rocaea  L;et me know if worsens while in [place].   Rosacea Rosacea is a long-term (chronic) condition that affects the skin of the face, including the cheeks, nose, brow, and chin. This condition can also affect the eyes. Rosacea causes blood vessels near the surface of the skin to enlarge, which results in redness. What are the causes? The cause of this condition is not known. Certain triggers can make rosacea worse, including:  Hot baths.  Exercise.  Sunlight.  Very hot or cold temperatures.  Hot or spicy foods and drinks.  Drinking alcohol.  Stress.  Taking blood pressure medicine.  Long-term use of topical steroids on the face. What increases the risk? This condition is more likely to develop in:  People who are older than 74 years of age.  Women.  People who have light-colored skin (light complexion).  People who have a family history of rosacea. What are the signs or symptoms? Symptoms of this condition include:  Redness of the face.  Red bumps or pimples on the face.  A red, enlarged nose.  Blushing easily.  Red lines on the skin.  Irritated or burning feeling in the eyes.  Swollen eyelids. How is this diagnosed? This condition is diagnosed with a medical history and physical exam. How is this treated? There is no cure for this condition, but treatment can help to control your symptoms. Your health care provider may recommend that you see a skin specialist (dermatologist). Treatment may include:  Antibiotic medicines that are applied to the skin or taken as a pill.  Laser treatment to improve the appearance of the skin.  Surgery. This is rare. Your health care provider will also recommend the best way to take care of your skin. Even after your skin improves, you will likely need to continue treatment to prevent your rosacea from coming back. Follow these instructions at home: Skin Care  Take care of your skin as told by your health care  provider. You may be told to do these things:  Wash your skin gently two or more times each day.  Use mild soap.  Use a sunscreen or sunblock with SPF 30 or greater.  Use gentle cosmetics that are meant for sensitive skin.  Shave with an electric shaver instead of a blade. Lifestyle   Try to keep track of what foods trigger this condition. Avoid any triggers. These may include:  Spicy foods.  Seafood.  Cheese.  Hot liquids.  Nuts.  Chocolate.  Iodized salt.  Do not drink alcohol.  Avoid extremely cold or hot temperatures.  Try to reduce your stress. If you need help, talk with your health care provider.  When you exercise, do these things to stay cool:  Limit your sun exposure.  Use a fan.  Do shorter and more frequent intervals of exercise. General instructions   Keep all follow-up visits as told by your health care provider. This is important.  Take over-the-counter and prescription medicines only as told by your health care provider.  If your eyelids are affected, apply warm compresses to them. Do this as told by your health care provider.  If you were prescribed an antibiotic medicine, apply or take it as told by your health care provider. Do not stop using the antibiotic even if your condition improves. Contact a health care provider if:  Your symptoms get worse.  Your symptoms do not improve after two months of treatment.  You have new symptoms.  You have any changes  in vision or you have problems with your eyes, such as redness or itching.  You feel depressed.  You lose your appetite.  You have trouble concentrating. This information is not intended to replace advice given to you by your health care provider. Make sure you discuss any questions you have with your health care provider. Document Released: 01/02/2005 Document Revised: 05/02/2016 Document Reviewed: 02/01/2015 Elsevier Interactive Patient Education  2017 [employer].

## 2017-03-04 ENCOUNTER — Other Ambulatory Visit: Payer: Self-pay | Admitting: Internal Medicine

## 2017-03-04 DIAGNOSIS — E039 Hypothyroidism, unspecified: Secondary | ICD-10-CM

## 2017-03-21 ENCOUNTER — Other Ambulatory Visit: Payer: Self-pay | Admitting: *Deleted

## 2017-03-21 ENCOUNTER — Ambulatory Visit (HOSPITAL_BASED_OUTPATIENT_CLINIC_OR_DEPARTMENT_OTHER): Payer: Medicare Other

## 2017-03-21 ENCOUNTER — Encounter: Payer: Self-pay | Admitting: Hematology & Oncology

## 2017-03-21 ENCOUNTER — Ambulatory Visit (HOSPITAL_BASED_OUTPATIENT_CLINIC_OR_DEPARTMENT_OTHER): Payer: Medicare Other | Admitting: Hematology & Oncology

## 2017-03-21 ENCOUNTER — Other Ambulatory Visit (HOSPITAL_BASED_OUTPATIENT_CLINIC_OR_DEPARTMENT_OTHER): Payer: Medicare Other

## 2017-03-21 VITALS — BP 176/80 | HR 66 | Temp 98.3°F | Resp 16 | Ht 65.0 in | Wt 199.0 lb

## 2017-03-21 DIAGNOSIS — Z79811 Long term (current) use of aromatase inhibitors: Secondary | ICD-10-CM | POA: Diagnosis not present

## 2017-03-21 DIAGNOSIS — Z170 Estrogen receptor positive status [ER+]: Secondary | ICD-10-CM | POA: Diagnosis not present

## 2017-03-21 DIAGNOSIS — Z85528 Personal history of other malignant neoplasm of kidney: Secondary | ICD-10-CM

## 2017-03-21 DIAGNOSIS — T386X5A Adverse effect of antigonadotrophins, antiestrogens, antiandrogens, not elsewhere classified, initial encounter: Secondary | ICD-10-CM

## 2017-03-21 DIAGNOSIS — M818 Other osteoporosis without current pathological fracture: Secondary | ICD-10-CM | POA: Diagnosis not present

## 2017-03-21 DIAGNOSIS — C50912 Malignant neoplasm of unspecified site of left female breast: Secondary | ICD-10-CM | POA: Diagnosis not present

## 2017-03-21 DIAGNOSIS — Z86000 Personal history of in-situ neoplasm of breast: Secondary | ICD-10-CM

## 2017-03-21 LAB — CBC WITH DIFFERENTIAL (CANCER CENTER ONLY)
BASO#: 0 10*3/uL (ref 0.0–0.2)
BASO%: 0.5 % (ref 0.0–2.0)
EOS ABS: 0.2 10*3/uL (ref 0.0–0.5)
EOS%: 3.8 % (ref 0.0–7.0)
HCT: 36.8 % (ref 34.8–46.6)
HGB: 12.1 g/dL (ref 11.6–15.9)
LYMPH#: 1 10*3/uL (ref 0.9–3.3)
LYMPH%: 21.4 % (ref 14.0–48.0)
MCH: 30.3 pg (ref 26.0–34.0)
MCHC: 32.9 g/dL (ref 32.0–36.0)
MCV: 92 fL (ref 81–101)
MONO#: 0.3 10*3/uL (ref 0.1–0.9)
MONO%: 7.7 % (ref 0.0–13.0)
NEUT#: 3 10*3/uL (ref 1.5–6.5)
NEUT%: 66.6 % (ref 39.6–80.0)
PLATELETS: 140 10*3/uL — AB (ref 145–400)
RBC: 4 10*6/uL (ref 3.70–5.32)
RDW: 14.6 % (ref 11.1–15.7)
WBC: 4.4 10*3/uL (ref 3.9–10.0)

## 2017-03-21 LAB — CMP (CANCER CENTER ONLY)
ALBUMIN: 3.5 g/dL (ref 3.3–5.5)
ALT(SGPT): 20 U/L (ref 10–47)
AST: 20 U/L (ref 11–38)
Alkaline Phosphatase: 92 U/L — ABNORMAL HIGH (ref 26–84)
BUN, Bld: 32 mg/dL — ABNORMAL HIGH (ref 7–22)
CHLORIDE: 107 meq/L (ref 98–108)
CO2: 28 meq/L (ref 18–33)
CREATININE: 1.6 mg/dL — AB (ref 0.6–1.2)
Calcium: 9.6 mg/dL (ref 8.0–10.3)
GLUCOSE: 93 mg/dL (ref 73–118)
Potassium: 3.9 mEq/L (ref 3.3–4.7)
SODIUM: 145 meq/L (ref 128–145)
Total Bilirubin: 0.5 mg/dl (ref 0.20–1.60)
Total Protein: 6.1 g/dL — ABNORMAL LOW (ref 6.4–8.1)

## 2017-03-21 MED ORDER — LIDOCAINE-PRILOCAINE 2.5-2.5 % EX CREA: 1.0000 "application " | TOPICAL_CREAM | CUTANEOUS | 3 refills | Status: DC | PRN

## 2017-03-21 MED ORDER — SODIUM CHLORIDE 0.9% FLUSH
10.0000 mL | INTRAVENOUS | Status: DC | PRN
  Filled 2017-03-21: qty 10

## 2017-03-21 MED ORDER — HEPARIN SOD (PORK) LOCK FLUSH 100 UNIT/ML IV SOLN
500.0000 [IU] | Freq: Once | INTRAVENOUS | Status: AC
  Administered 2017-03-21: 500 [IU] via INTRAVENOUS
  Filled 2017-03-21: qty 5

## 2017-03-21 NOTE — Progress Notes (Signed)
Hematology and Oncology Follow Up Visit  Cassidy Ortiz [id number] 25-Jan-1941 76 y.o. 03/21/2017   Principle Diagnosis:  1. 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 2. 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 3. Intermittent iron-deficiency anemia. 4.  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  Bribiesca is back for followup. Overall, she is doing pretty well. She had a nice Easter. She and her husband spend it by themselves.  She unfortunately has a sister in [place] who is dying of cancer. It sounds like she has cervical cancer. There are some issues that I try to help Ms. Crom with. Overall, it sounds like her sister really needs hospice. I told Ms. Taliercio that her sister or her family can call hospice if the doctor is not willing to.   She is not had any problems with nausea or vomiting. There's been no change in bowel or bladder habits. She is sleeping a little bit better.  She has had no rashes. She has had no leg swelling.   She has a Barrett's esophagus. She sees her gastroenterologist next week. I told her that this is very important to monitor. She does not want this turning into esophageal cancer.   With the warmer weather, I think she is walking a little bit more.  Overall, her performance status is ECOG 1.  Medications:  Current Outpatient Prescriptions:  .  aspirin 81 MG tablet, Take 162 mg by mouth daily at 12 noon. Take at noon., Disp: , Rfl:  .  atorvastatin (LIPITOR) 40 MG tablet, TAKE 1 TABLET DAILY, Disp: 90 tablet, Rfl: 3 .  buPROPion (WELLBUTRIN SR) 100 MG 12 hr tablet, Take 100 mg by mouth 2 (two) times daily., Disp: , Rfl:  .  carvedilol (COREG) 12.5 MG tablet, TAKE 1 TABLET TWICE A DAY  WITH MEALS.,  Disp: 180 tablet, Rfl: 3 .  cloNIDine (CATAPRES) 0.1 MG tablet, Take 1 tablet (0.1 mg total) by mouth 3 (three) times daily., Disp: 270 tablet, Rfl: 3 .  doxazosin (CARDURA) 4 MG tablet, Take 4 mg by mouth daily., Disp: , Rfl:  .  esomeprazole (NEXIUM) 20 MG capsule, Take 20 mg by mouth every morning., Disp: , Rfl:  .  fesoterodine (TOVIAZ) 4 MG TB24 tablet, Take 4 mg by mouth daily., Disp: , Rfl:  .  hydrALAZINE (APRESOLINE) 100 MG tablet, Take 1 tablet (100 mg total) by mouth 3 (three) times daily., Disp: 270 tablet, Rfl: 3 .  letrozole (FEMARA) 2.5 MG tablet, Take 1 tablet (2.5 mg total) by mouth daily., Disp: 90 tablet, Rfl: 12 .  lidocaine-prilocaine (EMLA) cream, Apply 1 application topically as needed., Disp: 30 g, Rfl: 3 .  lisinopril (PRINIVIL,ZESTRIL) 10 MG tablet, Take 10 mg by mouth daily., Disp: , Rfl:  .  Melatonin 5 MG TABS, Take by mouth., Disp: , Rfl:  .  mirtazapine (REMERON) 45 MG tablet, Take 45 mg by mouth at bedtime., Disp: , Rfl:  .  Multiple Vitamin (MULTIVITAMIN) tablet, Take 1 tablet by mouth daily.  , Disp: , Rfl:  .  nitroGLYCERIN (NITROSTAT) 0.4 MG SL tablet, Place 1 tablet (0.4 mg total) under the tongue every 5 (five) minutes  as needed for chest pain., Disp: 25 tablet, Rfl: 3 .  rOPINIRole (REQUIP) 1 MG tablet, TAKE 1 TABLET (1 MG TOTAL) BY MOUTH DAILY AS NEEDED., Disp: 90 tablet, Rfl: 1 .  SYNTHROID 175 MCG tablet, TAKE 1 TABLET DAILY        (THYROID STIMULATING       HORMONE NEEDED PRIOR TO    ANY MORE REFILLS), Disp: 90 tablet, Rfl: 1 .  temazepam (RESTORIL) 15 MG capsule, Take 15 mg by mouth at bedtime as needed., Disp: , Rfl: 0 No current facility-administered medications for this visit.   Facility-Administered Medications Ordered in Other Visits:  .  0.9 %  sodium chloride infusion, , Intravenous, Continuous, Volanda Napoleon, MD, Stopped at 04/20/15 1651 .  sodium chloride flush (NS) 0.9 % injection 10 mL, 10 mL, Intravenous, PRN, Volanda Napoleon,  MD  Allergies:  Allergies  Allergen Reactions  . Amlodipine     Leg swelling    Past Medical History, Surgical history, Social history, and Family History were reviewed and updated.  Review of Systems: As above  Physical Exam:  height is 5' 5" (1.651 m) and weight is 199 lb (90.3 kg). Her oral temperature is 98.3 F (36.8 C). Her blood pressure is 176/80 (abnormal) and her pulse is 66. Her respiration is 16.   Well-developed and well-nourished white female. Head and neck exam shows no ocular or oral lesions. She has chemotherapy-induced alopecia. She has no palpable cervical or supraclavicular lymph nodes. Lungs are clear. Cardiac exam regular in rhythm with no murmurs rubs or bruits. Abdomen is soft. She is good bowel sounds. There is no fluid wave. There is no guarding or rebound tenderness. There is no palpable liver or spleen tip.extremities shows the compression stockings bilaterally. She has minimal edema in her legs. No erythema is noted. She good range of motion and strength. Skin exam no rashes. Neurological exam is non-focal. Breast exam shows left breast  with a healing lumpectomy scar at about 11:00 position. She has some erythema from radiation. There is no skin breakdown.  No distinct masses noted in the left breast. She has the healing left axillary sentinel node biopsy scar. Right breast shows contraction of the tissue. She has the nipple inversion which is chronic. She has some radiation changes. She has no obvious mass in the right breast. There is no right axillary adenopathy.   Lab Results  Component Value Date   WBC 4.4 03/21/2017   HGB 12.1 03/21/2017   HCT 36.8 03/21/2017   MCV 92 03/21/2017   PLT 140 (L) 03/21/2017     Chemistry      Component Value Date/Time   NA 142 12/20/2016 1153   NA 144 11/08/2016 1059   K 3.8 12/20/2016 1153   K 3.9 11/08/2016 1059   CL 108 12/20/2016 1153   CO2 25 12/20/2016 1153   CO2 24 11/08/2016 1059   BUN 36 (H) 12/20/2016  1153   BUN 35.7 (H) 11/08/2016 1059   CREATININE 1.5 (H) 12/20/2016 1153   CREATININE 1.4 (H) 11/08/2016 1059      Component Value Date/Time   CALCIUM 9.2 12/20/2016 1153   CALCIUM 9.4 11/08/2016 1059   ALKPHOS 114 (H) 12/20/2016 1153   ALKPHOS 101 11/08/2016 1059   AST 24 12/20/2016 1153   AST 13 11/08/2016 1059   ALT 24 12/20/2016 1153   ALT 11 11/08/2016 1059   BILITOT 0.50 12/20/2016 1153   BILITOT 0.33 11/08/2016 1059  Impression and Plan: Ms. Lewing is 76 year old female. 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 She has a past history of right renal cell carcinoma.  She now has a new invasive ductal carcinoma of the left breast. Because of the high Oncotype score, we really needed to embark upon chemotherapy.  She has completed all chemotherapy. She completed this in August 2017. She really had a tough time with treatment.  She then had radiation therapy. She completed this in October 2017.  We started her on Femara in December. So far, she is doing well with this.  She asked me about Nexium and renal failure. I'm not aware of Nexium causing kidney failure. I think Nexium and that category of an acid, can cause some bone loss.  I think we can get her back in another 3 months.   Hopefully, she and her husband will be able to go on a little vacation.   Volanda Napoleon, MD 4/13/20181:18 PM

## 2017-03-21 NOTE — Patient Instructions (Signed)
Implanted Port Insertion, Care After °This sheet gives you information about how to care for yourself after your procedure. Your health care provider may also give you more specific instructions. If you have problems or questions, contact your health care provider. °What can I expect after the procedure? °After your procedure, it is common to have: °· Discomfort at the port insertion site. °· Bruising on the skin over the port. This should improve over 3-4 days. ° °Follow these instructions at home: °Port care °· After your port is placed, you will get a manufacturer's information card. The card has information about your port. Keep this card with you at all times. °· Take care of the port as told by your health care provider. Ask your health care provider if you or a family member can get training for taking care of the port at home. A home health care nurse may also take care of the port. °· Make sure to remember what type of port you have. °Incision care °· Follow instructions from your health care provider about how to take care of your port insertion site. Make sure you: °? Wash your hands with soap and water before you change your bandage (dressing). If soap and water are not available, use hand sanitizer. °? Change your dressing as told by your health care provider. °? Leave stitches (sutures), skin glue, or adhesive strips in place. These skin closures may need to stay in place for 2 weeks or longer. If adhesive strip edges start to loosen and curl up, you may trim the loose edges. Do not remove adhesive strips completely unless your health care provider tells you to do that. °· Check your port insertion site every day for signs of infection. Check for: °? More redness, swelling, or pain. °? More fluid or blood. °? Warmth. °? Pus or a bad smell. °General instructions °· Do not take baths, swim, or use a hot tub until your health care provider approves. °· Do not lift anything that is heavier than 10 lb (4.5  kg) for a week, or as told by your health care provider. °· Ask your health care provider when it is okay to: °? Return to work or school. °? Resume usual physical activities or sports. °· Do not drive for 24 hours if you were given a medicine to help you relax (sedative). °· Take over-the-counter and prescription medicines only as told by your health care provider. °· Wear a medical alert bracelet in case of an emergency. This will tell any health care providers that you have a port. °· Keep all follow-up visits as told by your health care provider. This is important. °Contact a health care provider if: °· You cannot flush your port with saline as directed, or you cannot draw blood from the port. °· You have a fever or chills. °· You have more redness, swelling, or pain around your port insertion site. °· You have more fluid or blood coming from your port insertion site. °· Your port insertion site feels warm to the touch. °· You have pus or a bad smell coming from the port insertion site. °Get help right away if: °· You have chest pain or shortness of breath. °· You have bleeding from your port that you cannot control. °Summary °· Take care of the port as told by your health care provider. °· Change your dressing as told by your health care provider. °· Keep all follow-up visits as told by your health care provider. °  This information is not intended to replace advice given to you by your health care provider. Make sure you discuss any questions you have with your health care provider. °Document Released: 09/15/2013 Document Revised: 10/16/2016 Document Reviewed: 10/16/2016 °Elsevier Interactive Patient Education © 2017 [employer] ° °

## 2017-03-24 LAB — VITAMIN D 25 HYDROXY (VIT D DEFICIENCY, FRACTURES): Vitamin D, 25-Hydroxy: 46.9 ng/mL (ref 30.0–100.0)

## 2017-03-25 ENCOUNTER — Ambulatory Visit (INDEPENDENT_AMBULATORY_CARE_PROVIDER_SITE_OTHER): Payer: Medicare Other | Admitting: Gastroenterology

## 2017-03-25 ENCOUNTER — Encounter: Payer: Self-pay | Admitting: Gastroenterology

## 2017-03-25 VITALS — BP 124/80 | HR 60 | Ht 65.0 in | Wt 198.0 lb

## 2017-03-25 DIAGNOSIS — K219 Gastro-esophageal reflux disease without esophagitis: Secondary | ICD-10-CM | POA: Diagnosis not present

## 2017-03-25 DIAGNOSIS — Z8719 Personal history of other diseases of the digestive system: Secondary | ICD-10-CM

## 2017-03-25 MED ORDER — RANITIDINE HCL 150 MG PO CAPS: 150.0000 mg | ORAL_CAPSULE | Freq: Two times a day (BID) | ORAL | 3 refills | Status: DC

## 2017-03-25 NOTE — Progress Notes (Signed)
03/25/2017 Cassidy Ortiz [id number] 05/09/1941   HISTORY OF PRESENT ILLNESS:  This is a pleasant 76 year old female who is previously known to Dr. Deatra Ina.  Her care will now be assumed by Dr. Ardis Hughs.  She has long-standing GERD and Barrett's esophagus.  She last colonoscopy was 11/2014 at which time there was severe diverticulosis in the descending and sigmoid colon.  Due to family history of colon cancer in her sister it was recommended that she have another colonoscopy in 5 years from that time.  She had an EGD in 11/2014 as well that showed a short segment of Barrett's esophagus and an early stricture at the GE junction; biopsies showed acute and chronically inflamed mucosa with intestinal metaplasia.  Repeat EGD due 11/2017.  She is here today to discuss her Nexium medication.  She is on Nexium 20 mg once daily and says that she feels fine taking it, no reflux symptoms.  She is concerned, however, because she has been on the medication so long and has recently been seeing issues on TV about the long-term effects of the medication.     Past Medical History:  Diagnosis Date  . Abnormal weight gain   . Acquired cyst of kidney   . Anxiety state, unspecified   . Baker's cyst of knee    Left, pt does not remember  . Breast cancer ([facility]) 2001   RT LUMPECTOMY  . Breast cancer of upper-inner quadrant of left female breast ([facility]) 04/19/2016  . Cerebral aneurysm rupture ([facility])    stent  . Cerebral aneurysm, nonruptured 2008   s/p coil and shunt, performed in [place]  . Chronic diastolic CHF (congestive heart failure) ([facility])    a. 05/2016 Echo: EF 60-65%, no rwma, mild AI, nl RV fxn, mildly dil LA.  Marland Kitchen Chronic kidney disease, stage III (moderate)   . Depressive disorder, not elsewhere classified   . Diverticulosis of colon (without mention of hemorrhage)   . Duodenal mass   . Duodenal ulcer   . Edema   . Fatty liver   . Grave's disease   . HOCM (hypertrophic obstructive cardiomyopathy) ([facility])    a.  06/2012 Echo: EF 65-70%, sev LVH w/ dynamic obstruction, Gr1 DD, mild AI, mildly dil LA;  b. 05/2016 Echo: EF 60-65%, mild LVH, no rwma.  . Hypertension   . Hypertensive kidney disease, benign   . Hypothyroid   . Malignant neoplasm of breast (female), unspecified site 2001   right, s/p lumpectomy and XRT  . Malignant neoplasm of kidney, except pelvis 2008   s/p partial nephrectomy  . Mixed hyperlipidemia   . Obstructive sleep apnea (adult) (pediatric)   . Personal history of colonic polyps 10/22/2002   hyperplastic   . Radiation 2001   BREAST CA  . Sleep apnea    off cpap  . Unspecified hypothyroidism   . Vitamin D deficiency    Past Surgical History:  Procedure Laterality Date  . BREAST BIOPSY Left 03/14/2016   stereo  . BREAST LUMPECTOMY Right    right  . CATARACT EXTRACTION     bilateral  . COLONOSCOPY     last 2012.+ TA polyps  . CORONARY STENT PLACEMENT     brain  . CSF SHUNT  08/17/2008  . FEMORAL ARTERY REPAIR  2011  . HEMIARTHROPLASTY SHOULDER FRACTURE     left  . PARTIAL NEPHRECTOMY     right  . RADIOACTIVE SEED GUIDED MASTECTOMY WITH AXILLARY SENTINEL LYMPH NODE BIOPSY Left 04/19/2016  Procedure: RADIOACTIVE SEED GUIDED PARTIAL MASTECTOMY WITH AXILLARY SENTINEL LYMPH NODE BIOPSY;  Surgeon: Fanny Skates, MD;  Location: [facility];  Service: General;  Laterality: Left;  . SHOULDER SURGERY     left  . UPPER GASTROINTESTINAL ENDOSCOPY  11-10-2014  . VAGINAL DELIVERY     3    reports that she quit smoking about 9 years ago. Her smoking use included Cigarettes. She started smoking about 53 years ago. She has a 43.00 pack-year smoking history. She has never used smokeless tobacco. She reports that she does not drink alcohol or use drugs. family history includes Cancer in her sister; Colon cancer in her sister; Heart disease in her father and mother. Allergies  Allergen Reactions  . Amlodipine     Leg swelling      Outpatient Encounter Prescriptions  as of 03/25/2017  Medication Sig  . aspirin 81 MG tablet Take 162 mg by mouth daily at 12 noon. Take at noon.  Marland Kitchen atorvastatin (LIPITOR) 40 MG tablet TAKE 1 TABLET DAILY  . buPROPion (WELLBUTRIN SR) 100 MG 12 hr tablet Take 100 mg by mouth 2 (two) times daily.  . carvedilol (COREG) 12.5 MG tablet TAKE 1 TABLET TWICE A DAY  WITH MEALS.  . cloNIDine (CATAPRES) 0.1 MG tablet Take 1 tablet (0.1 mg total) by mouth 3 (three) times daily.  Marland Kitchen doxazosin (CARDURA) 4 MG tablet Take 4 mg by mouth daily.  Marland Kitchen esomeprazole (NEXIUM) 20 MG capsule Take 20 mg by mouth every morning.  . fesoterodine (TOVIAZ) 4 MG TB24 tablet Take 4 mg by mouth daily.  . hydrALAZINE (APRESOLINE) 100 MG tablet Take 1 tablet (100 mg total) by mouth 3 (three) times daily.  Marland Kitchen letrozole (FEMARA) 2.5 MG tablet Take 1 tablet (2.5 mg total) by mouth daily.  Marland Kitchen lidocaine-prilocaine (EMLA) cream Apply 1 application topically as needed.  Marland Kitchen lisinopril (PRINIVIL,ZESTRIL) 10 MG tablet Take 10 mg by mouth daily.  . Melatonin 5 MG TABS Take by mouth.  . mirtazapine (REMERON) 45 MG tablet Take 45 mg by mouth at bedtime.  . Multiple Vitamin (MULTIVITAMIN) tablet Take 1 tablet by mouth daily.    . nitroGLYCERIN (NITROSTAT) 0.4 MG SL tablet Place 1 tablet (0.4 mg total) under the tongue every 5 (five) minutes as needed for chest pain.  Marland Kitchen rOPINIRole (REQUIP) 1 MG tablet TAKE 1 TABLET (1 MG TOTAL) BY MOUTH DAILY AS NEEDED.  Marland Kitchen SYNTHROID 175 MCG tablet TAKE 1 TABLET DAILY        (THYROID STIMULATING       HORMONE NEEDED PRIOR TO    ANY MORE REFILLS)  . temazepam (RESTORIL) 15 MG capsule Take 15 mg by mouth at bedtime as needed.   Facility-Administered Encounter Medications as of 03/25/2017  Medication  . 0.9 %  sodium chloride infusion     REVIEW OF SYSTEMS  : All other systems reviewed and negative except where noted in the History of Present Illness.   PHYSICAL EXAM: Ht 5\' 5"  (1.651 m)   Wt 198 lb (89.8 kg)   BMI 32.95 kg/m  General: Well  developed white female in no acute distress Head: Normocephalic and atraumatic Eyes:  Sclerae anicteric, conjunctiva pink. Ears: Normal auditory acuity Lungs: Clear throughout to auscultation Heart: Regular rate and rhythm Abdomen: Soft, non-distended.  Normal bowel sounds.  Non-tender. Musculoskeletal: Symmetrical with no gross deformities  Skin: No lesions on visible extremities Extremities: No edema  Neurological: Alert oriented x 4, grossly non-focal Psychological:  Alert and cooperative.  Normal mood and affect  ASSESSMENT AND PLAN: -GERD/Barrett's esophagus:  Patient is due for surveillance EGD in 11/2017.  She has been on Nexium for a long time and is concerned about the long-term consequences of taking that medication.  I advised her that she does need to be on some type of medication for her reflux.  We discussed this issue and she will try Zantac 150 mg BID for a while to see if this keeps her reflux symptoms under good control.  If she starts having symptoms again then may need to go back to PPI, Nexium.    CC:  Crecencio Mc, MD

## 2017-03-25 NOTE — Progress Notes (Signed)
I agree with the above note, plan 

## 2017-03-25 NOTE — Patient Instructions (Addendum)
We have sent the following medications to your pharmacy for you to pick up at your convenience: Zantac  Follow up endoscopy in December.  You will get a reminder to call.  If the Zantac does not help please schedule a follow up.  Normal BMI (Body Mass Index- based on height and weight) is between 23 and 30. Your BMI today is Body mass index is 32.95 kg/m. Marland Kitchen Please consider follow up  regarding your BMI with your Primary Care Provider.  Thank you

## 2017-04-10 DIAGNOSIS — G4700 Insomnia, unspecified: Secondary | ICD-10-CM | POA: Diagnosis not present

## 2017-04-10 DIAGNOSIS — F332 Major depressive disorder, recurrent severe without psychotic features: Secondary | ICD-10-CM | POA: Diagnosis not present

## 2017-05-09 ENCOUNTER — Ambulatory Visit: Payer: Medicare Other

## 2017-05-15 ENCOUNTER — Ambulatory Visit
Admission: RE | Admit: 2017-05-15 | Discharge: 2017-05-15 | Disposition: A | Discharge status: Home / Self Care | Payer: Medicare Other | Source: Ambulatory Visit | Attending: Radiation Oncology | Admitting: Radiation Oncology

## 2017-05-15 ENCOUNTER — Encounter: Payer: Self-pay | Admitting: Radiation Oncology

## 2017-05-15 VITALS — BP 149/88 | HR 66 | Temp 97.0°F | Resp 20 | Wt 199.4 lb

## 2017-05-15 DIAGNOSIS — Z170 Estrogen receptor positive status [ER+]: Secondary | ICD-10-CM | POA: Diagnosis not present

## 2017-05-15 DIAGNOSIS — Z79811 Long term (current) use of aromatase inhibitors: Secondary | ICD-10-CM | POA: Insufficient documentation

## 2017-05-15 DIAGNOSIS — C50612 Malignant neoplasm of axillary tail of left female breast: Secondary | ICD-10-CM

## 2017-05-15 DIAGNOSIS — Z923 Personal history of irradiation: Secondary | ICD-10-CM | POA: Insufficient documentation

## 2017-05-15 NOTE — Progress Notes (Signed)
Radiation Oncology Follow up Note  Name: Cassidy Ortiz   Date:   05/15/2017 MRN:  [id number] DOB: 09/08/1941    This 76 y.o. female presents to the clinic today for six-month follow-up status post whole breast radiation to her left breast for stage I invasive mammary carcinoma.  REFERRING PROVIDER: Crecencio Mc, MD  HPI: Patient is a 76 year old female now out 6 months having completed radiation therapy to her left breast for stage I invasive mammary carcinoma ER/PR positive. She also had adjuvant chemotherapy based on the high Oncotype DX score. She is seen today in routine follow-up and is doing well. She specifically denies breast tenderness cough or bone pain.. She is not had mammograms will be seeing her medical oncologist in the next several weeks and anticipate 1 being ordered at that time. She is currently on Femara tolerating that well without side effect  COMPLICATIONS OF TREATMENT: none  FOLLOW UP COMPLIANCE: keeps appointments   PHYSICAL EXAM:  BP (!) 149/88   Pulse 66   Temp 97 F (36.1 C)   Resp 20   Wt 199 lb 6.5 oz (90.5 kg)   BMI 33.18 kg/m  Lungs are clear to A&P cardiac examination essentially unremarkable with regular rate and rhythm. No dominant mass or nodularity is noted in either breast in 2 positions examined. Incision is well-healed. No axillary or supraclavicular adenopathy is appreciated. Cosmetic result is excellent. There is some retraction of the right breast although cosmetic result is still good. Well-developed well-nourished patient in NAD. HEENT reveals PERLA, EOMI, discs not visualized.  Oral cavity is clear. No oral mucosal lesions are identified. Neck is clear without evidence of cervical or supraclavicular adenopathy. Lungs are clear to A&P. Cardiac examination is essentially unremarkable with regular rate and rhythm without murmur rub or thrill. Abdomen is benign with no organomegaly or masses noted. Motor sensory and DTR levels are equal and  symmetric in the upper and lower extremities. Cranial nerves II through XII are grossly intact. Proprioception is intact. No peripheral adenopathy or edema is identified. No motor or sensory levels are noted. Crude visual fields are within normal range.  RADIOLOGY RESULTS: No current films for review  PLAN: At the present time she is doing well with no evidence of disease. I anticipate medical oncology ordering follow-up mammograms. She continues on Femara without side effect. I've asked to see her back in 6 months for follow-up and then will start once your follow-up visits. Patient is to call sooner with any concerns.  I would like to take this opportunity to thank you for allowing me to participate in the care of your patient.Armstead Peaks., MD

## 2017-05-16 ENCOUNTER — Other Ambulatory Visit: Payer: Self-pay

## 2017-05-16 MED ORDER — ROPINIROLE HCL 1 MG PO TABS: ORAL_TABLET | ORAL | 0 refills | Status: DC

## 2017-05-16 NOTE — Telephone Encounter (Signed)
Medication has been sent to correct pharmacy. 

## 2017-05-16 NOTE — Addendum Note (Signed)
Addended byElpidio Galea on: 05/16/2017 03:19 PM   Modules accepted: Orders

## 2017-05-16 NOTE — Telephone Encounter (Signed)
Pt husband came into follow up on pt medication. Pt has enough for tonight. Please advise?  Pharmacy is [id number] - [place], [place]   Call husband @ [phone]. Thank you!

## 2017-05-20 ENCOUNTER — Other Ambulatory Visit: Payer: Self-pay

## 2017-05-20 MED ORDER — ATORVASTATIN CALCIUM 40 MG PO TABS: 40.0000 mg | ORAL_TABLET | Freq: Every day | ORAL | 0 refills | Status: DC

## 2017-05-26 ENCOUNTER — Ambulatory Visit (INDEPENDENT_AMBULATORY_CARE_PROVIDER_SITE_OTHER): Payer: Medicare Other | Admitting: Urology

## 2017-05-26 ENCOUNTER — Encounter: Payer: Self-pay | Admitting: Urology

## 2017-05-26 VITALS — BP 148/80 | HR 62 | Ht 67.0 in | Wt 198.0 lb

## 2017-05-26 DIAGNOSIS — R32 Unspecified urinary incontinence: Secondary | ICD-10-CM

## 2017-05-26 DIAGNOSIS — N3946 Mixed incontinence: Secondary | ICD-10-CM | POA: Diagnosis not present

## 2017-05-26 MED ORDER — TOLTERODINE TARTRATE ER 4 MG PO CP24: 4.0000 mg | ORAL_CAPSULE | Freq: Every day | ORAL | 11 refills | Status: DC

## 2017-05-26 NOTE — Progress Notes (Signed)
05/26/2017 9:58 AM   [name] 1941/02/21 [id number]  Referring provider: Crecencio Mc, MD [place], [place] [address]  Chief Complaint  Patient presents with  . Urinary Incontinence    HPI: Dr Tresa Moore (2016): The patient had mixed incontinence and had had urodynamics in 2016. The patient had a partial right nephrectomy for renal cell carcinoma.    Today Patient is continent during the day with reduced frequency. She does not have bedwetting. 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 The Lisbeth Ply works great. It is $90 per month.. I could not remember my notes from [facility] and she could not recall other medications  I reviewed my [place] note. She had low pressure bladder overactivity on urodynamics and wished triggering. She had mild to moderate stress incontinence. Her primary problem was urgency incontinence with mild stress incontinence. I was going to consider neuromodulation therapy versus sling and I would require to take the stress component. She is having memory issues. I was concerned that her overactive bladder could persist after a sling due to her CNS issues. She had minimal response to trospium. The higher dose Toviaz cause dry mouth. She just went through breast cancer and chemotherapy. I believe she had side effects from Vesicare  PMH: Past Medical History:  Diagnosis Date  . Abnormal weight gain   . Acquired cyst of kidney   . Anxiety state, unspecified   . Baker's cyst of knee    Left, pt does not remember  . Breast cancer ([place]) 2001   RT LUMPECTOMY  . Breast cancer of upper-inner quadrant of left female breast ([place]) 04/19/2016  . Cerebral aneurysm rupture (HCC)    stent  . Cerebral aneurysm, nonruptured 2008   s/p coil and shunt, performed in [place]  . Chronic diastolic CHF (congestive heart failure) ([place])    a. 05/2016 Echo: EF 60-65%, no rwma, mild AI, nl RV fxn, mildly dil LA.  Marland Kitchen Chronic kidney disease, stage III (moderate)     . Depressive disorder, not elsewhere classified   . Diverticulosis of colon (without mention of hemorrhage)   . Duodenal mass   . Duodenal ulcer   . Edema   . Fatty liver   . Grave's disease   . HOCM (hypertrophic obstructive cardiomyopathy) ([place])    a. 06/2012 Echo: EF 65-70%, sev LVH w/ dynamic obstruction, Gr1 DD, mild AI, mildly dil LA;  b. 05/2016 Echo: EF 60-65%, mild LVH, no rwma.  . Hypertension   . Hypertensive kidney disease, benign   . Hypothyroid   . Malignant neoplasm of breast (female), unspecified site 2001   right, s/p lumpectomy and XRT  . Malignant neoplasm of kidney, except pelvis 2008   s/p partial nephrectomy  . Mixed hyperlipidemia   . Obstructive sleep apnea (adult) (pediatric)   . Personal history of colonic polyps 10/22/2002   hyperplastic   . Radiation 2001   BREAST CA  . Sleep apnea    off cpap  . Unspecified hypothyroidism   . Vitamin D deficiency     Surgical History: Past Surgical History:  Procedure Laterality Date  . BREAST BIOPSY Left 03/14/2016   stereo  . BREAST LUMPECTOMY Right    right  . CATARACT EXTRACTION     bilateral  . COLONOSCOPY     last 2012.+ TA polyps  . CORONARY STENT PLACEMENT     brain  . CSF SHUNT  08/17/2008  . FEMORAL ARTERY REPAIR  2011  . HEMIARTHROPLASTY SHOULDER  FRACTURE     left  . PARTIAL NEPHRECTOMY     right  . RADIOACTIVE SEED GUIDED MASTECTOMY WITH AXILLARY SENTINEL LYMPH NODE BIOPSY Left 04/19/2016   Procedure: RADIOACTIVE SEED GUIDED PARTIAL MASTECTOMY WITH AXILLARY SENTINEL LYMPH NODE BIOPSY;  Surgeon: Fanny Skates, MD;  Location: [facility];  Service: General;  Laterality: Left;  . SHOULDER SURGERY     left  . UPPER GASTROINTESTINAL ENDOSCOPY  11-10-2014  . VAGINAL DELIVERY     3    Home Medications:  Allergies as of 05/26/2017      Reactions   Amlodipine    Leg swelling      Medication List       Accurate as of 05/26/17  9:58 AM. Always use your most recent med list.           aspirin 81 MG tablet Take 162 mg by mouth daily at 12 noon. Take at noon.   atorvastatin 40 MG tablet Commonly known as:  LIPITOR Take 1 tablet (40 mg total) by mouth daily.   buPROPion 100 MG 12 hr tablet Commonly known as:  WELLBUTRIN SR Take 100 mg by mouth 2 (two) times daily.   carvedilol 12.5 MG tablet Commonly known as:  COREG TAKE 1 TABLET TWICE A DAY  WITH MEALS.   cloNIDine 0.1 MG tablet Commonly known as:  CATAPRES Take 1 tablet (0.1 mg total) by mouth 3 (three) times daily.   doxazosin 4 MG tablet Commonly known as:  CARDURA Take 4 mg by mouth daily.   hydrALAZINE 100 MG tablet Commonly known as:  APRESOLINE Take 1 tablet (100 mg total) by mouth 3 (three) times daily.   letrozole 2.5 MG tablet Commonly known as:  FEMARA Take 1 tablet (2.5 mg total) by mouth daily.   lisinopril 10 MG tablet Commonly known as:  PRINIVIL,ZESTRIL Take 10 mg by mouth daily.   Melatonin 5 MG Tabs Take by mouth.   mirtazapine 45 MG tablet Commonly known as:  REMERON Take 45 mg by mouth at bedtime.   multivitamin tablet Take 1 tablet by mouth daily.   nitroGLYCERIN 0.4 MG SL tablet Commonly known as:  NITROSTAT Place 1 tablet (0.4 mg total) under the tongue every 5 (five) minutes as needed for chest pain.   ranitidine 150 MG capsule Commonly known as:  ZANTAC Take 1 capsule (150 mg total) by mouth 2 (two) times daily.   rOPINIRole 1 MG tablet Commonly known as:  REQUIP TAKE 1 TABLET (1 MG TOTAL) BY MOUTH DAILY AS NEEDED.   SYNTHROID 175 MCG tablet Generic drug:  levothyroxine TAKE 1 TABLET DAILY        (THYROID STIMULATING       HORMONE NEEDED PRIOR TO    ANY MORE REFILLS)   temazepam 15 MG capsule Commonly known as:  RESTORIL Take 15 mg by mouth at bedtime as needed.   TOVIAZ 4 MG Tb24 tablet Generic drug:  fesoterodine Take 4 mg by mouth daily.       Allergies:  Allergies  Allergen Reactions  . Amlodipine     Leg swelling    Family  History: Family History  Problem Relation Age of Onset  . Colon cancer Sister   . Cancer Sister        colon  . Heart disease Father   . Heart disease Mother   . Esophageal cancer Neg Hx   . Rectal cancer Neg Hx   . Stomach cancer Neg Hx  Social History:  reports that she quit smoking about 9 years ago. Her smoking use included Cigarettes. She started smoking about 53 years ago. She has a 43.00 pack-year smoking history. She has never used smokeless tobacco. She reports that she does not drink alcohol or use drugs.  ROS: UROLOGY Frequent Urination?: Yes Hard to postpone urination?: No Burning/pain with urination?: No Get up at night to urinate?: No Leakage of urine?: Yes Urine stream starts and stops?: No Trouble starting stream?: No Do you have to strain to urinate?: Yes Blood in urine?: No Urinary tract infection?: No Sexually transmitted disease?: No Injury to kidneys or bladder?: No Painful intercourse?: No Weak stream?: No Currently pregnant?: No Vaginal bleeding?: No Last menstrual period?: n  Gastrointestinal Nausea?: No Vomiting?: No Indigestion/heartburn?: No Diarrhea?: No Constipation?: No  Constitutional Fever: No Night sweats?: No Weight loss?: No Fatigue?: No  Skin Skin rash/lesions?: No Itching?: No  Eyes Blurred vision?: No Double vision?: No  Ears/Nose/Throat Sore throat?: No Sinus problems?: No  Hematologic/Lymphatic Swollen glands?: No Easy bruising?: No  Cardiovascular Leg swelling?: No Chest pain?: No  Respiratory Cough?: No Shortness of breath?: No  Endocrine Excessive thirst?: No  Musculoskeletal Back pain?: No Joint pain?: No  Neurological Headaches?: No Dizziness?: No  Psychologic Depression?: No Anxiety?: No  Physical Exam: BP (!) 148/80   Pulse 62   Ht 5\' 7"  (1.702 m)   Wt 89.8 kg (198 lb)   BMI 31.01 kg/m   Constitutional:  Alert and oriented, No acute distress.   Laboratory Data: Lab  Results  Component Value Date   WBC 4.4 03/21/2017   HGB 12.1 03/21/2017   HCT 36.8 03/21/2017   MCV 92 03/21/2017   PLT 140 (L) 03/21/2017    Lab Results  Component Value Date   CREATININE 1.6 (H) 03/21/2017    No results found for: PSA  No results found for: TESTOSTERONE  Lab Results  Component Value Date   HGBA1C 5.6 11/08/2016    Urinalysis    Component Value Date/Time   COLORURINE COLORLESS (A) 06/01/2016 0610   APPEARANCEUR CLEAR (A) 06/01/2016 0610   APPEARANCEUR Clear 10/23/2015 1406   LABSPEC 1.004 (L) 06/01/2016 0610   PHURINE 5.0 06/01/2016 0610   GLUCOSEU NEGATIVE 06/01/2016 0610   HGBUR NEGATIVE 06/01/2016 0610   BILIRUBINUR NEGATIVE 06/01/2016 0610   BILIRUBINUR neg 03/12/2016 1126   BILIRUBINUR Negative 10/23/2015 1406   KETONESUR NEGATIVE 06/01/2016 0610   PROTEINUR NEGATIVE 06/01/2016 0610   UROBILINOGEN 0.2 03/12/2016 1126   UROBILINOGEN 0.2 07/24/2011 1347   NITRITE NEGATIVE 06/01/2016 0610   LEUKOCYTESUR NEGATIVE 06/01/2016 0610   LEUKOCYTESUR Negative 10/23/2015 1406    Pertinent Imaging: none  Assessment & Plan:  I gave her a prescription of Detrol LA 4 mg. I will see her in 6 weeks. We can always go back to Toviaz if needed. I will get my notes from [place]. Again clinically her primary problem appears to be overactive bladder     1. Urinary incontinence, unspecified type 2. frequency   No Follow-up on file.  Reece Packer, MD  [facility] [address], [place], [place] [address] [phone]

## 2017-05-29 DIAGNOSIS — R801 Persistent proteinuria, unspecified: Secondary | ICD-10-CM | POA: Diagnosis not present

## 2017-05-29 DIAGNOSIS — I129 Hypertensive chronic kidney disease with stage 1 through stage 4 chronic kidney disease, or unspecified chronic kidney disease: Secondary | ICD-10-CM | POA: Diagnosis not present

## 2017-05-29 DIAGNOSIS — N2581 Secondary hyperparathyroidism of renal origin: Secondary | ICD-10-CM | POA: Diagnosis not present

## 2017-05-29 DIAGNOSIS — R809 Proteinuria, unspecified: Secondary | ICD-10-CM | POA: Diagnosis not present

## 2017-05-29 DIAGNOSIS — N183 Chronic kidney disease, stage 3 (moderate): Secondary | ICD-10-CM | POA: Diagnosis not present

## 2017-06-03 ENCOUNTER — Ambulatory Visit: Payer: Medicare Other | Admitting: Cardiovascular Disease

## 2017-06-15 NOTE — Progress Notes (Signed)
Cardiology Office Note  Date:  06/17/2017   ID:  Cassidy Ortiz, DOB 07-17-1941, MRN [id number]  PCP:  Crecencio Mc, MD   Chief Complaint  Patient presents with  . other    6 month f/u no complaints today. Meds reviewed verbally with pt.    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  hospital for severe hypertension, in hindsight felt secondary to severe pain after she received Neulasta shot.   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  history of right renal cell carcinoma.   Finished treatment for invasive ductal carcinoma of the left breast.  Completed 4 rounds of chemotherapy and radiation Who presents for follow up of her PAD, and HOCM  In follow-up today she reports that she is doing well Insomnia improving Active with no regular exercise No new complaints Denies any leg swelling, shortness of breath, chest pain She does not check her blood pressure at home Blood pressure mildly elevated on today's visit that she did not take her noon medications Reports she has completed all of her chemotherapy  Lab work reviewed with her in detail HBA1C 5.6 Total chol in 07/2014: 150,   EKG on today's visit shows normal sinus rhythm with rate 59 bpm, no significant ST or T-wave changes  Past medical history went to the emergency room 04/03/2015 for severe hypertension Systolic pressures were greater than 200. compliance with her medication She was not anxious, was not in pain, denied any stressors that could  have contributed to her high blood pressure they gave her hydralazine IV, gave her her regular afternoon medications  creatinine 1.49, BUN 31, hematocrit 40, EKG with normal sinus rhythm, rate 56 bpm, CT scan of the head with no acute findings  EGD on December 3 and had severe hypertension prior to the procedure. EGD showed small region of Barrett's esophagus After she got home, blood pressure seem to improve down to her baseline in the 130 range Went in for colonoscopy 11/18/2014 and again had severe hypertension Again after the procedure, went home and blood pressure significantly improved  Echocardiogram showed severe LVH, mild outflow tract gradient. No mention of elevated right ventricular systolic pressures. Previous Problems with anemia before requiring IM iron, now resolved Previous EGD showed ulcer, H. Pylori negative.  Earlier in 2013, we discontinued the diltiazem and Her edema resolved.  She reports that in followup today, she is depressed. Recently started on Remeron for sleep over the past several months. Uncertain if this is helping but sleep is okay. She's not doing any exercise. Very sedentary. Feels her blood pressure is doing well but is not checking any numbers. Weight continues to trend upwards  previous assessment/evaluation at [facility] for history of aneurysms and shunt (leak?) showed that shunt is not working but it is not a problem. No recent carotid ultrasound  PMH:   has a past medical history of Abnormal weight gain; Acquired cyst of kidney; Anxiety state, unspecified; Baker's cyst of knee; Breast cancer ([place]) (2001); Breast cancer of upper-inner quadrant of left  female breast ([place]) (04/19/2016); Cerebral aneurysm rupture ([place]); Cerebral aneurysm, nonruptured (2008); Chronic diastolic CHF (congestive heart failure) ([place]); Chronic kidney disease, stage III (moderate); Depressive disorder, not elsewhere classified; Diverticulosis of colon (without mention of  hemorrhage); Duodenal mass; Duodenal ulcer; Edema; Fatty liver; Grave's disease; HOCM (hypertrophic obstructive cardiomyopathy) ([place]); Hypertension; Hypertensive kidney disease, benign; Hypothyroid; Malignant neoplasm of breast (female), unspecified site (2001); Malignant neoplasm of kidney, except pelvis (2008); Mixed hyperlipidemia; Obstructive sleep apnea (adult) (pediatric); Personal history of colonic polyps (10/22/2002); Radiation (2001); Sleep apnea; Unspecified hypothyroidism; and Vitamin D deficiency.  PSH:    Past Surgical History:  Procedure Laterality Date  . BREAST BIOPSY Left 03/14/2016   stereo  . BREAST LUMPECTOMY Right    right  . CATARACT EXTRACTION     bilateral  . COLONOSCOPY     last 2012.+ TA polyps  . CORONARY STENT PLACEMENT     brain  . CSF SHUNT  08/17/2008  . FEMORAL ARTERY REPAIR  2011  . HEMIARTHROPLASTY SHOULDER FRACTURE     left  . PARTIAL NEPHRECTOMY     right  . RADIOACTIVE SEED GUIDED MASTECTOMY WITH AXILLARY SENTINEL LYMPH NODE BIOPSY Left 04/19/2016   Procedure: RADIOACTIVE SEED GUIDED PARTIAL MASTECTOMY WITH AXILLARY SENTINEL LYMPH NODE BIOPSY;  Surgeon: Fanny Skates, MD;  Location: [facility];  Service: General;  Laterality: Left;  . SHOULDER SURGERY     left  . UPPER GASTROINTESTINAL ENDOSCOPY  11-10-2014  . VAGINAL DELIVERY     3    Current Outpatient Prescriptions  Medication Sig Dispense Refill  . aspirin 81 MG tablet Take 162 mg by mouth daily at 12 noon. Take at noon.    Marland Kitchen atorvastatin (LIPITOR) 40 MG tablet Take 1 tablet (40 mg total) by mouth daily. 90 tablet 0  . buPROPion (WELLBUTRIN SR) 100 MG 12 hr tablet Take 100 mg by mouth 2 (two) times daily.    . carvedilol (COREG) 12.5 MG tablet TAKE 1 TABLET TWICE A DAY  WITH MEALS. 180 tablet 3  . cloNIDine (CATAPRES) 0.1 MG tablet Take 1 tablet (0.1 mg total) by mouth 3 (three) times daily. 270 tablet 3  . doxazosin (CARDURA) 4 MG tablet Take 4 mg by mouth daily.    .  fesoterodine (TOVIAZ) 4 MG TB24 tablet Take 4 mg by mouth daily.    . hydrALAZINE (APRESOLINE) 100 MG tablet Take 1 tablet (100 mg total) by mouth 3 (three) times daily. 270 tablet 3  . letrozole (FEMARA) 2.5 MG tablet Take 1 tablet (2.5 mg total) by mouth daily. 90 tablet 12  . lisinopril (PRINIVIL,ZESTRIL) 10 MG tablet Take 10 mg by mouth daily.    . Melatonin 5 MG TABS Take by mouth.    . mirtazapine (REMERON) 45 MG tablet Take 45 mg by mouth at bedtime.    . Multiple Vitamin (MULTIVITAMIN) tablet Take 1 tablet by mouth daily.      . nitroGLYCERIN (NITROSTAT) 0.4 MG SL tablet Place 1 tablet (0.4 mg total) under the tongue every 5 (five) minutes as needed for chest pain. 25 tablet 3  . ranitidine (ZANTAC) 150 MG capsule Take 1 capsule (150 mg total) by mouth 2 (two) times daily. 180 capsule 3  . rOPINIRole (REQUIP) 1 MG tablet TAKE 1 TABLET (1 MG TOTAL) BY MOUTH DAILY AS NEEDED. 90 tablet 0  . SYNTHROID 175 MCG tablet TAKE 1 TABLET DAILY        (THYROID STIMULATING       HORMONE NEEDED  PRIOR TO    ANY MORE REFILLS) 90 tablet 1  . temazepam (RESTORIL) 15 MG capsule Take 15 mg by mouth at bedtime as needed.  0  . tolterodine (DETROL LA) 4 MG 24 hr capsule Take 1 capsule (4 mg total) by mouth daily. 30 capsule 11   No current facility-administered medications for this visit.    Facility-Administered Medications Ordered in Other Visits  Medication Dose Route Frequency Provider Last Rate Last Dose  . 0.9 %  sodium chloride infusion   Intravenous Continuous Volanda Napoleon, MD   Stopped at 04/20/15 1651     Allergies:   Amlodipine   Social History:  The patient  reports that she quit smoking about 9 years ago. Her smoking use included Cigarettes. She started smoking about 53 years ago. She has a 43.00 pack-year smoking history. She has never used smokeless tobacco. She reports that she does not drink alcohol or use drugs.   Family History:   family history includes Cancer in her sister; Colon  cancer in her sister; Heart disease in her father and mother.    Review of Systems: Review of Systems  Constitutional: Negative.   Respiratory: Negative.   Cardiovascular: Negative.   Gastrointestinal: Negative.   Musculoskeletal: Negative.   Neurological: Negative.   Psychiatric/Behavioral: The patient has insomnia.   All other systems reviewed and are negative.    PHYSICAL EXAM: VS:  BP (!) 142/90 (BP Location: Right Arm, Patient Position: Sitting, Cuff Size: Large)   Pulse (!) 59   Ht 5\' 7"  (1.702 m)   Wt 203 lb (92.1 kg)   BMI 31.79 kg/m  , BMI Body mass index is 31.79 kg/m. GEN: Well nourished, well developed, in no acute distress , obese HEENT: normal  Neck: no JVD, carotid bruits, or masses Cardiac: RRR; no murmurs, rubs, or gallops,no edema  Respiratory:  clear to auscultation bilaterally, normal work of breathing GI: soft, nontender, nondistended, + BS MS: no deformity or atrophy  Skin: warm and dry, no rash Neuro:  Strength and sensation are intact Psych: euthymic mood, full affect    Recent Labs: 06/25/2016: Magnesium 2.1 12/20/2016: TSH 1.380 03/21/2017: ALT(SGPT) 20; BUN, Bld 32; Creat 1.6; HGB 12.1; Platelets 140; Potassium 3.9; Sodium 145    Lipid Panel Lab Results  Component Value Date   CHOL 150 07/13/2014   HDL 58.20 07/13/2014   LDLCALC 69 07/13/2014   TRIG 114.0 07/13/2014      Wt Readings from Last 3 Encounters:  06/17/17 203 lb (92.1 kg)  05/26/17 198 lb (89.8 kg)  05/15/17 199 lb 6.5 oz (90.5 kg)       ASSESSMENT AND PLAN:  Hypertensive heart disease with heart failure (HCC) -  Blood pressure is well controlled on today's visit. No changes made to the medications  Acute diastolic CHF (congestive heart failure) ([place]) euvolemic on today's visit.  No changes to her medications  Mixed hyperlipidemia Tolerating Lipitor Recommended she have repeat lipid panel when she sees primary care or oncology Previously well  controlled  Cerebral aneurysm, nonruptured Previously evaluated at [facility], felt not to be an issue associated with severe/labile hypertension  HOCM (hypertrophic obstructive cardiomyopathy) ([place]) Asymptomatic, continue current medications  Hypertensive urgency Previous episode felt secondary to side effects from pain associated with Neulasta shot No further episodes  S/P VP shunt Previously evaluated at [facility]  Insomnia due to medical condition Followed by psychiatry. 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 EKG 12-Lead     Signed, Esmond Plants, M.D., Ph.D. 06/17/2017  [place], [place]

## 2017-06-17 ENCOUNTER — Ambulatory Visit (INDEPENDENT_AMBULATORY_CARE_PROVIDER_SITE_OTHER): Payer: Medicare Other | Admitting: Cardiovascular Disease

## 2017-06-17 ENCOUNTER — Encounter: Payer: Self-pay | Admitting: Cardiovascular Disease

## 2017-06-17 VITALS — BP 142/90 | HR 59 | Ht 67.0 in | Wt 203.0 lb

## 2017-06-17 DIAGNOSIS — I10 Essential (primary) hypertension: Secondary | ICD-10-CM

## 2017-06-17 DIAGNOSIS — I421 Obstructive hypertrophic cardiomyopathy: Secondary | ICD-10-CM | POA: Diagnosis not present

## 2017-06-17 DIAGNOSIS — G4733 Obstructive sleep apnea (adult) (pediatric): Secondary | ICD-10-CM

## 2017-06-17 DIAGNOSIS — E782 Mixed hyperlipidemia: Secondary | ICD-10-CM

## 2017-06-17 NOTE — Patient Instructions (Addendum)

## 2017-06-20 ENCOUNTER — Other Ambulatory Visit (HOSPITAL_BASED_OUTPATIENT_CLINIC_OR_DEPARTMENT_OTHER): Payer: Medicare Other

## 2017-06-20 ENCOUNTER — Ambulatory Visit (HOSPITAL_BASED_OUTPATIENT_CLINIC_OR_DEPARTMENT_OTHER): Payer: Medicare Other | Admitting: Family

## 2017-06-20 ENCOUNTER — Ambulatory Visit (HOSPITAL_BASED_OUTPATIENT_CLINIC_OR_DEPARTMENT_OTHER): Payer: Medicare Other

## 2017-06-20 VITALS — BP 105/70 | HR 55 | Temp 98.1°F | Resp 18 | Wt 200.0 lb

## 2017-06-20 DIAGNOSIS — Z85528 Personal history of other malignant neoplasm of kidney: Secondary | ICD-10-CM

## 2017-06-20 DIAGNOSIS — C50912 Malignant neoplasm of unspecified site of left female breast: Secondary | ICD-10-CM | POA: Diagnosis not present

## 2017-06-20 DIAGNOSIS — Z170 Estrogen receptor positive status [ER+]: Principal | ICD-10-CM

## 2017-06-20 DIAGNOSIS — Z86000 Personal history of in-situ neoplasm of breast: Secondary | ICD-10-CM | POA: Diagnosis not present

## 2017-06-20 DIAGNOSIS — C50919 Malignant neoplasm of unspecified site of unspecified female breast: Secondary | ICD-10-CM

## 2017-06-20 DIAGNOSIS — Z79811 Long term (current) use of aromatase inhibitors: Secondary | ICD-10-CM | POA: Diagnosis not present

## 2017-06-20 LAB — CBC WITH DIFFERENTIAL (CANCER CENTER ONLY)
BASO#: 0 10*3/uL (ref 0.0–0.2)
BASO%: 0.4 % (ref 0.0–2.0)
EOS ABS: 0.2 10*3/uL (ref 0.0–0.5)
EOS%: 3.6 % (ref 0.0–7.0)
HCT: 36.2 % (ref 34.8–46.6)
HGB: 12 g/dL (ref 11.6–15.9)
LYMPH#: 0.9 10*3/uL (ref 0.9–3.3)
LYMPH%: 17.5 % (ref 14.0–48.0)
MCH: 30.9 pg (ref 26.0–34.0)
MCHC: 33.1 g/dL (ref 32.0–36.0)
MCV: 93 fL (ref 81–101)
MONO#: 0.4 10*3/uL (ref 0.1–0.9)
MONO%: 6.7 % (ref 0.0–13.0)
NEUT#: 3.8 10*3/uL (ref 1.5–6.5)
NEUT%: 71.8 % (ref 39.6–80.0)
Platelets: 132 10*3/uL — ABNORMAL LOW (ref 145–400)
RBC: 3.88 10*6/uL (ref 3.70–5.32)
RDW: 14.3 % (ref 11.1–15.7)
WBC: 5.3 10*3/uL (ref 3.9–10.0)

## 2017-06-20 LAB — CMP (CANCER CENTER ONLY)
ALT(SGPT): 16 U/L (ref 10–47)
AST: 21 U/L (ref 11–38)
Albumin: 3.2 g/dL — ABNORMAL LOW (ref 3.3–5.5)
Alkaline Phosphatase: 82 U/L (ref 26–84)
BUN: 33 mg/dL — AB (ref 7–22)
CHLORIDE: 109 meq/L — AB (ref 98–108)
CO2: 25 mEq/L (ref 18–33)
CREATININE: 1.6 mg/dL — AB (ref 0.6–1.2)
Calcium: 9.5 mg/dL (ref 8.0–10.3)
GLUCOSE: 136 mg/dL — AB (ref 73–118)
Potassium: 4.1 mEq/L (ref 3.3–4.7)
SODIUM: 143 meq/L (ref 128–145)
TOTAL PROTEIN: 6.2 g/dL — AB (ref 6.4–8.1)
Total Bilirubin: 0.6 mg/dl (ref 0.20–1.60)

## 2017-06-20 IMAGING — CT CT RENAL STONE PROTOCOL
3 of 4 series · 10 of 46 positions shown, 17 images · non-contrast
Comparison: 05/14/2011 CT abdomen/pelvis

CLINICAL DATA: Right flank pain. Breast cancer. Recent
chemotherapy.

EXAM:
CT ABDOMEN AND PELVIS WITHOUT CONTRAST
TECHNIQUE: Multidetector CT imaging of the abdomen and pelvis was performed
following the standard protocol without IV contrast.

[Series 4: lung · axial · 0.78mm/px · z∈[+82,+182]mm · 6 of 29 slices shown, 11 images]
[im 5/29  soft-tissue]
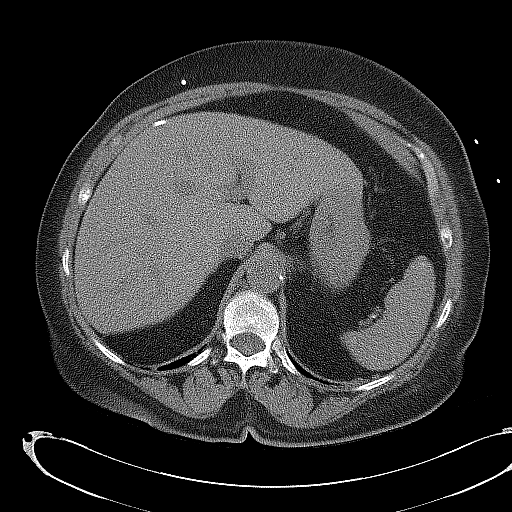
[im 5/29  bone]
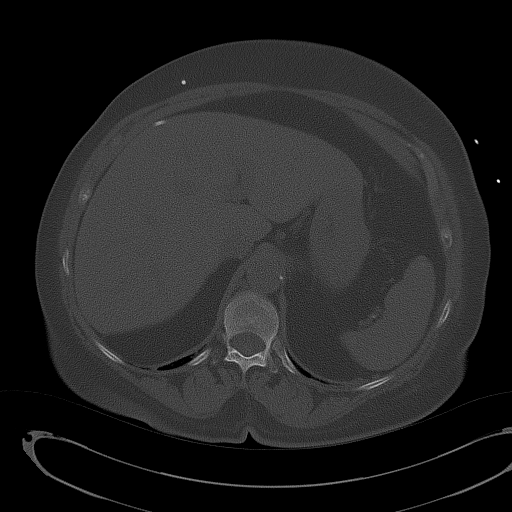
[im 9/29  soft-tissue]
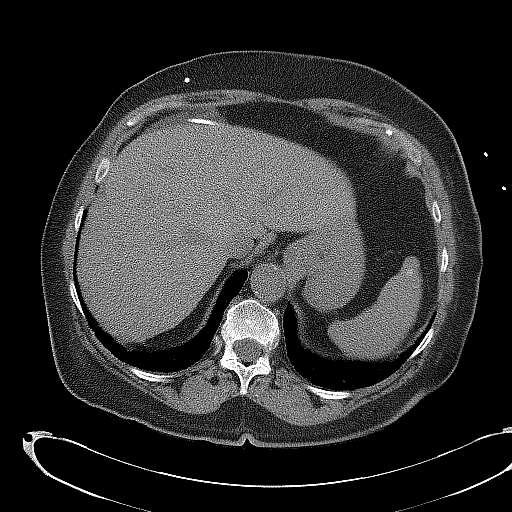
[im 13/29  soft-tissue]
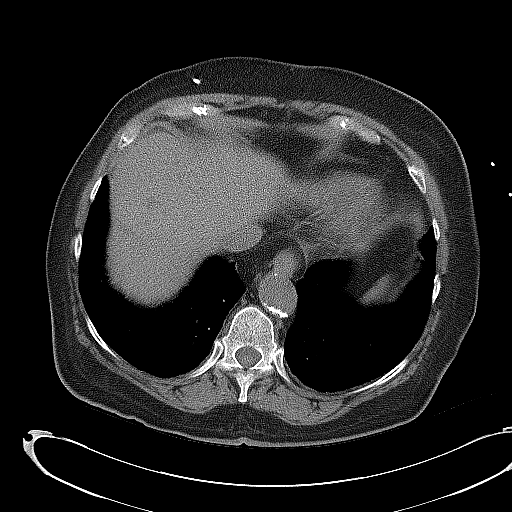
[im 13/29  lung]
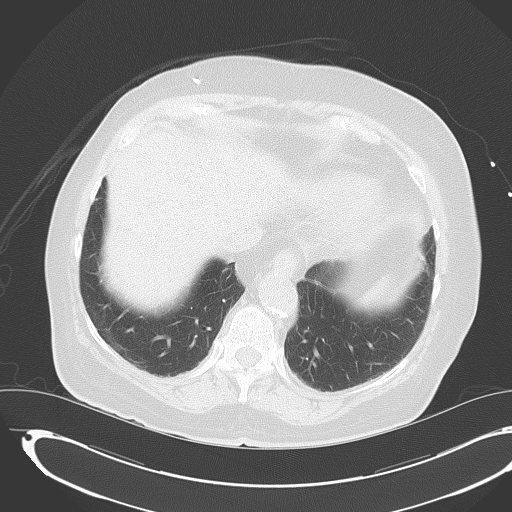
[im 17/29  soft-tissue]
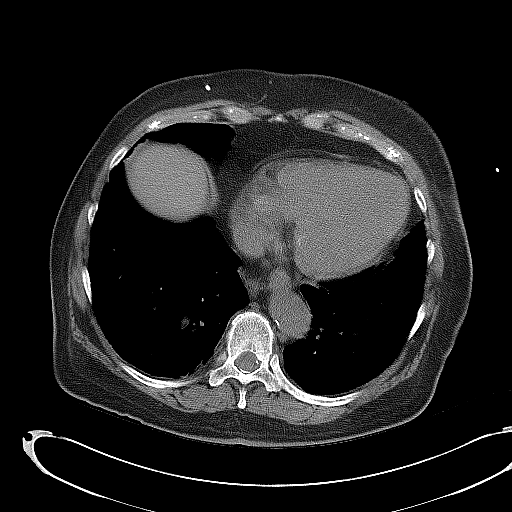
[im 17/29  lung]
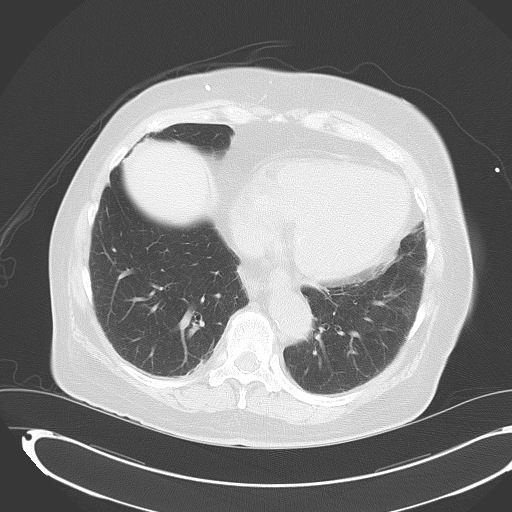
[im 21/29  soft-tissue]
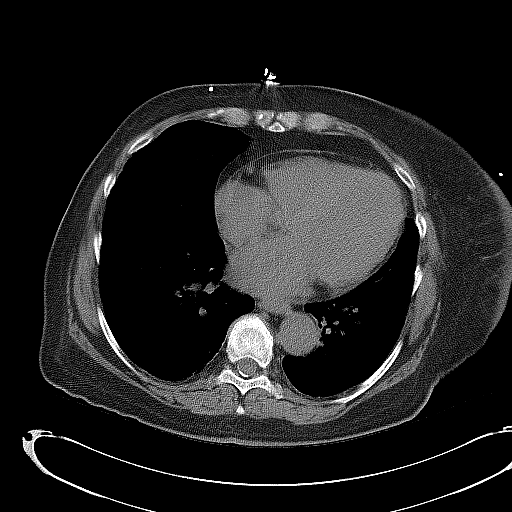
[im 21/29  lung]
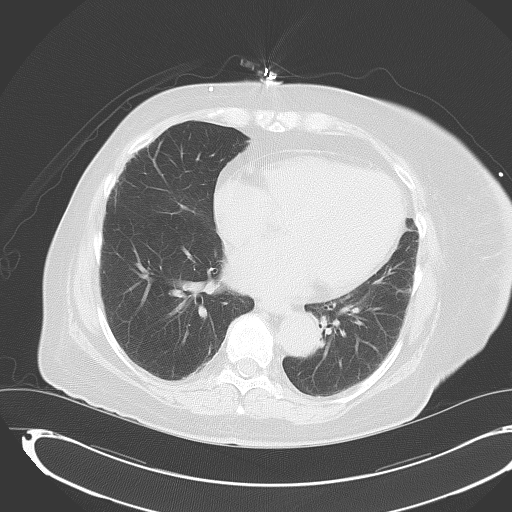
[im 25/29  soft-tissue]
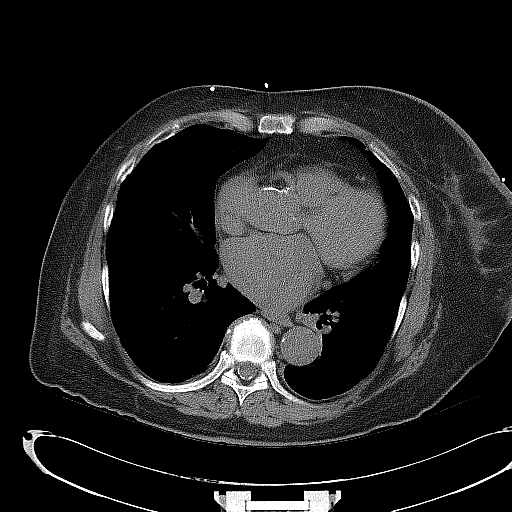
[im 25/29  lung]
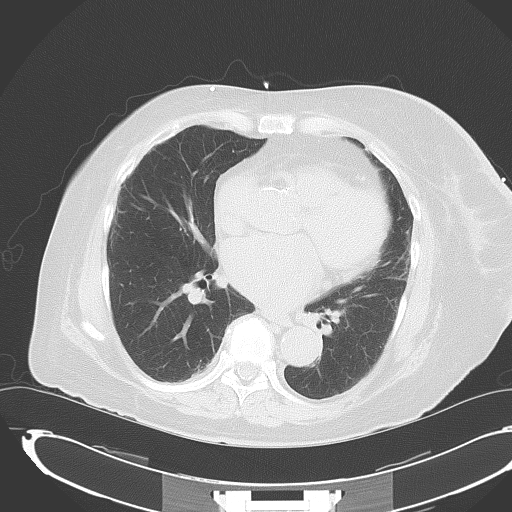

[Series 5: coronal · coronal · 0.94mm/px · 3 of 164 slices shown, 4 images]
[im 55/164  soft-tissue]
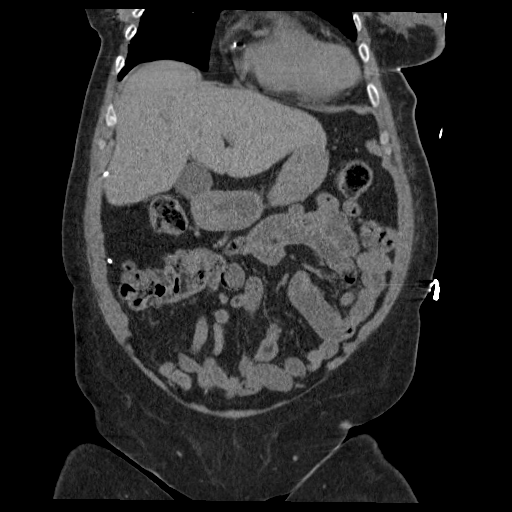
[im 73/164  soft-tissue]
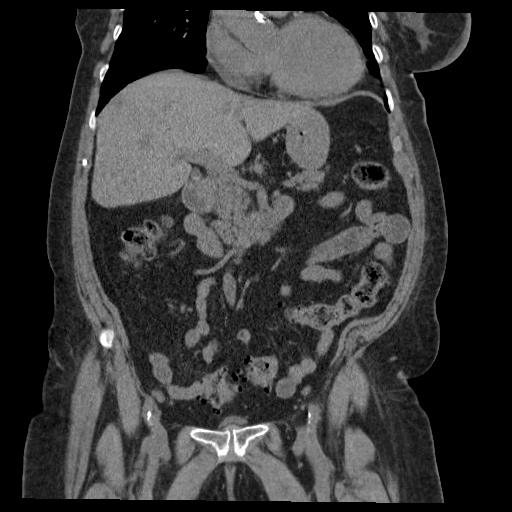
[im 73/164  bone]
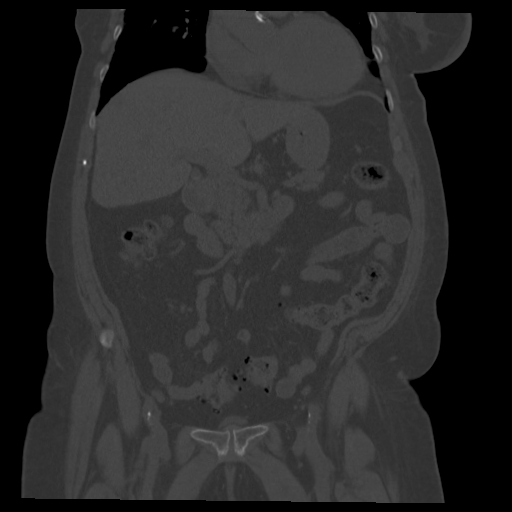
[im 91/164  soft-tissue]
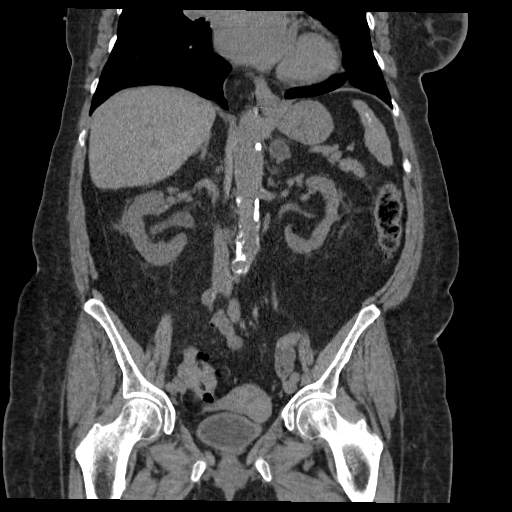

[Series 6: sagittal · sagittal · 0.93mm/px · 1 of 188 slices shown, 2 images]
[im 63/188  soft-tissue]
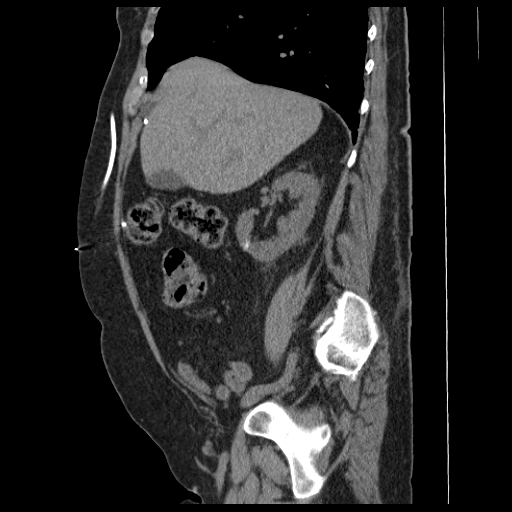
[im 63/188  bone]
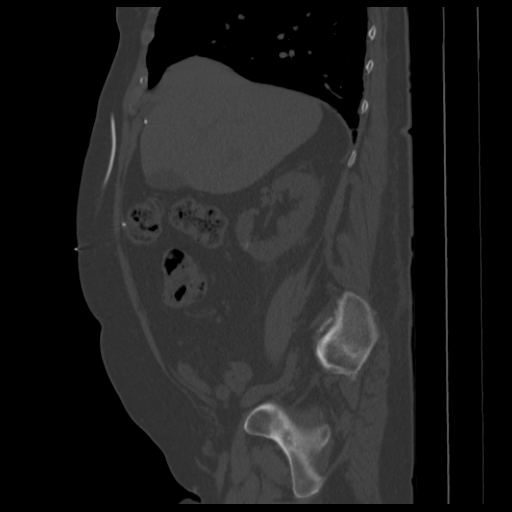

[10 of 46 positions shown; findings below may reference images not displayed]

FINDINGS: Lower chest: No significant pulmonary nodules or acute consolidative
airspace disease. Two-vessel coronary atherosclerosis. Surgical
clips in the visualized right breast. Superior approach right
subcutaneous chest wall catheter terminates in the anterior superior
right peritoneal cavity in the perihepatic space.

Hepatobiliary: Hypodense 1.0 cm right liver dome lesion is stable
since 05/14/2011, consistent with a benign lesion. No additional
liver lesions. Layering subcentimeter gallstone in the gallbladder,
with no gallbladder wall thickening or pericholecystic fluid. No
biliary ductal dilatation.

Pancreas: Normal, with no mass or duct dilation.

Spleen: Normal size spleen with stable scattered splenic
calcifications, possibly from prior granulomatous disease. No
splenic mass.

Adrenals/Urinary Tract: Stable 2.0 cm left adrenal adenoma. No right
adrenal mass . Exophytic simple 1.8 cm lateral lower left renal
cyst. No hydronephrosis. No renal stones. No contour deforming right
renal mass. Normal caliber ureters, with no ureteral stones.
Relatively collapsed and grossly normal bladder.

Stomach/Bowel: Grossly normal stomach. Normal caliber small bowel
with no small bowel wall thickening. Normal appendix. Marked sigmoid
diverticulosis. Diffuse sigmoid colon wall thickening without
pericolonic fat stranding, which appears chronic back to 5655, most
consistent with muscular hypertrophy from chronic diverticulosis.
Otherwise no colonic wall thickening or pericolonic fat stranding.

Vascular/Lymphatic: Atherosclerotic nonaneurysmal abdominal aorta.
No pathologically enlarged lymph nodes in the abdomen or pelvis.

Reproductive: Mildly enlarged myomatous uterus with 2.2 cm sub
serosal left uterine fibroid. No adnexal mass.

Other: No pneumoperitoneum. Trace anterior perihepatic fluid
surrounding the CSF shunt catheter.

Musculoskeletal: No aggressive appearing focal osseous lesions.
Moderate degenerative changes in the visualized thoracolumbar spine.
IMPRESSION: 1. No acute abnormality. No urolithiasis. No evidence of urinary
tract obstruction. No evidence of bowel obstruction or acute bowel
inflammation. Normal appendix.
2. Marked sigmoid diverticulosis with chronic sigmoid wall
thickening and no pericolonic fat stranding, most consistent with
muscular hypertrophy from chronic diverticulosis.
3. Additional findings include aortic atherosclerosis, coronary
atherosclerosis, cholelithiasis, left adrenal adenoma and uterine
fibroid.

## 2017-06-20 MED ORDER — SODIUM CHLORIDE 0.9% FLUSH
10.0000 mL | INTRAVENOUS | Status: DC | PRN
  Administered 2017-06-20: 10 mL via INTRAVENOUS
  Filled 2017-06-20: qty 10

## 2017-06-20 MED ORDER — HEPARIN SOD (PORK) LOCK FLUSH 100 UNIT/ML IV SOLN
500.0000 [IU] | Freq: Once | INTRAVENOUS | Status: AC
  Administered 2017-06-20: 500 [IU] via INTRAVENOUS
  Filled 2017-06-20: qty 5

## 2017-06-20 NOTE — Progress Notes (Signed)
Hematology and Oncology Follow Up Visit  Cassidy Ortiz [id number] [birth year] 76 y.o. 06/20/2017   Principle Diagnosis:  1. 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 2. 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 Intermittent iron-deficiency anemia 4.  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 She is doing quite well and has no complaints at this time. She verbalized that she is taking her Femara daily as prescribed.  Breast exam today was negative. No mass, lesion or rash noted. She is over due for her mammogram so we will get this scheduled at [facility] for her since that is much closer to her home.  No lymphadenopathy found on exam. No episodes of bleeding, bruising or petechiae.  No fever, chills, n/v, cough, rash, dizziness, SOB, chest pain, palpitations, abdominal pain or changes in bowel or bladder habits.  No swelling, tenderness, numbness or tingling in her extremities. No c/o pain.  She has maintained a good appetite and is staying well hydrated. Her weight is stable.   ECOG Performance Status: 1 - Symptomatic but completely ambulatory  Medications:  Allergies as of 06/20/2017      Reactions   Amlodipine    Leg swelling      Medication List       Accurate as of 06/20/17 11:53 AM. Always use your most recent med list.          aspirin 81 MG tablet Take 162 mg by mouth daily at 12 noon. Take at noon.   atorvastatin 40 MG tablet Commonly known as:  LIPITOR Take 1 tablet (40 mg total) by mouth daily.   buPROPion 100 MG 12 hr tablet Commonly known as:  WELLBUTRIN SR Take 100 mg by mouth 2 (two) times daily.   carvedilol 12.5 MG tablet Commonly known as:   COREG TAKE 1 TABLET TWICE A DAY  WITH MEALS.   cloNIDine 0.1 MG tablet Commonly known as:  CATAPRES Take 1 tablet (0.1 mg total) by mouth 3 (three) times daily.   doxazosin 4 MG tablet Commonly known as:  CARDURA Take 4 mg by mouth daily.   hydrALAZINE 100 MG tablet Commonly known as:  APRESOLINE Take 1 tablet (100 mg total) by mouth 3 (three) times daily.   letrozole 2.5 MG tablet Commonly known as:  FEMARA Take 1 tablet (2.5 mg total) by mouth daily.   lisinopril 10 MG tablet Commonly known as:  PRINIVIL,ZESTRIL Take 10 mg by mouth daily.   Melatonin 5 MG Tabs Take by mouth.   mirtazapine 45 MG tablet Commonly known as:  REMERON Take 45 mg by mouth at bedtime.   multivitamin tablet Take 1 tablet by mouth daily.   nitroGLYCERIN 0.4 MG SL tablet Commonly known as:  NITROSTAT Place 1 tablet (0.4 mg total) under the tongue every 5 (five) minutes as needed for chest pain.   ranitidine 150 MG capsule Commonly known as:  ZANTAC Take 1 capsule (150 mg total) by mouth 2 (two) times daily.   rOPINIRole 1 MG tablet Commonly known as:  REQUIP TAKE 1 TABLET (1 MG TOTAL) BY MOUTH DAILY AS NEEDED.   SYNTHROID 175 MCG tablet Generic  drug:  levothyroxine TAKE 1 TABLET DAILY        (THYROID STIMULATING       HORMONE NEEDED PRIOR TO    ANY MORE REFILLS)   temazepam 15 MG capsule Commonly known as:  RESTORIL Take 15 mg by mouth at bedtime as needed.   tolterodine 4 MG 24 hr capsule Commonly known as:  DETROL LA Take 1 capsule (4 mg total) by mouth daily.   TOVIAZ 4 MG Tb24 tablet Generic drug:  fesoterodine Take 4 mg by mouth daily.       Allergies:  Allergies  Allergen Reactions  . Amlodipine     Leg swelling    Past Medical History, Surgical history, Social history, and Family History were reviewed and updated.  Review of Systems: All other 10 point review of systems is negative.   Physical Exam:  vitals were not taken for this visit.  Wt Readings from  Last 3 Encounters:  06/20/17 200 lb (90.7 kg)  06/17/17 203 lb (92.1 kg)  05/26/17 198 lb (89.8 kg)    Ocular: Sclerae unicteric, pupils equal, round and reactive to light Ear-nose-throat: Oropharynx clear, dentition fair Lymphatic: No cervical, supraclavicular or axillary adenopathy Lungs no rales or rhonchi, good excursion bilaterally Heart regular rate and rhythm, no murmur appreciated Abd soft, nontender, positive bowel sounds, no liver or spleen tip palpated on exam, no fluid wave MSK no focal spinal tenderness, no joint edema Neuro: non-focal, well-oriented, appropriate affect Breasts: Left breast lumpectomy scar intact. No changes to the right breast. No mass, lesion or rash present.   Lab Results  Component Value Date   WBC 5.3 06/20/2017   HGB 12.0 06/20/2017   HCT 36.2 06/20/2017   MCV 93 06/20/2017   PLT 132 (L) 06/20/2017   Lab Results  Component Value Date   FERRITIN 53 04/08/2016   IRON 57 04/08/2016   TIBC 219 (L) 04/08/2016   UIBC 162 04/08/2016   IRONPCTSAT 26 04/08/2016   Lab Results  Component Value Date   RBC 3.88 06/20/2017   No results found for: KPAFRELGTCHN, LAMBDASER, KAPLAMBRATIO No results found for: IGGSERUM, IGA, IGMSERUM No results found for: Odetta Pink, SPEI   Chemistry      Component Value Date/Time   NA 145 03/21/2017 1148   NA 144 11/08/2016 1059   K 3.9 03/21/2017 1148   K 3.9 11/08/2016 1059   CL 107 03/21/2017 1148   CO2 28 03/21/2017 1148   CO2 24 11/08/2016 1059   BUN 32 (H) 03/21/2017 1148   BUN 35.7 (H) 11/08/2016 1059   CREATININE 1.6 (H) 03/21/2017 1148   CREATININE 1.4 (H) 11/08/2016 1059      Component Value Date/Time   CALCIUM 9.6 03/21/2017 1148   CALCIUM 9.4 11/08/2016 1059   ALKPHOS 92 (H) 03/21/2017 1148   ALKPHOS 101 11/08/2016 1059   AST 20 03/21/2017 1148   AST 13 11/08/2016 1059   ALT 20 03/21/2017 1148   ALT 11 11/08/2016 1059   BILITOT 0.50  03/21/2017 1148   BILITOT 0.33 11/08/2016 1059      Impression and Plan: Cassidy Ortiz is a very pleasant 76 yo caucasian female with history of stage I ductal carcinoma of the right breast. She also has history of renal cell carcinoma. She has since developed a new invasive ductal carcinoma of the left breast with a high Oncotype score. She had a lumpectomy followed by 4 cycles of chemotherapy. This was followed by  radiation.  She is doing well now on Femara and has no complaints at this time. She will continue on her same regimen.  She is due for her mammogram and will scheduled this at [facility] since that is closer to her home.  We will plan to see her back again in another 3 months for repeat lab work and follow-up.  Both she and her husband know to contact our office with any questions or concerns. We can certainly see her sooner if need be.   Eliezer Bottom, NP 7/13/201811:53 AM

## 2017-06-20 NOTE — Patient Instructions (Signed)
Implanted Port Insertion, Care After °This sheet gives you information about how to care for yourself after your procedure. Your health care provider may also give you more specific instructions. If you have problems or questions, contact your health care provider. °What can I expect after the procedure? °After your procedure, it is common to have: °· Discomfort at the port insertion site. °· Bruising on the skin over the port. This should improve over 3-4 days. ° °Follow these instructions at home: °Port care °· After your port is placed, you will get a manufacturer's information card. The card has information about your port. Keep this card with you at all times. °· Take care of the port as told by your health care provider. Ask your health care provider if you or a family member can get training for taking care of the port at home. A home health care nurse may also take care of the port. °· Make sure to remember what type of port you have. °Incision care °· Follow instructions from your health care provider about how to take care of your port insertion site. Make sure you: °? Wash your hands with soap and water before you change your bandage (dressing). If soap and water are not available, use hand sanitizer. °? Change your dressing as told by your health care provider. °? Leave stitches (sutures), skin glue, or adhesive strips in place. These skin closures may need to stay in place for 2 weeks or longer. If adhesive strip edges start to loosen and curl up, you may trim the loose edges. Do not remove adhesive strips completely unless your health care provider tells you to do that. °· Check your port insertion site every day for signs of infection. Check for: °? More redness, swelling, or pain. °? More fluid or blood. °? Warmth. °? Pus or a bad smell. °General instructions °· Do not take baths, swim, or use a hot tub until your health care provider approves. °· Do not lift anything that is heavier than 10 lb (4.5  kg) for a week, or as told by your health care provider. °· Ask your health care provider when it is okay to: °? Return to work or school. °? Resume usual physical activities or sports. °· Do not drive for 24 hours if you were given a medicine to help you relax (sedative). °· Take over-the-counter and prescription medicines only as told by your health care provider. °· Wear a medical alert bracelet in case of an emergency. This will tell any health care providers that you have a port. °· Keep all follow-up visits as told by your health care provider. This is important. °Contact a health care provider if: °· You cannot flush your port with saline as directed, or you cannot draw blood from the port. °· You have a fever or chills. °· You have more redness, swelling, or pain around your port insertion site. °· You have more fluid or blood coming from your port insertion site. °· Your port insertion site feels warm to the touch. °· You have pus or a bad smell coming from the port insertion site. °Get help right away if: °· You have chest pain or shortness of breath. °· You have bleeding from your port that you cannot control. °Summary °· Take care of the port as told by your health care provider. °· Change your dressing as told by your health care provider. °· Keep all follow-up visits as told by your health care provider. °  This information is not intended to replace advice given to you by your health care provider. Make sure you discuss any questions you have with your health care provider. °Document Released: 09/15/2013 Document Revised: 10/16/2016 Document Reviewed: 10/16/2016 °Elsevier Interactive Patient Education © 2017 [employer] ° °

## 2017-06-30 ENCOUNTER — Ambulatory Visit (INDEPENDENT_AMBULATORY_CARE_PROVIDER_SITE_OTHER): Payer: Medicare Other | Admitting: Urology

## 2017-06-30 ENCOUNTER — Encounter: Payer: Self-pay | Admitting: Urology

## 2017-06-30 VITALS — BP 126/76 | HR 67 | Ht 67.0 in | Wt 202.4 lb

## 2017-06-30 DIAGNOSIS — N3946 Mixed incontinence: Secondary | ICD-10-CM | POA: Diagnosis not present

## 2017-06-30 NOTE — Progress Notes (Signed)
06/30/2017 3:59 PM   Cassidy Ortiz 1941-02-27 [id number]  Referring provider: Crecencio Mc, MD [address], [place] [address]  Chief Complaint  Patient presents with  . Urinary Incontinence    HPI: Dr Tresa Moore (2016): The patient had mixed incontinence and had had urodynamics in 2016. The patient had a partial right nephrectomy for renal cell carcinoma.    Today Patient is continent during the day with reduced frequency. She does not have bedwetting. 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 The Lisbeth Ply works great. It is $90 per month..   I reviewed my [place] note. She had low pressure bladder overactivity on urodynamics and wished triggering. She had mild to moderate stress incontinence. Her primary problem was urgency incontinence with mild stress incontinence. I was going to consider neuromodulation therapy versus sling and I would require to take the stress component. She is having memory issues. I was concerned that her overactive bladder could persist after a sling due to her CNS issues. She had minimal response to trospium. The higher dose Toviaz cause dry mouth. She just went through breast cancer and chemotherapy. I believe she had side effects from Vesicare  The patient was given Detrol LA and I felt if needed we will go back to the [place]. I filled her primary problem was an overactive bladder  Today Frequency and urge incontinence much better. Clinically not infected. It works humps as well as the [employer]   PMH: Past Medical History:  Diagnosis Date  . Abnormal weight gain   . Acquired cyst of kidney   . Anxiety state, unspecified   . Baker's cyst of knee    Left, pt does not remember  . Breast cancer ([facility]) 2001   RT LUMPECTOMY  . Breast cancer of upper-inner quadrant of left female breast ([facility]) 04/19/2016  . Cerebral aneurysm rupture ([facility])    stent  . Cerebral aneurysm, nonruptured 2008   s/p coil and shunt, performed in [place]  . Chronic  diastolic CHF (congestive heart failure) ([facility])    a. 05/2016 Echo: EF 60-65%, no rwma, mild AI, nl RV fxn, mildly dil LA.  Marland Kitchen Chronic kidney disease, stage III (moderate)   . Depressive disorder, not elsewhere classified   . Diverticulosis of colon (without mention of hemorrhage)   . Duodenal mass   . Duodenal ulcer   . Edema   . Fatty liver   . Grave's disease   . HOCM (hypertrophic obstructive cardiomyopathy) ([facility])    a. 06/2012 Echo: EF 65-70%, sev LVH w/ dynamic obstruction, Gr1 DD, mild AI, mildly dil LA;  b. 05/2016 Echo: EF 60-65%, mild LVH, no rwma.  . Hypertension   . Hypertensive kidney disease, benign   . Hypothyroid   . Malignant neoplasm of breast (female), unspecified site 2001   right, s/p lumpectomy and XRT  . Malignant neoplasm of kidney, except pelvis 2008   s/p partial nephrectomy  . Mixed hyperlipidemia   . Obstructive sleep apnea (adult) (pediatric)   . Personal history of colonic polyps 10/22/2002   hyperplastic   . Radiation 2001   BREAST CA  . Sleep apnea    off cpap  . Unspecified hypothyroidism   . Vitamin D deficiency     Surgical History: Past Surgical History:  Procedure Laterality Date  . BREAST BIOPSY Left 03/14/2016   stereo  . BREAST LUMPECTOMY Right    right  . CATARACT EXTRACTION     bilateral  . COLONOSCOPY  last 2012.+ TA polyps  . CORONARY STENT PLACEMENT     brain  . CSF SHUNT  08/17/2008  . FEMORAL ARTERY REPAIR  2011  . HEMIARTHROPLASTY SHOULDER FRACTURE     left  . PARTIAL NEPHRECTOMY     right  . RADIOACTIVE SEED GUIDED MASTECTOMY WITH AXILLARY SENTINEL LYMPH NODE BIOPSY Left 04/19/2016   Procedure: RADIOACTIVE SEED GUIDED PARTIAL MASTECTOMY WITH AXILLARY SENTINEL LYMPH NODE BIOPSY;  Surgeon: Fanny Skates, MD;  Location: [facility];  Service: General;  Laterality: Left;  . SHOULDER SURGERY     left  . UPPER GASTROINTESTINAL ENDOSCOPY  11-10-2014  . VAGINAL DELIVERY     3    Home Medications:  Allergies  as of 06/30/2017      Reactions   Amlodipine    Leg swelling      Medication List       Accurate as of 06/30/17  3:59 PM. Always use your most recent med list.          aspirin 81 MG tablet Take 162 mg by mouth daily at 12 noon. Take at noon.   atorvastatin 40 MG tablet Commonly known as:  LIPITOR Take 1 tablet (40 mg total) by mouth daily.   buPROPion 100 MG 12 hr tablet Commonly known as:  WELLBUTRIN SR Take 100 mg by mouth 2 (two) times daily.   carvedilol 12.5 MG tablet Commonly known as:  COREG TAKE 1 TABLET TWICE A DAY  WITH MEALS.   cloNIDine 0.1 MG tablet Commonly known as:  CATAPRES Take 1 tablet (0.1 mg total) by mouth 3 (three) times daily.   doxazosin 4 MG tablet Commonly known as:  CARDURA Take 4 mg by mouth daily.   hydrALAZINE 100 MG tablet Commonly known as:  APRESOLINE Take 1 tablet (100 mg total) by mouth 3 (three) times daily.   letrozole 2.5 MG tablet Commonly known as:  FEMARA Take 1 tablet (2.5 mg total) by mouth daily.   lisinopril 10 MG tablet Commonly known as:  PRINIVIL,ZESTRIL Take 10 mg by mouth daily.   Melatonin 5 MG Tabs Take by mouth.   mirtazapine 45 MG tablet Commonly known as:  REMERON Take 45 mg by mouth at bedtime.   multivitamin tablet Take 1 tablet by mouth daily.   nitroGLYCERIN 0.4 MG SL tablet Commonly known as:  NITROSTAT Place 1 tablet (0.4 mg total) under the tongue every 5 (five) minutes as needed for chest pain.   ranitidine 150 MG capsule Commonly known as:  ZANTAC Take 1 capsule (150 mg total) by mouth 2 (two) times daily.   rOPINIRole 1 MG tablet Commonly known as:  REQUIP TAKE 1 TABLET (1 MG TOTAL) BY MOUTH DAILY AS NEEDED.   SYNTHROID 175 MCG tablet Generic drug:  levothyroxine TAKE 1 TABLET DAILY        (THYROID STIMULATING       HORMONE NEEDED PRIOR TO    ANY MORE REFILLS)   temazepam 15 MG capsule Commonly known as:  RESTORIL Take 15 mg by mouth at bedtime as needed.   tolterodine 4 MG  24 hr capsule Commonly known as:  DETROL LA Take 1 capsule (4 mg total) by mouth daily.   TOVIAZ 4 MG Tb24 tablet Generic drug:  fesoterodine Take 4 mg by mouth daily.       Allergies:  Allergies  Allergen Reactions  . Amlodipine     Leg swelling    Family History: Family History  Problem Relation Age  of Onset  . Colon cancer Sister   . Cancer Sister        colon  . Heart disease Father   . Heart disease Mother   . Esophageal cancer Neg Hx   . Rectal cancer Neg Hx   . Stomach cancer Neg Hx     Social History:  reports that she quit smoking about 9 years ago. Her smoking use included Cigarettes. She started smoking about 53 years ago. She has a 43.00 pack-year smoking history. She has never used smokeless tobacco. She reports that she does not drink alcohol or use drugs.  ROS: UROLOGY Frequent Urination?: Yes Hard to postpone urination?: Yes Burning/pain with urination?: No Get up at night to urinate?: Yes Leakage of urine?: No Urine stream starts and stops?: No Trouble starting stream?: No Do you have to strain to urinate?: No Blood in urine?: No Urinary tract infection?: No Sexually transmitted disease?: No Injury to kidneys or bladder?: No Painful intercourse?: No Weak stream?: No Currently pregnant?: No Vaginal bleeding?: No Last menstrual period?: n  Gastrointestinal Nausea?: No Vomiting?: No Indigestion/heartburn?: No Diarrhea?: No Constipation?: Yes  Constitutional Fever: No Night sweats?: No Weight loss?: No Fatigue?: No  Skin Skin rash/lesions?: No Itching?: No  Eyes Blurred vision?: No Double vision?: No  Ears/Nose/Throat Sore throat?: No Sinus problems?: No  Hematologic/Lymphatic Swollen glands?: No Easy bruising?: No  Cardiovascular Leg swelling?: No Chest pain?: No  Respiratory Cough?: No Shortness of breath?: No  Endocrine Excessive thirst?: No  Musculoskeletal Back pain?: No Joint pain?:  No  Neurological Headaches?: No Dizziness?: No  Psychologic Depression?: Yes Anxiety?: No  Physical Exam: BP 126/76 (BP Location: Left Arm, Patient Position: Sitting, Cuff Size: Normal)   Pulse 67   Ht 5\' 7"  (1.702 m)   Wt 202 lb 6.4 oz (91.8 kg)   BMI 31.70 kg/m   Constitutional:  Alert and oriented, No acute distress.  Laboratory Data: Lab Results  Component Value Date   WBC 5.3 06/20/2017   HGB 12.0 06/20/2017   HCT 36.2 06/20/2017   MCV 93 06/20/2017   PLT 132 (L) 06/20/2017    Lab Results  Component Value Date   CREATININE 1.6 (H) 06/20/2017    No results found for: PSA  No results found for: TESTOSTERONE  Lab Results  Component Value Date   HGBA1C 5.6 11/08/2016    Urinalysis    Component Value Date/Time   COLORURINE COLORLESS (A) 06/01/2016 0610   APPEARANCEUR CLEAR (A) 06/01/2016 0610   APPEARANCEUR Clear 10/23/2015 1406   LABSPEC 1.004 (L) 06/01/2016 0610   PHURINE 5.0 06/01/2016 0610   GLUCOSEU NEGATIVE 06/01/2016 0610   HGBUR NEGATIVE 06/01/2016 0610   BILIRUBINUR NEGATIVE 06/01/2016 0610   BILIRUBINUR neg 03/12/2016 1126   BILIRUBINUR Negative 10/23/2015 1406   KETONESUR NEGATIVE 06/01/2016 0610   PROTEINUR NEGATIVE 06/01/2016 0610   UROBILINOGEN 0.2 03/12/2016 1126   UROBILINOGEN 0.2 07/24/2011 1347   NITRITE NEGATIVE 06/01/2016 0610   LEUKOCYTESUR NEGATIVE 06/01/2016 0610   LEUKOCYTESUR Negative 10/23/2015 1406    Pertinent Imaging: none  Assessment & Plan:  Reassess in 9 months on Detrol  There are no diagnoses linked to this encounter.  No Follow-up on file.  Reece Packer, MD  [facility] [address], [place], [place] [address] [phone]

## 2017-07-10 DIAGNOSIS — F332 Major depressive disorder, recurrent severe without psychotic features: Secondary | ICD-10-CM | POA: Diagnosis not present

## 2017-07-10 DIAGNOSIS — G4700 Insomnia, unspecified: Secondary | ICD-10-CM | POA: Diagnosis not present

## 2017-07-11 ENCOUNTER — Encounter: Payer: Self-pay | Admitting: Internal Medicine

## 2017-07-11 ENCOUNTER — Ambulatory Visit (INDEPENDENT_AMBULATORY_CARE_PROVIDER_SITE_OTHER): Payer: Medicare Other | Admitting: Internal Medicine

## 2017-07-11 VITALS — BP 128/88 | HR 61 | Temp 99.1°F | Resp 16 | Ht 67.0 in | Wt 204.6 lb

## 2017-07-11 DIAGNOSIS — E782 Mixed hyperlipidemia: Secondary | ICD-10-CM | POA: Diagnosis not present

## 2017-07-11 DIAGNOSIS — K5904 Chronic idiopathic constipation: Secondary | ICD-10-CM | POA: Diagnosis not present

## 2017-07-11 DIAGNOSIS — G2581 Restless legs syndrome: Secondary | ICD-10-CM | POA: Diagnosis not present

## 2017-07-11 DIAGNOSIS — E039 Hypothyroidism, unspecified: Secondary | ICD-10-CM | POA: Diagnosis not present

## 2017-07-11 DIAGNOSIS — G911 Obstructive hydrocephalus: Secondary | ICD-10-CM

## 2017-07-11 DIAGNOSIS — Z8719 Personal history of other diseases of the digestive system: Secondary | ICD-10-CM

## 2017-07-11 DIAGNOSIS — I671 Cerebral aneurysm, nonruptured: Secondary | ICD-10-CM

## 2017-07-11 DIAGNOSIS — I10 Essential (primary) hypertension: Secondary | ICD-10-CM

## 2017-07-11 MED ORDER — ROPINIROLE HCL 2 MG PO TABS: 2.0000 mg | ORAL_TABLET | Freq: Every day | ORAL | 1 refills | Status: DC

## 2017-07-11 NOTE — Progress Notes (Signed)
Subjective:  Patient ID: Cassidy Ortiz, female    DOB: 14-Dec-1940  Age: 76 y.o. MRN: [id number]  CC: The primary encounter diagnosis was Hypothyroidism, unspecified type. Diagnoses of Mixed hyperlipidemia, Chronic idiopathic constipation, Cerebral aneurysm, Essential hypertension, Obstructive hydrocephalus, Restless legs syndrome, and History of Barrett's esophagus were also pertinent to this visit.  HPI Cassidy Ortiz presents for follow  Up on insomnia, GERd with Barrett's esophagus by 2015 EGD.   And malignant hypertension with history of severe LVH with HOCM and PAD with  history of SAH due to ruptured R ICA aneurysm/ s/pcoiling in 2008     she feels well in general, and states that she is sleeping better ,  But notes that her  restless legs is worse . She is taking Requip 1 mg daily in the evening.     She completed 4 cycles of adjuvant [facility] August 2017,  is s/p lumpectomy and XRT to left breast for new invasive ductal CA Stage 1C.   More recently treated for incontinence. Bladder issues improving with tolterodine  . Having "terrible constipation"      Outpatient Medications Prior to Visit  Medication Sig Dispense Refill  . aspirin 81 MG tablet Take 162 mg by mouth daily at 12 noon. Take at noon.    Marland Kitchen atorvastatin (LIPITOR) 40 MG tablet Take 1 tablet (40 mg total) by mouth daily. 90 tablet 0  . buPROPion (WELLBUTRIN SR) 100 MG 12 hr tablet Take 100 mg by mouth 2 (two) times daily.    . carvedilol (COREG) 12.5 MG tablet TAKE 1 TABLET TWICE A DAY  WITH MEALS. 180 tablet 3  . cloNIDine (CATAPRES) 0.1 MG tablet Take 1 tablet (0.1 mg total) by mouth 3 (three) times daily. 270 tablet 3  . doxazosin (CARDURA) 4 MG tablet Take 4 mg by mouth daily.    . hydrALAZINE (APRESOLINE) 100 MG tablet Take 1 tablet (100 mg total) by mouth 3 (three) times daily. 270 tablet 3  . letrozole (FEMARA) 2.5 MG tablet Take 1 tablet (2.5 mg total) by mouth daily. 90 tablet 12  . lisinopril (PRINIVIL,ZESTRIL) 10 MG  tablet Take 10 mg by mouth daily.    . Melatonin 5 MG TABS Take by mouth.    . mirtazapine (REMERON) 45 MG tablet Take 45 mg by mouth at bedtime.    . Multiple Vitamin (MULTIVITAMIN) tablet Take 1 tablet by mouth daily.      . nitroGLYCERIN (NITROSTAT) 0.4 MG SL tablet Place 1 tablet (0.4 mg total) under the tongue every 5 (five) minutes as needed for chest pain. 25 tablet 3  . ranitidine (ZANTAC) 150 MG capsule Take 1 capsule (150 mg total) by mouth 2 (two) times daily. 180 capsule 3  . SYNTHROID 175 MCG tablet TAKE 1 TABLET DAILY        (THYROID STIMULATING       HORMONE NEEDED PRIOR TO    ANY MORE REFILLS) 90 tablet 1  . temazepam (RESTORIL) 15 MG capsule Take 15 mg by mouth at bedtime as needed.  0  . tolterodine (DETROL LA) 4 MG 24 hr capsule Take 1 capsule (4 mg total) by mouth daily. 30 capsule 11  . rOPINIRole (REQUIP) 1 MG tablet TAKE 1 TABLET (1 MG TOTAL) BY MOUTH DAILY AS NEEDED. 90 tablet 0  . fesoterodine (TOVIAZ) 4 MG TB24 tablet Take 4 mg by mouth daily.     Facility-Administered Medications Prior to Visit  Medication Dose Route Frequency Provider Last Rate Last Dose  .  0.9 %  sodium chloride infusion   Intravenous Continuous Volanda Napoleon, MD   Stopped at 04/20/15 1651    Review of Systems;  Patient denies headache, fevers, malaise, unintentional weight loss, skin rash, eye pain, sinus congestion and sinus pain, sore throat, dysphagia,  hemoptysis , cough, dyspnea, wheezing, chest pain, palpitations, orthopnea, edema, abdominal pain, nausea, melena, diarrhea, constipation, flank pain, dysuria, hematuria, urinary  Frequency, nocturia, numbness, tingling, seizures,  Focal weakness, Loss of consciousness,  Tremor, insomnia, depression, anxiety, and suicidal ideation.      Objective:  BP 128/88 (BP Location: Right Arm, Patient Position: Sitting, Cuff Size: Large)   Pulse 61   Temp 99.1 F (37.3 C) (Oral)   Resp 16   Ht 5\' 7"  (1.702 m)   Wt 204 lb 9.6 oz (92.8 kg)   SpO2  95%   BMI 32.04 kg/m   BP Readings from Last 3 Encounters:  07/11/17 128/88  06/30/17 126/76  06/20/17 105/70    Wt Readings from Last 3 Encounters:  07/11/17 204 lb 9.6 oz (92.8 kg)  06/30/17 202 lb 6.4 oz (91.8 kg)  06/20/17 200 lb (90.7 kg)    General appearance: alert, cooperative and appears stated age Ears: normal TM's and external ear canals both ears Throat: lips, mucosa, and tongue normal; teeth and gums normal Neck: no adenopathy, no carotid bruit, supple, symmetrical, trachea midline and thyroid not enlarged, symmetric, no tenderness/mass/nodules Back: symmetric, no curvature. ROM normal. No CVA tenderness. Lungs: clear to auscultation bilaterally Heart: regular rate and rhythm, S1, S2 normal, no murmur, click, rub or gallop Abdomen: soft, non-tender; bowel sounds normal; no masses,  no organomegaly Pulses: 2+ and symmetric Skin: Skin color, texture, turgor normal. No rashes or lesions Lymph nodes: Cervical, supraclavicular, and axillary nodes normal.  Lab Results  Component Value Date   HGBA1C 5.6 11/08/2016    Lab Results  Component Value Date   CREATININE 1.6 (H) 06/20/2017   CREATININE 1.6 (H) 03/21/2017   CREATININE 1.5 (H) 12/20/2016    Lab Results  Component Value Date   WBC 5.3 06/20/2017   HGB 12.0 06/20/2017   HCT 36.2 06/20/2017   PLT 132 (L) 06/20/2017   GLUCOSE 136 (H) 06/20/2017   CHOL 150 07/13/2014   TRIG 114.0 07/13/2014   HDL 58.20 07/13/2014   LDLCALC 69 07/13/2014   ALT 16 06/20/2017   AST 21 06/20/2017   NA 143 06/20/2017   K 4.1 06/20/2017   CL 109 (H) 06/20/2017   CREATININE 1.6 (H) 06/20/2017   BUN 33 (H) 06/20/2017   CO2 25 06/20/2017   TSH 1.380 12/20/2016   INR 1.03 05/22/2016   HGBA1C 5.6 11/08/2016    No results found.  Assessment & Plan:   Problem List Items Addressed This Visit    Restless legs syndrome    Not well controlled.  Will increase dose of Requip to 2 mg daily       Obstructive hydrocephalus      Per neurology  Note,  She has a nonfunctioning VPS      Hypothyroidism - Primary   Relevant Orders   TSH   Hypertension (Chronic)    Reasonably Well controlled on current regimen. Renal function stable, no changes today.  Lab Results  Component Value Date   CREATININE 1.6 (H) 06/20/2017   Lab Results  Component Value Date   NA 143 06/20/2017   K 4.1 06/20/2017   CL 109 (H) 06/20/2017   CO2 25 06/20/2017  Hyperlipidemia   Relevant Orders   Lipid panel   History of Barrett's esophagus    By 2015 EGD.  Recommended to continue daily PPI      Constipation    Aggravated by use of tolterodine.  Discussed use of stool softener, bulk forming laxatives,  Cathartics,  And finally stimulant laxatives, when appropriate.       Cerebral aneurysm    With prior interventions for ruptured right ICA and thalamic ICA by CTA 11/27/2012.  Has been referred to [facility] neurosurgery for management and follow up            A total of 25 minutes of face to face time was spent with patient more than half of which was spent in counselling about the above mentioned conditions  and coordination of care  I have discontinued Ms. Vigo's fesoterodine. I have also changed her rOPINIRole. Additionally, I am having her maintain her aspirin, multivitamin, buPROPion, lisinopril, doxazosin, cloNIDine, hydrALAZINE, nitroGLYCERIN, letrozole, Melatonin, temazepam, mirtazapine, carvedilol, SYNTHROID, ranitidine, atorvastatin, and tolterodine.  Meds ordered this encounter  Medications  . rOPINIRole (REQUIP) 2 MG tablet    Sig: Take 1 tablet (2 mg total) by mouth at bedtime. TAKE 1 TABLET (1 MG TOTAL) BY MOUTH DAILY AS NEEDED.    Dispense:  90 tablet    Refill:  1    Medications Discontinued During This Encounter  Medication Reason  . fesoterodine (TOVIAZ) 4 MG TB24 tablet Patient has not taken in last 30 days  . rOPINIRole (REQUIP) 1 MG tablet Reorder    Follow-up: Return in about 6 months  (around 01/11/2018) for fasting labs needed 1 month .   Crecencio Mc, MD

## 2017-07-11 NOTE — Patient Instructions (Addendum)
There are 4 different types of medications to help constipation :    Docusate   Is a stool softener, you can take 200 mg each night. (brand name Colace)    2) Bulk forming laxatives include miralax, metamucil, fibercon, citrucel and benefiber .  These are gentle and work in 1 to 2 days to relieve constipation, and  you can also combine them daily with colace.  3)Dulcolax,  Sennakot or ExLax are stimulant laxatives.   do not use more than every 3-4 days   4) cathartic laxatives:  Milk of Magensium.  Citrate of magnesium.  Liquid TNT! Do not leave the house after you drink it!   Return at your leisure for fasting labs  I have increased your Requip to 2 mg daily for your restless legs

## 2017-07-13 DIAGNOSIS — G2581 Restless legs syndrome: Secondary | ICD-10-CM | POA: Insufficient documentation

## 2017-07-13 NOTE — Assessment & Plan Note (Signed)
Reasonably Well controlled on current regimen. Renal function stable, no changes today.  Lab Results  Component Value Date   CREATININE 1.6 (H) 06/20/2017   Lab Results  Component Value Date   NA 143 06/20/2017   K 4.1 06/20/2017   CL 109 (H) 06/20/2017   CO2 25 06/20/2017

## 2017-07-13 NOTE — Assessment & Plan Note (Addendum)
Aggravated by use of tolterodine.  Discussed use of stool softener, bulk forming laxatives,  Cathartics,  And finally stimulant laxatives, when appropriate.

## 2017-07-13 NOTE — Assessment & Plan Note (Signed)
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  Has been referred to [facility] neurosurgery for management and follow up

## 2017-07-13 NOTE — Assessment & Plan Note (Signed)
By 9357 EGD.  Recommended to continue daily PPI

## 2017-07-13 NOTE — Assessment & Plan Note (Signed)
Not well controlled.  Will increase dose of Requip to 2 mg daily

## 2017-07-13 NOTE — Assessment & Plan Note (Signed)
Per neurology  Note,  She has a nonfunctioning VPS

## 2017-07-25 ENCOUNTER — Other Ambulatory Visit (INDEPENDENT_AMBULATORY_CARE_PROVIDER_SITE_OTHER): Payer: Medicare Other

## 2017-07-25 ENCOUNTER — Other Ambulatory Visit: Payer: Self-pay | Admitting: Family

## 2017-07-25 DIAGNOSIS — C50912 Malignant neoplasm of unspecified site of left female breast: Secondary | ICD-10-CM

## 2017-07-25 DIAGNOSIS — E782 Mixed hyperlipidemia: Secondary | ICD-10-CM

## 2017-07-25 DIAGNOSIS — Z170 Estrogen receptor positive status [ER+]: Principal | ICD-10-CM

## 2017-07-25 DIAGNOSIS — C50612 Malignant neoplasm of axillary tail of left female breast: Secondary | ICD-10-CM

## 2017-07-25 DIAGNOSIS — E039 Hypothyroidism, unspecified: Secondary | ICD-10-CM

## 2017-07-25 LAB — TSH: TSH: 0.69 u[IU]/mL (ref 0.35–4.50)

## 2017-07-25 LAB — LIPID PANEL
Cholesterol: 142 mg/dL (ref 0–200)
HDL: 58.4 mg/dL (ref >39.00)
LDL Cholesterol: 58 mg/dL (ref 0–99)
NonHDL: 83.48
Total CHOL/HDL Ratio: 2
Triglycerides: 129 mg/dL (ref 0.0–149.0)
VLDL: 25.8 mg/dL (ref 0.0–40.0)

## 2017-07-28 ENCOUNTER — Encounter: Payer: Self-pay | Admitting: Internal Medicine

## 2017-07-30 ENCOUNTER — Inpatient Hospital Stay: Payer: Medicare Other | Attending: Radiation Oncology

## 2017-07-30 DIAGNOSIS — Z452 Encounter for adjustment and management of vascular access device: Secondary | ICD-10-CM | POA: Insufficient documentation

## 2017-07-30 DIAGNOSIS — C50812 Malignant neoplasm of overlapping sites of left female breast: Secondary | ICD-10-CM | POA: Insufficient documentation

## 2017-07-30 DIAGNOSIS — Z170 Estrogen receptor positive status [ER+]: Secondary | ICD-10-CM | POA: Diagnosis not present

## 2017-07-30 DIAGNOSIS — Z95828 Presence of other vascular implants and grafts: Secondary | ICD-10-CM

## 2017-07-30 MED ORDER — SODIUM CHLORIDE 0.9% FLUSH
10.0000 mL | INTRAVENOUS | Status: DC | PRN
  Administered 2017-07-30: 10 mL via INTRAVENOUS
  Filled 2017-07-30: qty 10

## 2017-07-30 MED ORDER — HEPARIN SOD (PORK) LOCK FLUSH 100 UNIT/ML IV SOLN
500.0000 [IU] | Freq: Once | INTRAVENOUS | Status: AC
  Administered 2017-07-30: 500 [IU] via INTRAVENOUS
  Filled 2017-07-30: qty 5

## 2017-08-12 ENCOUNTER — Other Ambulatory Visit: Payer: Self-pay | Admitting: Internal Medicine

## 2017-08-12 NOTE — Telephone Encounter (Signed)
Mailed unread message to patient. thanks 

## 2017-08-14 ENCOUNTER — Ambulatory Visit
Admission: RE | Admit: 2017-08-14 | Discharge: 2017-08-14 | Disposition: A | Discharge status: Home / Self Care | Payer: Medicare Other | Source: Ambulatory Visit | Attending: Family | Admitting: Family

## 2017-08-14 DIAGNOSIS — C50912 Malignant neoplasm of unspecified site of left female breast: Secondary | ICD-10-CM | POA: Diagnosis present

## 2017-08-14 DIAGNOSIS — C50612 Malignant neoplasm of axillary tail of left female breast: Secondary | ICD-10-CM

## 2017-08-14 DIAGNOSIS — Z170 Estrogen receptor positive status [ER+]: Secondary | ICD-10-CM | POA: Diagnosis present

## 2017-08-14 DIAGNOSIS — Z853 Personal history of malignant neoplasm of breast: Secondary | ICD-10-CM | POA: Insufficient documentation

## 2017-08-14 DIAGNOSIS — Z08 Encounter for follow-up examination after completed treatment for malignant neoplasm: Secondary | ICD-10-CM | POA: Diagnosis not present

## 2017-08-14 DIAGNOSIS — R922 Inconclusive mammogram: Secondary | ICD-10-CM | POA: Diagnosis not present

## 2017-08-26 ENCOUNTER — Other Ambulatory Visit: Payer: Self-pay

## 2017-08-26 MED ORDER — ATORVASTATIN CALCIUM 40 MG PO TABS: 40.0000 mg | ORAL_TABLET | Freq: Every day | ORAL | 1 refills | Status: DC

## 2017-09-01 ENCOUNTER — Telehealth: Payer: Self-pay | Admitting: *Deleted

## 2017-09-01 ENCOUNTER — Telehealth: Payer: Self-pay | Admitting: Internal Medicine

## 2017-09-01 DIAGNOSIS — R05 Cough: Secondary | ICD-10-CM | POA: Diagnosis not present

## 2017-09-01 DIAGNOSIS — R609 Edema, unspecified: Secondary | ICD-10-CM | POA: Diagnosis not present

## 2017-09-01 DIAGNOSIS — J309 Allergic rhinitis, unspecified: Secondary | ICD-10-CM | POA: Diagnosis not present

## 2017-09-01 NOTE — Telephone Encounter (Signed)
[facility]  Patient Name: Cassidy Ortiz  DOB: 08-07-1941    Initial Comment Caller states she has a cough, her eye is swollen, red and matted with discharge.   Nurse Assessment  Nurse: Holly Bodily, RN, Nicci Date/Time (Eastern Time): 09/01/2017 12:25:19 PM  Confirm and document reason for call. If symptomatic, describe symptoms. ---Caller states she has a cough, her right eye is swollen, red and matted with discharge and the other eye is also having the discharge  Does the patient have any new or worsening symptoms? ---Yes  Will a triage be completed? ---Yes  Related visit to physician within the last 2 weeks? ---No  Does the PT have any chronic conditions? (i.e. diabetes, asthma, etc.) ---Yes  List chronic conditions. ---HTN  Is this a behavioral health or substance abuse call? ---No     Guidelines    Guideline Title Affirmed Question Affirmed Notes  Eye - Swelling MODERATE-SEVERE eyelid swelling on one side (Exception: due to a mosquito bite)    Final Disposition User   See Physician within 24 Hours Corum, RN, Nicci    Comments  Patient would like to be seen today. Please call her back and talk with her and see if she can be worked in. Thank you.   Referrals  REFERRED TO PCP OFFICE   Caller Disagree/Comply Comply  Caller Understands Yes  PreDisposition Call Doctor

## 2017-09-01 NOTE — Telephone Encounter (Signed)
Pt currently has a swollen  right eye, the eye has a red sack underneath the eye. Both eyes were stuck together when she woke this morning. Pt also has a severe congestion. Pt transferred  to Nurse Line Pt contact [phone]

## 2017-09-01 NOTE — Telephone Encounter (Signed)
LMTCB

## 2017-09-01 NOTE — Telephone Encounter (Signed)
Attempted to reach patient x 2 

## 2017-09-02 NOTE — Telephone Encounter (Signed)
Patient stated she would go to urgent care to be evaluated.

## 2017-09-02 NOTE — Telephone Encounter (Signed)
Patient called stating that bilateral eyes are swollen. Left eye is worse than right but yesterday the right eye is worse than the left. Eyes are blood shot and vision is blurry. Patient also says that she has a nonproductive cough x4days. Patient vomited one time the night before last. Denies fever, nausea, headache.

## 2017-09-02 NOTE — Telephone Encounter (Signed)
What is swollen:  Her eyes or her eyelids?  Are her eyeballs hurting?  Any  Allergy symptoms (ithcing eyes,  Watering,  Sneezing )

## 2017-09-02 NOTE — Telephone Encounter (Signed)
No allergy symptoms. Eyes are swollen underneath and she has yellow/green drainage in both eyes and vision is blurry.

## 2017-09-02 NOTE — Telephone Encounter (Signed)
She may have conjunctivitis,  But she needs to be seen and  I have no appointments available.  plesae direct to another office or urgent care

## 2017-09-03 ENCOUNTER — Telehealth: Payer: Self-pay | Admitting: Cardiovascular Disease

## 2017-09-03 ENCOUNTER — Other Ambulatory Visit: Payer: Self-pay

## 2017-09-03 MED ORDER — CLONIDINE HCL 0.1 MG PO TABS: 0.1000 mg | ORAL_TABLET | Freq: Three times a day (TID) | ORAL | 0 refills | Status: DC

## 2017-09-03 NOTE — Telephone Encounter (Signed)
Requested Prescriptions   Signed Prescriptions Disp Refills  . cloNIDine (CATAPRES) 0.1 MG tablet 270 tablet 0    Sig: Take 1 tablet (0.1 mg total) by mouth 3 (three) times daily.    Authorizing Provider: Minna Merritts    Ordering User: Janan Ridge

## 2017-09-03 NOTE — Telephone Encounter (Signed)
°*  STAT* If patient is at the pharmacy, call can be transferred to refill team.   1. Which medications need to be refilled? (please list name of each medication and dose if known) Clonidine   2. Which pharmacy/location (including street and city if local pharmacy) is medication to be sent to?[facility]   3. Do they need a 30 day or 90 day supply? 90 day

## 2017-09-16 ENCOUNTER — Encounter: Payer: Self-pay | Admitting: Gastroenterology

## 2017-09-17 ENCOUNTER — Other Ambulatory Visit: Payer: Self-pay | Admitting: Cardiovascular Disease

## 2017-09-18 ENCOUNTER — Encounter: Payer: Self-pay | Admitting: Gastroenterology

## 2017-09-18 ENCOUNTER — Ambulatory Visit (INDEPENDENT_AMBULATORY_CARE_PROVIDER_SITE_OTHER): Payer: Medicare Other

## 2017-09-18 DIAGNOSIS — Z23 Encounter for immunization: Secondary | ICD-10-CM

## 2017-09-19 ENCOUNTER — Other Ambulatory Visit (HOSPITAL_BASED_OUTPATIENT_CLINIC_OR_DEPARTMENT_OTHER): Payer: Medicare Other

## 2017-09-19 ENCOUNTER — Ambulatory Visit (HOSPITAL_BASED_OUTPATIENT_CLINIC_OR_DEPARTMENT_OTHER): Payer: Medicare Other | Admitting: Hematology & Oncology

## 2017-09-19 ENCOUNTER — Ambulatory Visit (HOSPITAL_BASED_OUTPATIENT_CLINIC_OR_DEPARTMENT_OTHER): Payer: Medicare Other

## 2017-09-19 VITALS — BP 145/73 | HR 51 | Temp 98.6°F | Resp 20 | Wt 205.0 lb

## 2017-09-19 DIAGNOSIS — C50912 Malignant neoplasm of unspecified site of left female breast: Secondary | ICD-10-CM

## 2017-09-19 DIAGNOSIS — Z85528 Personal history of other malignant neoplasm of kidney: Secondary | ICD-10-CM

## 2017-09-19 DIAGNOSIS — Z170 Estrogen receptor positive status [ER+]: Secondary | ICD-10-CM | POA: Diagnosis not present

## 2017-09-19 DIAGNOSIS — Z86000 Personal history of in-situ neoplasm of breast: Secondary | ICD-10-CM | POA: Diagnosis not present

## 2017-09-19 DIAGNOSIS — D500 Iron deficiency anemia secondary to blood loss (chronic): Secondary | ICD-10-CM

## 2017-09-19 DIAGNOSIS — Z79811 Long term (current) use of aromatase inhibitors: Secondary | ICD-10-CM

## 2017-09-19 LAB — CMP (CANCER CENTER ONLY)
ALK PHOS: 91 U/L — AB (ref 26–84)
ALT: 20 U/L (ref 10–47)
AST: 19 U/L (ref 11–38)
Albumin: 3.2 g/dL — ABNORMAL LOW (ref 3.3–5.5)
BUN: 32 mg/dL — AB (ref 7–22)
CO2: 28 mEq/L (ref 18–33)
CREATININE: 1.8 mg/dL — AB (ref 0.6–1.2)
Calcium: 9.7 mg/dL (ref 8.0–10.3)
Chloride: 109 mEq/L — ABNORMAL HIGH (ref 98–108)
Glucose, Bld: 103 mg/dL (ref 73–118)
POTASSIUM: 4.5 meq/L (ref 3.3–4.7)
Sodium: 147 mEq/L — ABNORMAL HIGH (ref 128–145)
TOTAL PROTEIN: 6.7 g/dL (ref 6.4–8.1)
Total Bilirubin: 0.5 mg/dl (ref 0.20–1.60)

## 2017-09-19 LAB — CBC WITH DIFFERENTIAL (CANCER CENTER ONLY)
BASO#: 0 10*3/uL (ref 0.0–0.2)
BASO%: 0.7 % (ref 0.0–2.0)
EOS%: 2.9 % (ref 0.0–7.0)
Eosinophils Absolute: 0.2 10*3/uL (ref 0.0–0.5)
HCT: 35.4 % (ref 34.8–46.6)
HEMOGLOBIN: 11.7 g/dL (ref 11.6–15.9)
LYMPH#: 0.8 10*3/uL — AB (ref 0.9–3.3)
LYMPH%: 12.6 % — AB (ref 14.0–48.0)
MCH: 30.9 pg (ref 26.0–34.0)
MCHC: 33.1 g/dL (ref 32.0–36.0)
MCV: 93 fL (ref 81–101)
MONO#: 0.5 10*3/uL (ref 0.1–0.9)
MONO%: 8.7 % (ref 0.0–13.0)
NEUT%: 75.1 % (ref 39.6–80.0)
NEUTROS ABS: 4.6 10*3/uL (ref 1.5–6.5)
PLATELETS: 191 10*3/uL (ref 145–400)
RBC: 3.79 10*6/uL (ref 3.70–5.32)
RDW: 14.2 % (ref 11.1–15.7)
WBC: 6.1 10*3/uL (ref 3.9–10.0)

## 2017-09-19 MED ORDER — SODIUM CHLORIDE 0.9 % IJ SOLN
10.0000 mL | INTRAMUSCULAR | Status: DC | PRN
  Administered 2017-09-19: 10 mL
  Filled 2017-09-19: qty 10

## 2017-09-19 MED ORDER — HEPARIN SOD (PORK) LOCK FLUSH 100 UNIT/ML IV SOLN
500.0000 [IU] | Freq: Once | INTRAVENOUS | Status: AC | PRN
  Administered 2017-09-19: 500 [IU]
  Filled 2017-09-19: qty 5

## 2017-09-19 NOTE — Patient Instructions (Signed)
Implanted Port Home Guide An implanted port is a type of central line that is placed under the skin. Central lines are used to provide IV access when treatment or nutrition needs to be given through a person's veins. Implanted ports are used for long-term IV access. An implanted port may be placed because:  You need IV medicine that would be irritating to the small veins in your hands or arms.  You need long-term IV medicines, such as antibiotics.  You need IV nutrition for a long period.  You need frequent blood draws for lab tests.  You need dialysis.  Implanted ports are usually placed in the chest area, but they can also be placed in the upper arm, the abdomen, or the leg. An implanted port has two main parts:  Reservoir. The reservoir is round and will appear as a small, raised area under your skin. The reservoir is the part where a needle is inserted to give medicines or draw blood.  Catheter. The catheter is a thin, flexible tube that extends from the reservoir. The catheter is placed into a large vein. Medicine that is inserted into the reservoir goes into the catheter and then into the vein.  How will I care for my incision site? Do not get the incision site wet. Bathe or shower as directed by your health care provider. How is my port accessed? Special steps must be taken to access the port:  Before the port is accessed, a numbing cream can be placed on the skin. This helps numb the skin over the port site.  Your health care provider uses a sterile technique to access the port. ? Your health care provider must put on a mask and sterile gloves. ? The skin over your port is cleaned carefully with an antiseptic and allowed to dry. ? The port is gently pinched between sterile gloves, and a needle is inserted into the port.  Only "non-coring" port needles should be used to access the port. Once the port is accessed, a blood return should be checked. This helps ensure that the port  is in the vein and is not clogged.  If your port needs to remain accessed for a constant infusion, a clear (transparent) bandage will be placed over the needle site. The bandage and needle will need to be changed every week, or as directed by your health care provider.  Keep the bandage covering the needle clean and dry. Do not get it wet. Follow your health care provider's instructions on how to take a shower or bath while the port is accessed.  If your port does not need to stay accessed, no bandage is needed over the port.  What is flushing? Flushing helps keep the port from getting clogged. Follow your health care provider's instructions on how and when to flush the port. Ports are usually flushed with saline solution or a medicine called heparin. The need for flushing will depend on how the port is used.  If the port is used for intermittent medicines or blood draws, the port will need to be flushed: ? After medicines have been given. ? After blood has been drawn. ? As part of routine maintenance.  If a constant infusion is running, the port may not need to be flushed.  How long will my port stay implanted? The port can stay in for as long as your health care provider thinks it is needed. When it is time for the port to come out, surgery will be   done to remove it. The procedure is similar to the one performed when the port was put in. When should I seek immediate medical care? When you have an implanted port, you should seek immediate medical care if:  You notice a bad smell coming from the incision site.  You have swelling, redness, or drainage at the incision site.  You have more swelling or pain at the port site or the surrounding area.  You have a fever that is not controlled with medicine.  This information is not intended to replace advice given to you by your health care provider. Make sure you discuss any questions you have with your health care provider. Document  Released: 11/25/2005 Document Revised: 05/02/2016 Document Reviewed: 08/02/2013 Elsevier Interactive Patient Education  2017 Elsevier Inc.  
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
## 2017-10-07 DIAGNOSIS — R809 Proteinuria, unspecified: Secondary | ICD-10-CM | POA: Diagnosis not present

## 2017-10-07 DIAGNOSIS — N2581 Secondary hyperparathyroidism of renal origin: Secondary | ICD-10-CM | POA: Diagnosis not present

## 2017-10-07 DIAGNOSIS — I129 Hypertensive chronic kidney disease with stage 1 through stage 4 chronic kidney disease, or unspecified chronic kidney disease: Secondary | ICD-10-CM | POA: Diagnosis not present

## 2017-10-07 DIAGNOSIS — D631 Anemia in chronic kidney disease: Secondary | ICD-10-CM | POA: Diagnosis not present

## 2017-10-07 DIAGNOSIS — N183 Chronic kidney disease, stage 3 (moderate): Secondary | ICD-10-CM | POA: Diagnosis not present

## 2017-10-07 DIAGNOSIS — N189 Chronic kidney disease, unspecified: Secondary | ICD-10-CM | POA: Diagnosis not present

## 2017-10-13 ENCOUNTER — Other Ambulatory Visit: Payer: Self-pay | Admitting: Internal Medicine

## 2017-10-13 ENCOUNTER — Other Ambulatory Visit: Payer: Self-pay | Admitting: Hematology & Oncology

## 2017-10-13 DIAGNOSIS — E039 Hypothyroidism, unspecified: Secondary | ICD-10-CM

## 2017-10-13 DIAGNOSIS — Z170 Estrogen receptor positive status [ER+]: Principal | ICD-10-CM

## 2017-10-13 DIAGNOSIS — C50912 Malignant neoplasm of unspecified site of left female breast: Secondary | ICD-10-CM

## 2017-10-23 ENCOUNTER — Ambulatory Visit: Payer: Medicare Other

## 2017-11-07 ENCOUNTER — Inpatient Hospital Stay: Payer: Medicare Other | Attending: Radiation Oncology

## 2017-11-07 DIAGNOSIS — Z79810 Long term (current) use of selective estrogen receptor modulators (SERMs): Secondary | ICD-10-CM | POA: Diagnosis not present

## 2017-11-07 DIAGNOSIS — Z170 Estrogen receptor positive status [ER+]: Secondary | ICD-10-CM | POA: Diagnosis not present

## 2017-11-07 DIAGNOSIS — C50912 Malignant neoplasm of unspecified site of left female breast: Secondary | ICD-10-CM | POA: Diagnosis not present

## 2017-11-07 DIAGNOSIS — Z452 Encounter for adjustment and management of vascular access device: Secondary | ICD-10-CM | POA: Diagnosis not present

## 2017-11-07 DIAGNOSIS — Z95828 Presence of other vascular implants and grafts: Secondary | ICD-10-CM

## 2017-11-07 MED ORDER — HEPARIN SOD (PORK) LOCK FLUSH 100 UNIT/ML IV SOLN
500.0000 [IU] | INTRAVENOUS | Status: AC | PRN
  Administered 2017-11-07: 500 [IU]

## 2017-11-07 MED ORDER — SODIUM CHLORIDE 0.9% FLUSH
10.0000 mL | INTRAVENOUS | Status: AC | PRN
  Administered 2017-11-07: 10 mL
  Filled 2017-11-07: qty 10

## 2017-11-11 ENCOUNTER — Ambulatory Visit (AMBULATORY_SURGERY_CENTER): Payer: Self-pay

## 2017-11-11 VITALS — Ht 67.0 in | Wt 210.6 lb

## 2017-11-11 DIAGNOSIS — K2270 Barrett's esophagus without dysplasia: Secondary | ICD-10-CM

## 2017-11-11 NOTE — Progress Notes (Signed)
Per pt, no allergies to soy or egg products.Pt not taking any weight loss meds or using  O2 at home.   Pt refuse Emmi video. 

## 2017-11-12 ENCOUNTER — Other Ambulatory Visit: Payer: Self-pay | Admitting: Cardiovascular Disease

## 2017-11-13 DIAGNOSIS — G4700 Insomnia, unspecified: Secondary | ICD-10-CM | POA: Diagnosis not present

## 2017-11-13 DIAGNOSIS — F332 Major depressive disorder, recurrent severe without psychotic features: Secondary | ICD-10-CM | POA: Diagnosis not present

## 2017-11-14 ENCOUNTER — Other Ambulatory Visit: Payer: Self-pay | Admitting: Internal Medicine

## 2017-11-14 DIAGNOSIS — G4733 Obstructive sleep apnea (adult) (pediatric): Secondary | ICD-10-CM

## 2017-11-14 NOTE — Assessment & Plan Note (Signed)
Intolerant of full face mask .  Willing to try an alternative.  Sleep study ordered.

## 2017-11-17 ENCOUNTER — Ambulatory Visit: Payer: Medicare Other

## 2017-11-19 ENCOUNTER — Other Ambulatory Visit: Payer: Self-pay

## 2017-11-19 ENCOUNTER — Ambulatory Visit
Admission: RE | Admit: 2017-11-19 | Discharge: 2017-11-19 | Disposition: A | Discharge status: Home / Self Care | Payer: Medicare Other | Source: Ambulatory Visit | Attending: Radiation Oncology | Admitting: Radiation Oncology

## 2017-11-19 ENCOUNTER — Encounter: Payer: Self-pay | Admitting: Radiation Oncology

## 2017-11-19 VITALS — BP 177/87 | HR 65 | Temp 97.9°F | Resp 20 | Wt 210.2 lb

## 2017-11-19 DIAGNOSIS — Z923 Personal history of irradiation: Secondary | ICD-10-CM | POA: Diagnosis not present

## 2017-11-19 DIAGNOSIS — Z170 Estrogen receptor positive status [ER+]: Secondary | ICD-10-CM | POA: Insufficient documentation

## 2017-11-19 DIAGNOSIS — C50612 Malignant neoplasm of axillary tail of left female breast: Secondary | ICD-10-CM | POA: Insufficient documentation

## 2017-11-19 DIAGNOSIS — Z79811 Long term (current) use of aromatase inhibitors: Secondary | ICD-10-CM | POA: Diagnosis not present

## 2017-11-19 NOTE — Progress Notes (Signed)
Radiation Oncology Follow up Note  Name: Cassidy Ortiz   Date:   11/19/2017 MRN:  [id number] DOB: 1941/02/11    This 76 y.o. female presents to the clinic today for 1 year follow-up status post whole breast radiation to her left breast for stage I invasive mammary carcinoma.  REFERRING PROVIDER: Crecencio Mc, MD  HPI: Patient is a 76 year old female now 1 year out having completed whole breast radiation to her left breast for stage I invasive mammary carcinoma ER/PR positive. She is seen today in routine follow-up and is doing well. She specifically denies breast tenderness cough or bone pain. She is currently on. Femara tolerate that well without side effect. She had a mammogram back in September which I have reviewed his BI-RADS 2 benign.  COMPLICATIONS OF TREATMENT: none  FOLLOW UP COMPLIANCE: keeps appointments   PHYSICAL EXAM:  BP (!) 177/87   Pulse 65   Temp 97.9 F (36.6 C)   Resp 20   Wt 210 lb 3.3 oz (95.4 kg)   BMI 32.92 kg/m  Patients has a partial mastectomy of the right breast with inversion of the nipple. No dominant mass or nodularity is noted in either breast in 2 positions examined. No axillary or supraclavicular adenopathy is identified. Well-developed well-nourished patient in NAD. HEENT reveals PERLA, EOMI, discs not visualized.  Oral cavity is clear. No oral mucosal lesions are identified. Neck is clear without evidence of cervical or supraclavicular adenopathy. Lungs are clear to A&P. Cardiac examination is essentially unremarkable with regular rate and rhythm without murmur rub or thrill. Abdomen is benign with no organomegaly or masses noted. Motor sensory and DTR levels are equal and symmetric in the upper and lower extremities. Cranial nerves II through XII are grossly intact. Proprioception is intact. No peripheral adenopathy or edema is identified. No motor or sensory levels are noted. Crude visual fields are within normal range.  RADIOLOGY RESULTS: September  mammograms reviewed and compatible with the above-stated findings  PLAN: Present time patient is doing well with no evidence of disease. I'm please were overall progress. I have asked to see her back in 1 year for follow-up. She knows to call sooner with any concerns. She continues on Femara without side effect.  I would like to take this opportunity to thank you for allowing me to participate in the care of your patient.Noreene Filbert, MD

## 2017-11-20 ENCOUNTER — Ambulatory Visit (INDEPENDENT_AMBULATORY_CARE_PROVIDER_SITE_OTHER): Payer: Medicare Other

## 2017-11-20 VITALS — BP 142/78 | HR 53 | Temp 97.9°F | Resp 16 | Ht 67.0 in | Wt 209.0 lb

## 2017-11-20 DIAGNOSIS — Z0000 Encounter for general adult medical examination without abnormal findings: Secondary | ICD-10-CM | POA: Diagnosis not present

## 2017-11-20 NOTE — Progress Notes (Signed)
Subjective:   Cassidy Ortiz is a 76 y.o. female who presents for Medicare Annual (Subsequent) preventive examination.  Review of Systems:  No ROS.  Medicare Wellness Visit. Additional risk factors are reflected in the social history.  Cardiac Risk Factors include: advanced age (>13men, >37 women);hypertension;obesity (BMI >30kg/m2);diabetes mellitus     Objective:     Vitals: BP (!) 142/78 (BP Location: Right Arm, Patient Position: Sitting, Cuff Size: Normal)   Pulse (!) 53   Temp 97.9 F (36.6 C) (Oral)   Resp 16   Ht 5\' 7"  (1.702 m)   Wt 209 lb (94.8 kg)   SpO2 95%   BMI 32.73 kg/m   Body mass index is 32.73 kg/m.  Advanced Directives 11/20/2017 11/19/2017 09/19/2017 06/20/2017 06/20/2017 05/15/2017 03/21/2017  Does Patient Have a Medical Advance Directive? No No No No No No No  Type of Advance Directive - - - - - - -  Does patient want to make changes to medical advance directive? - - - - - - -  Would patient like information on creating a medical advance directive? No - Patient declined - No - Patient declined - - - No - Patient declined    Tobacco Social History   Tobacco Use  Smoking Status Former Smoker  . Packs/day: 1.00  . Years: 43.00  . Pack years: 43.00  . Types: Cigarettes  . Start date: 04/01/1964  . Last attempt to quit: 11/24/2007  . Years since quitting: 10.0  Smokeless Tobacco Never Used  Tobacco Comment   quit smoking 7 years ago     Counseling given: Not Answered Comment: quit smoking 7 years ago   Clinical Intake:  Pre-visit preparation completed: Yes  Pain : No/denies pain     Nutritional Status: BMI > 30  Obese Diabetes: No  How often do you need to have someone help you when you read instructions, pamphlets, or other written materials from your doctor or pharmacy?: 1 - Never  Interpreter Needed?: No     Past Medical History:  Diagnosis Date  . Abnormal weight gain   . Acquired cyst of kidney   . Anxiety state, unspecified     . Baker's cyst of knee    Left, pt does not remember  . Breast cancer ([facility]) 2001   RT LUMPECTOMY  . Breast cancer of upper-inner quadrant of left female breast ([facility]) 04/19/2016  . Cerebral aneurysm rupture ([facility]) 2008   stent  . Cerebral aneurysm, nonruptured 2008   s/p coil and shunt, performed in [place]  . Chronic diastolic CHF (congestive heart failure) ([facility])    a. 05/2016 Echo: EF 60-65%, no rwma, mild AI, nl RV fxn, mildly dil LA.  Marland Kitchen Chronic kidney disease, stage III (moderate) (HCC)   . Depressive disorder, not elsewhere classified   . Diabetes mellitus without complication ([place] [place])   . Diverticulosis of colon (without mention of hemorrhage)   . Duodenal mass   . Duodenal ulcer   . Edema   . Fatty liver   . Grave's disease   . Heart murmur   . HOCM (hypertrophic obstructive cardiomyopathy) ([facility])    a. 06/2012 Echo: EF 65-70%, sev LVH w/ dynamic obstruction, Gr1 DD, mild AI, mildly dil LA;  b. 05/2016 Echo: EF 60-65%, mild LVH, no rwma.  . Hypertension   . Hypertensive kidney disease, benign   . Hypothyroid   . Malignant neoplasm of breast (female), unspecified site 2001   right, s/p lumpectomy and XRT  . Malignant  neoplasm of kidney, except pelvis 2008   s/p partial nephrectomy  . Mixed hyperlipidemia   . Obstructive sleep apnea (adult) (pediatric)   . Personal history of colonic polyps 10/22/2002   hyperplastic   . Radiation 2001   BREAST CA  . Sleep apnea    off cpap  . Unspecified hypothyroidism   . Vitamin D deficiency    Past Surgical History:  Procedure Laterality Date  . ANEURYSM COILING     3 times  . BREAST BIOPSY Left 03/14/2016   stereo  . BREAST EXCISIONAL BIOPSY Left 04/2016   + rad and chemo   . BREAST EXCISIONAL BIOPSY Right 2001   rad and lumpectomy +  . BREAST LUMPECTOMY Right    right  . CATARACT EXTRACTION     bilateral  . COLONOSCOPY     last 2012.+ TA polyps  . CORONARY STENT PLACEMENT     brain  . CSF SHUNT  08/17/2008  . FEMORAL ARTERY  REPAIR  2011  . HEMIARTHROPLASTY SHOULDER FRACTURE     left  . PARTIAL NEPHRECTOMY     right  . RADIOACTIVE SEED GUIDED PARTIAL MASTECTOMY WITH AXILLARY SENTINEL LYMPH NODE BIOPSY Left 04/19/2016   Procedure: RADIOACTIVE SEED GUIDED PARTIAL MASTECTOMY WITH AXILLARY SENTINEL LYMPH NODE BIOPSY;  Surgeon: Fanny Skates, MD;  Location: [facility];  Service: General;  Laterality: Left;  . SHOULDER SURGERY     left  . UPPER GASTROINTESTINAL ENDOSCOPY  11-10-2014  . VAGINAL DELIVERY     3   Family History  Problem Relation Age of Onset  . Colon cancer Sister   . Cancer Sister        colon  . Heart disease Father   . Heart disease Mother   . Esophageal cancer Neg Hx   . Rectal cancer Neg Hx   . Stomach cancer Neg Hx    Social History   Socioeconomic History  . Marital status: Married    Spouse name: None  . Number of children: 3  . Years of education: None  . Highest education level: None  Social Needs  . Financial resource strain: Not hard at all  . Food insecurity - worry: Never true  . Food insecurity - inability: Never true  . Transportation needs - medical: No  . Transportation needs - non-medical: No  Occupational History  . Occupation: retired  Tobacco Use  . Smoking status: Former Smoker    Packs/day: 1.00    Years: 43.00    Pack years: 43.00    Types: Cigarettes    Start date: 04/01/1964    Last attempt to quit: 11/24/2007    Years since quitting: 10.0  . Smokeless tobacco: Never Used  . Tobacco comment: quit smoking 7 years ago  Substance and Sexual Activity  . Alcohol use: No  . Drug use: No  . Sexual activity: Not Currently  Other Topics Concern  . None  Social History Narrative   Lives in [place] with husband. From [place]. Children live [place] and [place].   Pets - 1 cat in home.      Work - retired Theme park manager, Educational psychologist   Diet - regular, limited calories          Outpatient Encounter Medications as of 11/20/2017  Medication Sig  . aspirin 81 MG  tablet Take 162 mg by mouth daily at 12 noon. Take at noon.  Marland Kitchen atorvastatin (LIPITOR) 40 MG tablet Take 1 tablet (40 mg total) by mouth daily.  Marland Kitchen  buPROPion (WELLBUTRIN SR) 100 MG 12 hr tablet Take 100 mg by mouth 2 (two) times daily.  . carvedilol (COREG) 12.5 MG tablet TAKE 1 TABLET TWICE A DAY  WITH MEALS.  . cloNIDine (CATAPRES) 0.1 MG tablet TAKE 1 TABLET 3 TIMES A DAY  . doxazosin (CARDURA) 4 MG tablet Take 4 mg by mouth every evening.   Marland Kitchen esomeprazole (NEXIUM) 40 MG capsule Take 40 mg by mouth daily at 12 noon.  . hydrALAZINE (APRESOLINE) 100 MG tablet TAKE 1 TABLET 3 TIMES A DAY  . hydrOXYzine (VISTARIL) 25 MG capsule 25 mg daily as needed.  Marland Kitchen letrozole (FEMARA) 2.5 MG tablet TAKE 1 TABLET DAILY  . lisinopril (PRINIVIL,ZESTRIL) 10 MG tablet Take 10 mg by mouth daily.  . Melatonin 5 MG TABS Take by mouth.  . mirtazapine (REMERON) 45 MG tablet Take 45 mg by mouth at bedtime.  . Multiple Vitamin (MULTIVITAMIN) tablet Take 1 tablet by mouth daily.    . nitroGLYCERIN (NITROSTAT) 0.4 MG SL tablet Place 1 tablet (0.4 mg total) under the tongue every 5 (five) minutes as needed for chest pain.  . ranitidine (ZANTAC) 150 MG capsule Take 1 capsule (150 mg total) by mouth 2 (two) times daily.  Marland Kitchen rOPINIRole (REQUIP) 1 MG tablet TAKE 1 TABLET (1 MG TOTAL) BY MOUTH DAILY AS NEEDED. (Patient taking differently: TAKE 3mg  DAILY)  . rOPINIRole (REQUIP) 2 MG tablet Take 1 tablet (2 mg total) by mouth at bedtime. TAKE 1 TABLET (1 MG TOTAL) BY MOUTH DAILY AS NEEDED.  Marland Kitchen SYNTHROID 175 MCG tablet TAKE 1 TABLET DAILY        (THYROID STIMULATING       HORMONE NEEDED PRIOR TO    ANY MORE REFILLS)  . temazepam (RESTORIL) 15 MG capsule Take 15 mg by mouth at bedtime.   . tolterodine (DETROL LA) 4 MG 24 hr capsule Take 1 capsule (4 mg total) by mouth daily.   Facility-Administered Encounter Medications as of 11/20/2017  Medication  . 0.9 %  sodium chloride infusion    Activities of Daily Living In your present  state of health, do you have any difficulty performing the following activities: 11/20/2017  Hearing? N  Vision? N  Difficulty concentrating or making decisions? Y  Comment Difficulty remembering at times; memory delay  Walking or climbing stairs? Y  Comment Fatigued after walking a great distance and/or climbing more than 10 stairs.  Dressing or bathing? N  Doing errands, shopping? N  Preparing Food and eating ? N  Using the Toilet? N  In the past six months, have you accidently leaked urine? Y  Comment Followed by Urology and PCP  Do you have problems with loss of bowel control? N  Managing your Medications? N  Managing your Finances? N  Housekeeping or managing your Housekeeping? N  Some recent data might be hidden    Patient Care Team: Crecencio Mc, MD as PCP - General (Internal Medicine) Minna Merritts, MD as Consulting Physician (Cardiology)    Assessment:   This is a routine wellness examination for [facility].The goal of the wellness visit is to assist the patient how to close the gaps in care and create a preventative care plan for the patient.   The roster of all physicians providing medical care to patient is listed in the Snapshot section of the chart.  Osteoporosis risk reviewed.    Safety issues reviewed; Smoke and carbon monoxide detectors in the home. No firearms in the home.  Wears  seatbelts when driving or riding with others. Patient does wear sunscreen or protective clothing when in direct sunlight. No violence in the home.  Patient is alert, normal appearance, oriented to person/place/and time. Correctly identified the president of the [place], recall of 2/3 words, and performing simple calculations. Displays appropriate judgement and can read correct time from watch face.   No new identified risk were noted.  No failures at ADL's or IADL's.   BMI- discussed the importance of a healthy diet, water intake and the benefits of aerobic exercise. Educational  material provided.   24 hour diet recall: Breakfast: bacon, eggs, english muffin Lunch: soup Dinner: salad, pasta  Daily fluid intake: 0 cups of caffeine, 2 cups of water  Dental- dentures.  Eye- Visual acuity not assessed per patient preference since they have regular follow up with the ophthalmologist.  Wears corrective lenses.  Sleep patterns- Sleeps 3 hours at night.  Taking sleep aid medication as prescribed.  Wakes feeling fatigued. Standard CPAP not in use; nose piece CPAP ordered. Sleep aid medications managed by Dr. Nicolasa Ducking.  TDAP vaccine deferred per patient preference.  Follow up with insurance.  Educational material provided.  Patient Concerns: None at this time. Follow up with PCP as needed.  Exercise Activities and Dietary recommendations Current Exercise Habits: The patient does not participate in regular exercise at present  Goals    . Increase physical activity     Walk for exercise    . INCREASE WATER INTAKE     Stay hydrated       Fall Risk Fall Risk  11/20/2017 11/19/2017 05/15/2017 11/28/2016 11/08/2016  Falls in the past year? No No No No No  Number falls in past yr: - - - - -  Injury with Fall? - - - - -  Risk Factor Category  - - - - -  Risk for fall due to : - - - - -   Depression Screen PHQ 2/9 Scores 11/20/2017 11/19/2017 05/15/2017 11/28/2016  PHQ - 2 Score 0 0 0 0  PHQ- 9 Score - - - -     Cognitive Function MMSE - Mini Mental State Exam 10/24/2015  Orientation to time 5  Orientation to Place 5  Registration 3  Attention/ Calculation 5  Recall 3  Language- name 2 objects 2  Language- repeat 1  Language- follow 3 step command 3  Language- read & follow direction 1  Write a sentence 1  Copy design 1  Total score 30     6CIT Screen 11/20/2017 10/23/2016  What Year? 0 points 0 points  What month? 0 points 0 points  What time? 0 points 0 points  Count back from 20 0 points 0 points  Months in reverse 0 points 0 points  Repeat  phrase 0 points -  Total Score 0 -    Immunization History  Administered Date(s) Administered  . Influenza Split 08/23/2012  . Influenza, High Dose Seasonal PF 09/12/2016, 09/18/2017  . Influenza,inj,Quad PF,6+ Mos 09/10/2013, 08/18/2014, 09/15/2015  . Pneumococcal Conjugate-13 07/13/2014  . Pneumococcal Polysaccharide-23 09/23/2011    Screening Tests Health Maintenance  Topic Date Due  . TETANUS/TDAP  02/11/1962  . MAMMOGRAM  08/15/2019  . COLONOSCOPY  11/19/2019  . INFLUENZA VACCINE  Completed  . DEXA SCAN  Completed      Plan:   End of life planning; Advanced aging; Advanced directives discussed.  No HCPOA/Living Will.  Additional information declined at this time.  I have personally reviewed and noted  the following in the patient's chart:   . Medical and social history . Use of alcohol, tobacco or illicit drugs  . Current medications and supplements . Functional ability and status . Nutritional status . Physical activity . Advanced directives . List of other physicians . Hospitalizations, surgeries, and ER visits in previous 12 months . Vitals . Screenings to include cognitive, depression, and falls . Referrals and appointments  In addition, I have reviewed and discussed with patient certain preventive protocols, quality metrics, and best practice recommendations. A written personalized care plan for preventive services as well as general preventive health recommendations were provided to patient.     OBrien-Blaney, Dillon Mcreynolds L, LPN  09/64/3838    I have reviewed the above information and agree with above.   Deborra Medina, MD

## 2017-11-20 NOTE — Patient Instructions (Addendum)
  Cassidy Ortiz , Thank you for taking time to come for your Medicare Wellness Visit. I appreciate your ongoing commitment to your health goals. Please review the following plan we discussed and let me know if I can assist you in the future.   Follow up with Dr. Derrel Nip as needed.    Bring a copy of your [place] and/or Living Will to be scanned into chart.  Have a great day!  These are the goals we discussed: Goals    . Increase physical activity     Walk for exercise    . INCREASE WATER INTAKE     Stay hydrated       This is a list of the screening recommended for you and due dates:  Health Maintenance  Topic Date Due  . Tetanus Vaccine  02/11/1962  . Mammogram  08/15/2019  . Colon Cancer Screening  11/19/2019  . Flu Shot  Completed  . DEXA scan (bone density measurement)  Completed

## 2017-11-22 ENCOUNTER — Other Ambulatory Visit: Payer: Self-pay | Admitting: Cardiovascular Disease

## 2017-11-24 NOTE — Telephone Encounter (Signed)
Please advise if ok to refill Doxasin.

## 2017-11-25 ENCOUNTER — Ambulatory Visit (AMBULATORY_SURGERY_CENTER): Payer: Medicare Other | Admitting: Gastroenterology

## 2017-11-25 ENCOUNTER — Other Ambulatory Visit: Payer: Self-pay

## 2017-11-25 ENCOUNTER — Encounter: Payer: Self-pay | Admitting: Gastroenterology

## 2017-11-25 VITALS — BP 170/81 | HR 58 | Temp 97.5°F | Resp 13 | Ht 67.0 in | Wt 210.0 lb

## 2017-11-25 DIAGNOSIS — K2990 Gastroduodenitis, unspecified, without bleeding: Secondary | ICD-10-CM

## 2017-11-25 DIAGNOSIS — K229 Disease of esophagus, unspecified: Secondary | ICD-10-CM

## 2017-11-25 DIAGNOSIS — K2270 Barrett's esophagus without dysplasia: Secondary | ICD-10-CM | POA: Diagnosis not present

## 2017-11-25 DIAGNOSIS — I10 Essential (primary) hypertension: Secondary | ICD-10-CM | POA: Diagnosis not present

## 2017-11-25 DIAGNOSIS — K2970 Gastritis, unspecified, without bleeding: Secondary | ICD-10-CM

## 2017-11-25 DIAGNOSIS — K219 Gastro-esophageal reflux disease without esophagitis: Secondary | ICD-10-CM | POA: Diagnosis not present

## 2017-11-25 DIAGNOSIS — G4733 Obstructive sleep apnea (adult) (pediatric): Secondary | ICD-10-CM | POA: Diagnosis not present

## 2017-11-25 DIAGNOSIS — I2510 Atherosclerotic heart disease of native coronary artery without angina pectoris: Secondary | ICD-10-CM | POA: Diagnosis not present

## 2017-11-25 MED ORDER — SODIUM CHLORIDE 0.9 % IV SOLN: 500.0000 mL | Freq: Once | INTRAVENOUS | Status: DC

## 2017-11-25 NOTE — Progress Notes (Signed)
Called to room to assist during endoscopic procedure.  Patient ID and intended procedure confirmed with present staff. Received instructions for my participation in the procedure from the performing physician.  

## 2017-11-25 NOTE — Progress Notes (Signed)
Pt's states no medical or surgical changes since previsit or office visit. 

## 2017-11-25 NOTE — Telephone Encounter (Signed)
Per office visit 06/17/17 the patient was taking Cardura 4 mg once daily. Please call the patient to confirm what dose she is taking as I do not see where this was changed since she saw Dr. Rockey Situ.   Thanks!

## 2017-11-25 NOTE — Progress Notes (Signed)
Report to PACU, RN, vss, BBS= Clear.  

## 2017-11-25 NOTE — Op Note (Signed)
[facility] Patient Name: Cassidy Ortiz Procedure Date: 11/25/2017 9:12 AM MRN: [id number] Endoscopist: Milus Banister , MD Age: 76 Referring MD:  Date of Birth: May 04, 1941 Gender: Female Account #: [id number] Procedure:                Upper GI endoscopy Indications:              Surveillance for malignancy due to personal history                            of Barrett's esophagus Medicines:                Monitored Anesthesia Care Procedure:                Pre-Anesthesia Assessment:                           - Prior to the procedure, a History and Physical                            was performed, and patient medications and                            allergies were reviewed. The patient's tolerance of                            previous anesthesia was also reviewed. The risks                            and benefits of the procedure and the sedation                            options and risks were discussed with the patient.                            All questions were answered, and informed consent                            was obtained. Prior Anticoagulants: The patient has                            taken no previous anticoagulant or antiplatelet                            agents. ASA Grade Assessment: II - A patient with                            mild systemic disease. After reviewing the risks                            and benefits, the patient was deemed in                            satisfactory condition to undergo the procedure.  After obtaining informed consent, the endoscope was                            passed under direct vision. Throughout the                            procedure, the patient's blood pressure, pulse, and                            oxygen saturations were monitored continuously. The                            Endoscope was introduced through the mouth, and                            advanced to the second part of  duodenum. The upper                            GI endoscopy was accomplished without difficulty.                            The patient tolerated the procedure well. Scope In: Scope Out: Findings:                 The Z-line was irregular but non-nodular. Biopsies                            were taken with a cold forceps for histology.                           There was very mild, non-specific gastritis.                            Previously biopsied and neg for H. pylori.                           There was mild duodenitis.                           The exam was otherwise without abnormality. Complications:            No immediate complications. Estimated blood loss:                            None. Estimated Blood Loss:     Estimated blood loss: none. Impression:               - Irregular but non-nodular Z-line. Biopsied.                           - Mild gastritis and duodenitis (chronic when                            compared to previous endoscopies). Recommendation:           - Patient has a contact number available for  emergencies. The signs and symptoms of potential                            delayed complications were discussed with the                            patient. Return to normal activities tomorrow.                            Written discharge instructions were provided to the                            patient.                           - Resume previous diet.                           - Continue present medications.                           - Await pathology results. Milus Banister, MD 11/25/2017 9:27:13 AM This report has been signed electronically.

## 2017-11-25 NOTE — Patient Instructions (Signed)
YOU HAD AN ENDOSCOPIC PROCEDURE TODAY AT [facility]:   Refer to the procedure report that was given to you for any specific questions about what was found during the examination.  If the procedure report does not answer your questions, please call your gastroenterologist to clarify.  If you requested that your care partner not be given the details of your procedure findings, then the procedure report has been included in a sealed envelope for you to review at your convenience later.  YOU SHOULD EXPECT: Some feelings of bloating in the abdomen. Passage of more gas than usual.  Walking can help get rid of the air that was put into your GI tract during the procedure and reduce the bloating. If you had a lower endoscopy (such as a colonoscopy or flexible sigmoidoscopy) you may notice spotting of blood in your stool or on the toilet paper. If you underwent a bowel prep for your procedure, you may not have a normal bowel movement for a few days.  Please Note:  You might notice some irritation and congestion in your nose or some drainage.  This is from the oxygen used during your procedure.  There is no need for concern and it should clear up in a day or so.  SYMPTOMS TO REPORT IMMEDIATELY:   Following upper endoscopy (EGD)  Vomiting of blood or coffee ground material  New chest pain or pain under the shoulder blades  Painful or persistently difficult swallowing  New shortness of breath  Fever of 100F or higher  Black, tarry-looking stools  For urgent or emergent issues, a gastroenterologist can be reached at any hour by calling [phone].   DIET:  We do recommend a small meal at first, but then you may proceed to your regular diet.  Drink plenty of fluids but you should avoid alcoholic beverages for 24 hours.  ACTIVITY:  You should plan to take it easy for the rest of today and you should NOT DRIVE or use heavy machinery until tomorrow (because of the sedation medicines used  during the test).    FOLLOW UP: Our staff will call the number listed on your records the next business day following your procedure to check on you and address any questions or concerns that you may have regarding the information given to you following your procedure. If we do not reach you, we will leave a message.  However, if you are feeling well and you are not experiencing any problems, there is no need to return our call.  We will assume that you have returned to your regular daily activities without incident.  If any biopsies were taken you will be contacted by phone or by letter within the next 1-3 weeks.  Please call us at [phone] if you have not heard about the biopsies in 3 weeks.   Await for biopsy results Gastritis (handout given)  SIGNATURES/CONFIDENTIALITY: You and/or your care partner have signed paperwork which will be entered into your electronic medical record.  These signatures attest to the fact that that the information above on your After Visit Summary has been reviewed and is understood.  Full responsibility of the confidentiality of this discharge information lies with you and/or your care-partner.

## 2017-11-26 ENCOUNTER — Telehealth: Payer: Self-pay | Admitting: *Deleted

## 2017-11-26 ENCOUNTER — Telehealth: Payer: Self-pay

## 2017-11-26 NOTE — Telephone Encounter (Signed)
Pt is taking Cardura 4 mg tablet . We were not original prescriber please advise If ok to refill.

## 2017-11-26 NOTE — Telephone Encounter (Signed)
No name or number identifier, left a voice mail.

## 2017-11-26 NOTE — Telephone Encounter (Signed)
  Follow up Call-  Call back number 11/25/2017  Post procedure Call Back phone  # [phone]  Permission to leave phone message Yes  Some recent data might be hidden     Patient questions:  Do you have a fever, pain , or abdominal swelling? No. Pain Score  0 *  Have you tolerated food without any problems? Yes.    Have you been able to return to your normal activities? Yes.    Do you have any questions about your discharge instructions: Diet   No. Medications  Yes.   Follow up visit  No.  Do you have questions or concerns about your Care? No.  Actions: * If pain score is 4 or above: No action needed, pain <4.

## 2017-11-26 NOTE — Telephone Encounter (Signed)
Ok to refill cardura at 4 mg- one tablet by mouth once daily.

## 2017-12-03 ENCOUNTER — Encounter: Payer: Self-pay | Admitting: Gastroenterology

## 2017-12-04 ENCOUNTER — Ambulatory Visit: Payer: Medicare Other | Admitting: Radiation Oncology

## 2017-12-16 ENCOUNTER — Ambulatory Visit: Payer: Medicare Other | Attending: Neurology

## 2017-12-16 DIAGNOSIS — G4733 Obstructive sleep apnea (adult) (pediatric): Secondary | ICD-10-CM | POA: Insufficient documentation

## 2017-12-16 DIAGNOSIS — F5101 Primary insomnia: Secondary | ICD-10-CM | POA: Insufficient documentation

## 2017-12-20 DIAGNOSIS — Z87891 Personal history of nicotine dependence: Secondary | ICD-10-CM | POA: Insufficient documentation

## 2017-12-20 NOTE — Progress Notes (Signed)
Cardiology Office Note  Date:  12/22/2017   ID:  Cassidy Ortiz, DOB 1941/02/19, MRN [id number]  PCP:  Crecencio Mc, MD   Chief Complaint  Patient presents with  . other    6 month follow up. Meds reviewed by the pt. verbally. 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  hospital for severe hypertension, in hindsight felt secondary to severe pain after she received Neulasta shot.   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  history of right renal cell carcinoma.   Finished treatment for invasive ductal carcinoma of the left breast.  Completed 4 rounds of chemotherapy and radiation Who presents for follow up of her PAD, and HOCM  In follow-up today she reports that she is doing well No regular exercise program, Taking care of husband who has diffuse body rash etiology unclear  Rare labile blood pressure This past week up to 342 systolic that resolved on its own down to 140 by sitting relaxing Reports she is compliant with her medications Denies any chest tightness  Previous symptoms of insomnia completed all of her chemotherapy Followed by oncology every several months  Lab work reviewed with her in detail HBA1C 5.6 Total chol in 07/2017: 142,   LDL 58  EKG on today's visit shows normal sinus rhythm with rate 65 bpm, no significant ST or T-wave changes  Past medical history went to the emergency room 04/03/2015 for severe hypertension Systolic pressures were greater than 200.  compliance with her medication She was not anxious, was not in pain, denied any stressors that could have contributed to her high blood pressure they gave her hydralazine IV, gave her her regular afternoon medications  creatinine 1.49, BUN 31, hematocrit 40, EKG with normal sinus rhythm, rate 56 bpm, CT scan of the head with no acute findings  EGD on December 3 and had severe hypertension prior to the procedure. EGD showed small region of Barrett's esophagus After she got home, blood pressure seem to improve down to her baseline in the 130 range Went in for colonoscopy 11/18/2014 and again had severe hypertension Again after the procedure, went home and blood pressure significantly improved  Echocardiogram showed severe LVH, mild outflow tract gradient. No mention of elevated right ventricular systolic pressures. Previous Problems with anemia before requiring IM iron, now resolved Previous EGD showed ulcer, H. Pylori negative.  Earlier in 2013, we discontinued the diltiazem and Her edema resolved.  She reports that in followup today, she is depressed. Recently started on Remeron for sleep over the past several months. Uncertain if this is helping but sleep is okay. She's not doing any exercise. Very sedentary. Feels her blood pressure is doing well but is not checking any numbers. Weight continues to trend upwards  previous assessment/evaluation at [facility] for history of aneurysms and shunt (leak?) showed that shunt is not working but it is not a problem. No recent carotid ultrasound  PMH:   has a past medical history of Abnormal weight gain, Acquired cyst of  kidney, Anxiety state, unspecified, Baker's cyst of knee, Breast cancer ([place]) (2001), Breast cancer of upper-inner quadrant of left female breast ([place]) (04/19/2016), Cerebral aneurysm rupture ([place]) (2008), Cerebral aneurysm, nonruptured (2008), Chronic diastolic CHF (congestive heart failure) ([place]), Chronic kidney disease, stage III  (moderate) ([place]), Depressive disorder, not elsewhere classified, Diabetes mellitus without complication ([place]), Diverticulosis of colon (without mention of hemorrhage), Duodenal mass, Duodenal ulcer, Edema, Fatty liver, Grave's disease, Heart murmur, HOCM (hypertrophic obstructive cardiomyopathy) ([place]), Hypertension, Hypertensive kidney disease, benign, Hypothyroid, Malignant neoplasm of breast (female), unspecified site (2001), Malignant neoplasm of kidney, except pelvis (2008), Mixed hyperlipidemia, Obstructive sleep apnea (adult) (pediatric), Personal history of colonic polyps (10/22/2002), Radiation (2001), Sleep apnea, Unspecified hypothyroidism, and Vitamin D deficiency.  PSH:    Past Surgical History:  Procedure Laterality Date  . ANEURYSM COILING     3 times  . BREAST BIOPSY Left 03/14/2016   stereo  . BREAST EXCISIONAL BIOPSY Left 04/2016   + rad and chemo   . BREAST EXCISIONAL BIOPSY Right 2001   rad and lumpectomy +  . BREAST LUMPECTOMY Right    right  . CATARACT EXTRACTION     bilateral  . COLONOSCOPY     last 2012.+ TA polyps  . CORONARY STENT PLACEMENT     brain  . CSF SHUNT  08/17/2008  . FEMORAL ARTERY REPAIR  2011  . HEMIARTHROPLASTY SHOULDER FRACTURE     left  . PARTIAL NEPHRECTOMY     right  . RADIOACTIVE SEED GUIDED PARTIAL MASTECTOMY WITH AXILLARY SENTINEL LYMPH NODE BIOPSY Left 04/19/2016   Procedure: RADIOACTIVE SEED GUIDED PARTIAL MASTECTOMY WITH AXILLARY SENTINEL LYMPH NODE BIOPSY;  Surgeon: Fanny Skates, MD;  Location: [place];  Service: General;  Laterality: Left;  . SHOULDER SURGERY     left  . UPPER GASTROINTESTINAL ENDOSCOPY  11-10-2014  . VAGINAL DELIVERY     3    Current Outpatient Medications  Medication Sig Dispense Refill  . aspirin 81 MG tablet Take 162 mg by mouth daily at 12 noon. Take at noon.    Marland Kitchen atorvastatin (LIPITOR) 40 MG tablet Take 1 tablet (40 mg total) by mouth daily. 90 tablet 1  . buPROPion (WELLBUTRIN SR) 100 MG  12 hr tablet Take 100 mg by mouth 2 (two) times daily.    . carvedilol (COREG) 12.5 MG tablet TAKE 1 TABLET TWICE A DAY  WITH MEALS. 180 tablet 3  . cloNIDine (CATAPRES) 0.1 MG tablet TAKE 1 TABLET 3 TIMES A DAY 270 tablet 0  . doxazosin (CARDURA) 4 MG tablet Take 4 mg by mouth every evening.     Marland Kitchen esomeprazole (NEXIUM) 40 MG capsule Take 40 mg by mouth daily at 12 noon.    . hydrALAZINE (APRESOLINE) 100 MG tablet TAKE 1 TABLET 3 TIMES A DAY 270 tablet 3  . hydrOXYzine (VISTARIL) 25 MG capsule 25 mg daily as needed.  1  . letrozole (FEMARA) 2.5 MG tablet TAKE 1 TABLET DAILY 90 tablet 3  . lisinopril (PRINIVIL,ZESTRIL) 10 MG tablet Take 10 mg by mouth daily.    . Melatonin 5 MG TABS Take by mouth.    . mirtazapine (REMERON) 45 MG tablet Take 45 mg by mouth at bedtime.    . Multiple Vitamin (MULTIVITAMIN) tablet Take 1 tablet by mouth daily.      . nitroGLYCERIN (NITROSTAT) 0.4 MG SL tablet Place 1 tablet (0.4 mg total) under the tongue every 5 (five) minutes as needed for chest pain. 25 tablet 3  .  rOPINIRole (REQUIP) 2 MG tablet Take 1 tablet (2 mg total) by mouth at bedtime. TAKE 1 TABLET (1 MG TOTAL) BY MOUTH DAILY AS NEEDED. 90 tablet 1  . SYNTHROID 175 MCG tablet TAKE 1 TABLET DAILY        (THYROID STIMULATING       HORMONE NEEDED PRIOR TO    ANY MORE REFILLS) 90 tablet 1  . temazepam (RESTORIL) 15 MG capsule Take 15 mg by mouth at bedtime.   0  . tolterodine (DETROL LA) 4 MG 24 hr capsule Take 1 capsule (4 mg total) by mouth daily. 30 capsule 11   Current Facility-Administered Medications  Medication Dose Route Frequency Provider Last Rate Last Dose  . 0.9 %  sodium chloride infusion  500 mL Intravenous Once Milus Banister, MD         Allergies:   Amlodipine   Social History:  The patient  reports that she quit smoking about 10 years ago. Her smoking use included cigarettes. She started smoking about 53 years ago. She has a 43.00 pack-year smoking history. she has never used  smokeless tobacco. She reports that she does not drink alcohol or use drugs.   Family History:   family history includes Cancer in her sister; Colon cancer in her sister; Heart disease in her father and mother.    Review of Systems: Review of Systems  Constitutional: Negative.   Respiratory: Negative.   Cardiovascular: Negative.   Gastrointestinal: Negative.   Musculoskeletal: Negative.   Neurological: Negative.   Psychiatric/Behavioral: Negative.   All other systems reviewed and are negative.    PHYSICAL EXAM: VS:  BP 140/80 (BP Location: Left Arm, Patient Position: Sitting, Cuff Size: Normal)   Pulse 64   Ht 5\' 7"  (1.702 m)   Wt 212 lb 8 oz (96.4 kg)   BMI 33.28 kg/m  , BMI Body mass index is 33.28 kg/m.  No significant change in exam GEN: Well nourished, well developed, in no acute distress , obese HEENT: normal  Neck: no JVD, carotid bruits, or masses Cardiac: RRR; no murmurs, rubs, or gallops,no edema  Respiratory:  clear to auscultation bilaterally, normal work of breathing GI: soft, nontender, nondistended, + BS MS: no deformity or atrophy  Skin: warm and dry, no rash Neuro:  Strength and sensation are intact Psych: euthymic mood, full affect    Recent Labs: 07/25/2017: TSH 0.69 09/19/2017: ALT(SGPT) 20; BUN, Bld 32; Creat 1.8; HGB 11.7; Platelets 191; Potassium 4.5; Sodium 147    Lipid Panel Lab Results  Component Value Date   CHOL 142 07/25/2017   HDL 58.40 07/25/2017   LDLCALC 58 07/25/2017   TRIG 129.0 07/25/2017      Wt Readings from Last 3 Encounters:  12/22/17 212 lb 8 oz (96.4 kg)  11/25/17 210 lb (95.3 kg)  11/20/17 209 lb (94.8 kg)       ASSESSMENT AND PLAN:  Hypertensive heart disease with heart failure (HCC) -  Blood pressure is well controlled on today's visit. No changes made to the medications She will take nitroglycerin for severe hypertension that is persistent Long history of labile pressures  Chronic diastolic CHF  (congestive heart failure) (HCC) Appears euvolemic No changes to her medications  Mixed hyperlipidemia Cholesterol is at goal on the current lipid regimen. No changes to the medications were made. Stable   Cerebral aneurysm, nonruptured Previously evaluated at [facility], felt not to be an issue associated with severe/labile hypertension. Denies having any symptoms  HOCM (hypertrophic  obstructive cardiomyopathy) (HCC) Asymptomatic, continue current medications No further intervention needed  Hypertensive urgency Long discussion with her, if blood pressure does not come down on its own, she could take nitroglycerin as needed for systolic pressures close to 200  S/P VP shunt Previously evaluated at [facility]  Insomnia due to medical condition Followed by psychiatry. 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 EKG 12-Lead     Signed, Esmond Plants, M.D., Ph.D. 12/22/2017  [place], [place]

## 2017-12-22 ENCOUNTER — Ambulatory Visit (INDEPENDENT_AMBULATORY_CARE_PROVIDER_SITE_OTHER): Payer: Medicare Other | Admitting: Cardiovascular Disease

## 2017-12-22 ENCOUNTER — Encounter: Payer: Self-pay | Admitting: Cardiovascular Disease

## 2017-12-22 VITALS — BP 140/80 | HR 64 | Ht 67.0 in | Wt 212.5 lb

## 2017-12-22 DIAGNOSIS — I609 Nontraumatic subarachnoid hemorrhage, unspecified: Secondary | ICD-10-CM | POA: Diagnosis not present

## 2017-12-22 DIAGNOSIS — I10 Essential (primary) hypertension: Secondary | ICD-10-CM

## 2017-12-22 DIAGNOSIS — R011 Cardiac murmur, unspecified: Secondary | ICD-10-CM

## 2017-12-22 DIAGNOSIS — F17200 Nicotine dependence, unspecified, uncomplicated: Secondary | ICD-10-CM

## 2017-12-22 DIAGNOSIS — E782 Mixed hyperlipidemia: Secondary | ICD-10-CM

## 2017-12-22 DIAGNOSIS — I421 Obstructive hypertrophic cardiomyopathy: Secondary | ICD-10-CM

## 2017-12-22 DIAGNOSIS — I671 Cerebral aneurysm, nonruptured: Secondary | ICD-10-CM | POA: Diagnosis not present

## 2017-12-22 DIAGNOSIS — G4733 Obstructive sleep apnea (adult) (pediatric): Secondary | ICD-10-CM | POA: Diagnosis not present

## 2017-12-22 NOTE — Patient Instructions (Signed)

## 2017-12-24 ENCOUNTER — Telehealth: Payer: Self-pay | Admitting: Internal Medicine

## 2017-12-24 ENCOUNTER — Encounter: Payer: Self-pay | Admitting: Internal Medicine

## 2017-12-24 ENCOUNTER — Ambulatory Visit (INDEPENDENT_AMBULATORY_CARE_PROVIDER_SITE_OTHER): Payer: Medicare Other | Admitting: Internal Medicine

## 2017-12-24 DIAGNOSIS — E669 Obesity, unspecified: Secondary | ICD-10-CM

## 2017-12-24 DIAGNOSIS — G4701 Insomnia due to medical condition: Secondary | ICD-10-CM

## 2017-12-24 DIAGNOSIS — G2581 Restless legs syndrome: Secondary | ICD-10-CM

## 2017-12-24 DIAGNOSIS — I10 Essential (primary) hypertension: Secondary | ICD-10-CM

## 2017-12-24 DIAGNOSIS — G4733 Obstructive sleep apnea (adult) (pediatric): Secondary | ICD-10-CM | POA: Diagnosis not present

## 2017-12-24 MED ORDER — ZOSTER VAC RECOMB ADJUVANTED 50 MCG/0.5ML IM SUSR: 0.5000 mL | Freq: Once | INTRAMUSCULAR | 1 refills | Status: AC

## 2017-12-24 NOTE — Assessment & Plan Note (Signed)
Labile, likely due to untreated OSA> no changes today.  Reassess after osa is treated

## 2017-12-24 NOTE — Assessment & Plan Note (Signed)
contineu current dose of 3 mg ,  Advised to take it 3 hours before bedtime

## 2017-12-24 NOTE — Assessment & Plan Note (Signed)
I have addressed  BMI and recommended a low glycemic index diet utilizing smaller more frequent meals to increase metabolism.  I have also recommended that patient start exercising with a goal of 30 minutes of aerobic exercise a minimum of 5 days per week.  

## 2017-12-24 NOTE — Patient Instructions (Signed)
I want you to start walking and lose 10 lbs this year!   I will prescribe something  to help  you tolerate the CPAP mask    The ShingRx vaccine is now available in local pharmacies and is much more protective thant Zostavaxs,  It is therefore ADVISED for all interested adults over 50 to prevent shingles

## 2017-12-24 NOTE — Progress Notes (Signed)
Subjective:  Patient ID: Cassidy Ortiz, female    DOB: May 06, 1941  Age: 77 y.o. MRN: [id number]  CC: Diagnoses of OSA (obstructive sleep apnea), Restless legs syndrome, Essential hypertension, Insomnia due to medical condition, and Obesity (BMI 30-39.9) were pertinent to this visit.  HPI Cassidy Ortiz presents for FOLLOW UP on hyperlipidemia, labile hypertension, and chronic fatigue and insomnia with history of snoring .  Mammogram sept 2018 by ocology  Sleept study last week  EGD Dec 2018  Labile hypertension  Since her last hospitalization .  Home bp up to 202,  then 180 then 140 (resolved over 2 hours (.  Has prn NTG to use per [facility].) prior use caused hypotension and prplonged hospitalization    restless  Legs getting worse and constant  Taking Requip; at last visit her dose had been increased to 3 mg  Dose currently being taken at bedtime.  Discussed need to take it earlier.  Still very fatigued, too fatigued to exercise,  Not sleeping well.  Wakes up at 3 am cannot go back to sleep, snores.  Had a split night sleep study last week.,  AHI was reduced to ) on 12 cm H20 . She has not tolerated CPAP In the past due to development of facial rash from the full face mask ,  And the persistent feeling of claustrophobia.   Weight gain 7 lbs since summer.  Not exercising   Sess Dr Rolly Salter for CKD   Lab Results  Component Value Date   HGBA1C 5.6 11/08/2016           Outpatient Medications Prior to Visit  Medication Sig Dispense Refill  . aspirin 81 MG tablet Take 162 mg by mouth daily at 12 noon. Take at noon.    Marland Kitchen atorvastatin (LIPITOR) 40 MG tablet Take 1 tablet (40 mg total) by mouth daily. 90 tablet 1  . buPROPion (WELLBUTRIN SR) 100 MG 12 hr tablet Take 100 mg by mouth 2 (two) times daily.    . carvedilol (COREG) 12.5 MG tablet TAKE 1 TABLET TWICE A DAY  WITH MEALS. 180 tablet 3  . cloNIDine (CATAPRES) 0.1 MG tablet TAKE 1 TABLET 3 TIMES A DAY 270 tablet 0  . doxazosin  (CARDURA) 4 MG tablet Take 4 mg by mouth every evening.     Marland Kitchen esomeprazole (NEXIUM) 40 MG capsule Take 40 mg by mouth daily at 12 noon.    . hydrALAZINE (APRESOLINE) 100 MG tablet TAKE 1 TABLET 3 TIMES A DAY 270 tablet 3  . hydrOXYzine (VISTARIL) 25 MG capsule 25 mg daily as needed.  1  . letrozole (FEMARA) 2.5 MG tablet TAKE 1 TABLET DAILY 90 tablet 3  . lisinopril (PRINIVIL,ZESTRIL) 10 MG tablet Take 10 mg by mouth daily.    . Melatonin 5 MG TABS Take by mouth.    . mirtazapine (REMERON) 45 MG tablet Take 45 mg by mouth at bedtime.    . Multiple Vitamin (MULTIVITAMIN) tablet Take 1 tablet by mouth daily.      . nitroGLYCERIN (NITROSTAT) 0.4 MG SL tablet Place 1 tablet (0.4 mg total) under the tongue every 5 (five) minutes as needed for chest pain. 25 tablet 3  . rOPINIRole (REQUIP) 2 MG tablet Take 1 tablet (2 mg total) by mouth at bedtime. TAKE 1 TABLET (1 MG TOTAL) BY MOUTH DAILY AS NEEDED. 90 tablet 1  . SYNTHROID 175 MCG tablet TAKE 1 TABLET DAILY        (THYROID STIMULATING  HORMONE NEEDED PRIOR TO    ANY MORE REFILLS) 90 tablet 1  . temazepam (RESTORIL) 15 MG capsule Take 15 mg by mouth at bedtime.   0  . tolterodine (DETROL LA) 4 MG 24 hr capsule Take 1 capsule (4 mg total) by mouth daily. 30 capsule 11   Facility-Administered Medications Prior to Visit  Medication Dose Route Frequency Provider Last Rate Last Dose  . 0.9 %  sodium chloride infusion  500 mL Intravenous Once Milus Banister, MD        Review of Systems;  Patient denies headache, fevers, malaise, unintentional weight loss, skin rash, eye pain, sinus congestion and sinus pain, sore throat, dysphagia,  hemoptysis , cough, dyspnea, wheezing, chest pain, palpitations, orthopnea, edema, abdominal pain, nausea, melena, diarrhea, constipation, flank pain, dysuria, hematuria, urinary  Frequency, nocturia, numbness, tingling, seizures,  Focal weakness, Loss of consciousness,  Tremor, insomnia, depression, anxiety, and  suicidal ideation.      Objective:  BP 138/76 (BP Location: Right Arm, Patient Position: Sitting, Cuff Size: Large)   Pulse 62   Temp 98.5 F (36.9 C) (Oral)   Resp 16   Ht 5\' 7"  (1.702 m)   Wt 211 lb 12.8 oz (96.1 kg)   SpO2 91%   BMI 33.17 kg/m   BP Readings from Last 3 Encounters:  12/24/17 138/76  12/22/17 140/80  11/25/17 (!) 170/81    Wt Readings from Last 3 Encounters:  12/24/17 211 lb 12.8 oz (96.1 kg)  12/22/17 212 lb 8 oz (96.4 kg)  11/25/17 210 lb (95.3 kg)    General appearance: alert, cooperative and appears stated age Ears: normal TM's and external ear canals both ears Throat: lips, mucosa, and tongue normal; teeth and gums normal Neck: no adenopathy, no carotid bruit, supple, symmetrical, trachea midline and thyroid not enlarged, symmetric, no tenderness/mass/nodules Back: symmetric, no curvature. ROM normal. No CVA tenderness. Lungs: clear to auscultation bilaterally Heart: regular rate and rhythm, S1, S2 normal, no murmur, click, rub or gallop Abdomen: soft, non-tender; bowel sounds normal; no masses,  no organomegaly Pulses: 2+ and symmetric Skin: Skin color, texture, turgor normal. No rashes or lesions Lymph nodes: Cervical, supraclavicular, and axillary nodes normal.  Lab Results  Component Value Date   HGBA1C 5.6 11/08/2016    Lab Results  Component Value Date   CREATININE 1.8 (H) 09/19/2017   CREATININE 1.6 (H) 06/20/2017   CREATININE 1.6 (H) 03/21/2017    Lab Results  Component Value Date   WBC 6.1 09/19/2017   HGB 11.7 09/19/2017   HCT 35.4 09/19/2017   PLT 191 09/19/2017   GLUCOSE 103 09/19/2017   CHOL 142 07/25/2017   TRIG 129.0 07/25/2017   HDL 58.40 07/25/2017   LDLCALC 58 07/25/2017   ALT 20 09/19/2017   AST 19 09/19/2017   NA 147 (H) 09/19/2017   K 4.5 09/19/2017   CL 109 (H) 09/19/2017   CREATININE 1.8 (H) 09/19/2017   BUN 32 (H) 09/19/2017   CO2 28 09/19/2017   TSH 0.69 07/25/2017   INR 1.03 05/22/2016   HGBA1C  5.6 11/08/2016    No results found.  Assessment & Plan:   Problem List Items Addressed This Visit    Hypertension (Chronic)    Labile, likely due to untreated OSA> no changes today.  Reassess after osa is treated       Insomnia    Untreated due to untreated OSA .  Will prescribe alprazolam once she has received the CPAP machine  Obesity (BMI 30-39.9)    I have addressed  BMI and recommended a low glycemic index diet utilizing smaller more frequent meals to increase metabolism.  I have also recommended that patient start exercising with a goal of 30 minutes of aerobic exercise a minimum of 5 days per week.       OSA (obstructive sleep apnea)   Restless legs syndrome    contineu current dose of 3 mg ,  Advised to take it 3 hours before bedtime        A total of 25 minutes of face to face time was spent with patient more than half of which was spent in counselling about the above mentioned conditions  and coordination of care   I am having Olanda Poli start on Zoster Vaccine Adjuvanted. I am also having her maintain her aspirin, multivitamin, buPROPion, lisinopril, doxazosin, nitroGLYCERIN, Melatonin, temazepam, mirtazapine, carvedilol, tolterodine, rOPINIRole, atorvastatin, hydrALAZINE, hydrOXYzine, SYNTHROID, letrozole, esomeprazole, and cloNIDine. We will continue to administer sodium chloride.  Meds ordered this encounter  Medications  . Zoster Vaccine Adjuvanted [facility]) injection    Sig: Inject 0.5 mLs into the muscle once for 1 dose.    Dispense:  1 each    Refill:  1    There are no discontinued medications.  Follow-up: Return in about 6 months (around 06/23/2018).   Crecencio Mc, MD

## 2017-12-24 NOTE — Assessment & Plan Note (Signed)
Untreated due to untreated OSA .  Will prescribe alprazolam once she has received the CPAP machine

## 2017-12-25 DIAGNOSIS — N183 Chronic kidney disease, stage 3 (moderate): Secondary | ICD-10-CM | POA: Diagnosis not present

## 2017-12-25 DIAGNOSIS — N2581 Secondary hyperparathyroidism of renal origin: Secondary | ICD-10-CM | POA: Diagnosis not present

## 2017-12-25 DIAGNOSIS — I129 Hypertensive chronic kidney disease with stage 1 through stage 4 chronic kidney disease, or unspecified chronic kidney disease: Secondary | ICD-10-CM | POA: Diagnosis not present

## 2017-12-28 NOTE — Telephone Encounter (Signed)
Chart closed

## 2018-01-21 ENCOUNTER — Inpatient Hospital Stay: Payer: Medicare Other

## 2018-01-21 ENCOUNTER — Encounter: Payer: Self-pay | Admitting: Hematology & Oncology

## 2018-01-21 ENCOUNTER — Inpatient Hospital Stay: Payer: Medicare Other | Attending: Hematology & Oncology | Admitting: Hematology & Oncology

## 2018-01-21 ENCOUNTER — Other Ambulatory Visit: Payer: Self-pay

## 2018-01-21 DIAGNOSIS — Z853 Personal history of malignant neoplasm of breast: Secondary | ICD-10-CM | POA: Insufficient documentation

## 2018-01-21 DIAGNOSIS — C50912 Malignant neoplasm of unspecified site of left female breast: Secondary | ICD-10-CM | POA: Diagnosis not present

## 2018-01-21 DIAGNOSIS — Z85528 Personal history of other malignant neoplasm of kidney: Secondary | ICD-10-CM | POA: Insufficient documentation

## 2018-01-21 DIAGNOSIS — Z170 Estrogen receptor positive status [ER+]: Principal | ICD-10-CM

## 2018-01-21 DIAGNOSIS — Z961 Presence of intraocular lens: Secondary | ICD-10-CM | POA: Diagnosis not present

## 2018-01-21 DIAGNOSIS — H35033 Hypertensive retinopathy, bilateral: Secondary | ICD-10-CM | POA: Diagnosis not present

## 2018-01-21 DIAGNOSIS — C50911 Malignant neoplasm of unspecified site of right female breast: Secondary | ICD-10-CM

## 2018-01-21 LAB — CMP (CANCER CENTER ONLY)
ALT: 15 U/L (ref 0–55)
ANION GAP: 10 (ref 3–11)
AST: 17 U/L (ref 5–34)
Albumin: 3.4 g/dL — ABNORMAL LOW (ref 3.5–5.0)
Alkaline Phosphatase: 115 U/L (ref 40–150)
BUN: 39 mg/dL — ABNORMAL HIGH (ref 7–26)
CALCIUM: 9 mg/dL (ref 8.4–10.4)
CHLORIDE: 112 mmol/L — AB (ref 98–109)
CO2: 23 mmol/L (ref 22–29)
Creatinine: 1.86 mg/dL — ABNORMAL HIGH (ref 0.60–1.10)
GFR, EST AFRICAN AMERICAN: 30 mL/min — AB (ref >60)
GFR, Estimated: 26 mL/min — ABNORMAL LOW (ref >60)
Glucose, Bld: 116 mg/dL (ref 70–140)
Potassium: 4.2 mmol/L (ref 3.5–5.1)
SODIUM: 145 mmol/L (ref 136–145)
Total Bilirubin: 0.3 mg/dL (ref 0.2–1.2)
Total Protein: 6.3 g/dL — ABNORMAL LOW (ref 6.4–8.3)

## 2018-01-21 LAB — CBC WITH DIFFERENTIAL (CANCER CENTER ONLY)
Basophils Absolute: 0 10*3/uL (ref 0.0–0.1)
Basophils Relative: 0 %
EOS ABS: 0.2 10*3/uL (ref 0.0–0.5)
EOS PCT: 3 %
HCT: 36.7 % (ref 34.8–46.6)
Hemoglobin: 12 g/dL (ref 11.6–15.9)
LYMPHS ABS: 1.3 10*3/uL (ref 0.9–3.3)
LYMPHS PCT: 22 %
MCH: 29.9 pg (ref 26.0–34.0)
MCHC: 32.7 g/dL (ref 32.0–36.0)
MCV: 91.5 fL (ref 81.0–101.0)
MONO ABS: 0.5 10*3/uL (ref 0.1–0.9)
MONOS PCT: 8 %
Neutro Abs: 3.9 10*3/uL (ref 1.5–6.5)
Neutrophils Relative %: 67 %
PLATELETS: 135 10*3/uL — AB (ref 145–400)
RBC: 4.01 MIL/uL (ref 3.70–5.32)
RDW: 14.2 % (ref 11.1–15.7)
WBC: 5.8 10*3/uL (ref 3.9–10.0)

## 2018-01-21 NOTE — Patient Instructions (Signed)

## 2018-01-21 NOTE — Progress Notes (Signed)
Hematology and Oncology Follow Up Visit  Cassidy Ortiz [id number] 12/26/1940 77 y.o. 01/21/2018   Principle Diagnosis:  1. 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 2. 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 Intermittent iron-deficiency anemia 4.  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 Nantz is here today with her husband for follow-up.  She feels pretty good.  She got through the fall and holiday season without any problems.  She and her husband are going down to [place] in late May for a nephew's high school graduation.  She is gaining weight.  She really does not exercise all that much.  She has had no nausea or vomiting.  She has had no change in bowel or bladder habits.  She is had no bleeding.  There is no cough or shortness of breath.  She is not smoking.  Overall, her performance status is ECOG 1.   Medications:  Allergies as of 01/21/2018      Reactions   Amlodipine    Leg swelling      Medication List        Accurate as of 01/21/18  2:41 PM. Always use your most recent med list.          aspirin 81 MG tablet Take 162 mg by mouth daily at 12 noon. Take at noon.   atorvastatin 40 MG tablet Commonly known as:  LIPITOR Take 1 tablet (40 mg total) by mouth daily.   buPROPion 100 MG 12 hr tablet Commonly known as:  WELLBUTRIN SR Take 100 mg by mouth 2 (two) times daily.   carvedilol 12.5 MG tablet Commonly known as:  COREG TAKE 1 TABLET TWICE A DAY  WITH MEALS.   cloNIDine 0.1 MG tablet Commonly known as:  CATAPRES TAKE 1 TABLET 3 TIMES A DAY   doxazosin 4 MG tablet Commonly known as:  CARDURA Take 4 mg by mouth every evening.   esomeprazole 40 MG capsule Commonly known as:   NEXIUM Take 40 mg by mouth daily at 12 noon.   hydrALAZINE 100 MG tablet Commonly known as:  APRESOLINE TAKE 1 TABLET 3 TIMES A DAY   hydrOXYzine 25 MG capsule Commonly known as:  VISTARIL 25 mg daily as needed.   letrozole 2.5 MG tablet Commonly known as:  FEMARA TAKE 1 TABLET DAILY   lisinopril 10 MG tablet Commonly known as:  PRINIVIL,ZESTRIL Take 10 mg by mouth daily.   Melatonin 5 MG Tabs Take by mouth.   mirtazapine 45 MG tablet Commonly known as:  REMERON Take 45 mg by mouth at bedtime.   multivitamin tablet Take 1 tablet by mouth daily.   nitroGLYCERIN 0.4 MG SL tablet Commonly known as:  NITROSTAT Place 1 tablet (0.4 mg total) under the tongue every 5 (five) minutes as needed for chest pain.   rOPINIRole 2 MG tablet Commonly known as:  REQUIP Take 1 tablet (2 mg total) by mouth at bedtime. TAKE 1 TABLET (1 MG TOTAL) BY MOUTH DAILY AS NEEDED.   SYNTHROID 175 MCG tablet Generic drug:  levothyroxine TAKE 1 TABLET DAILY        (THYROID STIMULATING       HORMONE NEEDED PRIOR  TO    ANY MORE REFILLS)   temazepam 15 MG capsule Commonly known as:  RESTORIL Take 15 mg by mouth at bedtime.   tolterodine 4 MG 24 hr capsule Commonly known as:  DETROL LA Take 1 capsule (4 mg total) by mouth daily.       Allergies:  Allergies  Allergen Reactions  . Amlodipine     Leg swelling    Past Medical History, Surgical history, Social history, and Family History were reviewed and updated.  Review of Systems: Review of Systems  Constitutional: Positive for malaise/fatigue.  HENT: Negative.   Eyes: Negative.   Respiratory: Negative.   Cardiovascular: Negative.   Gastrointestinal: Negative.   Genitourinary: Negative.   Musculoskeletal: Positive for myalgias.  Skin: Negative.   Neurological: Negative.   Endo/Heme/Allergies: Negative.   Psychiatric/Behavioral: Negative.      Physical Exam:  vitals were not taken for this visit.   Wt Readings from Last 3  Encounters:  01/21/18 213 lb (96.6 kg)  12/24/17 211 lb 12.8 oz (96.1 kg)  12/22/17 212 lb 8 oz (96.4 kg)   Physical Exam  Constitutional: She is oriented to person, place, and time.  HENT:  Head: Normocephalic and atraumatic.  Mouth/Throat: Oropharynx is clear and moist.  Eyes: EOM are normal. Pupils are equal, round, and reactive to light.  Neck: Normal range of motion.  Cardiovascular: Normal rate, regular rhythm and normal heart sounds.  Pulmonary/Chest: Effort normal and breath sounds normal.  Abdominal: Soft. Bowel sounds are normal.  Musculoskeletal: Normal range of motion. She exhibits no edema, tenderness or deformity.  Lymphadenopathy:    She has no cervical adenopathy.  Neurological: She is alert and oriented to person, place, and time.  Skin: Skin is warm and dry. No rash noted. No erythema.  Psychiatric: She has a normal mood and affect. Her behavior is normal. Judgment and thought content normal.  Vitals reviewed.   Lab Results  Component Value Date   WBC 5.8 01/21/2018   HGB 11.7 09/19/2017   HCT 36.7 01/21/2018   MCV 91.5 01/21/2018   PLT 135 (L) 01/21/2018   Lab Results  Component Value Date   FERRITIN 53 04/08/2016   IRON 57 04/08/2016   TIBC 219 (L) 04/08/2016   UIBC 162 04/08/2016   IRONPCTSAT 26 04/08/2016   Lab Results  Component Value Date   RBC 4.01 01/21/2018   No results found for: KPAFRELGTCHN, LAMBDASER, KAPLAMBRATIO No results found for: IGGSERUM, IGA, IGMSERUM No results found for: Odetta Pink, SPEI   Chemistry      Component Value Date/Time   NA 145 01/21/2018 1150   NA 147 (H) 09/19/2017 1044   NA 144 11/08/2016 1059   K 4.2 01/21/2018 1150   K 4.5 09/19/2017 1044   K 3.9 11/08/2016 1059   CL 112 (H) 01/21/2018 1150   CL 109 (H) 09/19/2017 1044   CO2 23 01/21/2018 1150   CO2 28 09/19/2017 1044   CO2 24 11/08/2016 1059   BUN 39 (H) 01/21/2018 1150   BUN 32 (H) 09/19/2017  1044   BUN 35.7 (H) 11/08/2016 1059   CREATININE 1.86 (H) 01/21/2018 1150   CREATININE 1.8 (H) 09/19/2017 1044   CREATININE 1.4 (H) 11/08/2016 1059      Component Value Date/Time   CALCIUM 9.0 01/21/2018 1150   CALCIUM 9.7 09/19/2017 1044   CALCIUM 9.4 11/08/2016 1059   ALKPHOS 115 01/21/2018 1150   ALKPHOS 91 (H) 09/19/2017 1044  ALKPHOS 101 11/08/2016 1059   AST 17 01/21/2018 1150   AST 13 11/08/2016 1059   ALT 15 01/21/2018 1150   ALT 20 09/19/2017 1044   ALT 11 11/08/2016 1059   BILITOT 0.3 01/21/2018 1150   BILITOT 0.33 11/08/2016 1059      Impression and Plan: Ms. Neto is a very pleasant 77 yo caucasian female with history of stage I ductal carcinoma of the right breast. She also has history of renal cell carcinoma. She has since developed a new invasive ductal carcinoma of the left breast with a high Oncotype score. She had a lumpectomy followed by 4 cycles of chemotherapy. This was completed in August 2017 This was followed by radiation.   For right now, I do not see that there is any evidence of recurrent disease.  I told her that she is in remission.  We talked about remission versus cure.  I said that it is still too early to say that she is cured.  We will plan to have her come back to see [place] in another 3 months.  She gets her a Port-A-Cath flushed at the [facility] in 6 weeks.Marland Kitchen     Volanda Napoleon, MD 2/13/20192:41 PM

## 2018-01-22 ENCOUNTER — Telehealth: Payer: Self-pay | Admitting: Cardiovascular Disease

## 2018-01-22 MED ORDER — NITROGLYCERIN 0.4 MG SL SUBL: 0.4000 mg | SUBLINGUAL_TABLET | SUBLINGUAL | 3 refills | Status: DC | PRN

## 2018-01-22 NOTE — Telephone Encounter (Signed)
°*  STAT* If patient is at the pharmacy, call can be transferred to refill team.   1. Which medications need to be refilled? (please list name of each medication and dose if known) Nitrostat 0.4 mg SL q 5 min prn   2. Which pharmacy/location (including street and city if local pharmacy) is medication to be sent to?[facility] [address] [place]   3. Do they need a 30 day or 90 day supply? [place]

## 2018-01-22 NOTE — Telephone Encounter (Signed)
Refill sent in to pharmacy listed.

## 2018-02-05 ENCOUNTER — Ambulatory Visit: Payer: Self-pay

## 2018-02-05 NOTE — Telephone Encounter (Signed)
Patient called with c/o "restless legs." She says "I am calling because the medicine I take for restless legs is not working and I need something else ordered." I asked her to explain what is going on, she says "my feet just won't stay still and it's driving me crazy. I don't know what else to do." There is no protocol for restless legs, disposition based on my judgement is to see PCP within 2 weeks, appointment made by Lorriane Shire at practice for Tuesday, 02/09/18 at 1700 in Same Day slot. I was unable to make appointment due to schedule was full. I advised the patient that she will only be able to discuss the restless legs at this visit due to the type of visit it is and that if there are any other issues that come up to call the office back to schedule another visit, she verbalized understanding.

## 2018-02-10 ENCOUNTER — Ambulatory Visit (INDEPENDENT_AMBULATORY_CARE_PROVIDER_SITE_OTHER): Payer: Medicare Other | Admitting: Internal Medicine

## 2018-02-10 ENCOUNTER — Encounter: Payer: Self-pay | Admitting: Internal Medicine

## 2018-02-10 VITALS — BP 140/70 | HR 63 | Temp 98.5°F | Resp 15 | Ht 67.0 in | Wt 215.0 lb

## 2018-02-10 DIAGNOSIS — R609 Edema, unspecified: Secondary | ICD-10-CM | POA: Diagnosis not present

## 2018-02-10 DIAGNOSIS — G4701 Insomnia due to medical condition: Secondary | ICD-10-CM

## 2018-02-10 DIAGNOSIS — E611 Iron deficiency: Secondary | ICD-10-CM | POA: Diagnosis not present

## 2018-02-10 DIAGNOSIS — G2581 Restless legs syndrome: Secondary | ICD-10-CM | POA: Diagnosis not present

## 2018-02-10 DIAGNOSIS — I10 Essential (primary) hypertension: Secondary | ICD-10-CM | POA: Diagnosis not present

## 2018-02-10 DIAGNOSIS — N182 Chronic kidney disease, stage 2 (mild): Secondary | ICD-10-CM | POA: Diagnosis not present

## 2018-02-10 DIAGNOSIS — R351 Nocturia: Secondary | ICD-10-CM | POA: Diagnosis not present

## 2018-02-10 MED ORDER — ROPINIROLE HCL 4 MG PO TABS: 4.0000 mg | ORAL_TABLET | Freq: Every day | ORAL | 3 refills | Status: DC

## 2018-02-10 MED ORDER — FUROSEMIDE 20 MG PO TABS: ORAL_TABLET | ORAL | 3 refills | Status: DC

## 2018-02-10 MED ORDER — LISINOPRIL 20 MG PO TABS: 20.0000 mg | ORAL_TABLET | Freq: Every day | ORAL | 2 refills | Status: DC

## 2018-02-10 NOTE — Progress Notes (Signed)
Subjective:  Patient ID: Cassidy Ortiz, female    DOB: 04-20-1941  Age: 77 y.o. MRN: [id number]  CC: The primary encounter diagnosis was Edema, unspecified type. Diagnoses of Iron deficiency, Essential hypertension, Nocturia more than twice per night, Insomnia due to medical condition, and Restless legs syndrome were also pertinent to this visit.  HPI Cassidy Ortiz presents for MANAGEMENT OF multiple issues  RESTLESS LEGS.  HAS BEEN TAKING  3 mg requip , dose was increased in January .  Taking full dose at bedtime.    Wakes up several hours later by feeling in feet.  Denies neuropathy  Unable to tolerate CPAP.  Multiple mask trials  Not sleeping well . meds prescribed by Dr. Nicolasa Ducking her psychiatrist , for depression and insomnia .  Having nocturia x 3.   LABILE HYPERTENSION MANAGED WITH PRN NTG .     Doxazosin dose is 2 mg daily  Lab Results  Component Value Date   WBC 5.8 01/21/2018   HGB 11.7 09/19/2017   HCT 36.7 01/21/2018   MCV 91.5 01/21/2018   PLT 135 (L) 01/21/2018   ,[facility] Lab Results  Component Value Date   IRON 58 02/10/2018   TIBC 269 02/10/2018   FERRITIN 53 04/08/2016      Lab Results  Component Value Date   CREATININE 1.96 (H) 02/10/2018      Outpatient Medications Prior to Visit  Medication Sig Dispense Refill  . aspirin 81 MG tablet Take 162 mg by mouth daily at 12 noon. Take at noon.    Marland Kitchen atorvastatin (LIPITOR) 40 MG tablet Take 1 tablet (40 mg total) by mouth daily. 90 tablet 1  . buPROPion (WELLBUTRIN SR) 100 MG 12 hr tablet Take 100 mg by mouth 2 (two) times daily.    . carvedilol (COREG) 12.5 MG tablet TAKE 1 TABLET TWICE A DAY  WITH MEALS. 180 tablet 3  . cloNIDine (CATAPRES) 0.1 MG tablet TAKE 1 TABLET 3 TIMES A DAY 270 tablet 0  . doxazosin (CARDURA) 4 MG tablet Take 4 mg by mouth every evening.     Marland Kitchen esomeprazole (NEXIUM) 40 MG capsule Take 40 mg by mouth daily at 12 noon.    . hydrALAZINE (APRESOLINE) 100 MG tablet TAKE 1 TABLET 3 TIMES A  DAY 270 tablet 3  . hydrOXYzine (VISTARIL) 25 MG capsule 25 mg daily as needed.  1  . letrozole (FEMARA) 2.5 MG tablet TAKE 1 TABLET DAILY 90 tablet 3  . Melatonin 5 MG TABS Take by mouth.    . mirtazapine (REMERON) 45 MG tablet Take 45 mg by mouth at bedtime.    . Multiple Vitamin (MULTIVITAMIN) tablet Take 1 tablet by mouth daily.      . nitroGLYCERIN (NITROSTAT) 0.4 MG SL tablet Place 1 tablet (0.4 mg total) under the tongue every 5 (five) minutes as needed for chest pain. 25 tablet 3  . SYNTHROID 175 MCG tablet TAKE 1 TABLET DAILY        (THYROID STIMULATING       HORMONE NEEDED PRIOR TO    ANY MORE REFILLS) 90 tablet 1  . temazepam (RESTORIL) 15 MG capsule Take 15 mg by mouth at bedtime.   0  . tolterodine (DETROL LA) 4 MG 24 hr capsule Take 1 capsule (4 mg total) by mouth daily. 30 capsule 11  . lisinopril (PRINIVIL,ZESTRIL) 10 MG tablet Take 10 mg by mouth daily.    Marland Kitchen rOPINIRole (REQUIP) 2 MG tablet Take 1 tablet (2 mg total) by  mouth at bedtime. TAKE 1 TABLET (1 MG TOTAL) BY MOUTH DAILY AS NEEDED. 90 tablet 1   Facility-Administered Medications Prior to Visit  Medication Dose Route Frequency Provider Last Rate Last Dose  . 0.9 %  sodium chloride infusion  500 mL Intravenous Once Milus Banister, MD        Review of Systems;  Patient denies headache, fevers, malaise, unintentional weight loss, skin rash, eye pain, sinus congestion and sinus pain, sore throat, dysphagia,  hemoptysis , cough, dyspnea, wheezing, chest pain, palpitations, orthopnea, edema, abdominal pain, nausea, melena, diarrhea, constipation, flank pain, dysuria, hematuria, urinary  Frequency, nocturia, numbness, tingling, seizures,  Focal weakness, Loss of consciousness,  Tremor,, and suicidal ideation.      Objective:  BP 140/70 (BP Location: Left Arm, Patient Position: Sitting, Cuff Size: Large)   Pulse 63   Temp 98.5 F (36.9 C) (Oral)   Resp 15   Ht 5\' 7"  (1.702 m)   Wt 215 lb (97.5 kg)   SpO2 93%   BMI  33.67 kg/m   BP Readings from Last 3 Encounters:  02/10/18 140/70  01/21/18 (!) 180/66  12/24/17 138/76    Wt Readings from Last 3 Encounters:  02/10/18 215 lb (97.5 kg)  01/21/18 213 lb (96.6 kg)  12/24/17 211 lb 12.8 oz (96.1 kg)    General appearance: alert, cooperative and appears stated age Ears: normal TM's and external ear canals both ears Throat: lips, mucosa, and tongue normal; teeth and gums normal Neck: no adenopathy, no carotid bruit, supple, symmetrical, trachea midline and thyroid not enlarged, symmetric, no tenderness/mass/nodules Back: symmetric, no curvature. ROM normal. No CVA tenderness. Lungs: clear to auscultation bilaterally Heart: regular rate and rhythm, S1, S2 normal, no murmur, click, rub or gallop Abdomen: soft, non-tender; bowel sounds normal; no masses,  no organomegaly Pulses: 2+ and symmetric Skin: Skin color, texture, turgor normal. No rashes or lesions Lymph nodes: Cervical, supraclavicular, and axillary nodes normal.  Lab Results  Component Value Date   HGBA1C 5.6 11/08/2016    Lab Results  Component Value Date   CREATININE 1.96 (H) 02/10/2018   CREATININE 1.86 (H) 01/21/2018   CREATININE 1.8 (H) 09/19/2017    Lab Results  Component Value Date   WBC 5.8 01/21/2018   HGB 11.7 09/19/2017   HCT 36.7 01/21/2018   PLT 135 (L) 01/21/2018   GLUCOSE 88 02/10/2018   CHOL 142 07/25/2017   TRIG 129.0 07/25/2017   HDL 58.40 07/25/2017   LDLCALC 58 07/25/2017   ALT 15 01/21/2018   AST 17 01/21/2018   NA 143 02/10/2018   K 4.4 02/10/2018   CL 110 02/10/2018   CREATININE 1.96 (H) 02/10/2018   BUN 46 (H) 02/10/2018   CO2 22 02/10/2018   TSH 0.69 07/25/2017   INR 1.03 05/22/2016   HGBA1C 5.6 11/08/2016    No results found.  Assessment & Plan:   Problem List Items Addressed This Visit    Hypertension (Chronic)    Increasing lisinopril to 20 mg daily       Relevant Medications   lisinopril (PRINIVIL,ZESTRIL) 20 MG tablet    furosemide (LASIX) 20 MG tablet   Insomnia    Advised to take her medications earlier in the evening to help her relax enough to tolerate the CPAP mask. Encouraged to contact the agency supplying CPAP for additional options to mask      Nocturia more than twice per night    Likely aggravated by OSA .  Trial of  furosemide       Restless legs syndrome    Checking iron stores again given her persistent symptoms at 3 mg daily requip dose.   increasing dose to 4 mg   Lab Results  Component Value Date   IRON 58 02/10/2018   TIBC 269 02/10/2018   FERRITIN 53 04/08/2016         Other Visit Diagnoses    Edema, unspecified type    -  Primary   Relevant Orders   Basic Metabolic Panel (BMET) (Completed)   Iron deficiency       Relevant Orders   Iron, TIBC and Ferritin Panel      I have discontinued Wana Murdaugh's rOPINIRole. I have also changed her lisinopril. Additionally, I am having her start on furosemide. Lastly, I am having her maintain her aspirin, multivitamin, buPROPion, doxazosin, Melatonin, temazepam, mirtazapine, carvedilol, tolterodine, atorvastatin, hydrALAZINE, hydrOXYzine, SYNTHROID, letrozole, esomeprazole, cloNIDine, and nitroGLYCERIN. We will continue to administer sodium chloride.  Meds ordered this encounter  Medications  . DISCONTD: rOPINIRole (REQUIP) 4 MG tablet    Sig: Take 1 tablet (4 mg total) by mouth at bedtime. TAKE 1 TABLET (1 MG TOTAL) BY MOUTH DAILY AS NEEDED.    Dispense:  30 tablet    Refill:  3  . lisinopril (PRINIVIL,ZESTRIL) 20 MG tablet    Sig: Take 1 tablet (20 mg total) by mouth daily.    Dispense:  90 tablet    Refill:  2    Keep on file for future refills  . furosemide (LASIX) 20 MG tablet    Sig: One tablet every other day in the morning    Dispense:  30 tablet    Refill:  3    Medications Discontinued During This Encounter  Medication Reason  . rOPINIRole (REQUIP) 2 MG tablet   . lisinopril (PRINIVIL,ZESTRIL) 10 MG tablet      Follow-up: Return in about 4 weeks (around 03/10/2018).   Crecencio Mc, MD

## 2018-02-10 NOTE — Patient Instructions (Addendum)
Increase the lisinopril  20 mg daily  for your blood pressure   I have increased your dose  of Requip to 4 mg daily   For the restless legs    Take the temazepam,   melatonin and the the mirtazapine 1 hour before bedtime.  This is so that you are good and relaxed by the time you place the CPAP   I am prescribing furosemide,  A fluid pill to take every other day in the morning.   The alternative is 1/2 tablet every day

## 2018-02-11 ENCOUNTER — Other Ambulatory Visit: Payer: Self-pay | Admitting: Internal Medicine

## 2018-02-11 ENCOUNTER — Telehealth: Payer: Self-pay | Admitting: Internal Medicine

## 2018-02-11 DIAGNOSIS — R351 Nocturia: Secondary | ICD-10-CM | POA: Insufficient documentation

## 2018-02-11 DIAGNOSIS — N182 Chronic kidney disease, stage 2 (mild): Secondary | ICD-10-CM | POA: Insufficient documentation

## 2018-02-11 MED ORDER — ROPINIROLE HCL 4 MG PO TABS: 4.0000 mg | ORAL_TABLET | Freq: Every day | ORAL | 3 refills | Status: DC

## 2018-02-11 NOTE — Assessment & Plan Note (Signed)
Likely aggravated by OSA .  Trial of furosemide

## 2018-02-11 NOTE — Telephone Encounter (Signed)
Pt came into office, I spoke to Dr. Derrel Nip about this and [facility].

## 2018-02-11 NOTE — Assessment & Plan Note (Signed)
Checking iron stores again given her persistent symptoms at 3 mg daily requip dose.   increasing dose to 4 mg   Lab Results  Component Value Date   IRON 58 02/10/2018   TIBC 269 02/10/2018   FERRITIN 53 04/08/2016

## 2018-02-11 NOTE — Telephone Encounter (Signed)
Copied from [place] [phone]. Topic: Quick Communication - See Telephone Encounter >> Feb 11, 2018  4:07 PM Burnis Medin, NT wrote: CRM for notification. See Telephone encounter for: Patient called and said the doctor was suppose to call in rOPINIRole (REQUIP) 4 MG tablet but pharmacy said they don't have a request for that medication. Pt uses  [id number] [address], [place] [phone] (Phone) [phone] (Fax)   02/11/18.

## 2018-02-11 NOTE — Assessment & Plan Note (Addendum)
Cr  Jumped in 2017 but has been relatively stable.  Will recheck after starting furosemide . nephrology referral advised.

## 2018-02-11 NOTE — Assessment & Plan Note (Signed)
Advised to take her medications earlier in the evening to help her relax enough to tolerate the CPAP mask. Encouraged to contact the agency supplying CPAP for additional options to mask

## 2018-02-11 NOTE — Assessment & Plan Note (Signed)
Increasing lisinopril to 20mg daily

## 2018-02-11 NOTE — Telephone Encounter (Signed)
fyi

## 2018-02-12 ENCOUNTER — Encounter: Payer: Self-pay | Admitting: Internal Medicine

## 2018-02-12 LAB — BASIC METABOLIC PANEL
BUN / CREAT RATIO: 23 (calc) — AB (ref 6–22)
BUN: 46 mg/dL — ABNORMAL HIGH (ref 7–25)
CALCIUM: 9.2 mg/dL (ref 8.6–10.4)
CHLORIDE: 110 mmol/L (ref 98–110)
CO2: 22 mmol/L (ref 20–32)
Creat: 1.96 mg/dL — ABNORMAL HIGH (ref 0.60–0.93)
Glucose, Bld: 88 mg/dL (ref 65–99)
POTASSIUM: 4.4 mmol/L (ref 3.5–5.3)
SODIUM: 143 mmol/L (ref 135–146)

## 2018-02-12 LAB — IRON,TIBC AND FERRITIN PANEL
%SAT: 22 % (ref 11–50)
Ferritin: 22 ng/mL (ref 20–288)
Iron: 58 ug/dL (ref 45–160)
TIBC: 269 mcg/dL (calc) (ref 250–450)

## 2018-02-26 ENCOUNTER — Other Ambulatory Visit: Payer: Self-pay | Admitting: Cardiovascular Disease

## 2018-02-26 ENCOUNTER — Telehealth: Payer: Self-pay | Admitting: Internal Medicine

## 2018-02-26 NOTE — Telephone Encounter (Signed)
Copied from [place] [phone]. Topic: Quick Communication - Rx Refill/Question >> Feb 26, 2018  3:59 PM Margot Ables wrote: Pt husband called stating pt has been taking furosemide and ropinirole. She is taking each 1x per day but still has leg swelling and restless leg. He is wanting to increase her dose. He also asked that it be sent for 3 month supply as he fills med boxes for pt and this would make it easier to do in advance. Please advise.

## 2018-02-26 NOTE — Telephone Encounter (Signed)
Please advise 

## 2018-03-02 ENCOUNTER — Ambulatory Visit (INDEPENDENT_AMBULATORY_CARE_PROVIDER_SITE_OTHER): Payer: Medicare Other | Admitting: Internal Medicine

## 2018-03-02 ENCOUNTER — Ambulatory Visit: Payer: Self-pay

## 2018-03-02 ENCOUNTER — Encounter: Payer: Self-pay | Admitting: Internal Medicine

## 2018-03-02 VITALS — BP 144/86 | HR 68 | Temp 98.2°F | Resp 15 | Ht 67.0 in | Wt 218.6 lb

## 2018-03-02 DIAGNOSIS — Z79899 Other long term (current) drug therapy: Secondary | ICD-10-CM

## 2018-03-02 DIAGNOSIS — E669 Obesity, unspecified: Secondary | ICD-10-CM | POA: Diagnosis not present

## 2018-03-02 DIAGNOSIS — I10 Essential (primary) hypertension: Secondary | ICD-10-CM

## 2018-03-02 DIAGNOSIS — R05 Cough: Secondary | ICD-10-CM | POA: Diagnosis not present

## 2018-03-02 DIAGNOSIS — R609 Edema, unspecified: Secondary | ICD-10-CM

## 2018-03-02 DIAGNOSIS — D509 Iron deficiency anemia, unspecified: Secondary | ICD-10-CM | POA: Diagnosis not present

## 2018-03-02 DIAGNOSIS — R059 Cough, unspecified: Secondary | ICD-10-CM

## 2018-03-02 DIAGNOSIS — N182 Chronic kidney disease, stage 2 (mild): Secondary | ICD-10-CM | POA: Diagnosis not present

## 2018-03-02 DIAGNOSIS — Z8719 Personal history of other diseases of the digestive system: Secondary | ICD-10-CM | POA: Diagnosis not present

## 2018-03-02 DIAGNOSIS — G4733 Obstructive sleep apnea (adult) (pediatric): Secondary | ICD-10-CM | POA: Diagnosis not present

## 2018-03-02 LAB — BASIC METABOLIC PANEL
BUN: 37 mg/dL — ABNORMAL HIGH (ref 6–23)
CO2: 20 mEq/L (ref 19–32)
CREATININE: 1.75 mg/dL — AB (ref 0.40–1.20)
Calcium: 9.2 mg/dL (ref 8.4–10.5)
Chloride: 111 mEq/L (ref 96–112)
GFR: 30.11 mL/min — AB (ref >60.00)
GLUCOSE: 107 mg/dL — AB (ref 70–99)
POTASSIUM: 3.9 meq/L (ref 3.5–5.1)
Sodium: 143 mEq/L (ref 135–145)

## 2018-03-02 MED ORDER — LISINOPRIL 40 MG PO TABS: 20.0000 mg | ORAL_TABLET | Freq: Every day | ORAL | 1 refills | Status: DC

## 2018-03-02 MED ORDER — LISINOPRIL 20 MG PO TABS: 20.0000 mg | ORAL_TABLET | Freq: Every day | ORAL | 0 refills | Status: DC

## 2018-03-02 NOTE — Telephone Encounter (Signed)
fyi pt is coming in today at 1:30pm

## 2018-03-02 NOTE — Telephone Encounter (Signed)
Pt's husband would like to see about increasing pt's dose of lasix and ropinirole due to still having swelling and restless leg symptoms or do we need to schedule for a follow up appt?

## 2018-03-02 NOTE — Telephone Encounter (Signed)
Pt. Reports she has had this in the past and "it was due to my blood pressure." Denies any shortness of breath or chest pain. Denies any redness to lower extremities. States she has pain when trying to ambulate. Appointment made for today by agent.  Answer Assessment - Initial Assessment Questions 1. ONSET: "When did the swelling start?" (e.g., minutes, hours, days)     Started last week 2. LOCATION: "What part of the leg is swollen?"  "Are both legs swollen or just one leg?"     Both legs - feet anklesMild 3. SEVERITY: "How bad is the swelling?" (e.g., localized; mild, moderate, severe)  - Localized - small area of swelling localized to one leg  - MILD pedal edema - swelling limited to foot and ankle, pitting edema < 1/4 inch (6 mm) deep, rest and elevation eliminate most or all swelling  - MODERATE edema - swelling of lower leg to knee, pitting edema > 1/4 inch (6 mm) deep, rest and elevation only partially reduce swelling  - SEVERE edema - swelling extends above knee, facial or hand swelling present      Mild 4. REDNESS: "Does the swelling look red or infected?"     No redness 5. PAIN: "Is the swelling painful to touch?" If so, ask: "How painful is it?"   (Scale 1-10; mild, moderate or severe)     5 6. FEVER: "Do you have a fever?" If so, ask: "What is it, how was it measured, and when did it start?"      No 7. CAUSE: "What do you think is causing the leg swelling?"     Blood pressure 8. MEDICAL HISTORY: "Do you have a history of heart failure, kidney disease, liver failure, or cancer?"     Cancer 9. RECURRENT SYMPTOM: "Have you had leg swelling before?" If so, ask: "When was the last time?" "What happened that time?"     Yes 10. OTHER SYMPTOMS: "Do you have any other symptoms?" (e.g., chest pain, difficulty breathing)       Cough 11. PREGNANCY: "Is there any chance you are pregnant?" "When was your last menstrual period?"       No  Protocols used: LEG SWELLING AND EDEMA-A-AH

## 2018-03-02 NOTE — Progress Notes (Signed)
Subjective:  Patient ID: Cassidy Ortiz, female    DOB: 1941-03-13  Age: 77 y.o. MRN: [id number]  CC: The primary encounter diagnosis was Long-term use of high-risk medication. Diagnoses of Edema, unspecified type, Cough, Chronic kidney disease (CKD) stage G2/A3, mildly decreased glomerular filtration rate (GFR) between 60-89 mL/min/1.73 square meter and albuminuria creatinine ratio greater than 300 mg/g, Obesity (BMI 30-39.9), OSA (obstructive sleep apnea), Iron deficiency anemia, unspecified iron deficiency anemia type, Essential hypertension, and History of Barrett's esophagus were also pertinent to this visit.  HPI Cassidy Ortiz presents for follow up on multiple issues including  persistent edema and restless legs symptoms .    She has been taking furosemide daily   20 mg and wearing compression knee highs that her husband purchased online.  Despite these measures she reports increased edema during  A recent prlonged car ride to  [facility], during which she identified a retirement community nearer to her children that she plans to relocate to after selling her home.     Untreated OSA:  Repeat titration study done in January with nasal pillows CPAP model rather than full face mask.  She has been using the CPAP for " a couple of hours per night."  She does not intentionally take it off.  She is using the nasal pillows model and tolerating it thus far. .  Hypertension:  Her dose of lisinopril was increased 2 weeks ago to 20 mg daily.  Taking 2 10 mg tablets.   RLS:  Her dose of requip was increased to 4 mg at last visit and she believes it is helping manage her symptoms so she can rest better.    She was referred to nephrology for CKD.  She cannot remember when her last visit was,  But remembers her nephrologist is Dr. Rolly Salter  And he made no changes to her regimen at her last visit     New issues:  She reports a 3 week history of cough.  She has a productive cough in  the morning, wakes up with it,   sputum is and difficult to clear from throat  Also notes it at bedtime but not during the day.   Request for diet: patient and husband requested help in losing weight ,  Requesting a  diet    Outpatient Medications Prior to Visit  Medication Sig Dispense Refill  . aspirin 81 MG tablet Take 162 mg by mouth daily at 12 noon. Take at noon.    Marland Kitchen atorvastatin (LIPITOR) 40 MG tablet Take 1 tablet (40 mg total) by mouth daily. 90 tablet 1  . buPROPion (WELLBUTRIN SR) 100 MG 12 hr tablet Take 100 mg by mouth 2 (two) times daily.    . carvedilol (COREG) 12.5 MG tablet TAKE 1 TABLET TWICE A DAY  WITH MEALS. 180 tablet 3  . cloNIDine (CATAPRES) 0.1 MG tablet TAKE 1 TABLET 3 TIMES A DAY 270 tablet 3  . doxazosin (CARDURA) 4 MG tablet Take 4 mg by mouth every evening.     . furosemide (LASIX) 20 MG tablet One tablet every other day in the morning 30 tablet 3  . hydrALAZINE (APRESOLINE) 100 MG tablet TAKE 1 TABLET 3 TIMES A DAY 270 tablet 3  . hydrOXYzine (VISTARIL) 25 MG capsule 25 mg daily as needed.  1  . letrozole (FEMARA) 2.5 MG tablet TAKE 1 TABLET DAILY 90 tablet 3  . Melatonin 5 MG TABS Take by mouth.    . mirtazapine (REMERON) 45 MG tablet  Take 45 mg by mouth at bedtime.    . Multiple Vitamin (MULTIVITAMIN) tablet Take 1 tablet by mouth daily.      . nitroGLYCERIN (NITROSTAT) 0.4 MG SL tablet Place 1 tablet (0.4 mg total) under the tongue every 5 (five) minutes as needed for chest pain. 25 tablet 3  . rOPINIRole (REQUIP) 4 MG tablet Take 1 tablet (4 mg total) by mouth at bedtime. TAKE 1 TABLET (1 MG TOTAL) BY MOUTH DAILY AS NEEDED. 30 tablet 3  . SYNTHROID 175 MCG tablet TAKE 1 TABLET DAILY        (THYROID STIMULATING       HORMONE NEEDED PRIOR TO    ANY MORE REFILLS) 90 tablet 1  . temazepam (RESTORIL) 15 MG capsule Take 15 mg by mouth at bedtime.   0  . tolterodine (DETROL LA) 4 MG 24 hr capsule Take 1 capsule (4 mg total) by mouth daily. 30 capsule 11  . esomeprazole (NEXIUM) 40 MG capsule Take  40 mg by mouth daily at 12 noon.    Marland Kitchen lisinopril (PRINIVIL,ZESTRIL) 20 MG tablet Take 1 tablet (20 mg total) by mouth daily. 90 tablet 2   Facility-Administered Medications Prior to Visit  Medication Dose Route Frequency Provider Last Rate Last Dose  . 0.9 %  sodium chloride infusion  500 mL Intravenous Once Milus Banister, MD        Review of Systems;  Patient denies headache, fevers, malaise, unintentional weight loss, skin rash, eye pain, sinus congestion and sinus pain, sore throat, dysphagia,  hemoptysis , cough, dyspnea, wheezing, chest pain, palpitations, orthopnea, edema, abdominal pain, nausea, melena, diarrhea, constipation, flank pain, dysuria, hematuria, urinary  Frequency, nocturia, numbness, tingling, seizures,  Focal weakness, Loss of consciousness,  Tremor, insomnia, depression, anxiety, and suicidal ideation.      Objective:  BP (!) 144/86 (BP Location: Left Arm, Patient Position: Sitting, Cuff Size: Large)   Pulse 68   Temp 98.2 F (36.8 C) (Oral)   Resp 15   Ht 5\' 7"  (1.702 m)   Wt 218 lb 9.6 oz (99.2 kg)   SpO2 92%   BMI 34.24 kg/m   BP Readings from Last 3 Encounters:  03/02/18 (!) 144/86  02/10/18 140/70  01/21/18 (!) 180/66    Wt Readings from Last 3 Encounters:  03/02/18 218 lb 9.6 oz (99.2 kg)  02/10/18 215 lb (97.5 kg)  01/21/18 213 lb (96.6 kg)    General appearance: alert, cooperative and appears stated age Ears: normal TM's and external ear canals both ears Throat: lips, mucosa, and tongue normal; teeth and gums normal Neck: no adenopathy, no carotid bruit, supple, symmetrical, trachea midline and thyroid not enlarged, symmetric, no tenderness/mass/nodules Back: symmetric, no curvature. ROM normal. No CVA tenderness. Lungs: clear to auscultation bilaterally Heart: regular rate and rhythm, S1, S2 normal, no murmur, click, rub or gallop Abdomen: soft, non-tender; bowel sounds normal; no masses,  no organomegaly Pulses: 2+ and symmetric Skin:  Skin color, texture, turgor normal. No rashes or lesions Lymph nodes: Cervical, supraclavicular, and axillary nodes normal.  Lab Results  Component Value Date   HGBA1C 5.6 11/08/2016    Lab Results  Component Value Date   CREATININE 1.75 (H) 03/02/2018   CREATININE 1.96 (H) 02/10/2018   CREATININE 1.86 (H) 01/21/2018    Lab Results  Component Value Date   WBC 5.8 01/21/2018   HGB 11.7 09/19/2017   HCT 36.7 01/21/2018   PLT 135 (L) 01/21/2018   GLUCOSE 107 (H) 03/02/2018  CHOL 142 07/25/2017   TRIG 129.0 07/25/2017   HDL 58.40 07/25/2017   LDLCALC 58 07/25/2017   ALT 15 01/21/2018   AST 17 01/21/2018   NA 143 03/02/2018   K 3.9 03/02/2018   CL 111 03/02/2018   CREATININE 1.75 (H) 03/02/2018   BUN 37 (H) 03/02/2018   CO2 20 03/02/2018   TSH 0.69 07/25/2017   INR 1.03 05/22/2016   HGBA1C 5.6 11/08/2016    No results found.  Assessment & Plan:   Problem List Items Addressed This Visit    OSA (obstructive sleep apnea)    Diagnosed by sleep study. She is wearing her CPAP every night a minimum of 6 hours per night and notes improved daytime wakefulness and decreased fatigue       Obesity (BMI 30-39.9)    Low GI diet handout given.       Hypertension (Chronic)    Improved with lisinopril 20 mg daily but not at goal. Repeat potasssium and cr are stable.  Increase dose to 40 mg daily   Lab Results  Component Value Date   CREATININE 1.75 (H) 03/02/2018   Lab Results  Component Value Date   NA 143 03/02/2018   K 3.9 03/02/2018   CL 111 03/02/2018   CO2 20 03/02/2018   No results found for: MICROALBUR, [id number]       Relevant Medications   lisinopril (PRINIVIL,ZESTRIL) 40 MG tablet   lisinopril (PRINIVIL,ZESTRIL) 20 MG tablet   History of Barrett's esophagus    By 2355 EGD.  Recommended to continue daily PPI      Cough    Etiology unclear .  Supportive care with PPI, antihistamine and saline irrigation.  If no improvement in 2 weeks,  Chest x ray  followed by dc lisinopril.       Relevant Orders   DG Chest 2 View   Chronic kidney disease (CKD) stage G2/A3, mildly decreased glomerular filtration rate (GFR) between 60-89 mL/min/1.73 square meter and albuminuria creatinine ratio greater than 300 mg/g    Cr  Jumped in 2017 but has been relatively stable.  She has apparently been seeing [facility] but no records in chart .  Cr is stable despite  starting a daily dose of furosemide . nephrology referral made  Lab Results  Component Value Date   CREATININE 1.75 (H) 03/02/2018   Lab Results  Component Value Date   NA 143 03/02/2018   K 3.9 03/02/2018   CL 111 03/02/2018   CO2 20 03/02/2018         Anemia    Resolved by last check,  Iron stores are normal   Lab Results  Component Value Date   WBC 5.8 01/21/2018   HGB 11.7 09/19/2017   HCT 36.7 01/21/2018   MCV 91.5 01/21/2018   PLT 135 (L) 01/21/2018   Lab Results  Component Value Date   IRON 58 02/10/2018   TIBC 269 02/10/2018   FERRITIN 22 02/10/2018          Other Visit Diagnoses    Long-term use of high-risk medication    -  Primary   Relevant Orders   Basic metabolic panel (Completed)   Edema, unspecified type       Relevant Orders   DME Other see comment     A total of 40 minutes was spent with patient more than half of which was spent in counseling patient on the above mentioned issues , reviewing and explaining recent labs and imaging  studies done, and coordination of care.   I have changed Bentley Ontiveros's lisinopril and esomeprazole. I am also having her start on lisinopril. Additionally, I am having her maintain her aspirin, multivitamin, buPROPion, doxazosin, Melatonin, temazepam, mirtazapine, tolterodine, atorvastatin, hydrALAZINE, hydrOXYzine, SYNTHROID, letrozole, nitroGLYCERIN, furosemide, rOPINIRole, carvedilol, and cloNIDine. We will continue to administer sodium chloride.  Meds ordered this encounter  Medications  . lisinopril (PRINIVIL,ZESTRIL)  40 MG tablet    Sig: Take 0.5 tablets (20 mg total) by mouth daily.    Dispense:  90 tablet    Refill:  1    Keep on file for future refills  . lisinopril (PRINIVIL,ZESTRIL) 20 MG tablet    Sig: Take 1 tablet (20 mg total) by mouth daily.    Dispense:  30 tablet    Refill:  0  . esomeprazole (NEXIUM) 40 MG capsule    Sig: Take 1 capsule (40 mg total) by mouth daily at 12 noon.    Dispense:  30 capsule    Refill:  11    Keep on file for future refills    Medications Discontinued During This Encounter  Medication Reason  . lisinopril (PRINIVIL,ZESTRIL) 20 MG tablet   . esomeprazole (NEXIUM) 40 MG capsule Reorder    Follow-up: No follow-ups on file.   Crecencio Mc, MD

## 2018-03-02 NOTE — Patient Instructions (Addendum)
For your cough:  You should try NeilMed's Sinus rinse ;  It is a stong sinus "flush" using water and medicated salts.  Do it over the sink because it can be a bit messy  You can also take mucinex once or twice daily to help thin out the mucus   If the cough does not improve in 2 weeks, come in for a chest x ray    For your blood pressure: I am increasing the dose of lisinopril to 40 mg daily for your blood pressure  For your leg swelling:  [employer] has a great selection of compression stockingss and they will measure you for it     This is  Dr. Lupita Dawn  example of a  "Low GI"  Diet:  It will allow you to lose 4 to 8  lbs  per month if you follow it carefully.  Your goal with exercise is a minimum of 30 minutes of aerobic exercise 5 days per week (Walking does not count once it becomes easy!)    All of the foods can be found at grocery stores and in bulk at [employer].  The Atkins protein bars and shakes are available in more varieties at [employer], [employer] and [place].     7 AM Breakfast:  Choose from the following:  Low carbohydrate Protein  Shakes (I recommend the  Premier Protein chocolate shakes,  EAS AdvantEdge "Carb Control" shakes  Or the Atkins shakes all are under 3 net carbs)     a scrambled egg/bacon/cheese burrito made with Mission's "carb balance" whole wheat tortilla  (about 10 net carbs )  Regulatory affairs officer (basically a quiche without the pastry crust) that is eaten cold and very convenient way to get your eggs.  8 carbs)  If you make your own protein shakes, avoid bananas and pineapple,  And use low carb greek yogurt or original /unsweetened almond or soy milk    Avoid cereal and bananas, oatmeal and cream of wheat and grits. They are loaded with carbohydrates!   10 AM: high protein snack:  Protein bar by Atkins (the snack size, under 200 cal, usually < 6 net carbs).    A stick of cheese:  Around 1 carb,  100 cal     Dannon Light n  Fit [place] Yogurt  (80 cal, 8 carbs)  Other so called "protein bars" and Greek yogurts tend to be loaded with carbohydrates.  Remember, in food advertising, the word "energy" is synonymous for " carbohydrate."  Lunch:   A Sandwich using the bread choices listed, Can use any  Eggs,  lunchmeat, grilled meat or canned tuna), avocado, regular mayo/mustard  and cheese.  A Salad using blue cheese, ranch,  Goddess or vinagrette,  Avoid taco shells, croutons or "confetti" and no "candied nuts" but regular nuts OK.   No pretzels, nabs  or chips.  Pickles and miniature sweet peppers are a good low carb alternative that provide a "crunch"  The bread is the only source of carbohydrate in a sandwich and  can be decreased by trying some of the attached alternatives to traditional loaf bread   Avoid "Low fat dressings, as well as [place] dressings They are loaded with sugar!   3 PM/ Mid day  Snack:  Consider  1 ounce of  almonds, walnuts, pistachios, pecans, peanuts,  Macadamia nuts or a nut medley.  Avoid "granola and granola bars "  Mixed nuts are  ok in moderation as long as there are no raisins,  cranberries or dried fruit.   KIND bars are OK if you get the low glycemic index variety   Try the prosciutto/mozzarella cheese sticks by [employer]  In deli /backery section   High protein      6 PM  Dinner:     Meat/fowl/fish with a green salad, and either broccoli, cauliflower, green beans, spinach, brussel sprouts or  Lima beans. DO NOT BREAD THE PROTEIN!!      There is a low carb pasta by Dreamfield's that is acceptable and tastes great: only 5 digestible carbs/serving.( All grocery stores but BJs carry it ) Several ready made meals are available low carb:   Try Michel Angelo's chicken piccata or chicken or eggplant parm over low carb pasta.(Lowes and BJs)   Marjory Lies Sanchez's "Carnitas" (pulled pork, no sauce,  0 carbs) or his beef pot roast to make a dinner burrito (at [employer]J's)  Pesto over  low carb pasta (bj's sells a good quality pesto in the center refrigerated section of the deli   Try satueeing  Cheral Marker with mushroooms as a good side   [employer] Giant makes a mashed cauliflower that tastes like mashed potatoes  Whole wheat pasta is still full of digestible carbs and  Not as low in glycemic index as Dreamfield's.   Brown rice is still rice,  So skip the rice and noodles if you eat [place] or [place] (or at least limit to 1/2 cup)  9 PM snack :   Breyer's "low carb" fudgsicle or  ice cream bar (Carb Smart line), or  Weight Watcher's ice cream bar , or another "no sugar added" ice cream;  a serving of fresh berries/cherries with whipped cream   Cheese or DANNON'S LlGHT N FIT GREEK YOGURT  8 ounces of [place] unsweetened almond/cococunut milk    Treat yourself to a parfait made with whipped cream blueberiies, walnuts and vanilla greek yogurt  Avoid bananas, pineapple, grapes  and watermelon on a regular basis because they are high in sugar.  THINK OF THEM AS DESSERT  Remember that snack Substitutions should be less than 10 NET carbs per serving and meals < 20 carbs. Remember to subtract fiber grams to get the "net carbs."

## 2018-03-02 NOTE — Telephone Encounter (Signed)
Disregard pt is scheduled to come in today at 1:30pm.

## 2018-03-02 NOTE — Telephone Encounter (Signed)
fyi

## 2018-03-03 DIAGNOSIS — R05 Cough: Secondary | ICD-10-CM | POA: Insufficient documentation

## 2018-03-03 DIAGNOSIS — R059 Cough, unspecified: Secondary | ICD-10-CM | POA: Insufficient documentation

## 2018-03-03 MED ORDER — ESOMEPRAZOLE MAGNESIUM 40 MG PO CPDR: 40.0000 mg | DELAYED_RELEASE_CAPSULE | Freq: Every day | ORAL | 11 refills | Status: DC

## 2018-03-03 NOTE — Assessment & Plan Note (Signed)
Diagnosed by sleep study. She is wearing her CPAP every night a minimum of 6 hours per night and notes improved daytime wakefulness and decreased fatigue  

## 2018-03-03 NOTE — Assessment & Plan Note (Signed)
Resolved by last check,  Iron stores are normal   Lab Results  Component Value Date   WBC 5.8 01/21/2018   HGB 11.7 09/19/2017   HCT 36.7 01/21/2018   MCV 91.5 01/21/2018   PLT 135 (L) 01/21/2018   Lab Results  Component Value Date   IRON 58 02/10/2018   TIBC 269 02/10/2018   FERRITIN 22 02/10/2018

## 2018-03-03 NOTE — Assessment & Plan Note (Signed)
By 8828 EGD.  Recommended to continue daily PPI

## 2018-03-03 NOTE — Assessment & Plan Note (Signed)
Etiology unclear .  Supportive care with PPI, antihistamine and saline irrigation.  If no improvement in 2 weeks,  Chest x ray followed by dc lisinopril.

## 2018-03-03 NOTE — Assessment & Plan Note (Signed)
Low GI diet handout given.

## 2018-03-03 NOTE — Assessment & Plan Note (Signed)
Improved with lisinopril 20 mg daily but not at goal. Repeat potasssium and cr are stable.  Increase dose to 40 mg daily   Lab Results  Component Value Date   CREATININE 1.75 (H) 03/02/2018   Lab Results  Component Value Date   NA 143 03/02/2018   K 3.9 03/02/2018   CL 111 03/02/2018   CO2 20 03/02/2018   No results found for: Derl Barrow

## 2018-03-03 NOTE — Assessment & Plan Note (Addendum)
Cr  Jumped in 2017 but has been relatively stable.  She has apparently been seeing [facility] but no records in chart .  Cr is stable despite  starting a daily dose of furosemide . nephrology referral made  Lab Results  Component Value Date   CREATININE 1.75 (H) 03/02/2018   Lab Results  Component Value Date   NA 143 03/02/2018   K 3.9 03/02/2018   CL 111 03/02/2018   CO2 20 03/02/2018

## 2018-03-05 ENCOUNTER — Inpatient Hospital Stay: Payer: Medicare Other | Attending: Radiation Oncology

## 2018-03-05 DIAGNOSIS — Z95828 Presence of other vascular implants and grafts: Secondary | ICD-10-CM

## 2018-03-05 DIAGNOSIS — C50912 Malignant neoplasm of unspecified site of left female breast: Secondary | ICD-10-CM | POA: Diagnosis not present

## 2018-03-05 MED ORDER — SODIUM CHLORIDE 0.9% FLUSH
10.0000 mL | INTRAVENOUS | Status: AC | PRN
  Administered 2018-03-05: 10 mL
  Filled 2018-03-05: qty 10

## 2018-03-05 MED ORDER — HEPARIN SOD (PORK) LOCK FLUSH 100 UNIT/ML IV SOLN
500.0000 [IU] | INTRAVENOUS | Status: AC | PRN
  Administered 2018-03-05: 500 [IU]

## 2018-03-09 ENCOUNTER — Ambulatory Visit (INDEPENDENT_AMBULATORY_CARE_PROVIDER_SITE_OTHER): Payer: Medicare Other | Admitting: Urology

## 2018-03-09 ENCOUNTER — Encounter: Payer: Self-pay | Admitting: Urology

## 2018-03-09 VITALS — BP 165/80 | HR 61 | Resp 16 | Ht 67.0 in | Wt 215.5 lb

## 2018-03-09 DIAGNOSIS — N3946 Mixed incontinence: Secondary | ICD-10-CM

## 2018-03-09 MED ORDER — TOLTERODINE TARTRATE ER 4 MG PO CP24: 4.0000 mg | ORAL_CAPSULE | Freq: Every day | ORAL | 11 refills | Status: DC

## 2018-03-09 NOTE — Progress Notes (Signed)
03/09/2018 11:16 AM   Cassidy Ortiz Aug 27, 1941 [id number]  Referring provider: Crecencio Mc, MD [name], [place] [address]  No chief complaint on file.   HPI: Dr Tresa Moore (2016): The patient had mixed incontinence and had had urodynamics in 2016. The patient had a partial right nephrectomy for renal cell carcinoma.   Today Patient is continent during the day with reduced frequency. She does not have bedwetting. 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 The Lisbeth Ply works great. It is $90 per month..   I reviewed my  note. She had low pressure bladder overactivity on urodynamics and was triggering. She had mild to moderate stress incontinence. Her primary problem was urgency incontinence with mild stress incontinence. I was going to consider neuromodulation therapy versus sling and I would require to take the stress component. She is having memory issues. I was concerned that her overactive bladder could persist after a sling due to her CNS issues. She had minimal response to trospium. The higher dose Toviaz cause dry mouth. She just went through breast cancer and chemotherapy. I believe she had side effects from Vesicare  The patient was given Detrol LA and I felt if needed we will go back to the [facility]. I filled her primary problem was an overactive bladder  Today Patient is nearly dry with less frequency during the day.  She gets up 3 or 4 times a night.  She is on Lasix and has some intermittent ankle edema.  In my opinion she should not be treated with desmopressin.  Clinically not infected    PMH: Past Medical History:  Diagnosis Date  . Abnormal weight gain   . Acquired cyst of kidney   . Anxiety state, unspecified   . Baker's cyst of knee    Left, pt does not remember  . Breast cancer ([facility]) 2001   RT LUMPECTOMY  . Breast cancer of upper-inner quadrant of left female breast ([facility]) 04/19/2016  . Cerebral aneurysm rupture ([facility]) 2008   stent  . Cerebral aneurysm, nonruptured 2008   s/p coil and shunt, performed in [place]  . Chronic diastolic CHF (congestive heart failure) ([facility])    a. 05/2016 Echo: EF 60-65%, no rwma, mild AI, nl RV fxn, mildly dil LA.  Marland Kitchen Chronic kidney disease, stage III (moderate) (HCC)   . Depressive disorder, not elsewhere classified   . Diabetes mellitus without complication ([facility])   . Diverticulosis of colon (without mention of hemorrhage)   . Duodenal mass   . Duodenal ulcer   . Edema   . Fatty liver   . Grave's disease   . Heart murmur   . HOCM (hypertrophic obstructive cardiomyopathy) ([facility])    a. 06/2012 Echo: EF 65-70%, sev LVH w/ dynamic obstruction, Gr1 DD, mild AI, mildly dil LA;  b. 05/2016 Echo: EF 60-65%, mild LVH, no rwma.  . Hypertension   . Hypertensive kidney disease, benign   . Hypothyroid   . Malignant neoplasm of breast (female), unspecified site 2001   right, s/p lumpectomy and XRT  . Malignant neoplasm of kidney, except pelvis 2008   s/p partial nephrectomy  . Mixed hyperlipidemia   . Obstructive sleep apnea (adult) (pediatric)   . Personal history of colonic polyps 10/22/2002   hyperplastic   . Radiation 2001   BREAST CA  . Sleep apnea    off cpap  . Unspecified hypothyroidism   . Vitamin D deficiency     Surgical History: Past Surgical History:  Procedure Laterality Date  . ANEURYSM COILING     3 times  . BREAST BIOPSY Left 03/14/2016   stereo  . BREAST EXCISIONAL BIOPSY Left 04/2016   + rad and chemo   . BREAST EXCISIONAL BIOPSY Right 2001   rad and lumpectomy +  . BREAST LUMPECTOMY Right    right  . CATARACT EXTRACTION     bilateral  . COLONOSCOPY     last 2012.+ TA polyps  . CORONARY STENT PLACEMENT     brain  . CSF SHUNT  08/17/2008  . FEMORAL ARTERY REPAIR  2011  . HEMIARTHROPLASTY SHOULDER FRACTURE     left  . PARTIAL NEPHRECTOMY     right  . RADIOACTIVE SEED GUIDED PARTIAL MASTECTOMY WITH AXILLARY SENTINEL LYMPH NODE BIOPSY Left 04/19/2016    Procedure: RADIOACTIVE SEED GUIDED PARTIAL MASTECTOMY WITH AXILLARY SENTINEL LYMPH NODE BIOPSY;  Surgeon: Fanny Skates, MD;  Location: [facility];  Service: General;  Laterality: Left;  . SHOULDER SURGERY     left  . UPPER GASTROINTESTINAL ENDOSCOPY  11-10-2014  . VAGINAL DELIVERY     3    Home Medications:  Allergies as of 03/09/2018      Reactions   Amlodipine    Leg swelling      Medication List        Accurate as of 03/09/18 11:16 AM. Always use your most recent med list.          aspirin 81 MG tablet Take 162 mg by mouth daily at 12 noon. Take at noon.   atorvastatin 40 MG tablet Commonly known as:  LIPITOR Take 1 tablet (40 mg total) by mouth daily.   buPROPion 100 MG 12 hr tablet Commonly known as:  WELLBUTRIN SR Take 100 mg by mouth 2 (two) times daily.   carvedilol 12.5 MG tablet Commonly known as:  COREG TAKE 1 TABLET TWICE A DAY  WITH MEALS.   cloNIDine 0.1 MG tablet Commonly known as:  CATAPRES TAKE 1 TABLET 3 TIMES A DAY   doxazosin 4 MG tablet Commonly known as:  CARDURA Take 4 mg by mouth every evening.   esomeprazole 40 MG capsule Commonly known as:  NEXIUM Take 1 capsule (40 mg total) by mouth daily at 12 noon.   furosemide 20 MG tablet Commonly known as:  LASIX One tablet every other day in the morning   hydrALAZINE 100 MG tablet Commonly known as:  APRESOLINE TAKE 1 TABLET 3 TIMES A DAY   hydrOXYzine 25 MG capsule Commonly known as:  VISTARIL 25 mg daily as needed.   letrozole 2.5 MG tablet Commonly known as:  FEMARA TAKE 1 TABLET DAILY   lisinopril 40 MG tablet Commonly known as:  PRINIVIL,ZESTRIL Take 0.5 tablets (20 mg total) by mouth daily.   lisinopril 20 MG tablet Commonly known as:  PRINIVIL,ZESTRIL Take 1 tablet (20 mg total) by mouth daily.   Melatonin 5 MG Tabs Take by mouth.   mirtazapine 45 MG tablet Commonly known as:  REMERON Take 45 mg by mouth at bedtime.   multivitamin tablet Take 1  tablet by mouth daily.   nitroGLYCERIN 0.4 MG SL tablet Commonly known as:  NITROSTAT Place 1 tablet (0.4 mg total) under the tongue every 5 (five) minutes as needed for chest pain.   rOPINIRole 4 MG tablet Commonly known as:  REQUIP Take 1 tablet (4 mg total) by mouth at bedtime. TAKE 1 TABLET (1 MG TOTAL) BY MOUTH DAILY AS NEEDED.  SYNTHROID 175 MCG tablet Generic drug:  levothyroxine TAKE 1 TABLET DAILY        (THYROID STIMULATING       HORMONE NEEDED PRIOR TO    ANY MORE REFILLS)   temazepam 15 MG capsule Commonly known as:  RESTORIL Take 15 mg by mouth at bedtime.   tolterodine 4 MG 24 hr capsule Commonly known as:  DETROL LA Take 1 capsule (4 mg total) by mouth daily.       Allergies:  Allergies  Allergen Reactions  . Amlodipine     Leg swelling    Family History: Family History  Problem Relation Age of Onset  . Colon cancer Sister   . Cancer Sister        colon  . Heart disease Father   . Heart disease Mother   . Esophageal cancer Neg Hx   . Rectal cancer Neg Hx   . Stomach cancer Neg Hx     Social History:  reports that she quit smoking about 10 years ago. Her smoking use included cigarettes. She started smoking about 53 years ago. She has a 43.00 pack-year smoking history. She has never used smokeless tobacco. She reports that she does not drink alcohol or use drugs.  ROS: UROLOGY Frequent Urination?: No Hard to postpone urination?: Yes Burning/pain with urination?: No Get up at night to urinate?: Yes Leakage of urine?: No Urine stream starts and stops?: No Trouble starting stream?: No Do you have to strain to urinate?: No Blood in urine?: No Urinary tract infection?: No Sexually transmitted disease?: No Injury to kidneys or bladder?: No Painful intercourse?: No Weak stream?: No Currently pregnant?: No Vaginal bleeding?: No Last menstrual period?: n  Gastrointestinal Nausea?: No Vomiting?: No Indigestion/heartburn?: No Diarrhea?:  No Constipation?: Yes  Constitutional Fever: No Night sweats?: No Weight loss?: No Fatigue?: No  Skin Skin rash/lesions?: No Itching?: No  Eyes Blurred vision?: No Double vision?: No  Ears/Nose/Throat Sore throat?: No Sinus problems?: No  Hematologic/Lymphatic Swollen glands?: No Easy bruising?: No  Cardiovascular Leg swelling?: Yes Chest pain?: No  Respiratory Cough?: Yes Shortness of breath?: No  Endocrine Excessive thirst?: No  Musculoskeletal Back pain?: No Joint pain?: No  Neurological Headaches?: No Dizziness?: No  Psychologic Depression?: Yes Anxiety?: Yes  Physical Exam: BP (!) 165/80   Pulse 61   Resp 16   Ht 5\' 7"  (1.702 m)   Wt 97.8 kg (215 lb 8 oz)   SpO2 98%   BMI 33.75 kg/m   Constitutional:  Alert and oriented, No acute distress.   Laboratory Data: Lab Results  Component Value Date   WBC 5.8 01/21/2018   HGB 11.7 09/19/2017   HCT 36.7 01/21/2018   MCV 91.5 01/21/2018   PLT 135 (L) 01/21/2018    Lab Results  Component Value Date   CREATININE 1.75 (H) 03/02/2018    No results found for: PSA  No results found for: TESTOSTERONE  Lab Results  Component Value Date   HGBA1C 5.6 11/08/2016    Urinalysis    Component Value Date/Time   COLORURINE COLORLESS (A) 06/01/2016 0610   APPEARANCEUR CLEAR (A) 06/01/2016 0610   APPEARANCEUR Clear 10/23/2015 1406   LABSPEC 1.004 (L) 06/01/2016 0610   PHURINE 5.0 06/01/2016 0610   GLUCOSEU NEGATIVE 06/01/2016 0610   HGBUR NEGATIVE 06/01/2016 0610   BILIRUBINUR NEGATIVE 06/01/2016 0610   BILIRUBINUR neg 03/12/2016 1126   BILIRUBINUR Negative 10/23/2015 [place] 06/01/2016 0610   PROTEINUR NEGATIVE 06/01/2016 0610  UROBILINOGEN 0.2 03/12/2016 1126   UROBILINOGEN 0.2 07/24/2011 1347   NITRITE NEGATIVE 06/01/2016 0610   LEUKOCYTESUR NEGATIVE 06/01/2016 0610   LEUKOCYTESUR Negative 10/23/2015 1406    Pertinent Imaging: None  Assessment & Plan: Detrol  prescribed and refilled.  See as needed.  She believes that she is moving to [place].  I discussed percutaneous tibial nerve stimulation.  I did not prescribe desmopressin due to contraindication  There are no diagnoses linked to this encounter.  No follow-ups on file.  Reece Packer, MD  [facility] [address] [place], [place] [address] [phone]

## 2018-03-10 ENCOUNTER — Other Ambulatory Visit: Payer: Self-pay | Admitting: Internal Medicine

## 2018-03-10 NOTE — Telephone Encounter (Signed)
Spoke  With  Annie Main the  Pharmacist  At  [facility]   Pt  Has  Refills  On   The  [facility]   Message  Left on Pt  VM

## 2018-03-10 NOTE — Telephone Encounter (Signed)
Copied from [facility] [phone]. Topic: Quick Communication - Rx Refill/Question >> Mar 10, 2018 12:05 PM Robina Ade, Helene Kelp D wrote: Medication:rOPINIRole (REQUIP) 4 MG tablet for 90 tablets Has the patient contacted their pharmacy? Yes (Agent: If no, request that the patient contact the pharmacy for the refill.) Preferred Pharmacy (with phone number or street name): [id number] [address], [place]: Please be advised that RX refills may take up to 3 business days. We ask that you follow-up with your pharmacy.

## 2018-03-12 DIAGNOSIS — G4700 Insomnia, unspecified: Secondary | ICD-10-CM | POA: Diagnosis not present

## 2018-03-12 DIAGNOSIS — F332 Major depressive disorder, recurrent severe without psychotic features: Secondary | ICD-10-CM | POA: Diagnosis not present

## 2018-03-13 ENCOUNTER — Telehealth: Payer: Self-pay | Admitting: Cardiovascular Disease

## 2018-03-13 NOTE — Telephone Encounter (Signed)
Patient and her spouse want a referral from Dr. Rockey Situ on who to see in [facility] where they are soon moving .

## 2018-03-16 ENCOUNTER — Ambulatory Visit: Payer: Medicare Other

## 2018-03-16 NOTE — Telephone Encounter (Signed)
Spoke with patient and she states that they are moving to [place] and wanted to see if we could refer her to someone there. Advised that once she gets settled there to check with her primary care provider for recommendations. Let her know that we do not know any specific providers in that area and they may be able to guide her to the right one for her when she gets there. She was very appreciative for the call back and had no further questions at this time.

## 2018-03-17 ENCOUNTER — Other Ambulatory Visit: Payer: Self-pay

## 2018-03-17 ENCOUNTER — Ambulatory Visit (INDEPENDENT_AMBULATORY_CARE_PROVIDER_SITE_OTHER): Payer: Medicare Other | Admitting: Internal Medicine

## 2018-03-17 ENCOUNTER — Ambulatory Visit: Payer: Medicare Other | Admitting: Internal Medicine

## 2018-03-17 ENCOUNTER — Encounter: Payer: Self-pay | Admitting: Internal Medicine

## 2018-03-17 DIAGNOSIS — R05 Cough: Secondary | ICD-10-CM

## 2018-03-17 DIAGNOSIS — Z8719 Personal history of other diseases of the digestive system: Secondary | ICD-10-CM | POA: Diagnosis not present

## 2018-03-17 DIAGNOSIS — I10 Essential (primary) hypertension: Secondary | ICD-10-CM | POA: Diagnosis not present

## 2018-03-17 DIAGNOSIS — R059 Cough, unspecified: Secondary | ICD-10-CM

## 2018-03-17 MED ORDER — LISINOPRIL 40 MG PO TABS: 40.0000 mg | ORAL_TABLET | Freq: Every day | ORAL | 1 refills | Status: DC

## 2018-03-17 NOTE — Patient Instructions (Signed)
Your  blood pressure is better on the 40 mg lisinopril daily  Continue every other day lasix   I wish you God's blessings for save travels and a great new beginning!

## 2018-03-17 NOTE — Progress Notes (Signed)
Subjective:  Patient ID: Cassidy Ortiz, female    DOB: 16-Sep-1941  Age: 77 y.o. MRN: [id number]  CC: Diagnoses of Essential hypertension, Cough, and History of Barrett's esophagus were pertinent to this visit.  HPI Cassidy Ortiz presents for follow up on multiple issues raised at last visit on march 25.  Nonproductive cough . Patient was advised to resume a daily PPI given her history of Barrett's esophagus.    She states that her cough has resolved completely since resuming PPI  LE edema: secondary to venous insufficiency:  She was prescribed compression stockings for use at last visit and has been wearing them daily.  She is using lasix every other day.  planning a long autumobile trip to [facility] in a few weeks for her transition to life [place] and plans to ride in the back seat with legs elevated.  She was reminded to get out and stretch legs every 3-4 hours to prevent DVT     HTN:  Her Home readings have improved with the increased lisinopril dose.; her systolics have ranged from  130 to 140    Outpatient Medications Prior to Visit  Medication Sig Dispense Refill  . aspirin 81 MG tablet Take 162 mg by mouth daily at 12 noon. Take at noon.    Marland Kitchen atorvastatin (LIPITOR) 40 MG tablet Take 1 tablet (40 mg total) by mouth daily. 90 tablet 1  . buPROPion (WELLBUTRIN SR) 100 MG 12 hr tablet Take 100 mg by mouth 2 (two) times daily.    . carvedilol (COREG) 12.5 MG tablet TAKE 1 TABLET TWICE A DAY  WITH MEALS. 180 tablet 3  . cloNIDine (CATAPRES) 0.1 MG tablet TAKE 1 TABLET 3 TIMES A DAY 270 tablet 3  . doxazosin (CARDURA) 4 MG tablet Take 4 mg by mouth every evening.     Marland Kitchen esomeprazole (NEXIUM) 40 MG capsule Take 1 capsule (40 mg total) by mouth daily at 12 noon. 30 capsule 11  . furosemide (LASIX) 20 MG tablet One tablet every other day in the morning 30 tablet 3  . hydrALAZINE (APRESOLINE) 100 MG tablet TAKE 1 TABLET 3 TIMES A DAY 270 tablet 3  . hydrOXYzine (VISTARIL) 25 MG capsule 25 mg daily as  needed.  1  . letrozole (FEMARA) 2.5 MG tablet TAKE 1 TABLET DAILY 90 tablet 3  . Melatonin 5 MG TABS Take by mouth.    . mirtazapine (REMERON) 45 MG tablet Take 45 mg by mouth at bedtime.    . Multiple Vitamin (MULTIVITAMIN) tablet Take 1 tablet by mouth daily.      . nitroGLYCERIN (NITROSTAT) 0.4 MG SL tablet Place 1 tablet (0.4 mg total) under the tongue every 5 (five) minutes as needed for chest pain. 25 tablet 3  . rOPINIRole (REQUIP) 4 MG tablet Take 1 tablet (4 mg total) by mouth at bedtime. TAKE 1 TABLET (1 MG TOTAL) BY MOUTH DAILY AS NEEDED. 30 tablet 3  . SYNTHROID 175 MCG tablet TAKE 1 TABLET DAILY        (THYROID STIMULATING       HORMONE NEEDED PRIOR TO    ANY MORE REFILLS) 90 tablet 1  . temazepam (RESTORIL) 15 MG capsule Take 15 mg by mouth at bedtime.   0  . tolterodine (DETROL LA) 4 MG 24 hr capsule Take 1 capsule (4 mg total) by mouth daily. 30 capsule 11  . lisinopril (PRINIVIL,ZESTRIL) 20 MG tablet Take 1 tablet (20 mg total) by mouth daily. 30 tablet 0  .  lisinopril (PRINIVIL,ZESTRIL) 40 MG tablet Take 0.5 tablets (20 mg total) by mouth daily. 90 tablet 1   Facility-Administered Medications Prior to Visit  Medication Dose Route Frequency Provider Last Rate Last Dose  . 0.9 %  sodium chloride infusion  500 mL Intravenous Once Milus Banister, MD        Review of Systems;  Patient denies headache, fevers, malaise, unintentional weight loss, skin rash, eye pain, sinus congestion and sinus pain, sore throat, dysphagia,  hemoptysis , cough, dyspnea, wheezing, chest pain, palpitations, orthopnea, edema, abdominal pain, nausea, melena, diarrhea, constipation, flank pain, dysuria, hematuria, urinary  Frequency, nocturia, numbness, tingling, seizures,  Focal weakness, Loss of consciousness,  Tremor, insomnia, depression, anxiety, and suicidal ideation.      Objective:  BP (!) 158/62 (BP Location: Right Arm, Patient Position: Sitting, Cuff Size: Normal)   Pulse 71   Temp 98.4  F (36.9 C)   Wt 216 lb 12.8 oz (98.3 kg)   SpO2 96%   BMI 33.96 kg/m   BP Readings from Last 3 Encounters:  03/17/18 (!) 158/62  03/09/18 (!) 165/80  03/02/18 (!) 144/86    Wt Readings from Last 3 Encounters:  03/17/18 216 lb 12.8 oz (98.3 kg)  03/09/18 215 lb 8 oz (97.8 kg)  03/02/18 218 lb 9.6 oz (99.2 kg)    General appearance: alert, cooperative and appears stated age Ears: normal TM's and external ear canals both ears Throat: lips, mucosa, and tongue normal; teeth and gums normal Neck: no adenopathy, no carotid bruit, supple, symmetrical, trachea midline and thyroid not enlarged, symmetric, no tenderness/mass/nodules Back: symmetric, no curvature. ROM normal. No CVA tenderness. Lungs: clear to auscultation bilaterally Heart: regular rate and rhythm, S1, S2 normal, no murmur, click, rub or gallop Abdomen: soft, non-tender; bowel sounds normal; no masses,  no organomegaly Pulses: 2+ and symmetric Skin: Skin color, texture, turgor normal. No rashes or lesions Lymph nodes: Cervical, supraclavicular, and axillary nodes normal.  Lab Results  Component Value Date   HGBA1C 5.6 11/08/2016    Lab Results  Component Value Date   CREATININE 1.75 (H) 03/02/2018   CREATININE 1.96 (H) 02/10/2018   CREATININE 1.86 (H) 01/21/2018    Lab Results  Component Value Date   WBC 5.8 01/21/2018   HGB 11.7 09/19/2017   HCT 36.7 01/21/2018   PLT 135 (L) 01/21/2018   GLUCOSE 107 (H) 03/02/2018   CHOL 142 07/25/2017   TRIG 129.0 07/25/2017   HDL 58.40 07/25/2017   LDLCALC 58 07/25/2017   ALT 15 01/21/2018   AST 17 01/21/2018   NA 143 03/02/2018   K 3.9 03/02/2018   CL 111 03/02/2018   CREATININE 1.75 (H) 03/02/2018   BUN 37 (H) 03/02/2018   CO2 20 03/02/2018   TSH 0.69 07/25/2017   INR 1.03 05/22/2016   HGBA1C 5.6 11/08/2016    No results found.  Assessment & Plan:   Problem List Items Addressed This Visit    Hypertension (Chronic)    Improved on lisinopril 40 mg  dose  Home readings have been 182-993- systolic  Lab Results  Component Value Date   CREATININE 1.75 (H) 03/02/2018   Lab Results  Component Value Date   NA 143 03/02/2018   K 3.9 03/02/2018   CL 111 03/02/2018   CO2 20 03/02/2018         Relevant Medications   lisinopril (PRINIVIL,ZESTRIL) 40 MG tablet   History of Barrett's esophagus    By 2015 EGD.  Recommended to  continue daily PPI for life      Cough    Resolved with resuming of daily PPI ,  History of Barrett's.          I have discontinued Jamee Study's lisinopril. I have also changed her lisinopril. Additionally, I am having her maintain her aspirin, multivitamin, buPROPion, doxazosin, Melatonin, temazepam, mirtazapine, atorvastatin, hydrALAZINE, hydrOXYzine, SYNTHROID, letrozole, nitroGLYCERIN, furosemide, rOPINIRole, carvedilol, cloNIDine, esomeprazole, and tolterodine. We will continue to administer sodium chloride.  Meds ordered this encounter  Medications  . lisinopril (PRINIVIL,ZESTRIL) 40 MG tablet    Sig: Take 1 tablet (40 mg total) by mouth daily.    Dispense:  90 tablet    Refill:  1    Medications Discontinued During This Encounter  Medication Reason  . lisinopril (PRINIVIL,ZESTRIL) 20 MG tablet   . lisinopril (PRINIVIL,ZESTRIL) 40 MG tablet     Follow-up: No follow-ups on file.   Crecencio Mc, MD

## 2018-03-18 ENCOUNTER — Encounter: Payer: Self-pay | Admitting: Internal Medicine

## 2018-03-18 NOTE — Assessment & Plan Note (Signed)
Improved on lisinopril 40 mg dose  Home readings have been 322-025- systolic  Lab Results  Component Value Date   CREATININE 1.75 (H) 03/02/2018   Lab Results  Component Value Date   NA 143 03/02/2018   K 3.9 03/02/2018   CL 111 03/02/2018   CO2 20 03/02/2018

## 2018-03-18 NOTE — Assessment & Plan Note (Signed)
By 8115 EGD.  Recommended to continue daily PPI for life

## 2018-03-18 NOTE — Assessment & Plan Note (Signed)
Resolved with resuming of daily PPI ,  History of Barrett's.

## 2018-03-24 ENCOUNTER — Other Ambulatory Visit: Payer: Self-pay

## 2018-03-24 ENCOUNTER — Inpatient Hospital Stay (HOSPITAL_BASED_OUTPATIENT_CLINIC_OR_DEPARTMENT_OTHER): Payer: Medicare Other | Admitting: Hematology & Oncology

## 2018-03-24 ENCOUNTER — Inpatient Hospital Stay: Payer: Medicare Other

## 2018-03-24 ENCOUNTER — Encounter: Payer: Self-pay | Admitting: Hematology & Oncology

## 2018-03-24 ENCOUNTER — Inpatient Hospital Stay: Payer: Medicare Other | Attending: Hematology & Oncology

## 2018-03-24 VITALS — BP 172/65 | HR 55 | Temp 98.1°F | Resp 18 | Wt 215.8 lb

## 2018-03-24 DIAGNOSIS — Z85528 Personal history of other malignant neoplasm of kidney: Secondary | ICD-10-CM | POA: Diagnosis not present

## 2018-03-24 DIAGNOSIS — Z853 Personal history of malignant neoplasm of breast: Secondary | ICD-10-CM | POA: Diagnosis not present

## 2018-03-24 DIAGNOSIS — C50912 Malignant neoplasm of unspecified site of left female breast: Secondary | ICD-10-CM | POA: Diagnosis not present

## 2018-03-24 DIAGNOSIS — R609 Edema, unspecified: Secondary | ICD-10-CM | POA: Insufficient documentation

## 2018-03-24 DIAGNOSIS — Z170 Estrogen receptor positive status [ER+]: Secondary | ICD-10-CM | POA: Diagnosis not present

## 2018-03-24 DIAGNOSIS — Z95828 Presence of other vascular implants and grafts: Secondary | ICD-10-CM

## 2018-03-24 DIAGNOSIS — C50911 Malignant neoplasm of unspecified site of right female breast: Secondary | ICD-10-CM

## 2018-03-24 LAB — CBC WITH DIFFERENTIAL (CANCER CENTER ONLY)
BASOS ABS: 0 10*3/uL (ref 0.0–0.1)
Basophils Relative: 0 %
EOS PCT: 2 %
Eosinophils Absolute: 0.1 10*3/uL (ref 0.0–0.5)
HCT: 37.2 % (ref 34.8–46.6)
HEMOGLOBIN: 12.2 g/dL (ref 11.6–15.9)
LYMPHS PCT: 20 %
Lymphs Abs: 1.2 10*3/uL (ref 0.9–3.3)
MCH: 30.2 pg (ref 26.0–34.0)
MCHC: 32.8 g/dL (ref 32.0–36.0)
MCV: 92.1 fL (ref 81.0–101.0)
Monocytes Absolute: 0.4 10*3/uL (ref 0.1–0.9)
Monocytes Relative: 7 %
NEUTROS ABS: 4.1 10*3/uL (ref 1.5–6.5)
NEUTROS PCT: 71 %
Platelet Count: 144 10*3/uL — ABNORMAL LOW (ref 145–400)
RBC: 4.04 MIL/uL (ref 3.70–5.32)
RDW: 14.5 % (ref 11.1–15.7)
WBC: 5.8 10*3/uL (ref 3.9–10.0)

## 2018-03-24 LAB — CMP (CANCER CENTER ONLY)
ALK PHOS: 118 U/L — AB (ref 26–84)
ALT: 20 U/L (ref 10–47)
ANION GAP: 8 (ref 5–15)
AST: 24 U/L (ref 11–38)
Albumin: 3.3 g/dL — ABNORMAL LOW (ref 3.5–5.0)
BILIRUBIN TOTAL: 0.5 mg/dL (ref 0.2–1.6)
BUN: 36 mg/dL — ABNORMAL HIGH (ref 7–22)
CO2: 29 mmol/L (ref 18–33)
Calcium: 9.4 mg/dL (ref 8.0–10.3)
Chloride: 110 mmol/L — ABNORMAL HIGH (ref 98–108)
Creatinine: 2 mg/dL — ABNORMAL HIGH (ref 0.60–1.20)
Glucose, Bld: 135 mg/dL — ABNORMAL HIGH (ref 73–118)
Potassium: 3.8 mmol/L (ref 3.3–4.7)
Sodium: 147 mmol/L — ABNORMAL HIGH (ref 128–145)
TOTAL PROTEIN: 6.5 g/dL (ref 6.4–8.1)

## 2018-03-24 MED ORDER — HEPARIN SOD (PORK) LOCK FLUSH 100 UNIT/ML IV SOLN
500.0000 [IU] | Freq: Once | INTRAVENOUS | Status: AC
  Administered 2018-03-24: 500 [IU] via INTRAVENOUS
  Filled 2018-03-24: qty 5

## 2018-03-24 MED ORDER — SODIUM CHLORIDE 0.9% FLUSH
10.0000 mL | INTRAVENOUS | Status: DC | PRN
  Administered 2018-03-24: 10 mL via INTRAVENOUS
  Filled 2018-03-24: qty 10

## 2018-03-24 NOTE — Progress Notes (Signed)
Hematology and Oncology Follow Up Visit  Cassidy Ortiz [id number] 02/25/1941 77 y.o. 03/24/2018   Principle Diagnosis:  1. 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 2. 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 Intermittent iron-deficiency anemia 4.  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  It is a sad day for [place].  She and her husband are moving down to [place].  They were down there in March.  They have all of their family down in [place].  It would be easier for them to be with their family.  I totally understand this.    It is amazing how God works.  Their house got sold in a day.  They found a place to live in 1 day.  They are moving on Friday.  They will be moving to [facility], [place].  This is close to [place].  I will have to find them a oncologist down in that area.  I know that [facility] have an office in [place].  I gave Cassidy Ortiz the phone number.  Once she makes contact with the office down in [place], we can send information to them.  She is on a lot of medications.  She has bad blood pressure.  She is on 6 blood pressure medications.  I told her that she needs to be on these for right now.  She has compression stockings on her legs.  This is really helped with the edema in her legs.  She is on Femara.  She is doing well with Femara.  She is had no problems with osteoporosis.  Her appetite is good.  There is no nausea or vomiting.  She is had no change in bowel or bladder habits.    Her last mammogram was in September 2018.  She is not due for another one until September 2019.  Her Port-A-Cath is still in.  I think we can get this taken out.  We will try to  get this taken out before she leaves on Friday.  We should be able to get this done.  Overall, her performance status is ECOG 1.  Medications:  Allergies as of 03/24/2018      Reactions   Amlodipine    Leg swelling      Medication List        Accurate as of 03/24/18 12:07 PM. Always use your most recent med list.          aspirin 81 MG tablet Take 162 mg by mouth daily at 12 noon. Take at noon.   atorvastatin 40 MG tablet Commonly known as:  LIPITOR Take 1 tablet (40 mg total) by mouth daily.   buPROPion 100 MG 12 hr tablet Commonly known as:  WELLBUTRIN SR Take 100 mg by mouth 2 (two) times daily.   carvedilol 12.5 MG tablet Commonly known as:  COREG TAKE 1 TABLET TWICE A DAY  WITH MEALS.   cloNIDine 0.1 MG tablet Commonly known as:  CATAPRES TAKE 1 TABLET 3 TIMES A DAY   doxazosin 4 MG tablet Commonly known as:  CARDURA Take 4 mg by mouth every evening.   esomeprazole  40 MG capsule Commonly known as:  NEXIUM Take 1 capsule (40 mg total) by mouth daily at 12 noon.   furosemide 20 MG tablet Commonly known as:  LASIX One tablet every other day in the morning   hydrALAZINE 100 MG tablet Commonly known as:  APRESOLINE TAKE 1 TABLET 3 TIMES A DAY   hydrOXYzine 25 MG capsule Commonly known as:  VISTARIL 25 mg daily as needed.   letrozole 2.5 MG tablet Commonly known as:  FEMARA TAKE 1 TABLET DAILY   lisinopril 40 MG tablet Commonly known as:  PRINIVIL,ZESTRIL Take 1 tablet (40 mg total) by mouth daily.   Melatonin 5 MG Tabs Take by mouth.   mirtazapine 45 MG tablet Commonly known as:  REMERON Take 45 mg by mouth at bedtime.   multivitamin tablet Take 1 tablet by mouth daily.   nitroGLYCERIN 0.4 MG SL tablet Commonly known as:  NITROSTAT Place 1 tablet (0.4 mg total) under the tongue every 5 (five) minutes as needed for chest pain.   rOPINIRole 4 MG tablet Commonly known as:  REQUIP Take 1 tablet (4 mg total) by mouth at bedtime. TAKE 1 TABLET  (1 MG TOTAL) BY MOUTH DAILY AS NEEDED.   SYNTHROID 175 MCG tablet Generic drug:  levothyroxine TAKE 1 TABLET DAILY        (THYROID STIMULATING       HORMONE NEEDED PRIOR TO    ANY MORE REFILLS)   temazepam 15 MG capsule Commonly known as:  RESTORIL Take 15 mg by mouth at bedtime.   tolterodine 4 MG 24 hr capsule Commonly known as:  DETROL LA Take 1 capsule (4 mg total) by mouth daily.       Allergies:  Allergies  Allergen Reactions  . Amlodipine     Leg swelling    Past Medical History, Surgical history, Social history, and Family History were reviewed and updated.  Review of Systems: Review of Systems  Constitutional: Positive for malaise/fatigue.  HENT: Negative.   Eyes: Negative.   Respiratory: Negative.   Cardiovascular: Negative.   Gastrointestinal: Negative.   Genitourinary: Negative.   Musculoskeletal: Positive for myalgias.  Skin: Negative.   Neurological: Negative.   Endo/Heme/Allergies: Negative.   Psychiatric/Behavioral: Negative.      Physical Exam:  weight is 215 lb 12 oz (97.9 kg). Her oral temperature is 98.1 F (36.7 C). Her blood pressure is 172/65 (abnormal) and her pulse is 55 (abnormal). Her respiration is 18 and oxygen saturation is 97%.   Wt Readings from Last 3 Encounters:  03/24/18 215 lb 12 oz (97.9 kg)  03/17/18 216 lb 12.8 oz (98.3 kg)  03/09/18 215 lb 8 oz (97.8 kg)   Physical Exam  Constitutional: She is oriented to person, place, and time.  HENT:  Head: Normocephalic and atraumatic.  Mouth/Throat: Oropharynx is clear and moist.  Eyes: Pupils are equal, round, and reactive to light. EOM are normal.  Neck: Normal range of motion.  Cardiovascular: Normal rate, regular rhythm and normal heart sounds.  Pulmonary/Chest: Effort normal and breath sounds normal.  Abdominal: Soft. Bowel sounds are normal.  Musculoskeletal: Normal range of motion. She exhibits no edema, tenderness or deformity.  Lymphadenopathy:    She has no cervical  adenopathy.  Neurological: She is alert and oriented to person, place, and time.  Skin: Skin is warm and dry. No rash noted. No erythema.  Psychiatric: She has a normal mood and affect. Her behavior is normal. Judgment and thought content normal.  Vitals reviewed.  Lab Results  Component Value Date   WBC 5.8 03/24/2018   HGB 11.7 09/19/2017   HCT 37.2 03/24/2018   MCV 92.1 03/24/2018   PLT 144 (L) 03/24/2018   Lab Results  Component Value Date   FERRITIN 22 02/10/2018   IRON 58 02/10/2018   TIBC 269 02/10/2018   UIBC 162 04/08/2016   IRONPCTSAT 22 02/10/2018   Lab Results  Component Value Date   RBC 4.04 03/24/2018   No results found for: KPAFRELGTCHN, LAMBDASER, KAPLAMBRATIO No results found for: IGGSERUM, IGA, IGMSERUM No results found for: Kathrynn Ducking, MSPIKE, SPEI   Chemistry      Component Value Date/Time   NA 147 (H) 03/24/2018 1130   NA 147 (H) 09/19/2017 1044   NA 144 11/08/2016 1059   K 3.8 03/24/2018 1130   K 4.5 09/19/2017 1044   K 3.9 11/08/2016 1059   CL 110 (H) 03/24/2018 1130   CL 109 (H) 09/19/2017 1044   CO2 29 03/24/2018 1130   CO2 28 09/19/2017 1044   CO2 24 11/08/2016 1059   BUN 36 (H) 03/24/2018 1130   BUN 32 (H) 09/19/2017 1044   BUN 35.7 (H) 11/08/2016 1059   CREATININE 2.00 (H) 03/24/2018 1130   CREATININE 1.96 (H) 02/10/2018 1740   CREATININE 1.4 (H) 11/08/2016 1059      Component Value Date/Time   CALCIUM 9.4 03/24/2018 1130   CALCIUM 9.7 09/19/2017 1044   CALCIUM 9.4 11/08/2016 1059   ALKPHOS 118 (H) 03/24/2018 1130   ALKPHOS 91 (H) 09/19/2017 1044   ALKPHOS 101 11/08/2016 1059   AST 24 03/24/2018 1130   AST 13 11/08/2016 1059   ALT 20 03/24/2018 1130   ALT 20 09/19/2017 1044   ALT 11 11/08/2016 1059   BILITOT 0.5 03/24/2018 1130   BILITOT 0.33 11/08/2016 1059      Impression and Plan: Cassidy Ortiz is a very pleasant 77 yo caucasian female with history of stage I ductal  carcinoma of the right breast.   She also has history of renal cell carcinoma.   She has since developed a new invasive ductal carcinoma of the left breast with a high Oncotype score. She had a lumpectomy followed by 4 cycles of chemotherapy. This was completed in August 2017 This was followed by radiation.  She currently is on Femara.  I would keep her on Femara for a total of 5 years.  For right now, I do not see that there is any evidence of recurrent disease.  Again, we will totally miss Cassidy Ortiz.  I have known her for 18 years.  We will really Ms. her and her husband.  They always came together.  I told her that if there are any issues we can always help her out.  I know that she will get fantastic care down in [place].    Volanda Napoleon, MD 4/16/201912:07 PM

## 2018-03-25 ENCOUNTER — Other Ambulatory Visit (HOSPITAL_COMMUNITY): Payer: Medicare Other

## 2018-03-25 ENCOUNTER — Other Ambulatory Visit: Payer: Self-pay | Admitting: Radiology

## 2018-03-26 ENCOUNTER — Ambulatory Visit (HOSPITAL_COMMUNITY)
Admission: RE | Admit: 2018-03-26 | Discharge: 2018-03-26 | Disposition: A | Discharge status: Home / Self Care | Payer: Medicare Other | Source: Ambulatory Visit | Attending: Hematology & Oncology | Admitting: Hematology & Oncology

## 2018-03-26 ENCOUNTER — Encounter (HOSPITAL_COMMUNITY): Payer: Self-pay | Admitting: Interventional Radiology

## 2018-03-26 DIAGNOSIS — C50911 Malignant neoplasm of unspecified site of right female breast: Secondary | ICD-10-CM

## 2018-03-26 DIAGNOSIS — Z853 Personal history of malignant neoplasm of breast: Secondary | ICD-10-CM | POA: Insufficient documentation

## 2018-03-26 DIAGNOSIS — Z79899 Other long term (current) drug therapy: Secondary | ICD-10-CM | POA: Diagnosis not present

## 2018-03-26 DIAGNOSIS — Z888 Allergy status to other drugs, medicaments and biological substances status: Secondary | ICD-10-CM | POA: Insufficient documentation

## 2018-03-26 DIAGNOSIS — R5383 Other fatigue: Secondary | ICD-10-CM | POA: Insufficient documentation

## 2018-03-26 DIAGNOSIS — Z9221 Personal history of antineoplastic chemotherapy: Secondary | ICD-10-CM | POA: Insufficient documentation

## 2018-03-26 DIAGNOSIS — M792 Neuralgia and neuritis, unspecified: Secondary | ICD-10-CM | POA: Diagnosis not present

## 2018-03-26 DIAGNOSIS — Z9889 Other specified postprocedural states: Secondary | ICD-10-CM | POA: Diagnosis not present

## 2018-03-26 DIAGNOSIS — Z85528 Personal history of other malignant neoplasm of kidney: Secondary | ICD-10-CM | POA: Diagnosis not present

## 2018-03-26 DIAGNOSIS — Z170 Estrogen receptor positive status [ER+]: Secondary | ICD-10-CM

## 2018-03-26 DIAGNOSIS — Z79890 Hormone replacement therapy (postmenopausal): Secondary | ICD-10-CM | POA: Insufficient documentation

## 2018-03-26 DIAGNOSIS — Z452 Encounter for adjustment and management of vascular access device: Secondary | ICD-10-CM | POA: Insufficient documentation

## 2018-03-26 DIAGNOSIS — Z7982 Long term (current) use of aspirin: Secondary | ICD-10-CM | POA: Insufficient documentation

## 2018-03-26 DIAGNOSIS — I10 Essential (primary) hypertension: Secondary | ICD-10-CM | POA: Diagnosis not present

## 2018-03-26 HISTORY — PX: IR REMOVAL TUN ACCESS W/ PORT W/O FL MOD SED: IMG2290

## 2018-03-26 MED ORDER — LIDOCAINE HCL (PF) 1 % IJ SOLN
INTRAMUSCULAR | Status: AC | PRN
  Administered 2018-03-26: 10 mL

## 2018-03-26 MED ORDER — LIDOCAINE HCL 1 % IJ SOLN
INTRAMUSCULAR | Status: AC
  Filled 2018-03-26: qty 20

## 2018-03-26 NOTE — Procedures (Signed)
Interventional Radiology Procedure Note  Procedure: Removal of right chest port.   Complications: None  Estimated Blood Loss: None  Recommendations: - DC home  Signed,  Criselda Peaches, MD

## 2018-03-30 ENCOUNTER — Ambulatory Visit: Payer: Medicare Other

## 2018-04-10 ENCOUNTER — Telehealth: Payer: Self-pay | Admitting: Internal Medicine

## 2018-04-10 DIAGNOSIS — I129 Hypertensive chronic kidney disease with stage 1 through stage 4 chronic kidney disease, or unspecified chronic kidney disease: Secondary | ICD-10-CM | POA: Diagnosis not present

## 2018-04-10 DIAGNOSIS — N390 Urinary tract infection, site not specified: Secondary | ICD-10-CM | POA: Diagnosis not present

## 2018-04-10 DIAGNOSIS — R300 Dysuria: Secondary | ICD-10-CM | POA: Diagnosis not present

## 2018-04-10 DIAGNOSIS — R600 Localized edema: Secondary | ICD-10-CM | POA: Diagnosis not present

## 2018-04-10 DIAGNOSIS — R0602 Shortness of breath: Secondary | ICD-10-CM | POA: Diagnosis not present

## 2018-04-10 DIAGNOSIS — R319 Hematuria, unspecified: Secondary | ICD-10-CM | POA: Diagnosis not present

## 2018-04-10 DIAGNOSIS — R079 Chest pain, unspecified: Secondary | ICD-10-CM | POA: Diagnosis not present

## 2018-04-10 DIAGNOSIS — M79604 Pain in right leg: Secondary | ICD-10-CM | POA: Diagnosis not present

## 2018-04-10 DIAGNOSIS — R252 Cramp and spasm: Secondary | ICD-10-CM | POA: Diagnosis not present

## 2018-04-10 DIAGNOSIS — M79605 Pain in left leg: Secondary | ICD-10-CM | POA: Diagnosis not present

## 2018-04-10 DIAGNOSIS — N189 Chronic kidney disease, unspecified: Secondary | ICD-10-CM | POA: Diagnosis not present

## 2018-04-10 DIAGNOSIS — R9431 Abnormal electrocardiogram [ECG] [EKG]: Secondary | ICD-10-CM | POA: Diagnosis not present

## 2018-04-10 NOTE — Telephone Encounter (Signed)
Please advise 

## 2018-04-10 NOTE — Telephone Encounter (Signed)
Spoke with the pt after speaking with Dr. Derrel Nip verbally and while talking to the pt she stated that she was having some SoBr. I advised the pt that she should go on to the ER in [place] to be evaluated. The pt gave a verbal understanding and stated that she is going there now.

## 2018-04-10 NOTE — Telephone Encounter (Signed)
Copied from [place] [id number]. Topic: Quick Communication - Rx Refill/Question >> Apr 10, 2018  1:55 PM Lennox Solders wrote: Medication:furosemide 20 mg and synthroid 175 mcg  Has the patient contacted their pharmacy? No. Pt is living in [place] now and will not be back. Pt is having leg swelling that the reason daughter is requesting increase (Agent: If no, request that the patient contact the pharmacy for the refill.) Preferred Pharmacy (with phone number [facility] street [facility] [place] [phone]. Pt daughter is calling and would like to increase the furosemide 20 mg to once a day instead of every other day. Pt has not been able to find md yet

## 2018-04-14 DIAGNOSIS — I671 Cerebral aneurysm, nonruptured: Secondary | ICD-10-CM | POA: Diagnosis not present

## 2018-04-14 DIAGNOSIS — G4730 Sleep apnea, unspecified: Secondary | ICD-10-CM | POA: Diagnosis not present

## 2018-04-14 DIAGNOSIS — I10 Essential (primary) hypertension: Secondary | ICD-10-CM | POA: Diagnosis not present

## 2018-04-14 DIAGNOSIS — E7800 Pure hypercholesterolemia, unspecified: Secondary | ICD-10-CM | POA: Diagnosis not present

## 2018-04-14 DIAGNOSIS — N189 Chronic kidney disease, unspecified: Secondary | ICD-10-CM | POA: Diagnosis not present

## 2018-04-14 DIAGNOSIS — E669 Obesity, unspecified: Secondary | ICD-10-CM | POA: Diagnosis not present

## 2018-04-14 DIAGNOSIS — Z87891 Personal history of nicotine dependence: Secondary | ICD-10-CM | POA: Diagnosis not present

## 2018-04-14 DIAGNOSIS — I5030 Unspecified diastolic (congestive) heart failure: Secondary | ICD-10-CM | POA: Diagnosis not present

## 2018-04-14 DIAGNOSIS — C50919 Malignant neoplasm of unspecified site of unspecified female breast: Secondary | ICD-10-CM | POA: Diagnosis not present

## 2018-04-17 DIAGNOSIS — I10 Essential (primary) hypertension: Secondary | ICD-10-CM | POA: Diagnosis not present

## 2018-04-17 DIAGNOSIS — G4733 Obstructive sleep apnea (adult) (pediatric): Secondary | ICD-10-CM | POA: Diagnosis not present

## 2018-04-17 DIAGNOSIS — E039 Hypothyroidism, unspecified: Secondary | ICD-10-CM | POA: Diagnosis not present

## 2018-04-17 DIAGNOSIS — Z6834 Body mass index (BMI) 34.0-34.9, adult: Secondary | ICD-10-CM | POA: Diagnosis not present

## 2018-04-21 DIAGNOSIS — I10 Essential (primary) hypertension: Secondary | ICD-10-CM | POA: Diagnosis not present

## 2018-04-22 ENCOUNTER — Ambulatory Visit: Payer: Medicare Other | Admitting: Hematology & Oncology

## 2018-04-22 ENCOUNTER — Other Ambulatory Visit: Payer: Medicare Other

## 2018-04-23 ENCOUNTER — Other Ambulatory Visit: Payer: Self-pay | Admitting: Internal Medicine

## 2018-04-23 DIAGNOSIS — E039 Hypothyroidism, unspecified: Secondary | ICD-10-CM

## 2018-04-23 DIAGNOSIS — Z87891 Personal history of nicotine dependence: Secondary | ICD-10-CM | POA: Diagnosis not present

## 2018-04-23 DIAGNOSIS — G4733 Obstructive sleep apnea (adult) (pediatric): Secondary | ICD-10-CM | POA: Diagnosis not present

## 2018-04-23 DIAGNOSIS — G4700 Insomnia, unspecified: Secondary | ICD-10-CM | POA: Diagnosis not present

## 2018-04-23 DIAGNOSIS — R0600 Dyspnea, unspecified: Secondary | ICD-10-CM | POA: Diagnosis not present

## 2018-04-27 DIAGNOSIS — I10 Essential (primary) hypertension: Secondary | ICD-10-CM | POA: Diagnosis not present

## 2018-04-27 DIAGNOSIS — E039 Hypothyroidism, unspecified: Secondary | ICD-10-CM | POA: Diagnosis not present

## 2018-05-03 DIAGNOSIS — S8991XA Unspecified injury of right lower leg, initial encounter: Secondary | ICD-10-CM | POA: Diagnosis not present

## 2018-05-03 DIAGNOSIS — M25461 Effusion, right knee: Secondary | ICD-10-CM | POA: Diagnosis not present

## 2018-05-03 DIAGNOSIS — Z743 Need for continuous supervision: Secondary | ICD-10-CM | POA: Diagnosis not present

## 2018-05-03 DIAGNOSIS — K219 Gastro-esophageal reflux disease without esophagitis: Secondary | ICD-10-CM | POA: Diagnosis not present

## 2018-05-03 DIAGNOSIS — S79911A Unspecified injury of right hip, initial encounter: Secondary | ICD-10-CM | POA: Diagnosis not present

## 2018-05-03 DIAGNOSIS — I10 Essential (primary) hypertension: Secondary | ICD-10-CM | POA: Diagnosis not present

## 2018-05-03 DIAGNOSIS — S82141A Displaced bicondylar fracture of right tibia, initial encounter for closed fracture: Secondary | ICD-10-CM | POA: Diagnosis not present

## 2018-05-03 DIAGNOSIS — F418 Other specified anxiety disorders: Secondary | ICD-10-CM | POA: Diagnosis not present

## 2018-05-03 DIAGNOSIS — S80911A Unspecified superficial injury of right knee, initial encounter: Secondary | ICD-10-CM | POA: Diagnosis not present

## 2018-05-03 DIAGNOSIS — E039 Hypothyroidism, unspecified: Secondary | ICD-10-CM | POA: Diagnosis not present

## 2018-05-03 DIAGNOSIS — S82144A Nondisplaced bicondylar fracture of right tibia, initial encounter for closed fracture: Secondary | ICD-10-CM | POA: Diagnosis not present

## 2018-05-04 DIAGNOSIS — M25461 Effusion, right knee: Secondary | ICD-10-CM | POA: Diagnosis not present

## 2018-05-04 DIAGNOSIS — S82144A Nondisplaced bicondylar fracture of right tibia, initial encounter for closed fracture: Secondary | ICD-10-CM | POA: Diagnosis not present

## 2018-05-04 DIAGNOSIS — S82001A Unspecified fracture of right patella, initial encounter for closed fracture: Secondary | ICD-10-CM | POA: Diagnosis not present

## 2018-05-04 DIAGNOSIS — S8991XA Unspecified injury of right lower leg, initial encounter: Secondary | ICD-10-CM | POA: Diagnosis not present

## 2018-05-04 DIAGNOSIS — E039 Hypothyroidism, unspecified: Secondary | ICD-10-CM | POA: Diagnosis present

## 2018-05-04 DIAGNOSIS — M25561 Pain in right knee: Secondary | ICD-10-CM | POA: Diagnosis not present

## 2018-05-04 DIAGNOSIS — S82191A Other fracture of upper end of right tibia, initial encounter for closed fracture: Secondary | ICD-10-CM | POA: Diagnosis not present

## 2018-05-04 DIAGNOSIS — S82141A Displaced bicondylar fracture of right tibia, initial encounter for closed fracture: Secondary | ICD-10-CM | POA: Diagnosis present

## 2018-05-04 DIAGNOSIS — Z743 Need for continuous supervision: Secondary | ICD-10-CM | POA: Diagnosis not present

## 2018-05-04 DIAGNOSIS — F418 Other specified anxiety disorders: Secondary | ICD-10-CM | POA: Diagnosis present

## 2018-05-04 DIAGNOSIS — K219 Gastro-esophageal reflux disease without esophagitis: Secondary | ICD-10-CM | POA: Diagnosis present

## 2018-05-04 DIAGNOSIS — I10 Essential (primary) hypertension: Secondary | ICD-10-CM | POA: Diagnosis present

## 2018-05-06 ENCOUNTER — Telehealth: Payer: Self-pay | Admitting: Internal Medicine

## 2018-05-06 NOTE — Telephone Encounter (Signed)
Last two office notes prior to 12-16-17 has been faxed to [place] at [facility] in [place] at [phone]

## 2018-05-06 NOTE — Telephone Encounter (Signed)
Copied from [place] [id number]. Topic: Quick Communication - See Telephone Encounter >> May 06, 2018  1:31 PM Vernona Rieger wrote: CRM for notification. See Telephone encounter for: 05/06/18.  [place] from [facility] in [place] ( her new pulmonary dr ) called and needs any office visit notes prior to her sleep study. She had the sleep study on 12/16/17. Fax number is [phone]  Call back if needed [phone] ext [phone]

## 2018-05-08 DIAGNOSIS — S82191A Other fracture of upper end of right tibia, initial encounter for closed fracture: Secondary | ICD-10-CM | POA: Diagnosis not present

## 2018-05-08 DIAGNOSIS — M25561 Pain in right knee: Secondary | ICD-10-CM | POA: Diagnosis not present

## 2018-05-14 DIAGNOSIS — M7051 Other bursitis of knee, right knee: Secondary | ICD-10-CM | POA: Diagnosis not present

## 2018-05-15 ENCOUNTER — Telehealth (INDEPENDENT_AMBULATORY_CARE_PROVIDER_SITE_OTHER): Payer: Self-pay

## 2018-05-15 NOTE — Telephone Encounter (Signed)
This note was created in the patient's wife's chart by mistake, or in error.

## 2018-05-15 NOTE — Telephone Encounter (Signed)
Patient called and stated that he is out of the state for the next month and that he would like his upcoming appointment canceled. He says that if you have any questions, then he can be reached at the alternate number provided.  Please cancel his appointment that's coming up for next week.

## 2018-05-19 DIAGNOSIS — E7800 Pure hypercholesterolemia, unspecified: Secondary | ICD-10-CM | POA: Diagnosis not present

## 2018-05-19 DIAGNOSIS — E669 Obesity, unspecified: Secondary | ICD-10-CM | POA: Diagnosis not present

## 2018-05-19 DIAGNOSIS — N189 Chronic kidney disease, unspecified: Secondary | ICD-10-CM | POA: Diagnosis not present

## 2018-05-19 DIAGNOSIS — G4730 Sleep apnea, unspecified: Secondary | ICD-10-CM | POA: Diagnosis not present

## 2018-05-19 DIAGNOSIS — I671 Cerebral aneurysm, nonruptured: Secondary | ICD-10-CM | POA: Diagnosis not present

## 2018-05-19 DIAGNOSIS — I10 Essential (primary) hypertension: Secondary | ICD-10-CM | POA: Diagnosis not present

## 2018-05-19 DIAGNOSIS — I5030 Unspecified diastolic (congestive) heart failure: Secondary | ICD-10-CM | POA: Diagnosis not present

## 2018-05-19 DIAGNOSIS — C50919 Malignant neoplasm of unspecified site of unspecified female breast: Secondary | ICD-10-CM | POA: Diagnosis not present

## 2018-05-19 DIAGNOSIS — Z87891 Personal history of nicotine dependence: Secondary | ICD-10-CM | POA: Diagnosis not present

## 2018-05-20 DIAGNOSIS — K5900 Constipation, unspecified: Secondary | ICD-10-CM | POA: Diagnosis not present

## 2018-05-20 DIAGNOSIS — Z6833 Body mass index (BMI) 33.0-33.9, adult: Secondary | ICD-10-CM | POA: Diagnosis not present

## 2018-05-20 DIAGNOSIS — N183 Chronic kidney disease, stage 3 (moderate): Secondary | ICD-10-CM | POA: Diagnosis not present

## 2018-05-20 DIAGNOSIS — I10 Essential (primary) hypertension: Secondary | ICD-10-CM | POA: Diagnosis not present

## 2018-05-25 ENCOUNTER — Other Ambulatory Visit: Payer: Self-pay | Admitting: Internal Medicine

## 2018-05-27 ENCOUNTER — Other Ambulatory Visit: Payer: Self-pay | Admitting: Internal Medicine

## 2018-05-27 DIAGNOSIS — S82191D Other fracture of upper end of right tibia, subsequent encounter for closed fracture with routine healing: Secondary | ICD-10-CM | POA: Diagnosis not present

## 2018-05-27 DIAGNOSIS — M25561 Pain in right knee: Secondary | ICD-10-CM | POA: Diagnosis not present

## 2018-06-08 DIAGNOSIS — G4730 Sleep apnea, unspecified: Secondary | ICD-10-CM | POA: Diagnosis not present

## 2018-06-08 DIAGNOSIS — F329 Major depressive disorder, single episode, unspecified: Secondary | ICD-10-CM | POA: Diagnosis not present

## 2018-06-08 DIAGNOSIS — Z6834 Body mass index (BMI) 34.0-34.9, adult: Secondary | ICD-10-CM | POA: Diagnosis not present

## 2018-06-10 DIAGNOSIS — N184 Chronic kidney disease, stage 4 (severe): Secondary | ICD-10-CM | POA: Diagnosis not present

## 2018-06-10 DIAGNOSIS — I129 Hypertensive chronic kidney disease with stage 1 through stage 4 chronic kidney disease, or unspecified chronic kidney disease: Secondary | ICD-10-CM | POA: Diagnosis not present

## 2018-06-17 DIAGNOSIS — M25561 Pain in right knee: Secondary | ICD-10-CM | POA: Diagnosis not present

## 2018-06-17 DIAGNOSIS — S82191D Other fracture of upper end of right tibia, subsequent encounter for closed fracture with routine healing: Secondary | ICD-10-CM | POA: Diagnosis not present

## 2018-06-18 DIAGNOSIS — Z6835 Body mass index (BMI) 35.0-35.9, adult: Secondary | ICD-10-CM | POA: Diagnosis not present

## 2018-06-18 DIAGNOSIS — Z1211 Encounter for screening for malignant neoplasm of colon: Secondary | ICD-10-CM | POA: Diagnosis not present

## 2018-06-19 ENCOUNTER — Other Ambulatory Visit: Payer: Self-pay | Admitting: Internal Medicine

## 2018-06-19 DIAGNOSIS — I10 Essential (primary) hypertension: Secondary | ICD-10-CM | POA: Diagnosis not present

## 2018-06-24 ENCOUNTER — Ambulatory Visit: Payer: Medicare Other | Admitting: Internal Medicine

## 2018-06-25 DIAGNOSIS — N184 Chronic kidney disease, stage 4 (severe): Secondary | ICD-10-CM | POA: Diagnosis not present

## 2018-07-01 DIAGNOSIS — R05 Cough: Secondary | ICD-10-CM | POA: Diagnosis not present

## 2018-07-01 DIAGNOSIS — J449 Chronic obstructive pulmonary disease, unspecified: Secondary | ICD-10-CM | POA: Diagnosis not present

## 2018-07-01 DIAGNOSIS — Z6834 Body mass index (BMI) 34.0-34.9, adult: Secondary | ICD-10-CM | POA: Diagnosis not present

## 2018-07-03 ENCOUNTER — Encounter: Payer: Self-pay | Admitting: Internal Medicine

## 2018-07-10 DIAGNOSIS — K8020 Calculus of gallbladder without cholecystitis without obstruction: Secondary | ICD-10-CM | POA: Diagnosis not present

## 2018-07-10 DIAGNOSIS — Z6834 Body mass index (BMI) 34.0-34.9, adult: Secondary | ICD-10-CM | POA: Diagnosis not present

## 2018-07-10 DIAGNOSIS — D3502 Benign neoplasm of left adrenal gland: Secondary | ICD-10-CM | POA: Diagnosis not present

## 2018-07-10 DIAGNOSIS — J449 Chronic obstructive pulmonary disease, unspecified: Secondary | ICD-10-CM | POA: Diagnosis not present

## 2018-07-10 DIAGNOSIS — R001 Bradycardia, unspecified: Secondary | ICD-10-CM | POA: Diagnosis not present

## 2018-07-10 DIAGNOSIS — R05 Cough: Secondary | ICD-10-CM | POA: Diagnosis not present

## 2018-07-15 DIAGNOSIS — J449 Chronic obstructive pulmonary disease, unspecified: Secondary | ICD-10-CM | POA: Diagnosis not present

## 2018-07-15 DIAGNOSIS — Z87891 Personal history of nicotine dependence: Secondary | ICD-10-CM | POA: Diagnosis not present

## 2018-07-15 DIAGNOSIS — G4733 Obstructive sleep apnea (adult) (pediatric): Secondary | ICD-10-CM | POA: Diagnosis not present

## 2018-07-21 ENCOUNTER — Other Ambulatory Visit: Payer: Self-pay | Admitting: Internal Medicine

## 2018-07-21 DIAGNOSIS — E039 Hypothyroidism, unspecified: Secondary | ICD-10-CM

## 2018-07-22 NOTE — Telephone Encounter (Signed)
Refilled: 04/24/2018 Last OV: 03/17/2018 Next OV: not scheduled Last TSH: 07/25/2017

## 2018-08-19 DIAGNOSIS — I671 Cerebral aneurysm, nonruptured: Secondary | ICD-10-CM | POA: Diagnosis not present

## 2018-08-19 DIAGNOSIS — Z87891 Personal history of nicotine dependence: Secondary | ICD-10-CM | POA: Diagnosis not present

## 2018-08-19 DIAGNOSIS — I10 Essential (primary) hypertension: Secondary | ICD-10-CM | POA: Diagnosis not present

## 2018-08-19 DIAGNOSIS — I5030 Unspecified diastolic (congestive) heart failure: Secondary | ICD-10-CM | POA: Diagnosis not present

## 2018-08-19 DIAGNOSIS — E669 Obesity, unspecified: Secondary | ICD-10-CM | POA: Diagnosis not present

## 2018-08-19 DIAGNOSIS — E7800 Pure hypercholesterolemia, unspecified: Secondary | ICD-10-CM | POA: Diagnosis not present

## 2018-08-19 DIAGNOSIS — G4730 Sleep apnea, unspecified: Secondary | ICD-10-CM | POA: Diagnosis not present

## 2018-08-19 DIAGNOSIS — C50919 Malignant neoplasm of unspecified site of unspecified female breast: Secondary | ICD-10-CM | POA: Diagnosis not present

## 2018-08-19 DIAGNOSIS — N189 Chronic kidney disease, unspecified: Secondary | ICD-10-CM | POA: Diagnosis not present

## 2018-09-08 DIAGNOSIS — G4700 Insomnia, unspecified: Secondary | ICD-10-CM | POA: Diagnosis not present

## 2018-09-08 DIAGNOSIS — F5101 Primary insomnia: Secondary | ICD-10-CM | POA: Diagnosis not present

## 2018-09-08 DIAGNOSIS — E668 Other obesity: Secondary | ICD-10-CM | POA: Diagnosis not present

## 2018-09-08 DIAGNOSIS — Z6833 Body mass index (BMI) 33.0-33.9, adult: Secondary | ICD-10-CM | POA: Diagnosis not present

## 2018-09-08 DIAGNOSIS — M6281 Muscle weakness (generalized): Secondary | ICD-10-CM | POA: Diagnosis not present

## 2018-09-08 DIAGNOSIS — G2581 Restless legs syndrome: Secondary | ICD-10-CM | POA: Diagnosis not present

## 2018-09-08 DIAGNOSIS — G4733 Obstructive sleep apnea (adult) (pediatric): Secondary | ICD-10-CM | POA: Diagnosis not present

## 2018-09-08 DIAGNOSIS — Z23 Encounter for immunization: Secondary | ICD-10-CM | POA: Diagnosis not present

## 2018-09-11 DIAGNOSIS — I129 Hypertensive chronic kidney disease with stage 1 through stage 4 chronic kidney disease, or unspecified chronic kidney disease: Secondary | ICD-10-CM | POA: Diagnosis not present

## 2018-09-11 DIAGNOSIS — N184 Chronic kidney disease, stage 4 (severe): Secondary | ICD-10-CM | POA: Diagnosis not present

## 2018-09-19 ENCOUNTER — Other Ambulatory Visit: Payer: Self-pay | Admitting: Cardiovascular Disease

## 2018-09-24 DIAGNOSIS — M25476 Effusion, unspecified foot: Secondary | ICD-10-CM | POA: Diagnosis not present

## 2018-09-24 DIAGNOSIS — F329 Major depressive disorder, single episode, unspecified: Secondary | ICD-10-CM | POA: Diagnosis not present

## 2018-09-24 DIAGNOSIS — M25473 Effusion, unspecified ankle: Secondary | ICD-10-CM | POA: Diagnosis not present

## 2018-09-24 DIAGNOSIS — G4730 Sleep apnea, unspecified: Secondary | ICD-10-CM | POA: Diagnosis not present

## 2018-09-24 DIAGNOSIS — Z6834 Body mass index (BMI) 34.0-34.9, adult: Secondary | ICD-10-CM | POA: Diagnosis not present

## 2018-09-28 DIAGNOSIS — N189 Chronic kidney disease, unspecified: Secondary | ICD-10-CM | POA: Diagnosis not present

## 2018-09-28 DIAGNOSIS — G4730 Sleep apnea, unspecified: Secondary | ICD-10-CM | POA: Diagnosis not present

## 2018-09-28 DIAGNOSIS — I5030 Unspecified diastolic (congestive) heart failure: Secondary | ICD-10-CM | POA: Diagnosis not present

## 2018-09-28 DIAGNOSIS — E669 Obesity, unspecified: Secondary | ICD-10-CM | POA: Diagnosis not present

## 2018-09-28 DIAGNOSIS — Z87891 Personal history of nicotine dependence: Secondary | ICD-10-CM | POA: Diagnosis not present

## 2018-09-28 DIAGNOSIS — I10 Essential (primary) hypertension: Secondary | ICD-10-CM | POA: Diagnosis not present

## 2018-09-28 DIAGNOSIS — I671 Cerebral aneurysm, nonruptured: Secondary | ICD-10-CM | POA: Diagnosis not present

## 2018-09-28 DIAGNOSIS — E7800 Pure hypercholesterolemia, unspecified: Secondary | ICD-10-CM | POA: Diagnosis not present

## 2018-09-28 DIAGNOSIS — C50919 Malignant neoplasm of unspecified site of unspecified female breast: Secondary | ICD-10-CM | POA: Diagnosis not present

## 2018-10-08 ENCOUNTER — Telehealth: Payer: Self-pay | Admitting: Hematology & Oncology

## 2018-10-08 DIAGNOSIS — Z853 Personal history of malignant neoplasm of breast: Secondary | ICD-10-CM | POA: Diagnosis not present

## 2018-10-08 DIAGNOSIS — C50412 Malignant neoplasm of upper-outer quadrant of left female breast: Secondary | ICD-10-CM | POA: Diagnosis not present

## 2018-10-08 DIAGNOSIS — Z85528 Personal history of other malignant neoplasm of kidney: Secondary | ICD-10-CM | POA: Diagnosis not present

## 2018-10-08 DIAGNOSIS — N182 Chronic kidney disease, stage 2 (mild): Secondary | ICD-10-CM | POA: Diagnosis not present

## 2018-10-08 DIAGNOSIS — I10 Essential (primary) hypertension: Secondary | ICD-10-CM | POA: Diagnosis not present

## 2018-10-08 DIAGNOSIS — D539 Nutritional anemia, unspecified: Secondary | ICD-10-CM | POA: Diagnosis not present

## 2018-10-08 DIAGNOSIS — D649 Anemia, unspecified: Secondary | ICD-10-CM | POA: Diagnosis not present

## 2018-10-08 DIAGNOSIS — I671 Cerebral aneurysm, nonruptured: Secondary | ICD-10-CM | POA: Diagnosis not present

## 2018-10-08 DIAGNOSIS — E039 Hypothyroidism, unspecified: Secondary | ICD-10-CM | POA: Diagnosis not present

## 2018-10-08 DIAGNOSIS — I5032 Chronic diastolic (congestive) heart failure: Secondary | ICD-10-CM | POA: Diagnosis not present

## 2018-10-08 DIAGNOSIS — E785 Hyperlipidemia, unspecified: Secondary | ICD-10-CM | POA: Diagnosis not present

## 2018-10-08 DIAGNOSIS — G4730 Sleep apnea, unspecified: Secondary | ICD-10-CM | POA: Diagnosis not present

## 2018-10-08 DIAGNOSIS — Z9889 Other specified postprocedural states: Secondary | ICD-10-CM | POA: Diagnosis not present

## 2018-10-08 NOTE — Telephone Encounter (Signed)
Faxed medical records to: [facility] Address: [address] [place], [place] [address]  P: [phone] F: [phone]

## 2018-10-15 DIAGNOSIS — F5101 Primary insomnia: Secondary | ICD-10-CM | POA: Diagnosis not present

## 2018-10-15 DIAGNOSIS — E668 Other obesity: Secondary | ICD-10-CM | POA: Diagnosis not present

## 2018-10-15 DIAGNOSIS — G4733 Obstructive sleep apnea (adult) (pediatric): Secondary | ICD-10-CM | POA: Diagnosis not present

## 2018-10-24 ENCOUNTER — Other Ambulatory Visit: Payer: Self-pay | Admitting: Hematology & Oncology

## 2018-10-24 DIAGNOSIS — C50912 Malignant neoplasm of unspecified site of left female breast: Secondary | ICD-10-CM

## 2018-10-24 DIAGNOSIS — Z170 Estrogen receptor positive status [ER+]: Principal | ICD-10-CM

## 2018-10-26 ENCOUNTER — Other Ambulatory Visit: Payer: Self-pay | Admitting: Internal Medicine

## 2018-10-26 DIAGNOSIS — E039 Hypothyroidism, unspecified: Secondary | ICD-10-CM

## 2018-10-26 NOTE — Telephone Encounter (Signed)
Refilled: 07/22/2018 Last OV: 03/17/2018 Next OV: not scheduled Last TSH: 07/25/2017

## 2018-11-04 DIAGNOSIS — I872 Venous insufficiency (chronic) (peripheral): Secondary | ICD-10-CM | POA: Diagnosis not present

## 2018-11-10 DIAGNOSIS — E669 Obesity, unspecified: Secondary | ICD-10-CM | POA: Diagnosis not present

## 2018-11-10 DIAGNOSIS — N189 Chronic kidney disease, unspecified: Secondary | ICD-10-CM | POA: Diagnosis not present

## 2018-11-10 DIAGNOSIS — G4730 Sleep apnea, unspecified: Secondary | ICD-10-CM | POA: Diagnosis not present

## 2018-11-10 DIAGNOSIS — Z87891 Personal history of nicotine dependence: Secondary | ICD-10-CM | POA: Diagnosis not present

## 2018-11-10 DIAGNOSIS — C50919 Malignant neoplasm of unspecified site of unspecified female breast: Secondary | ICD-10-CM | POA: Diagnosis not present

## 2018-11-10 DIAGNOSIS — I10 Essential (primary) hypertension: Secondary | ICD-10-CM | POA: Diagnosis not present

## 2018-11-10 DIAGNOSIS — I5030 Unspecified diastolic (congestive) heart failure: Secondary | ICD-10-CM | POA: Diagnosis not present

## 2018-11-10 DIAGNOSIS — I671 Cerebral aneurysm, nonruptured: Secondary | ICD-10-CM | POA: Diagnosis not present

## 2018-11-10 DIAGNOSIS — E7800 Pure hypercholesterolemia, unspecified: Secondary | ICD-10-CM | POA: Diagnosis not present

## 2018-11-19 ENCOUNTER — Ambulatory Visit: Payer: Medicare Other | Attending: Radiation Oncology | Admitting: Radiation Oncology

## 2018-11-20 ENCOUNTER — Other Ambulatory Visit: Payer: Self-pay | Admitting: Internal Medicine

## 2018-11-23 ENCOUNTER — Ambulatory Visit: Payer: Medicare Other

## 2018-11-24 DIAGNOSIS — Z6833 Body mass index (BMI) 33.0-33.9, adult: Secondary | ICD-10-CM | POA: Diagnosis not present

## 2018-11-24 DIAGNOSIS — G4700 Insomnia, unspecified: Secondary | ICD-10-CM | POA: Diagnosis not present

## 2018-11-24 DIAGNOSIS — R110 Nausea: Secondary | ICD-10-CM | POA: Diagnosis not present

## 2018-11-24 DIAGNOSIS — J449 Chronic obstructive pulmonary disease, unspecified: Secondary | ICD-10-CM | POA: Diagnosis not present

## 2018-12-14 DIAGNOSIS — G4733 Obstructive sleep apnea (adult) (pediatric): Secondary | ICD-10-CM | POA: Diagnosis not present

## 2018-12-14 DIAGNOSIS — I10 Essential (primary) hypertension: Secondary | ICD-10-CM | POA: Diagnosis not present

## 2018-12-14 DIAGNOSIS — F5101 Primary insomnia: Secondary | ICD-10-CM | POA: Diagnosis not present

## 2018-12-14 DIAGNOSIS — E668 Other obesity: Secondary | ICD-10-CM | POA: Diagnosis not present

## 2018-12-14 DIAGNOSIS — J449 Chronic obstructive pulmonary disease, unspecified: Secondary | ICD-10-CM | POA: Diagnosis not present

## 2018-12-14 DIAGNOSIS — G4700 Insomnia, unspecified: Secondary | ICD-10-CM | POA: Diagnosis not present

## 2018-12-14 DIAGNOSIS — Z982 Presence of cerebrospinal fluid drainage device: Secondary | ICD-10-CM | POA: Diagnosis not present

## 2018-12-14 DIAGNOSIS — Z6833 Body mass index (BMI) 33.0-33.9, adult: Secondary | ICD-10-CM | POA: Diagnosis not present

## 2018-12-31 DIAGNOSIS — N281 Cyst of kidney, acquired: Secondary | ICD-10-CM | POA: Diagnosis not present

## 2018-12-31 DIAGNOSIS — N184 Chronic kidney disease, stage 4 (severe): Secondary | ICD-10-CM | POA: Diagnosis not present

## 2018-12-31 DIAGNOSIS — I129 Hypertensive chronic kidney disease with stage 1 through stage 4 chronic kidney disease, or unspecified chronic kidney disease: Secondary | ICD-10-CM | POA: Diagnosis not present

## 2019-01-06 ENCOUNTER — Other Ambulatory Visit: Payer: Self-pay | Admitting: Internal Medicine

## 2019-01-15 DIAGNOSIS — N184 Chronic kidney disease, stage 4 (severe): Secondary | ICD-10-CM | POA: Diagnosis not present

## 2019-01-15 DIAGNOSIS — I5032 Chronic diastolic (congestive) heart failure: Secondary | ICD-10-CM | POA: Diagnosis not present

## 2019-01-15 DIAGNOSIS — D508 Other iron deficiency anemias: Secondary | ICD-10-CM | POA: Diagnosis not present

## 2019-01-15 DIAGNOSIS — E785 Hyperlipidemia, unspecified: Secondary | ICD-10-CM | POA: Diagnosis not present

## 2019-01-15 DIAGNOSIS — D539 Nutritional anemia, unspecified: Secondary | ICD-10-CM | POA: Diagnosis not present

## 2019-01-15 DIAGNOSIS — E039 Hypothyroidism, unspecified: Secondary | ICD-10-CM | POA: Diagnosis not present

## 2019-01-15 DIAGNOSIS — Z9889 Other specified postprocedural states: Secondary | ICD-10-CM | POA: Diagnosis not present

## 2019-01-15 DIAGNOSIS — Z853 Personal history of malignant neoplasm of breast: Secondary | ICD-10-CM | POA: Diagnosis not present

## 2019-01-15 DIAGNOSIS — I671 Cerebral aneurysm, nonruptured: Secondary | ICD-10-CM | POA: Diagnosis not present

## 2019-01-15 DIAGNOSIS — C50412 Malignant neoplasm of upper-outer quadrant of left female breast: Secondary | ICD-10-CM | POA: Diagnosis not present

## 2019-01-15 DIAGNOSIS — I10 Essential (primary) hypertension: Secondary | ICD-10-CM | POA: Diagnosis not present

## 2019-01-15 DIAGNOSIS — G4730 Sleep apnea, unspecified: Secondary | ICD-10-CM | POA: Diagnosis not present

## 2019-01-15 DIAGNOSIS — N182 Chronic kidney disease, stage 2 (mild): Secondary | ICD-10-CM | POA: Diagnosis not present

## 2019-01-15 DIAGNOSIS — Z85528 Personal history of other malignant neoplasm of kidney: Secondary | ICD-10-CM | POA: Diagnosis not present

## 2019-01-22 DIAGNOSIS — E039 Hypothyroidism, unspecified: Secondary | ICD-10-CM | POA: Diagnosis not present

## 2019-01-22 DIAGNOSIS — Z853 Personal history of malignant neoplasm of breast: Secondary | ICD-10-CM | POA: Diagnosis not present

## 2019-01-22 DIAGNOSIS — C50412 Malignant neoplasm of upper-outer quadrant of left female breast: Secondary | ICD-10-CM | POA: Diagnosis not present

## 2019-01-22 DIAGNOSIS — G4730 Sleep apnea, unspecified: Secondary | ICD-10-CM | POA: Diagnosis not present

## 2019-01-22 DIAGNOSIS — I5032 Chronic diastolic (congestive) heart failure: Secondary | ICD-10-CM | POA: Diagnosis not present

## 2019-01-22 DIAGNOSIS — N182 Chronic kidney disease, stage 2 (mild): Secondary | ICD-10-CM | POA: Diagnosis not present

## 2019-01-22 DIAGNOSIS — I10 Essential (primary) hypertension: Secondary | ICD-10-CM | POA: Diagnosis not present

## 2019-01-22 DIAGNOSIS — Z85528 Personal history of other malignant neoplasm of kidney: Secondary | ICD-10-CM | POA: Diagnosis not present

## 2019-01-22 DIAGNOSIS — D539 Nutritional anemia, unspecified: Secondary | ICD-10-CM | POA: Diagnosis not present

## 2019-01-22 DIAGNOSIS — D508 Other iron deficiency anemias: Secondary | ICD-10-CM | POA: Diagnosis not present

## 2019-01-22 DIAGNOSIS — I671 Cerebral aneurysm, nonruptured: Secondary | ICD-10-CM | POA: Diagnosis not present

## 2019-01-22 DIAGNOSIS — N184 Chronic kidney disease, stage 4 (severe): Secondary | ICD-10-CM | POA: Diagnosis not present

## 2019-01-27 ENCOUNTER — Other Ambulatory Visit: Payer: Self-pay | Admitting: Cardiovascular Disease

## 2019-01-28 ENCOUNTER — Telehealth: Payer: Self-pay | Admitting: Cardiovascular Disease

## 2019-01-28 DIAGNOSIS — N184 Chronic kidney disease, stage 4 (severe): Secondary | ICD-10-CM | POA: Diagnosis not present

## 2019-01-28 NOTE — Telephone Encounter (Signed)
-----   Message from Janan Ridge, [place] sent at 01/28/2019  8:37 AM EST ----- Regarding: Appointment for refills Patient needs an appointment for further refills. If patient does not want to schedule an appointment please make them aware to contact PCP for refills. I have sent in enough medication until appointment can be made.   Thanks Ladies!

## 2019-01-28 NOTE — Telephone Encounter (Signed)
Spoke with patient and patient has moved to [place] and seeing a new cardiologist  Recall deleted

## 2019-01-29 DIAGNOSIS — E039 Hypothyroidism, unspecified: Secondary | ICD-10-CM | POA: Diagnosis not present

## 2019-01-29 DIAGNOSIS — I5032 Chronic diastolic (congestive) heart failure: Secondary | ICD-10-CM | POA: Diagnosis not present

## 2019-01-29 DIAGNOSIS — D508 Other iron deficiency anemias: Secondary | ICD-10-CM | POA: Diagnosis not present

## 2019-01-29 DIAGNOSIS — N184 Chronic kidney disease, stage 4 (severe): Secondary | ICD-10-CM | POA: Diagnosis not present

## 2019-01-29 DIAGNOSIS — N182 Chronic kidney disease, stage 2 (mild): Secondary | ICD-10-CM | POA: Diagnosis not present

## 2019-01-29 DIAGNOSIS — D539 Nutritional anemia, unspecified: Secondary | ICD-10-CM | POA: Diagnosis not present

## 2019-01-29 DIAGNOSIS — I10 Essential (primary) hypertension: Secondary | ICD-10-CM | POA: Diagnosis not present

## 2019-01-29 DIAGNOSIS — G4730 Sleep apnea, unspecified: Secondary | ICD-10-CM | POA: Diagnosis not present

## 2019-01-29 DIAGNOSIS — Z853 Personal history of malignant neoplasm of breast: Secondary | ICD-10-CM | POA: Diagnosis not present

## 2019-01-29 DIAGNOSIS — E785 Hyperlipidemia, unspecified: Secondary | ICD-10-CM | POA: Diagnosis not present

## 2019-01-29 DIAGNOSIS — I671 Cerebral aneurysm, nonruptured: Secondary | ICD-10-CM | POA: Diagnosis not present

## 2019-01-29 DIAGNOSIS — C50412 Malignant neoplasm of upper-outer quadrant of left female breast: Secondary | ICD-10-CM | POA: Diagnosis not present

## 2019-02-03 ENCOUNTER — Other Ambulatory Visit: Payer: Self-pay | Admitting: Internal Medicine

## 2019-02-04 DIAGNOSIS — C50412 Malignant neoplasm of upper-outer quadrant of left female breast: Secondary | ICD-10-CM | POA: Diagnosis not present

## 2019-02-05 DIAGNOSIS — C50412 Malignant neoplasm of upper-outer quadrant of left female breast: Secondary | ICD-10-CM | POA: Diagnosis not present

## 2019-02-05 DIAGNOSIS — D508 Other iron deficiency anemias: Secondary | ICD-10-CM | POA: Diagnosis not present

## 2019-02-05 DIAGNOSIS — D539 Nutritional anemia, unspecified: Secondary | ICD-10-CM | POA: Diagnosis not present

## 2019-02-05 DIAGNOSIS — Z853 Personal history of malignant neoplasm of breast: Secondary | ICD-10-CM | POA: Diagnosis not present

## 2019-02-05 DIAGNOSIS — E039 Hypothyroidism, unspecified: Secondary | ICD-10-CM | POA: Diagnosis not present

## 2019-02-05 DIAGNOSIS — G4730 Sleep apnea, unspecified: Secondary | ICD-10-CM | POA: Diagnosis not present

## 2019-02-05 DIAGNOSIS — N182 Chronic kidney disease, stage 2 (mild): Secondary | ICD-10-CM | POA: Diagnosis not present

## 2019-02-05 DIAGNOSIS — I10 Essential (primary) hypertension: Secondary | ICD-10-CM | POA: Diagnosis not present

## 2019-02-12 DIAGNOSIS — Z5111 Encounter for antineoplastic chemotherapy: Secondary | ICD-10-CM | POA: Diagnosis not present

## 2019-02-12 DIAGNOSIS — I5032 Chronic diastolic (congestive) heart failure: Secondary | ICD-10-CM | POA: Diagnosis not present

## 2019-02-12 DIAGNOSIS — G4730 Sleep apnea, unspecified: Secondary | ICD-10-CM | POA: Diagnosis not present

## 2019-02-12 DIAGNOSIS — E039 Hypothyroidism, unspecified: Secondary | ICD-10-CM | POA: Diagnosis not present

## 2019-02-12 DIAGNOSIS — I10 Essential (primary) hypertension: Secondary | ICD-10-CM | POA: Diagnosis not present

## 2019-02-12 DIAGNOSIS — C50412 Malignant neoplasm of upper-outer quadrant of left female breast: Secondary | ICD-10-CM | POA: Diagnosis not present

## 2019-02-12 DIAGNOSIS — N184 Chronic kidney disease, stage 4 (severe): Secondary | ICD-10-CM | POA: Diagnosis not present

## 2019-02-12 DIAGNOSIS — N182 Chronic kidney disease, stage 2 (mild): Secondary | ICD-10-CM | POA: Diagnosis not present

## 2019-02-12 DIAGNOSIS — D508 Other iron deficiency anemias: Secondary | ICD-10-CM | POA: Diagnosis not present

## 2019-02-12 DIAGNOSIS — I671 Cerebral aneurysm, nonruptured: Secondary | ICD-10-CM | POA: Diagnosis not present

## 2019-02-12 DIAGNOSIS — E785 Hyperlipidemia, unspecified: Secondary | ICD-10-CM | POA: Diagnosis not present

## 2019-02-12 DIAGNOSIS — D539 Nutritional anemia, unspecified: Secondary | ICD-10-CM | POA: Diagnosis not present

## 2019-02-18 DIAGNOSIS — R112 Nausea with vomiting, unspecified: Secondary | ICD-10-CM | POA: Diagnosis not present

## 2019-02-18 DIAGNOSIS — K635 Polyp of colon: Secondary | ICD-10-CM | POA: Diagnosis not present

## 2019-02-18 DIAGNOSIS — K219 Gastro-esophageal reflux disease without esophagitis: Secondary | ICD-10-CM | POA: Diagnosis not present

## 2019-02-18 DIAGNOSIS — K2270 Barrett's esophagus without dysplasia: Secondary | ICD-10-CM | POA: Diagnosis not present

## 2019-02-18 DIAGNOSIS — I10 Essential (primary) hypertension: Secondary | ICD-10-CM | POA: Diagnosis not present

## 2019-02-19 DIAGNOSIS — Z5111 Encounter for antineoplastic chemotherapy: Secondary | ICD-10-CM | POA: Diagnosis not present

## 2019-02-19 DIAGNOSIS — D508 Other iron deficiency anemias: Secondary | ICD-10-CM | POA: Diagnosis not present

## 2019-02-19 DIAGNOSIS — E039 Hypothyroidism, unspecified: Secondary | ICD-10-CM | POA: Diagnosis not present

## 2019-02-19 DIAGNOSIS — D539 Nutritional anemia, unspecified: Secondary | ICD-10-CM | POA: Diagnosis not present

## 2019-02-19 DIAGNOSIS — N182 Chronic kidney disease, stage 2 (mild): Secondary | ICD-10-CM | POA: Diagnosis not present

## 2019-02-19 DIAGNOSIS — Z853 Personal history of malignant neoplasm of breast: Secondary | ICD-10-CM | POA: Diagnosis not present

## 2019-02-19 DIAGNOSIS — N184 Chronic kidney disease, stage 4 (severe): Secondary | ICD-10-CM | POA: Diagnosis not present

## 2019-02-19 DIAGNOSIS — E785 Hyperlipidemia, unspecified: Secondary | ICD-10-CM | POA: Diagnosis not present

## 2019-02-19 DIAGNOSIS — Z85528 Personal history of other malignant neoplasm of kidney: Secondary | ICD-10-CM | POA: Diagnosis not present

## 2019-02-19 DIAGNOSIS — I10 Essential (primary) hypertension: Secondary | ICD-10-CM | POA: Diagnosis not present

## 2019-02-19 DIAGNOSIS — C50412 Malignant neoplasm of upper-outer quadrant of left female breast: Secondary | ICD-10-CM | POA: Diagnosis not present

## 2019-02-22 DIAGNOSIS — K209 Esophagitis, unspecified: Secondary | ICD-10-CM | POA: Diagnosis not present

## 2019-02-22 DIAGNOSIS — K219 Gastro-esophageal reflux disease without esophagitis: Secondary | ICD-10-CM | POA: Diagnosis not present

## 2019-02-22 DIAGNOSIS — Z800 Family history of malignant neoplasm of digestive organs: Secondary | ICD-10-CM | POA: Diagnosis not present

## 2019-02-22 DIAGNOSIS — Z8719 Personal history of other diseases of the digestive system: Secondary | ICD-10-CM | POA: Diagnosis not present

## 2019-02-22 DIAGNOSIS — K2270 Barrett's esophagus without dysplasia: Secondary | ICD-10-CM | POA: Diagnosis not present

## 2019-03-14 ENCOUNTER — Other Ambulatory Visit: Payer: Self-pay | Admitting: Cardiovascular Disease

## 2019-03-15 ENCOUNTER — Telehealth: Payer: Self-pay

## 2019-03-15 NOTE — Telephone Encounter (Signed)
Spoke with the patient's husband to clarify that they had relocated to [place] and would no longer follow with [facility]. According to Mr. Beed they had established with a cardiologist in [place] and would contact that office for refills.

## 2019-05-05 ENCOUNTER — Other Ambulatory Visit: Payer: Self-pay | Admitting: Cardiovascular Disease

## 2019-05-13 ENCOUNTER — Other Ambulatory Visit: Payer: Self-pay | Admitting: Internal Medicine

## 2023-07-01 ENCOUNTER — Encounter (HOSPITAL_COMMUNITY): Payer: Self-pay

## 2023-08-19 ENCOUNTER — Other Ambulatory Visit: Payer: Self-pay | Admitting: Pharmacist

## 2024-01-10 DEATH — deceased
